# Patient Record
Sex: Male | Born: 2000 | Race: White | Hispanic: No | Marital: Single | Smoking: Never smoker
Health system: Southern US, Community
[De-identification: ages and names within clinical notes are randomized; demographics above are authoritative.]

## PROBLEM LIST (undated history)

## (undated) ENCOUNTER — Emergency Department: Admission: EM

## (undated) DIAGNOSIS — R404 Transient alteration of awareness: Secondary | ICD-10-CM

## (undated) DIAGNOSIS — R569 Unspecified convulsions: Secondary | ICD-10-CM

## (undated) DIAGNOSIS — Z8489 Family history of other specified conditions: Secondary | ICD-10-CM

## (undated) DIAGNOSIS — M419 Scoliosis, unspecified: Secondary | ICD-10-CM

## (undated) DIAGNOSIS — F840 Autistic disorder: Secondary | ICD-10-CM

## (undated) DIAGNOSIS — G7100 Muscular dystrophy, unspecified: Secondary | ICD-10-CM

## (undated) DIAGNOSIS — A084 Viral intestinal infection, unspecified: Secondary | ICD-10-CM

## (undated) HISTORY — DX: Viral intestinal infection, unspecified: A08.4

## (undated) HISTORY — DX: Unspecified convulsions: R56.9

## (undated) HISTORY — DX: Transient alteration of awareness: R40.4

## (undated) HISTORY — PX: SPINE SURGERY: SHX786

## (undated) HISTORY — PX: CIRCUMCISION: SUR203

---
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
## 2020-01-28 ENCOUNTER — Telehealth (INDEPENDENT_AMBULATORY_CARE_PROVIDER_SITE_OTHER): Payer: Self-pay | Admitting: Pediatrics

## 2020-01-28 NOTE — Telephone Encounter (Signed)
Who's calling (name and relationship to patient) : Edelmiro Innocent (mom)  Best contact number: [phone]  Provider they see: [name]  Reason for call:  Mom called in stating that a letter needed to be filled out and signed by [name] herself to excuse Madison from in person schooling due to him being unable to wear a mask. Per school has to come from [place]. Please fax when completed to [employer] at [phone]  Call ID:      [place]  Name of prescription:  Pharmacy:

## 2020-01-28 NOTE — Telephone Encounter (Signed)
There is no medical indication that prevents him from wearing a mask, but he may behaviorally not tolerate it.  Please call mother to clarify why he is unable to wear a mask. He is high risk for complications from COVID, I recommend he get the COVID vaccine.  If there are further questions, I can speak with mother directly.   Carylon Perches MD MPH

## 2020-01-29 ENCOUNTER — Encounter (INDEPENDENT_AMBULATORY_CARE_PROVIDER_SITE_OTHER): Payer: Self-pay

## 2020-01-29 NOTE — Telephone Encounter (Signed)
I returned call as Judson Roch is out today.  No answer at best contact number, mailbox is full and will not accept messages.  I called other number in demographic information, no answer.  I left a message to please call us back with more information on why he can not wear a mask.    Carylon Perches MD MPH

## 2020-01-31 ENCOUNTER — Encounter (INDEPENDENT_AMBULATORY_CARE_PROVIDER_SITE_OTHER): Payer: Self-pay

## 2020-02-03 NOTE — Telephone Encounter (Signed)
Message sent to mother via mychart regarding this issue.     Lucas Perches MD MPH

## 2020-02-06 ENCOUNTER — Encounter (INDEPENDENT_AMBULATORY_CARE_PROVIDER_SITE_OTHER): Payer: Self-pay

## 2020-02-07 ENCOUNTER — Telehealth (INDEPENDENT_AMBULATORY_CARE_PROVIDER_SITE_OTHER): Payer: Self-pay

## 2020-02-07 ENCOUNTER — Encounter (INDEPENDENT_AMBULATORY_CARE_PROVIDER_SITE_OTHER): Payer: Self-pay

## 2020-02-07 NOTE — Telephone Encounter (Signed)
Return call to [name] from [facility]. She reports she does not need letter for homebound just a letter stating that due to Tidus's developmental delays he cannot understand to leave his mask on while at school. She also reports to include he has an increased risk of complications if he gets the Covid 19 virus due to his muscular dystrophy. It needs to be faxed to Katie attention Pollyann Glen his teacher.  Mom reports she hopes to be able to get the vaccine herself this summer. RN advised due to the fact that she is his caregiver she should be able to get the vaccine now. [name] also mentioned he should be able to get it as well since he is now 30 and has a complex medical history. Mom will contact health dept to find out if they can. She denies receiving a call about getting xray taken. RN will re-enter order and call them to schedule. And notify mom. Mom agrees with plan.

## 2020-02-10 ENCOUNTER — Telehealth (INDEPENDENT_AMBULATORY_CARE_PROVIDER_SITE_OTHER): Payer: Self-pay | Admitting: Pediatrics

## 2020-02-10 NOTE — Telephone Encounter (Signed)
Yes that is fine

## 2020-02-10 NOTE — Telephone Encounter (Signed)
Called and left a message approving the authorization

## 2020-02-10 NOTE — Telephone Encounter (Signed)
  Who's calling (name and relationship to patient) : [facility]   Best contact number: [phone]  Provider they see: Dr. Gaynell Face  Reason for call: Pharmacy called to receive an authorization for a change in the Rx carbamazepine manufacturer. Fax authorization to [phone]    PRESCRIPTION REFILL ONLY  Name of prescription: Carbamazepine  Pharmacy: [facility] [employer].

## 2020-02-18 ENCOUNTER — Telehealth (INDEPENDENT_AMBULATORY_CARE_PROVIDER_SITE_OTHER): Payer: Self-pay

## 2020-02-18 ENCOUNTER — Encounter (INDEPENDENT_AMBULATORY_CARE_PROVIDER_SITE_OTHER): Payer: Self-pay

## 2020-02-18 DIAGNOSIS — M4145 Neuromuscular scoliosis, thoracolumbar region: Secondary | ICD-10-CM

## 2020-02-18 DIAGNOSIS — G7101 Duchenne or Becker muscular dystrophy: Secondary | ICD-10-CM

## 2020-02-18 DIAGNOSIS — M4120 Other idiopathic scoliosis, site unspecified: Secondary | ICD-10-CM

## 2020-02-18 DIAGNOSIS — F840 Autistic disorder: Secondary | ICD-10-CM

## 2020-02-18 NOTE — Telephone Encounter (Signed)
Left message for Lucas [name] to determine if she would rather his spinal Xrays be at [facility] on [address] or [facility] on [address] [place], [place], [place] [address]

## 2020-02-26 NOTE — Progress Notes (Shared)
Patient: Lucas [name] MRN: [id number] Sex: male DOB: 10-Mar-2001  Provider: [name], MD Location of Care: Pediatric Specialist- Pediatric Complex Care Note type: Routine return visit  History of Present Illness: Referral Source: *** History from: patient and prior records Chief Complaint: ***  Lucas [name] is a 19 y.o. 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 Patient was last seen on 10/14/2019 where his carbamazepine and also for Diastat were continued  .  Since that appointment, patient has seen Cardiology and Pulmonology.   Patient presents today with {CHL AMB PARENT/GUARDIAN:210130214} They report their largest concern is ***  Symptom management:     Care coordination (other providers):  Care management needs:   Equipment needs:   Decision making/Advanced care planning:  Diagnostics/Patient history:   Review of Systems: {cn system review:210120003}  Past Medical History Past Medical History:  Diagnosis Date  . Autism   . Autism   . Family history of adverse reaction to anesthesia    MGGM- N/V  . Muscular dystrophy (HCC)   . Scoliosis   . Seizure (HCC)   . Seizure (HCC)   . Seizures (HCC)    Last one 2013    Surgical History Past Surgical History:  Procedure Laterality Date  . CIRCUMCISION     at birth  . LAPAROSCOPIC GASTROSTOMY N/A 05/24/2017   Procedure: LAPAROSCOPIC GASTROSTOMY TUBE PLACEMENT;  Surgeon: Kandice Hams, MD;  Location: [name];  Service: General;  Laterality: N/A;    Family History family history includes Alcohol abuse in his father; Asthma in an other family member; Cancer in his paternal grandfather; Dementia in his paternal grandmother; Heart failure in his maternal  grandmother; Hyperlipidemia in his mother and another family member; Hypertension in his maternal grandmother and another family member; Learning disabilities in his mother; Other in his maternal aunt, maternal grandmother, maternal uncle, and mother; Vision loss in an other family member.   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 His private duty nurse attends school with him.   He attends [employer].    He lives with mother.    Allergies Allergies  Allergen Reactions  . Penicillins Hives and Rash    Has patient had a PCN reaction causing immediate rash, facial/tongue/throat swelling, SOB or lightheadedness with hypotension: Yes Has patient had a PCN reaction causing severe rash involving mucus membranes or skin necrosis: Yes Has patient had a PCN reaction that required hospitalization: No Has patient had a PCN reaction occurring within the last 10 years: No If all of the above answers are "NO", then may proceed with Cephalosporin use.   Marland Kitchen Nexium [Esomeprazole Magnesium] Nausea And Vomiting  . Calcitonin Rash  . Omeprazole Other (See Comments)    Constipation and possible rash after drug was stopped  . Vitamin D Analogs Other (See Comments)    Excessive urine output and thristy    Medications Current Outpatient Medications on File Prior to Visit  Medication Sig Dispense Refill  . acetaminophen (TYLENOL) 160 MG/5ML elixir Take 15 mg/kg by mouth every 4 (four) hours as needed for fever.    Marland Kitchen albuterol (PROVENTIL) (2.5 MG/3ML) 0.083% nebulizer solution Inhale into the lungs.    . calcium-vitamin D (OSCAL 500/200 D-3) 500-200 MG-UNIT tablet  Take by mouth.    . carbamazepine (TEGRETOL) 100 MG chewable tablet TAKE 1 & 1/2 TABLETS BY MOUTH TWICE DAILY 90 tablet 5  . diazepam (DIASTAT ACUDIAL) 10 MG GEL Give 10 mg rectally after 2 minutes of seizures.  May repeat in 10 minutes 1 Package 5  . diphenhydrAMINE (BENADRYL) 12.5  MG/5ML elixir 5 -10 ml po as needed for hives 3 times a day    . feeding supplement, PEDIASURE 1.0 CAL WITH FIBER, (PEDIASURE ENTERAL FORMULA 1.0 CAL WITH FIBER) LIQD Place 237 mLs into feeding tube 4 (four) times daily. If he has oral intake at school do not give a 4 pm feeding only give the water flushes if no oral intake give 4 feedings a day 155 mL 5  . hydrocortisone 2.5 % cream Apply topically.    Marland Kitchen loratadine (CHILDRENS LORATADINE) 5 MG/5ML syrup Take by mouth.    . Melatonin 5 MG TABS Take 7.5 mg by mouth at bedtime.    . Multiple Vitamin (MULTIVITAMIN) LIQD Take 30 mLs by mouth daily. 14ml Tropical oasis mega plus    . Polyethylene Glycol 3350 (PEG 3350) POWD TAKE 17 G BY MOUTH ONCE FOR 1 DOSE. MIX IN 4-8OUNCES OF FLUID PRIOR TO TAKING.    . prednisoLONE (PRELONE) 15 MG/5ML SOLN Take 9 mg by mouth daily before breakfast.    . senna (SENOKOT) 176 MG/5ML SYRP Give 5-10 ml by G tube every day to every other day to produce soft stool daily If no stool in 3 days give enema 300 mL 3   No current facility-administered medications on file prior to visit.   The medication list was reviewed and reconciled. All changes or newly prescribed medications were explained.  A complete medication list was provided to the patient/caregiver.  Physical Exam There were no vitals taken for this visit. Weight for age: No weight on file for this encounter.  Length for age: No height on file for this encounter. BMI: There is no height or weight on file to calculate BMI. No exam data present   Diagnosis: No diagnosis found.   Assessment and Plan Lucas [name] is a 19 y.o. male with history of ***who presents for follow-up in the pediatric complex care clinic.  Patient seen by case manager, dietician, integrated behavioral health today as well, please see accompanying notes.  I discussed case with all involved parties for coordination of care and recommend patient follow their instructions as below.    Symptom management:     Care coordination:  Care management needs:   Equipment needs:   Decision making/Advanced care planning:  The CARE PLAN for reviewed and revised to represent the changes above.  This is available in Epic under snapshot, and a physical binder provided to the patient, that can be used for anyone providing care for the patient.     No follow-ups on file.  Carylon Perches MD MPH Neurology,  Neurodevelopment and Neuropalliative care [facility] Child Neurology  [place], [place], [place] [address] Phone: [phone]  By signing below, I, Trina Ao attest that this documentation has been prepared under the direction of Carylon Perches, MD.   I, Carylon Perches, MD personally performed the services described in this documentation. All medical record entries made by the scribe were at my direction. I have reviewed the chart and agree that the record reflects my personal performance and is accurate and complete Electronically signed by Trina Ao and Carylon Perches, MD 3/18 /2021 ***

## 2020-02-26 NOTE — Progress Notes (Deleted)
   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 - [facility] DME: [facility] Pertinent medical hx: Duchenne musclar dystrophy, neuromuscular scoliosis, autism, seizures, FTT, +Gtube  Assessment: Food allergies: none Pertinent Medications: see medication list Vitamins/Supplements: Buiced liquid MVI Pertinent labs: no recent labs in Epic  (3/18) Anthropometrics: The child was weighed, measured, and plotted on the CDC growth chart. Ht: *** cm (*** %)  Z-score: *** Wt: *** kg (*** %)  Z-score: *** BMI: *** (*** %)  Z-score: ***  (7/3) Anthropometrics per Epic from outside source: The child was weighed, measured, and plotted on the CDC growth chart. Wt: 42.1 kg (0.01 %)  Z-score: -3.71  (3/20) Wt: 42.5 kg (2/17) Wt: 43.3 kg (11/21) Wt: 42.2 kg  Estimated minimum caloric needs: 25*** kcal/kg/day (based on current regimen) Estimated minimum protein needs: 0.85 g/kg/day (DRI) Estimated minimum fluid needs: 46*** mL/kg/day (Holliday Segar)  Primary concerns today: Follow-up for Gtube dependence. *** accompanied pt to appt today.  Dietary Intake Hx: Usual feeding regimen: Pediasure 1.0 + fiber - 4 cans daily with and additional 1000 mL free water  7-8 AM - 237 mL @ 237 mL/hr  10-11 AM - 237 mL @ 237 mL/hr  1 PM - 200 mL water  4-5 PM - 237 mL @ 237 mL/hr  7-8 PM - 237 mL @ 237 mL/hr  FWF: 100 mL before and after each feed (800 mL total) PO foods: none right now  GI: no vomiting, some spit up for attention (giggles), daily BM  Physical Activity: wheel-chair bound  Estimated caloric intake: *** kcal/kg/day - meets ***% of estimated needs Estimated protein intake: *** g/kg/day - meets ***% of estimated needs Estimated fluid intake: *** mL/kg/day - meets ***% of estimated needs Micronutrient intake: ***  Nutrition Diagnosis: (9/10) Inadequate oral intake related to medical status  and dysphagia as evidence by pt dependent on Gtube to meet nutritional needs.  Intervention: Discussed current regimen and weight. Mom reports pt is currently not consuming anything PO as he is not in school. She also reports thinking his weight looks stable and she has no concerns about weight loss. Discussed continuing current regimen for now and adjusting at next appt with updated anthros. Discussed MVI, PDN orders are for PRN so needs to be updated to daily. All questions answered, mom in agreement with plan. Recommendations: - Continue current regimen. - Monitor Toni's weight and let me know if it looks like he is losing weight. - We'll plan for an updated height and weight at next appointment. - [facility] has to send [place] a form to change his multivitamin to daily. Please reach out to them and let them know.  Teach back method used.  Monitoring/Evaluation: Goals to Monitor: - Wt trends - TF tolerance - Ability to increase protein or need to switch to lower calorie formula.  Follow-up ***.  Total time spent in counseling: *** minutes.

## 2020-02-27 ENCOUNTER — Encounter (INDEPENDENT_AMBULATORY_CARE_PROVIDER_SITE_OTHER): Payer: Self-pay

## 2020-02-27 ENCOUNTER — Ambulatory Visit (INDEPENDENT_AMBULATORY_CARE_PROVIDER_SITE_OTHER): Payer: Medicaid Other | Admitting: Pediatrics

## 2020-02-27 ENCOUNTER — Ambulatory Visit (INDEPENDENT_AMBULATORY_CARE_PROVIDER_SITE_OTHER): Payer: Medicaid Other | Admitting: Dietician

## 2020-02-28 ENCOUNTER — Telehealth (INDEPENDENT_AMBULATORY_CARE_PROVIDER_SITE_OTHER): Payer: Self-pay | Admitting: Dietician

## 2020-02-28 NOTE — Telephone Encounter (Signed)
  Who's calling (name and relationship to patient) : Olivia Mackie Ibbotson mom   Best contact number: [phone]  Provider they see: Jean Rosenthal  Reason for call: Mom called trying to move an appoint that is schedule for Monday as she has no transportation. Virtual visit was offered but she wasn't sure if Chavis needed to be seen in person or not. Please inform whether this is acceptable or not. Mom can only have transportation on Tuesday and Thursday mornings when [name] is in [facility] or [facility] appts.     PRESCRIPTION REFILL ONLY  Name of prescription:  Pharmacy:

## 2020-03-06 NOTE — Telephone Encounter (Signed)
Call to nursing agency to request they confirm that mom received the voice message and my chart messages RN has sent- My chart messages are returning unread. Advised about X-ray at the [facility] in [place], that she and Ryman should be able to receive the Covid vaccine now, and that he has an inpatient visit in our office on 4/8- she agrees will pass information to mom.

## 2020-03-16 NOTE — Progress Notes (Deleted)
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   Neck: Supple, no meningismus. No focal tenderness.  Resp: normal work of breathing.   CV: well perfused-05/11/18 - normal biventricular size and function  Abd: Abdomen non-distended. G-tube c/d/i. No hepatosplenomegaly or mass, history of reflux and constipation  Ext: No deformities, mod muscle wasting, ROM full.  MS: Awake, alert.  Nonverbal, but interactive with mother. Smiles responsively and appears to follow simple commands or to understand at times.    Cranial Nerves: No nystagmus; no ptsosis, face symmetric with full strength of facial muscles, hearing grossly intact, palate elevation is symmetric.  Motor- Able to move arms at least antigravity. No abnormal movements  Sensation: Responds to touch in all extremities.   Coordination: Does not reach for objects.   Gait: wheelchair dependent.    Neurologic - severe generalized weakness with wheelchair dependence, significant intellectual deficit, no language, dependent upon caregivers for all ADL's  Pulmonary - has sleep apnea, uses CPAP at night  GI - has dysphagia with gastrostomy tube dependence (14 fr 2.0 cm Mini-one) and purees or Gerber pouches by mouth for pleasure  Incontinent of stool and urine  Guardians/Caregivers: Lucas [name] (mother) ph [phone] [facility] nursing  Recent Events:  Upcoming Plans:   2 view Scoliosis X-rays for Dr. Joaquim [name] at [facility]- order faxed to [facility]  Needs follow up visit with Dr. [employer] [name] and follow up sleep study this summer  Cardiology follow-up 12-18 months, last 12/20 [facility] office [phone]; [place] office [phone])   Feeding: DME: [facility] - fax [phone] Formula: Pediasure 1.0 + Fiber - 4 cans daily Current regimen:  Day feeds: 237 mL @ 237 mL/hr x 4 feeds @ 7:30 AM, 10 AM, 4 PM, and 7 PM Overnight feeds: none  FWF: 100 mL before and after each feed (800 mL), 200 mL flush at 1 PM  Notes: no PO currently Supplements: Buiced liquid MVI  Past/failed meds:  Mom feels reflux medications caused vomiting .DOES NOT TOLERATE any reflux meds  Intolerant of Ca and Vitamin D supplements  Providers:  Lucas Late, MD,  ([facility]) ph [phone] fax [phone]  Lucas Jump, MD [facility] cardiology) ph [phone] fax [phone]-- upcoming  Lucas Crook, MD [facility] orthopedics) ph [phone] fax [phone]  Lucas Shell, MD [facility] pulmonology) ph [phone] fax [phone]  Lucas Gobble, MD (Neurology/Muscular dystrophy clinic at [facility]) ph [phone] fax [phone]  Lucas [name], [place] [facility] Dentistry) ph [phone] fax [phone]  Lucas [name], [place] ([facility] dietitian) ph [phone] fax [phone]  Lucas Patrick, MD (Pediatric Pulmonology at [facility]) [phone] Fax: [phone]  Community support/services:  Lucas [name] Physical Therapy ph [phone] Lucas [name], [place] Has PDN 84 hours per week Has respite hours - [place] [facility] Case Manager - [place]- ph [phone], fax [phone]  CAP/C  [place], RN -[phone]  CAP-C  ph [phone] fax [phone]  Lucas [name] and Lucas [name] (cousins)    Grandmother's old church members call sometimes.  Nurse interested in getting into rec center.    MDA society not helpful to mom  Equipment:  [place]- [phone] CPAP at 11,Nebulizer, [place] and Mobility-Lucas [name] [phone] [facility] lift, Sleep safe bed, Wheelchair new one ordered 05/2019, [place] chair may need knew one, car seat   [place]-Respiratory vest,Cough assist machine- BID- QID  [place] ([facility])-ph: [phone] fax: [phone] Incontinence supplies, Formula, Kangaroo, G-tube Mini-one Pediasure with Fiber 1.0, (contact - "Lucas [name]")  **Needs a van.  Currently lifting him into seat, then pushing wheelchair the van by a ramp.**  Goals of care:  do remote learning for safety  Advance care planning: Full Code    Psychosocial:  Father is not involved with the patient. Mom has cousin that helps with Lucas [name] care at times - [place], as well as their daughter Lucas [name].   Lives with mom and Attends [employer]  Diagnostics/Screenings:  EEG showed right central diphasic sharply contoured slow-wave activity.   MRI of the brain failed to show a structural abnormality.  Autism was diagnosed at age 45  Duchenne muscular dystrophy was diagnosed at age 44.  Polysomnogram October 2018 at [facility] - obstructive sleep apnea  EKG 05/11/18 - normal sinus rhythm with normal PR, QRS and corrected QT interval  Echocardiogram 05/11/18 - normal biventricular size and function  11/28/19 Echo- ECG: normal rate and rhythm, hear rate 95/minute is actually a bit lower than last 2 ECG's, favorable.Echocardiogram: normal chamber size, wall thickness, squeezing.  Rockwell Germany NP-C and Carylon Perches, MD Pediatric Complex Care Program Ph. [phone] Fax [phone]

## 2020-03-18 ENCOUNTER — Telehealth (INDEPENDENT_AMBULATORY_CARE_PROVIDER_SITE_OTHER): Payer: Self-pay | Admitting: Family

## 2020-03-18 NOTE — Telephone Encounter (Signed)
  Who's calling (name and relationship to patient) : Lucas [name] - mother   Best contact number: [phone]  Provider they see: Rockwell Germany / Dr Rogers Blocker   Reason for call: Mom called to speak with provider or nutrition about [name]'s pediasure. He is getting one can of Pediasure and stating he is still hungry after finishing one can. Please advise if the cans can be increased.     PRESCRIPTION REFILL ONLY  Name of prescription:  Pharmacy:

## 2020-03-19 ENCOUNTER — Encounter (INDEPENDENT_AMBULATORY_CARE_PROVIDER_SITE_OTHER): Payer: Self-pay | Admitting: Family

## 2020-03-19 ENCOUNTER — Ambulatory Visit (INDEPENDENT_AMBULATORY_CARE_PROVIDER_SITE_OTHER): Payer: Medicaid Other

## 2020-03-19 ENCOUNTER — Ambulatory Visit (INDEPENDENT_AMBULATORY_CARE_PROVIDER_SITE_OTHER): Payer: Medicaid Other | Admitting: Dietician

## 2020-03-19 ENCOUNTER — Ambulatory Visit (INDEPENDENT_AMBULATORY_CARE_PROVIDER_SITE_OTHER): Payer: Medicaid Other | Admitting: Family

## 2020-03-19 ENCOUNTER — Other Ambulatory Visit: Payer: Self-pay

## 2020-03-19 VITALS — BP 108/64 | HR 104 | Wt 86.8 lb

## 2020-03-19 VITALS — Ht 58.78 in

## 2020-03-19 DIAGNOSIS — G40209 Localization-related (focal) (partial) symptomatic epilepsy and epileptic syndromes with complex partial seizures, not intractable, without status epilepticus: Secondary | ICD-10-CM

## 2020-03-19 DIAGNOSIS — G7100 Muscular dystrophy, unspecified: Secondary | ICD-10-CM

## 2020-03-19 DIAGNOSIS — J984 Other disorders of lung: Secondary | ICD-10-CM

## 2020-03-19 DIAGNOSIS — Z7189 Other specified counseling: Secondary | ICD-10-CM

## 2020-03-19 DIAGNOSIS — H1013 Acute atopic conjunctivitis, bilateral: Secondary | ICD-10-CM

## 2020-03-19 DIAGNOSIS — Z931 Gastrostomy status: Secondary | ICD-10-CM | POA: Diagnosis not present

## 2020-03-19 DIAGNOSIS — G7101 Duchenne or Becker muscular dystrophy: Secondary | ICD-10-CM | POA: Diagnosis not present

## 2020-03-19 DIAGNOSIS — G4733 Obstructive sleep apnea (adult) (pediatric): Secondary | ICD-10-CM

## 2020-03-19 DIAGNOSIS — F840 Autistic disorder: Secondary | ICD-10-CM

## 2020-03-19 DIAGNOSIS — M4145 Neuromuscular scoliosis, thoracolumbar region: Secondary | ICD-10-CM

## 2020-03-19 DIAGNOSIS — R569 Unspecified convulsions: Secondary | ICD-10-CM

## 2020-03-19 DIAGNOSIS — R339 Retention of urine, unspecified: Secondary | ICD-10-CM

## 2020-03-19 MED ORDER — OLOPATADINE HCL 0.1 % OP SOLN: 1.0000 [drp] | Freq: Two times a day (BID) | OPHTHALMIC | 5 refills | Status: DC

## 2020-03-19 MED ORDER — CROMOLYN SODIUM 4 % OP SOLN: 1.0000 [drp] | Freq: Four times a day (QID) | OPHTHALMIC | 3 refills | Status: DC | PRN

## 2020-03-19 NOTE — Progress Notes (Signed)
Patient: Lucas [name] MRN: [id number] Sex: male DOB: 03/14/2001  Provider: [name], NP Location of Care: [facility]  Note type: Routine return visit  History of Present Illness: Referral Source: Myrtice Lauth, MD History from: [facility] chart and his mother and his private duty nurse Chief Complaint: Complex Care follow up  Lucas [name] is a 19 y.o. who is followed by the [facility] for evaluation and care management of multiple medical conditions. He is cared for at home by hismother and private duty nurses. 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  Mom and his nurse report today that Lucas [name] has been having problems with seasonal allergies and with reddened, irritated eyes in particular. They asked about eye drops to give him relief from symptoms.   Mom reports that Lucas [name] asks for food after he has received a feeding and wonders about giving him oral feedings in addition to tube feedings. Interestingly, he does not ask for food when he is fed by the nurse. The nurse also notes that he is easily distracted from his request when Mom feeds him and wonders if he is asking for food simply to agitate Mom.   They report that Lucas [name] is sleeping better now that he is on a more regular schedule. He has received a new wheelchair since his last visit, and they feel that he is more comfortable in the chair than in the past. They said that he has bilateral hand splints but that he doesn't like wearing them so they left them off for the visit today.   Lucas [name] has repaired neuromuscular scoliosis and his orthopedist at [facility] has requested spinal films to follow up on his  condition. Mom has refused to take him to the radiology facility because of fear of Covid infection. He is scheduled to receive the Covid 19 vaccine soon.   Lucas [name] has been otherwise generally healthy and Mom has no other health concerns for Lucas [name] today other than previously mentioned.  Review of Systems: Please see the HPI for neurologic and other pertinent review of systems. Otherwise all other systems were reviewed and are negative.    Past Medical History:  Diagnosis Date  . Autism   . Autism   . Family history of adverse reaction to anesthesia    MGGM- N/V  . Muscular dystrophy (HCC)   . Scoliosis   . Seizure (HCC)   . Seizure (HCC)   . Seizures (HCC)    Last one 2013   Immunizations up to date: Yes.    Past Medical History Comments: See HPI Copied from previous record: His last seizure occurred in late April or May 2013, and was an episode of status epilepticus. EEG showed right central diphasic sharply contoured slow-wave activity. MRI of the brain failed to show a structural abnormality. He has been seizure-free since that time. Autism was diagnosed at age 64, diagnosis of Duchenne muscular dystrophy was made at age 21. He is wheelchair bound. He is unable to communicate.  Hospitalized due to constipation 06/08/14 until 06/11/14.  He had a split night polysomnogram on September 17, 2017, which showed significant sleep apnea with 2 episodes of obstructive apnea, 1 central apnea, and 8 hypopneas. Most of his night was spent awake. He also had light natural sleep and  deep sleep but no rapid eye movement sleep. He showed significant improvement in his episodes of apnea with CPAP pressures of 4, 6, and 8. He did not sleep on the lower doses but did on the higher dose. He was tested with a large mask and a small one and tolerated the large mask better.  Birth History 6 bs. 13 oz. Infant born at full-term to a 52 year old primigravida.  Mother gained more than 25 pounds  and took medications other than vitamins and iron.  Labor lasted for 12 hours.  Normal spontaneous vaginal delivery.  The child may have had an infection in the nursery. Details are uncertain.  Growth and development was delayed for gross motor skills including pulling to stand and walking alone. He was also significantly delayed for his language.   Behavior History Autism spectrum disorder with intellectual disability and severe language disorder   Surgical History Past Surgical History:  Procedure Laterality Date  . CIRCUMCISION     at birth  . LAPAROSCOPIC GASTROSTOMY N/A 05/24/2017   Procedure: LAPAROSCOPIC GASTROSTOMY TUBE PLACEMENT;  Surgeon: Stanford Scotland, MD;  Location: [facility];  Service: General;  Laterality: N/A;    Family History family history includes Alcohol abuse in his father; Asthma in an other family member; Cancer in his paternal grandfather; Dementia in his paternal grandmother; Heart failure in his maternal grandmother; Hyperlipidemia in his mother and another family member; Hypertension in his maternal grandmother and another family member; Learning disabilities in his mother; Other in his maternal aunt, maternal grandmother, maternal uncle, and mother; Vision loss in an other family member. Family History is otherwise negative for migraines, seizures, cognitive impairment, blindness, deafness, birth defects, chromosomal disorder, autism.  Social History Social History   Socioeconomic History  . Marital status: Single    Spouse name: Not on file  . Number of children: Not on file  . Years of education: Not on file  . Highest education level: Not on file  Occupational History  . Not on file  Tobacco Use  . Smoking status: Passive Smoke Exposure - Never Smoker  . Smokeless tobacco: Never Used  Substance and Sexual Activity  . Alcohol use: No  . Drug use: No  . Sexual activity: Never  Other Topics Concern  . Not on file  Social History Narrative    Lucas [name] is a 11th grade student. His private duty nurse attends school with him.   He attends [employer].    He lives with mother.   Social Determinants of Health   Financial Resource Strain:   . Difficulty of Paying Living Expenses:   Food Insecurity:   . Worried About Charity fundraiser in the Last Year:   . Arboriculturist in the Last Year:   Transportation Needs:   . Film/video editor (Medical):   Marland Kitchen Lack of Transportation (Non-Medical):   Physical Activity:   . Days of Exercise per Week:   . Minutes of Exercise per Session:   Stress:   . Feeling of Stress :   Social Connections:   . Frequency of Communication with Friends and Family:   . Frequency of Social Gatherings with Friends and Family:   . Attends Religious Services:   . Active Member of Clubs or Organizations:   . Attends Archivist Meetings:   Marland Kitchen Marital Status:    Allergies Allergies  Allergen Reactions  . Penicillins Hives and Rash    Has patient had a PCN reaction  causing immediate rash, facial/tongue/throat swelling, SOB or lightheadedness with hypotension: Yes Has patient had a PCN reaction causing severe rash involving mucus membranes or skin necrosis: Yes Has patient had a PCN reaction that required hospitalization: No Has patient had a PCN reaction occurring within the last 10 years: No If all of the above answers are "NO", then may proceed with Cephalosporin use.   Marland Kitchen Nexium [Esomeprazole Magnesium] Nausea And Vomiting  . Calcitonin Rash  . Omeprazole Other (See Comments)    Constipation and possible rash after drug was stopped  . Vitamin D Analogs Other (See Comments)    Excessive urine output and thristy    Physical Exam BP 108/64   Pulse (!) 104   Wt 86 lb 12.8 oz (39.4 kg)  General: well developed, well nourished young man, seated in wheelchair, in no evident distress; brown hair, brown eyes, non handed Head: normocephalic and atraumatic. Oropharynx examination limited  due to his inability to cooperate with examination but appears benign. No dysmorphic features. Neck: supple Cardiovascular: regular rate and rhythm, no murmurs. Respiratory: Clear to auscultation bilaterally Abdomen: Bowel sounds present all four quadrants, abdomen soft, non-tender, non-distended. No hepatosplenomegaly or masses palpated. Low profile gastrostomy button in place size 66F 2.3cm. Musculoskeletal: No skeletal deformities. Has neuromuscular scoliosis. Has contractures in the ankles with equinus deformity, decreased range of motion at the hips, knees and elbows Skin: no rashes or neurocutaneous lesions  Neurologic Exam Mental Status: Awake and fully alert. Has no language.  Smiles responsively. Unable to follow commands but is tolerant to invasions into his space Cranial Nerves: Fundoscopic exam - red reflex present.  Unable to fully visualize fundus.  Pupils equal briskly reactive to light.  Turns to localize faces and objects in the periphery. Turns to localize sounds in the periphery. Facial movements are asymmetric, has lower facial weakness with drooling.  Neck flexion and extension abnormal with poor head control.  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 Does not reach for objects. Gait and Station: Unable to stand and bear weight.  Reflexes: Not examined due to his inability to cooperate with examination  Impression 1. Duchenne muscular dystrophy 2. Autism spectrum disorder requiring substantial support 3. Partial epilepsy with impairment of consciousness 4. Neuromuscular scoliosis, repaired 5. Expressive and receptive language delay 6. Significant intellectual delay 7. Urinary and bowel incontinence 8. Dysphagia requiring g-tube for nourishment and medications 9. Sleep disorder and obstructive sleep  apnea  Recommendations for plan of care The patient's previous [facility] records were reviewed. Lucas [name] is a 19 y.o. 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 He also has seasonal allergies and is having problems with red and irritated eyes. I recommended using an eye drop such as Pataday and explained to Mom how to administer the medication. We talked about his habit of asking for food from his mother and I recommended that she discuss that with the dietician today. I encouraged Mom to take Lucas [name] to get the spinal xrays requested by his orthopedist and she agreed to do so. We talked about this hand splints and I encouraged Mom to work with Lucas [name] to wear the hand splints every day. Finally, we talked about the Covid  19 vaccine and I encouraged Mom to allow Lucas [name] to have the vaccine when possible.  I will otherwise see Lucas [name] back in follow up in 3 months or sooner if needed. Mom agreed with the plans made today.    The medication list was reviewed and reconciled. I reviewed changes that were made in the prescribed medications today.  A complete medication list was provided to his mother.  Allergies as of 03/19/2020      Reactions   Penicillins Hives, Rash   Has patient had a PCN reaction causing immediate rash, facial/tongue/throat swelling, SOB or lightheadedness with hypotension: Yes Has patient had a PCN reaction causing severe rash involving mucus membranes or skin necrosis: Yes Has patient had a PCN reaction that required hospitalization: No Has patient had a PCN reaction occurring within the last 10 years: No If all of the above answers are "NO", then may proceed with Cephalosporin use.    Calcitonin Rash   Esomeprazole Magnesium Nausea And Vomiting   Omeprazole Other (See Comments)   Constipation and possible rash after drug was stopped   Vitamin D Analogs Other (See Comments)   Excessive urine output and thristy      Medication List       Accurate as of March 19, 2020 11:59 PM. If you have any questions, ask your nurse or doctor.        STOP taking these medications   Childrens Loratadine 5 MG/5ML syrup Generic drug: loratadine Stopped by: Lucas Germany, NP   multivitamin Liqd Stopped by: Lucas Germany, NP   Oscal 500/200 D-3 500-200 MG-UNIT tablet Generic drug: calcium-vitamin D Stopped by: Lucas Germany, NP     TAKE these medications   acetaminophen 160 MG/5ML elixir Commonly known as: TYLENOL Take 15 mg/kg by mouth every 4 (four) hours as needed for fever.   albuterol (2.5 MG/3ML) 0.083% nebulizer solution Commonly known as: PROVENTIL Inhale into the lungs.   carbamazepine 100 MG chewable tablet Commonly known as: TEGRETOL TAKE 1 & 1/2 TABLETS BY MOUTH TWICE DAILY   cromolyn 4 % ophthalmic solution Commonly known as: OPTICROM Place 1 drop into both eyes 4 (four) times daily as needed. Started by: Lucas Germany, NP   diazepam 10 MG Gel Commonly known as: DIASTAT ACUDIAL Give 10 mg rectally after 2 minutes of seizures.  May repeat in 10 minutes   diphenhydrAMINE 12.5 MG/5ML elixir Commonly known as: BENADRYL 5 -10 ml po as needed for hives 3 times a day   feeding supplement (PEDIASURE 1.0 CAL WITH FIBER) Liqd Place 237 mLs into feeding tube 4 (four) times daily. If he has oral intake at school do not give a 4 pm feeding only give the water flushes if no oral intake give 4 feedings a day What changed:   when to take this  additional instructions   melatonin 5 MG Tabs Take 7.5 mg by mouth at bedtime.   PEG 3350 17 GM/SCOOP Powd TAKE 17 G BY MOUTH ONCE FOR 1 DOSE. MIX IN 4-8OUNCES OF FLUID PRIOR TO TAKING.   prednisoLONE 15  MG/5ML Soln Commonly known as: PRELONE Take 9 mg by mouth daily before breakfast.   senna 176 MG/5ML Syrp Commonly known as: SENOKOT Give 5-10 ml by G tube every day to every other day to produce soft stool daily If no stool in 3 days give enema       [name] was consulted regarding this patient.   Total time spent with the patient was 30 minutes,  of which 50% or more was spent in counseling and coordination of care.   Lucas Germany NP-C

## 2020-03-19 NOTE — Patient Instructions (Signed)
Thank you for coming in today.   Instructions for you until your next appointment are as follows: 1. I sent a prescription to the pharmacy for eye drops for when Lucas [name] has eye redness from seasonal allergies. Place 1 drop into each eye twice per day 2. Be sure to get the spine x-rays done in the next couple of weeks as we discussed today.  3. Follow the dietician's instructions for his feedings.  4. Try to get Lucas [name] to wear the hand splints most of the day.  5.  Please plan to return for follow up in 3 months or sooner if needed.

## 2020-03-19 NOTE — Patient Instructions (Addendum)
-   Continue 5 feeds daily with 100 mL water flushes before and after each feed. - Offer Magic pouches after feeds if he says he is hungry. - Continue monitoring weights.

## 2020-03-19 NOTE — Progress Notes (Signed)
Scheduled follow up with Dr. [place] Cellar for 4/16 at 4 pm.  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   Neck: Supple, no meningismus. No focal tenderness.  Resp: normal work of breathing.   CV: well perfused-05/11/18 - normal biventricular size and function  Abd: Abdomen non-distended. G-tube c/d/i. No hepatosplenomegaly or mass, history of reflux and constipation  Ext: No deformities, mod muscle wasting, ROM full.  MS: Awake, alert.  Nonverbal, but interactive with mother. Smiles responsively and appears to follow simple commands or to understand at times.    Cranial Nerves: No nystagmus; no ptsosis, face symmetric with full strength of facial muscles, hearing grossly intact, palate elevation is symmetric.  Motor- Able to move arms at least antigravity. No abnormal movements  Sensation: Responds to touch in all extremities.   Coordination: Does not reach for objects.   Gait: wheelchair dependent.    Neurologic - severe generalized weakness with wheelchair dependence, significant intellectual deficit, no language, dependent upon caregivers for all ADL's  Pulmonary - has sleep apnea, uses CPAP at night  GI - has dysphagia with gastrostomy tube dependence (14 fr 2.0 cm Mini-one) and purees or Gerber pouches by mouth for  pleasure  Incontinent of stool and urine  Guardians/Caregivers: Daryon Remmert (mother) ph [phone] [facility]  Recent Events:  Upcoming Plans:   2 view Scoliosis X-rays for Dr. Joaquim Lai at [facility]- order faxed to [place]- mom plans to take him for x-rays when she sees her doctor for hand problem  Needs follow up visit with Dr. [place] Cellar and follow up sleep study this summer  Cardiology follow-up 12-18 months, last 12/20 [facility] office [phone]; [place] office [phone])   Feeding: DME: [facility] - fax [phone] Formula: Pediasure 1.0 + Fiber - 5 cans daily  Current regimen:  Day feeds: 237 mL @ 237 mL/hr x 4 feeds @ 7 AM, 10 AM, 1 PM 4 PM, and 7 PM Overnight feeds: none  FWF: 100 mL before and after each feed (800 mL), 200 ml total at each feeding  Notes: offer a pouch of baby food if he says he is hungry Supplements:   Past/failed meds:  Mom feels reflux medications caused vomiting .DOES NOT TOLERATE any reflux meds  Intolerant of Ca and Vitamin D supplements  Providers:  [name], MD,  ([place]) ph [phone] fax [phone]  [name], MD [facility] cardiology) ph [phone] fax [phone]-- upcoming  [name], MD [facility] orthopedics) ph [phone] fax [phone]  [name], MD [facility] pulmonology) ph [phone] fax [phone]  [name], MD (Neurology/Muscular dystrophy clinic at [facility]) ph [phone] fax [place], [address] [facility] Dentistry) ph [phone] fax [phone]  Lenise Arena, [place] ([facility] dietitian) ph [phone] fax [phone]  Pat Patrick, MD (Pediatric Pulmonology at [facility]) [phone] Fax: [phone]  Community support/services:  Sherri Rad  Physical Therapy ph [phone] Trixie Deis, PT- 1 x a month  [facility] Has PDN 84 hours per week Has respite hours - [place] Case Manager - Safford RN- ph [phone], fax [phone]  CAP/C  [facility] caseworker -Amy Baker with Daun Peacock, RN [phone]  CAP-C  ph [phone] fax [phone]  Dorian Pod and Earney Hamburg (cousins)    Grandmother's old church members call sometimes.  Nurse interested in getting into rec center.    MDA society not helpful to mom  Equipment:  [place]- [phone] CPAP at 11,Nebulizer, [place] and Mobility-Travis Minette Brine [phone] [facility] lift, Sleep safe bed, Wheelchair  05/2019, [place] chair may need knew one due to length  [place] Rom-Respiratory vest,Cough assist machine- BID- QID  [place] ([place])-ph: [phone] fax: [phone] Incontinence supplies, Formula, Kangaroo, G-tube Mini-one Pediasure with Fiber 1.0, (contact - "Jackie") 14 fr 2.3 cm Mini One button  **Needs a van.  Currently lifting him into seat, then pushing wheelchair the van by a ramp.**  Goals of care:  do remote learning for safety  Advance care planning: Full Code    Psychosocial:  Father is not involved with the patient. Mom has cousin that helps with [name] at times - [place], as well as their daughter [name].   Lives with mom and Attends [employer]  Diagnostics/Screenings:  EEG showed right central diphasic sharply contoured slow-wave activity.   MRI of the brain failed to show a structural abnormality.  Autism was diagnosed at age 17  Duchenne muscular dystrophy was diagnosed at age 21.  [facility]om[facility]ogram October 2018 at [facility] - obstructive sleep apnea  EKG 05/11/18 - normal sinus rhythm with normal PR, QRS and corrected QT interval  Echocardiogram 05/11/18 - normal biventricular size and function  11/28/19 Echo- ECG: normal rate and rhythm, hear rate 95/minute is actually a bit lower than last 2 ECG's, favorable.Echocardiogram: normal chamber size, wall thickness, squeezing.  Rockwell Germany  NP-C and Carylon Perches, MD [facility] Ph. [phone] Fax [phone]

## 2020-03-19 NOTE — Progress Notes (Signed)
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 - [facility] DME: [facility] Pertinent medical hx: Duchenne musclar dystrophy, neuromuscular scoliosis, autism, seizures, FTT, +Gtube  Assessment: Food allergies: none Pertinent Medications: see medication list Vitamins/Supplements: none Pertinent labs: no recent labs in Epic  (3/18) Anthropometrics: The child was weighed, measured, and plotted on the CDC growth chart. Ht: 149.3 cm (<0.01 %) Z-score: -3.73 Wt: 39.4 kg (<0.01 %)  Z-score: -4.70 BMI: 17.6 (1 %)  Z-score: -2.21  (7/3) Anthropometrics per Epic from outside source: The child was weighed, measured, and plotted on the CDC growth chart. Wt: 42.1 kg (0.01 %)  Z-score: -3.71  (3/20) Wt: 42.5 kg (2/17) Wt: 43.3 kg (11/21) Wt: 42.2 kg  Estimated minimum caloric needs: 30 kcal/kg/day (based on current regimen) Estimated minimum protein needs: 0.85 g/kg/day (DRI) Estimated minimum fluid needs: 47 mL/kg/day (Holliday Segar)  Primary concerns today: Follow-up for Gtube dependence. Mom and PDN nurse accompanied pt to appt today. Judson Roch, RN case manager present throughout appt.  Dietary Intake Hx: Usual feeding regimen: Pediasure 1.0 + fiber - 5 cans daily with and additional 1000 mL free water  7-8 AM - 237 mL @ 237 mL/hr  10-11 AM - 237 mL @ 237 mL/hr  1 PM - 237 mL @ 237 mL/hr  4-5 PM - 237 mL @ 237 mL/hr  7-8 PM - 237 mL @ 237 mL/hr  FWF: 100 mL before and after each feed (1000 mL total) PO foods: refuses  GI: spitting up sometimes but laughes, daily BM  Physical Activity: wheel-chair bound  Estimated caloric intake: 30 kcal/kg/day - meets 100% of estimated needs Estimated protein intake: 0.9 g/kg/day - meets 105% of estimated needs Estimated fluid intake: 50 mL/kg/day - meets 106% of estimated needs Micronutrient intake: Vitamin A 700 mcg  Vitamin C 115 mg   Vitamin D 30 mcg  Vitamin E 15 mg  Vitamin K 90 mcg  Vitamin B1 (thiamin) 1.5 mg  Vitamin B2 (riboflavin) 1.7 mg  Vitamin B3 (niacin) 16 mg  Vitamin B5 (pantothenic acid) 6.5 mg  Vitamin B6 1.7 mg  Vitamin B7 (biotin) 40 mcg  Vitamin B9 (folate) 300 mcg  Vitamin B12 2.4 mcg  Choline 400 mg  Calcium 1650 mg  Chromium 45 mcg  Copper 700 mcg  Fluoride 0 mg  Iodine 115 mcg  Iron 13.5 mg  Magnesium 200 mg  Manganese 2.3 mg  Molybdenum 45 mcg  Phosphorous 1250 mg  Selenium 40 mcg  Zinc 8.5 mg  Potassium 2350 mg  Sodium 450 mg  Chloride 1150 mg  Fiber 15 g   Nutrition Diagnosis: (9/10) Inadequate oral intake related to medical status and dysphagia as evidence by pt dependent on Gtube to meet nutritional needs.  Intervention: Discussed current regimen and weight. Mom reports pt is currently not consuming anything PO as he is not in school. She also reports thinking his weight looks stable and she has no concerns about weight loss. Discussed continuing current regimen for now and adjusting at next appt with updated anthros. Discussed MVI, PDN orders are for PRN so needs to be updated to daily. All questions answered, mom in agreement with plan. Recommendations: - Continue 5 feeds daily with 100 mL water flushes before and after each feed. - Offer Alfie pouches after feeds if he says he is hungry. - Continue monitoring weights.  Teach back method used.  Monitoring/Evaluation:  Goals to Monitor: - Wt trends - TF tolerance - Ability to increase protein or need to switch to lower calorie formula.  Follow-up in 5-6 months, joint with provider.  Total time spent in counseling: 20 minutes.

## 2020-03-20 NOTE — Telephone Encounter (Signed)
Addressed at visit on 4/8.

## 2020-03-23 ENCOUNTER — Encounter (INDEPENDENT_AMBULATORY_CARE_PROVIDER_SITE_OTHER): Payer: Self-pay | Admitting: Family

## 2020-03-23 ENCOUNTER — Ambulatory Visit (INDEPENDENT_AMBULATORY_CARE_PROVIDER_SITE_OTHER): Payer: Medicaid Other | Admitting: Dietician

## 2020-03-24 ENCOUNTER — Ambulatory Visit: Payer: Medicaid Other | Attending: Critical Care Medicine

## 2020-03-24 DIAGNOSIS — Z23 Encounter for immunization: Secondary | ICD-10-CM

## 2020-03-24 NOTE — Progress Notes (Signed)
   Covid-19 Vaccination Clinic  Name:  KARLOS SCADDEN    MRN: [id number] DOB: 2001/12/01  03/24/2020  Mr. Olexa was observed post Covid-19 immunization for 15 minutes without incident. He was provided with Vaccine Information Sheet and instruction to access the V-Safe system.   Mr. Carras was instructed to call 911 with any severe reactions post vaccine: Marland Kitchen Difficulty breathing  . Swelling of face and throat  . A fast heartbeat  . A bad rash all over body  . Dizziness and weakness   Immunizations Administered    Name Date Dose VIS Date Route   Moderna COVID-19 Vaccine 03/24/2020  9:18 AM 0.5 mL 11/12/2019 Intramuscular   Manufacturer: Levan Hurst   Lot: [id number]   NDC: 22575-051-83   Moderna COVID-19 Vaccine 03/24/2020  9:25 AM 0.5 mL 11/12/2019 Intramuscular   Manufacturer: Moderna   Lot: [id number]   [place]: 98421-031-28

## 2020-03-26 NOTE — Progress Notes (Signed)
Pediatric Pulmonology  Clinic Note  03/27/2020  Primary Care Physician: [name], MD   Reason For Visit: Pulmonary care for Duchenne Muscular Dystrophy  Assessment and Plan:  Lucas [name] is a 19 y.o. 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 Overall, he has done very well from a respiratory standpoint, and is doing well currently. He uses cough assist and vest daily due to impaired cough and mucus clearance, which we will continue. No changes to his regimen at this time.  Plan: - Continue cough assist and vest BID, increase to 3-4 x when sick - Check VBG / BMP if obtaining other labs  - Albuterol prn   Obstructive sleep apnea: Lucas [name] has obstructive sleep apnea related to his muscular dystrophy, and has been using CPAP for ~ 3 years now. His last sleep study showed that he needed increased pressure but that on +11 his obstructive sleep apnea was well controlled.  He is tolerating this fairly well now, so we will continue this at this time.  Would like to repeat a sleep study at some point in the next year for CPAP titration do not need to do this urgently. Plan: - Continue CPAP +11 at night - Repeat sleep study in 12 months  Healthcare Maintenance: Lucas [name] has received a flu vaccine this season.  Has received 1st of 2 covid vaccines   Followup: Return in about 6 months (around 09/26/2020).     Lucas Saxon "Will" [place] Cellar, MD [facility] Pediatric Pulmonology [place] Office: [place] [phone]   Subjective:  Lucas [name] is a 19 y.o. male who is seen for followup of pulmonary manifestations of duchenne's muscular dystrophy.  He is accompanied by his mother and home health nurse who provided the history for today's visit.     Lucas [name] was last seen by myself in clinic on 10/18/2019. At that time, he  was doing well. We continued him on his CPAP at night and airway clearance regimen. We planned to obtain a BMP/VBG when possible and repeat a sleep study in 6-12 months.   Today's mother reports that he has overall been doing very well.  Have not had any major changes, including no significant respiratory illnesses or change in his respiratory symptoms.  He has been continuing to use his CoughAssist and vest and doing well with this.  He is doing fairly well with sleep, he does use his CPAP at night.  He does not like his CPAP very much but he tolerates it okay.  He has not had any issues with breathing during sleep when he does use his CPAP.  His oxygen saturations typically are around 95% at night.  He did get his first dose of the Moderna Covid vaccine recently and will be getting his second soon.  He is still doing school at home and plans to do this for a while.  Past Medical History:   Patient Active Problem List   Diagnosis Date Noted  . Feeding by G-tube ([place]) 03/18/2019  . 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 Urinary retention 11/01/2018  . DNR (do not resuscitate) discussion 11/01/2018  . Obstructive sleep apnea syndrome 10/03/2017  . Failure to thrive (0-17) 05/24/2017  . 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 Constipation   . Post-ictal state ([place])   . Abdominal pain   . Tachycardia 06/09/2015  .  Seizure ([place]) 06/08/2015  . Autism spectrum disorder, requiring substantial support, associated with another neurodevelopmental, mental, or behavioral disorder 06/17/2014  . Vomiting 06/08/2014  . Complex care coordination 04/30/2014  . Partial epilepsy with impairment of consciousness 04/30/2014  . Duchenne muscular dystrophy 04/30/2014   Past Medical History:  Diagnosis Date  . Autism   . Autism   . Diarrhea 06/09/2015  . Family history of adverse reaction to anesthesia    MGGM- N/V  . Muscular dystrophy ([place])   .  Scoliosis   . Seizure ([place])   . Seizure ([place])   . Seizures ([place])    Last one 2013  . Transient alteration of awareness   . Viral gastroenteritis     Past Surgical History:  Procedure Laterality Date  . CIRCUMCISION     at birth  . LAPAROSCOPIC GASTROSTOMY N/A 05/24/2017   Procedure: LAPAROSCOPIC GASTROSTOMY TUBE PLACEMENT;  Surgeon: Stanford Scotland, MD;  Location: [name];  Service: General;  Laterality: N/A;   Medications:   Current Outpatient Medications:  .  carbamazepine (TEGRETOL) 100 MG chewable tablet, TAKE 1 & 1/2 TABLETS BY MOUTH TWICE DAILY, Disp: 90 tablet, Rfl: 5 .  cromolyn (OPTICROM) 4 % ophthalmic solution, Place 1 drop into both eyes 4 (four) times daily as needed., Disp: 10 mL, Rfl: 3 .  feeding supplement, PEDIASURE 1.0 CAL WITH FIBER, (PEDIASURE ENTERAL FORMULA 1.0 CAL WITH FIBER) LIQD, Place 237 mLs into feeding tube 4 (four) times daily. If he has oral intake at school do not give a 4 pm feeding only give the water flushes if no oral intake give 4 feedings a day (Patient taking differently: Place 237 mLs into feeding tube 5 (five) times daily. If he has oral intake at school do not give a 4 pm feeding only give the water flushes if no oral intake give  feedings a day), Disp: 155 mL, Rfl: 5 .  Melatonin 5 MG TABS, Take 7.5 mg by mouth at bedtime., Disp: , Rfl:  .  Polyethylene Glycol 3350 (PEG 3350) POWD, TAKE 17 G BY MOUTH ONCE FOR 1 DOSE. MIX IN 4-8OUNCES OF FLUID PRIOR TO TAKING., Disp: , Rfl:  .  senna (SENOKOT) 176 MG/5ML SYRP, Give 5-10 ml by G tube every day to every other day to produce soft stool daily If no stool in 3 days give enema, Disp: 300 mL, Rfl: 3 .  acetaminophen (TYLENOL) 160 MG/5ML elixir, Take 15 mg/kg by mouth every 4 (four) hours as needed for fever., Disp: , Rfl:  .  albuterol (PROVENTIL) (2.5 MG/3ML) 0.083% nebulizer solution, Inhale into the lungs., Disp: , Rfl:  .  diazepam (DIASTAT ACUDIAL) 10 MG GEL, Give 10 mg rectally after 2 minutes of  seizures.  May repeat in 10 minutes (Patient not taking: Reported on 03/27/2020), Disp: 1 Package, Rfl: 5 .  diphenhydrAMINE (BENADRYL) 12.5 MG/5ML elixir, 5 -10 ml po as needed for hives 3 times a day, Disp: , Rfl:  .  prednisoLONE (PRELONE) 15 MG/5ML SOLN, Take 9 mg by mouth daily before breakfast., Disp: , Rfl:   Allergies:   Allergies  Allergen Reactions  . Penicillins Hives and Rash    Has patient had a PCN reaction causing immediate rash, facial/tongue/throat swelling, SOB or lightheadedness with hypotension: Yes Has patient had a PCN reaction causing severe rash involving mucus membranes or skin necrosis: Yes Has patient had a PCN reaction that required hospitalization: No Has patient had a PCN reaction occurring within the last 10 years:  No If all of the above answers are "NO", then may proceed with Cephalosporin use.   . Calcitonin Rash  . Esomeprazole Magnesium Nausea And Vomiting  . Omeprazole Other (See Comments)    Constipation and possible rash after drug was stopped  . Vitamin D Analogs Other (See Comments)    Excessive urine output and thristy    Family History:   Family History  Problem Relation Age of Onset  . Cancer Paternal Grandfather        died at age 33  . Dementia Paternal Grandmother        died at age 1  . Other Mother        in special education, a carrier for Duchenne Muscular Dystrophy  . Hyperlipidemia Mother   . Learning disabilities Mother   . Other Maternal Uncle        Duchenne Muscular Dystrophy, was in special educations  . Other Maternal Aunt        was in special education  . Other Maternal Grandmother        a carrier for Duchenne Muscular Dystrophy/Died due to septic shock at 19 years old   . Heart failure Maternal Grandmother   . Hypertension Maternal Grandmother   . Alcohol abuse Father   . Asthma Other   . Hyperlipidemia Other   . Hypertension Other   . Vision loss Other   No asthma in the family. Otherwise, no family  history of respiratory problems, immunodeficiencies, genetic disorders, or childhood diseases.   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 His private duty nurse attends school with him.   He attends [employer].    He lives with mother.     Lives with mother and Nasier in [place] [address]. Mother smokes.  Objective:  Vitals Signs: Wt 86 lb 13.8 oz (39.4 kg)   BMI 17.68 kg/m  Wt Readings from Last 3 Encounters:  03/27/20 86 lb 13.8 oz (39.4 kg) (<1 %, Z= -4.70)*  03/19/20 86 lb 12.8 oz (39.4 kg) (<1 %, Z= -4.70)*  08/22/19 93 lb (42.2 kg) (<1 %, Z= -3.78)*   * Growth percentiles are based on CDC (Boys, 2-20 Years) data.   Ht Readings from Last 3 Encounters:  03/19/20 4' 10.78" (1.493 m) (<1 %, Z= -3.73)*  12/22/18 $RemoveB'4\' 8"'WlmPvGBM$  (1.422 m) (<1 %, Z= -4.40)*  12/19/18 $RemoveB'4\' 8"'BPZdyIPu$  (1.422 m) (<1 %, Z= -4.39)*   * Growth percentiles are based on CDC (Boys, 2-20 Years) data.   GGeneral: awake, no apparent distress by video observation Skin: what is visible appears to be clear and smooth without rash or excessive dryness HEENT: appears to be normocephalic. No deformity of ear or nose seen. Nares appear clear without rhinorrhea. Neck: symmetric without obvious masses Chest: symmetrical, no increased work of breathing or retractions Lungs: no audible stridor.  Abdomen: non distended Extremities: moving all extremities, no obvious clubbing or cyanosis  Medical Decision Making:   Recent Blood Gases/Bicarbonates:  pH, Ven  Date Value Ref Range Status  06/09/2015 7.397 (H) 7.250 - 7.300 Final   pCO2, Ven  Date Value Ref Range Status  06/09/2015 36.7 (L) 45.0 - 50.0 mmHg Final   Bicarb:  CO2  Date Value Ref Range Status  11/19/2018 28 20 - 32 mmol/L Final   Co2  Date Value Ref Range Status  03/12/2015 27 mmol/L Final    Comment:    22-32  NOTE: New Reference Range  02/17/15

## 2020-03-27 ENCOUNTER — Encounter (INDEPENDENT_AMBULATORY_CARE_PROVIDER_SITE_OTHER): Payer: Self-pay | Admitting: Pediatrics

## 2020-03-27 ENCOUNTER — Telehealth (INDEPENDENT_AMBULATORY_CARE_PROVIDER_SITE_OTHER): Payer: Medicaid Other | Admitting: Pediatrics

## 2020-03-27 VITALS — Wt 86.9 lb

## 2020-03-27 DIAGNOSIS — J984 Other disorders of lung: Secondary | ICD-10-CM | POA: Diagnosis not present

## 2020-03-27 DIAGNOSIS — G4733 Obstructive sleep apnea (adult) (pediatric): Secondary | ICD-10-CM | POA: Diagnosis not present

## 2020-03-27 DIAGNOSIS — G7101 Duchenne or Becker muscular dystrophy: Secondary | ICD-10-CM | POA: Diagnosis not present

## 2020-03-27 DIAGNOSIS — G7100 Muscular dystrophy, unspecified: Secondary | ICD-10-CM

## 2020-03-27 DIAGNOSIS — M4145 Neuromuscular scoliosis, thoracolumbar region: Secondary | ICD-10-CM | POA: Diagnosis not present

## 2020-03-27 NOTE — Patient Instructions (Addendum)
Pediatric Pulmonology  Clinic Discharge Instructions       03/27/20    It was great to see you and Lucas [name] today! We won't make any changes to his plan for now. I would like to check labs for him if he is getting labs for another reason.    Followup: Return in about 6 months (around 09/26/2020).  Please call [phone] with any further questions or concerns.

## 2020-03-31 ENCOUNTER — Telehealth (INDEPENDENT_AMBULATORY_CARE_PROVIDER_SITE_OTHER): Payer: Self-pay | Admitting: Family

## 2020-03-31 NOTE — Telephone Encounter (Signed)
[name] called to report that a hose on the back of Lucas [name]'s CPAP machine was leaking air and did not do that in the past. She feels that it is broken and is concerned about the CPAP working properly. I told Mom that we will contact [facility] about the machine. TG

## 2020-03-31 NOTE — Telephone Encounter (Signed)
email to D. Conservation officer, nature of [employer]. [employer] reports they do not have a phone number to reach him. RN advised [employer] contacted the mother of this patient and was told she had to bring the machine in and this is not possible due to patient being wheelchair dependent and family does not have transportation or family to assist.  Did not receive a reply. Call back to [employer]- spoke with Hassan Rowan who will relay information above and they will contact mom again to try and resolve the issue.

## 2020-03-31 NOTE — Telephone Encounter (Signed)
Call to [employer]- unable to reach anyone at first number [phone]- called 800 number and reached Resp dept. She reports she will contact the family about coming to check the machine.   Call to [facility]- advised if she has not heard from [employer] by 4 pm to call RN back. She agrees

## 2020-04-02 ENCOUNTER — Ambulatory Visit (INDEPENDENT_AMBULATORY_CARE_PROVIDER_SITE_OTHER): Payer: Medicaid Other | Admitting: Dietician

## 2020-04-06 ENCOUNTER — Encounter (INDEPENDENT_AMBULATORY_CARE_PROVIDER_SITE_OTHER): Payer: Self-pay

## 2020-04-20 ENCOUNTER — Ambulatory Visit: Payer: Medicaid Other | Attending: Critical Care Medicine

## 2020-04-20 DIAGNOSIS — Z23 Encounter for immunization: Secondary | ICD-10-CM

## 2020-04-20 NOTE — Progress Notes (Signed)
   Covid-19 Vaccination Clinic  Name:  CAMERON SCHWINN    MRN: [id number] DOB: 2000/12/17  04/20/2020  Mr. Rail was observed post Covid-19 immunization for 15 minutes without incident. He was provided with Vaccine Information Sheet and instruction to access the V-Safe system.   Mr. Bjelland was instructed to call 911 with any severe reactions post vaccine: Marland Kitchen Difficulty breathing  . Swelling of face and throat  . A fast heartbeat  . A bad rash all over body  . Dizziness and weakness   Immunizations Administered    Name Date Dose VIS Date Route   Moderna COVID-19 Vaccine 04/20/2020 12:18 PM 0.5 mL 11/2019 Intramuscular   Manufacturer: Moderna   Lot: [id number]   [place]: 67011-003-49

## 2020-05-06 ENCOUNTER — Telehealth (INDEPENDENT_AMBULATORY_CARE_PROVIDER_SITE_OTHER): Payer: Self-pay | Admitting: Nurse Practitioner

## 2020-05-06 NOTE — Telephone Encounter (Signed)
Routed to provider

## 2020-05-06 NOTE — Telephone Encounter (Signed)
  Who's calling (name and relationship to patient) : Olivia Mackie (mom)  Best contact number: [phone]  Provider they see: Landry Dyke  Reason for call: Mom states that there is a problem with patient's G-Tube and they need a call back to tell them what to day ASAP.     PRESCRIPTION REFILL ONLY  Name of prescription:  Pharmacy:

## 2020-05-06 NOTE — Telephone Encounter (Signed)
I spoke with Ms. Scavone. I reminded Ms. Kurtzman that Jabarie was referred to an adult surgery practice for g-tube care last year. She states Ryden has never been seen at [facility]. She states Kinser currently has a 14 [place] 2.3 cm g-tube. Per my last note on 08/16/19, Greycen had a 14 [place] 2.7 cm AMT MiniOne balloon button. She states it is bleeding around the site. She states the g-tube has npt been changed since that visit. I asked if she thought the g-tube looked tight. Ms. Ormiston states she is not currently home and cannot look at the button. A nurse is with Zayde at the moment. I informed Ms. Grau I would contact Tine Goodpasture regarding the situation. I informed Ms. Moeller I would send another referral to [facility]. Ms. Eckels was encouraged to establish care for continued g-tube management asap.

## 2020-05-12 ENCOUNTER — Encounter (INDEPENDENT_AMBULATORY_CARE_PROVIDER_SITE_OTHER): Payer: Self-pay

## 2020-05-14 ENCOUNTER — Encounter (INDEPENDENT_AMBULATORY_CARE_PROVIDER_SITE_OTHER): Payer: Self-pay

## 2020-05-14 ENCOUNTER — Telehealth (INDEPENDENT_AMBULATORY_CARE_PROVIDER_SITE_OTHER): Payer: Self-pay | Admitting: Pediatrics

## 2020-05-14 NOTE — Telephone Encounter (Signed)
  Who's calling (name and relationship to patient) : Olivia Mackie ( mom)  Best contact number: [phone]  Provider they see: [name] ,[facility]  Reason for call: mom called and stated she did not know where the  adult referral was for patient to have his  g-tube looked at. I looked at chart and informed hr it was at [place] [facility] and gave her that number. She then said she had no idea were to get patients xray done of his back. I saw in the notes that Judson Roch had instructed mom to take patient to get the x-rays and was hoping she could tell her were to go. Mom semed very confused and I wasn't sure what I needed to tell her to do    [place]  Name of prescription:  Pharmacy:

## 2020-05-14 NOTE — Telephone Encounter (Signed)
Lucas [name], I sent the orders for his x-rays to  [facility] at Address:  [address], [place], [place] [address] Hours:  Open 8:30-4:15 pm  Closed 12-1 for lunch Phone: [phone] You do no have to schedule an appointment it is by walk in. By doing them at this location Dr. Joaquim Lai will be able to view the films and report.   Last read by Mauri Pole at 9:14 AM on 02/27/2020.

## 2020-05-15 ENCOUNTER — Telehealth (INDEPENDENT_AMBULATORY_CARE_PROVIDER_SITE_OTHER): Payer: Self-pay

## 2020-05-15 NOTE — Telephone Encounter (Signed)
Called in regards to verifying if this patient can have his Mickey button changed at this location. The nurse told me that they do not carry his size that is in the chart, however, if they are given enough notice, they should be able to get what is needed.

## 2020-05-24 ENCOUNTER — Emergency Department
Admission: EM | Admit: 2020-05-24 | Discharge: 2020-05-24 | Disposition: A | Discharge status: Home / Self Care | Payer: Medicaid Other | Attending: Emergency Medicine | Admitting: Emergency Medicine

## 2020-05-24 ENCOUNTER — Emergency Department: Payer: Medicaid Other

## 2020-05-24 ENCOUNTER — Other Ambulatory Visit: Payer: Self-pay

## 2020-05-24 ENCOUNTER — Encounter: Payer: Self-pay | Admitting: Radiology

## 2020-05-24 DIAGNOSIS — R140 Abdominal distension (gaseous): Secondary | ICD-10-CM | POA: Diagnosis present

## 2020-05-24 DIAGNOSIS — F840 Autistic disorder: Secondary | ICD-10-CM | POA: Insufficient documentation

## 2020-05-24 DIAGNOSIS — Z7722 Contact with and (suspected) exposure to environmental tobacco smoke (acute) (chronic): Secondary | ICD-10-CM | POA: Diagnosis not present

## 2020-05-24 DIAGNOSIS — Z79899 Other long term (current) drug therapy: Secondary | ICD-10-CM | POA: Diagnosis not present

## 2020-05-24 DIAGNOSIS — K5641 Fecal impaction: Secondary | ICD-10-CM | POA: Diagnosis not present

## 2020-05-24 LAB — COMPREHENSIVE METABOLIC PANEL
ALT: 39 U/L (ref 0–44)
AST: 46 U/L — ABNORMAL HIGH (ref 15–41)
Albumin: 4.4 g/dL (ref 3.5–5.0)
Alkaline Phosphatase: 105 U/L (ref 38–126)
Anion gap: 9 (ref 5–15)
BUN: 8 mg/dL (ref 6–20)
CO2: 26 mmol/L (ref 22–32)
Calcium: 9.2 mg/dL (ref 8.9–10.3)
Chloride: 101 mmol/L (ref 98–111)
Creatinine, Ser: <0.3 mg/dL — ABNORMAL LOW (ref 0.61–1.24)
Glucose, Bld: 91 mg/dL (ref 70–99)
Potassium: 4.6 mmol/L (ref 3.5–5.1)
Sodium: 136 mmol/L (ref 135–145)
Total Bilirubin: 0.9 mg/dL (ref 0.3–1.2)
Total Protein: 7.6 g/dL (ref 6.5–8.1)

## 2020-05-24 LAB — CBC WITH DIFFERENTIAL/PLATELET
Abs Immature Granulocytes: 0.04 10*3/uL (ref 0.00–0.07)
Basophils Absolute: 0.1 10*3/uL (ref 0.0–0.1)
Basophils Relative: 1 %
Eosinophils Absolute: 0 10*3/uL (ref 0.0–0.5)
Eosinophils Relative: 0 %
HCT: 45 % (ref 39.0–52.0)
Hemoglobin: 15.1 g/dL (ref 13.0–17.0)
Immature Granulocytes: 0 %
Lymphocytes Relative: 20 %
Lymphs Abs: 1.9 10*3/uL (ref 0.7–4.0)
MCH: 30 pg (ref 26.0–34.0)
MCHC: 33.6 g/dL (ref 30.0–36.0)
MCV: 89.5 fL (ref 80.0–100.0)
Monocytes Absolute: 0.8 10*3/uL (ref 0.1–1.0)
Monocytes Relative: 9 %
Neutro Abs: 6.8 10*3/uL (ref 1.7–7.7)
Neutrophils Relative %: 70 %
Platelets: 194 10*3/uL (ref 150–400)
RBC: 5.03 MIL/uL (ref 4.22–5.81)
RDW: 13.6 % (ref 11.5–15.5)
WBC: 9.6 10*3/uL (ref 4.0–10.5)
nRBC: 0 % (ref 0.0–0.2)

## 2020-05-24 MED ORDER — SENNA 8.6 MG PO TABS
1.0000 | ORAL_TABLET | Freq: Every day | ORAL | 1 refills | Status: DC
Start: 2020-05-24 — End: 2021-03-18

## 2020-05-24 NOTE — ED Notes (Signed)
Pt's mother tracy at bedside.

## 2020-05-24 NOTE — ED Provider Notes (Signed)
[facility] Emergency Department Provider Note  ____________________________________________  Time seen: Approximately 2:28 AM  I have reviewed the triage vital signs and the nursing notes.   HISTORY  Chief Complaint Abdominal Pain  Level 5 caveat:  Portions of the history and physical were unable to be obtained due to patient is nonverbal and history is gathered from mother.   HPI Lucas [name] is a 19 y.o. male with a history of muscular dystrophy, scoliosis, autism, feeding tube dependent who presents for evaluation of abdominal distention.  Mother reports that she called the home health nurse and was instructed to bring patient to the hospital.  Patient is supposed to be on senna and MiraLAX daily to prevent constipation.  According to the mom she has not been able to give him the senna because she has not been able to afford it.  He has been receiving MiraLAX and had a bowel movement today.  This evening she started noticing abdominal distention and a small amount of bleeding around the G-tube.  The G-tube was last exchanged about 2 weeks ago by the home health nurse.  The G-tube is not dislodged.  No nausea or vomiting, no fever.   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   Urinary retention 11/01/2018   DNR (do not resuscitate) discussion 11/01/2018   Obstructive sleep apnea syndrome 10/03/2017   Failure to thrive (0-17) 05/24/2017   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     Constipation    Post-ictal state ([place])    Abdominal pain    Tachycardia 06/09/2015   Seizure ([place]) 06/08/2015   Autism spectrum disorder, requiring substantial support, associated with another neurodevelopmental, mental, or behavioral disorder 06/17/2014   Vomiting 06/08/2014   Complex care coordination 04/30/2014   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 Authorizing Provider  acetaminophen (TYLENOL) 160 MG/5ML elixir Take 15 mg/kg by mouth every 4 (four) hours as needed for fever.    [provider]  albuterol (PROVENTIL) (2.5 MG/3ML) 0.083% nebulizer solution Inhale into the lungs. 06/28/18 06/28/19  [provider]  carbamazepine (TEGRETOL) 100 MG chewable tablet TAKE 1 & 1/2 TABLETS BY MOUTH TWICE DAILY 10/14/19   Jodi Geralds, MD  cromolyn (OPTICROM) 4 % ophthalmic solution Place 1 drop into both eyes 4 (four) times daily as needed. 03/19/20   Rockwell Germany, NP  diazepam (DIASTAT ACUDIAL) 10 MG GEL Give 10 mg rectally after 2 minutes of seizures.  May repeat in 10 minutes Patient not taking: Reported on  03/27/2020 10/14/19   Jodi Geralds, MD  diphenhydrAMINE (BENADRYL) 12.5 MG/5ML elixir 5 -10 ml po as needed for hives 3 times a day 05/07/19   [provider]  feeding supplement, PEDIASURE 1.0 CAL WITH FIBER, (PEDIASURE ENTERAL FORMULA 1.0 CAL WITH FIBER) LIQD Place 237 mLs into feeding tube 4 (four) times daily. If he has oral intake at school do not give a 4 pm feeding only give the water flushes if no oral intake give 4 feedings a  day Patient taking differently: Place 237 mLs into feeding tube 5 (five) times daily. If he has oral intake at school do not give a 4 pm feeding only give the water flushes if no oral intake give  feedings a day 02/21/19   Mir, Gwendolyn Lima, MD  Melatonin 5 MG TABS Take 7.5 mg by mouth at bedtime.    [provider]  Polyethylene Glycol 3350 (PEG 3350) POWD TAKE 17 G BY MOUTH ONCE FOR 1 DOSE. MIX IN 4-8OUNCES OF FLUID PRIOR TO TAKING. 02/13/17   [provider]  prednisoLONE (PRELONE) 15 MG/5ML SOLN Take 9 mg by mouth daily before breakfast.    [provider]  senna (SENOKOT) 176 MG/5ML SYRP Give 5-10 ml by G tube every day to every other day to produce soft stool daily If no stool in 3 days give enema 08/22/19   Carylon Perches, MD  senna (SENOKOT) 8.6 MG TABS tablet 1 tablet (8.6 mg total) by Per NG tube route at bedtime. 05/24/20   Rudene Re, MD    Allergies Penicillins, Calcitonin, Esomeprazole magnesium, Omeprazole, and Vitamin d analogs  Family History  Problem Relation Age of Onset   Cancer Paternal Grandfather        died at age 43   Dementia Paternal Grandmother        died at age 54   Other Mother        in special education, a carrier for Duchenne Muscular Dystrophy   Hyperlipidemia Mother    Learning disabilities Mother    Other Maternal Uncle        Duchenne Muscular Dystrophy, was in special educations   Other Maternal Aunt        was in special education   Other Maternal Grandmother        a carrier for Duchenne Muscular Dystrophy/Died due to septic shock at 19 years old    Heart failure Maternal Grandmother    Hypertension Maternal Grandmother    Alcohol abuse Father    Asthma Other    Hyperlipidemia Other    Hypertension Other    Vision loss Other     Social History Social History   Tobacco Use   Smoking status: Passive Smoke Exposure - Never Smoker   Smokeless tobacco: Never Used  Vaping Use   Vaping Use:  Never used  Substance Use Topics   Alcohol use: No   Drug use: No    Review of Systems  Constitutional: Negative for fever. Eyes: Negative for visual changes. ENT: Negative for sore throat. Neck: No neck pain  Cardiovascular: Negative for chest pain. Respiratory: Negative for shortness of breath. Gastrointestinal: Negative for abdominal pain, vomiting or diarrhea. + abdominal distention Genitourinary: Negative for dysuria. Musculoskeletal: Negative for back pain. Skin: Negative for rash. Neurological: Negative for headaches, weakness or numbness. Psych: No SI or HI  ____________________________________________   PHYSICAL EXAM:  VITAL SIGNS: ED Triage Vitals  Enc Vitals Group     BP 05/24/20 0155 118/90  Pulse Rate 05/24/20 0158 (!) 122     Resp 05/24/20 0158 (!) 22     Temp 05/24/20 0158 98.9 F (37.2 C)     Temp Source 05/24/20 0158 Axillary     SpO2 05/24/20 0158 96 %     Weight 05/24/20 0155 85 lb (38.6 kg)     Height --      Head Circumference --      Peak Flow --      Pain Score --      Pain Loc --      Pain Edu? --      Excl. in [place]? --     Constitutional: Awake and in no obvious distress  HEENT:      Head: Normocephalic and atraumatic.         Eyes: Conjunctivae are normal. Sclera is non-icteric.       Mouth/Throat: Mucous membranes are moist.       Neck: Supple with no signs of meningismus. Cardiovascular: Regular rate and rhythm. No murmurs, gallops, or rubs.  Respiratory: Normal respiratory effort. Lungs are clear to auscultation bilaterally.  Gastrointestinal: Abdomen is distended but not nontender on palpation with normal bowel sounds Musculoskeletal:No edema, cyanosis, or erythema of extremities. Neurologic: Muscle contractions No gross focal neurologic deficits are appreciated. Skin: Skin is warm, dry and intact. No rash noted. Psychiatric: Mood and affect are normal. Speech and behavior are  normal.  ____________________________________________   LABS (all labs ordered are listed, but only abnormal results are displayed)  Labs Reviewed  COMPREHENSIVE METABOLIC PANEL - Abnormal; Notable for the following components:      Result Value   Creatinine, Ser <0.30 (*)    AST 46 (*)    All other components within normal limits  CBC WITH DIFFERENTIAL/PLATELET  URINALYSIS, COMPLETE (UACMP) WITH MICROSCOPIC   ____________________________________________  EKG  none  ____________________________________________  RADIOLOGY  I have personally reviewed the images performed during this visit and I agree with the Radiologist's read.   Interpretation by Radiologist:  DG Abdomen 1 View  Result Date: 05/24/2020 CLINICAL DATA:  Distension. EXAM: ABDOMEN - 1 VIEW COMPARISON:  Apr 23, 2017 FINDINGS: There is nonspecific gaseous distention of loops of small bowel and colon scattered throughout the abdomen. There is a large amount of stool at the level the rectum. Extensive posterior fusion hardware is noted throughout the visualized portions of the thoracolumbar spine. The patient's gastrostomy tube projects over the left mid abdomen. Exact positioning cannot be determined on this study. IMPRESSION: 1. Nonspecific gaseous distention of loops of small bowel and colon scattered throughout the abdomen. 2. Large amount of stool at the level of the rectum. Electronically Signed   By: Constance Holster M.D.   On: 05/24/2020 02:33     ____________________________________________   PROCEDURES  Procedure(s) performed: None Procedures Critical Care performed:  None ____________________________________________   INITIAL IMPRESSION / ASSESSMENT AND PLAN / ED COURSE  19 y.o. male with a history of muscular dystrophy, scoliosis, autism, feeding tube dependent who presents for evaluation of abdominal distention.  Patient is in no obvious distress with a distended abdomen but nontender to  palpation with positive bowel sounds.  KUB showing no significant constipation but a large amount of stool at the rectum visualized by me and confirmed by radiology.  Stool was soft on exam and patient was disimpacted.  Labs showing no acute abnormalities, no electrolyte derangements, no leukocytosis.  At this time will discharge home to the care of his mother  and recommended continuing MiraLAX and senna.  Will provide a prescription for both. UA has not been done however mother is requesting to be discharged at this time.  Old medical records reviewed.       _____________________________________________ Please note:  Patient was evaluated in Emergency Department today for the symptoms described in the history of present illness. Patient was evaluated in the context of the global COVID-19 pandemic, which necessitated consideration that the patient might be at risk for infection with the SARS-CoV-2 virus that causes COVID-19. Institutional protocols and algorithms that pertain to the evaluation of patients at risk for COVID-19 are in a state of rapid change based on information released by regulatory bodies including the CDC and federal and state organizations. These policies and algorithms were followed during the patient's care in the ED.  Some ED evaluations and interventions may be delayed as a result of limited staffing during the pandemic.   [place] Controlled Substance Database was reviewed by me. ____________________________________________   FINAL CLINICAL IMPRESSION(S) / ED DIAGNOSES   Final diagnoses:  Fecal impaction ([place])      NEW MEDICATIONS STARTED DURING THIS VISIT:  ED Discharge Orders         Ordered    senna (SENOKOT) 8.6 MG TABS tablet  Daily at bedtime     Discontinue  Reprint     05/24/20 0302           Note:  This document was prepared using Dragon voice recognition software and may include unintentional dictation errors.    Alfred Levins, [place], MD 05/24/20  [phone]

## 2020-05-24 NOTE — ED Notes (Signed)
Pt cleansed of large amount of soft brown stool.

## 2020-05-24 NOTE — ED Notes (Signed)
g tube stoma cleansed for scant serosanginous drainage, new split guaze applied.

## 2020-05-24 NOTE — ED Triage Notes (Signed)
Pt from home with mother with g tube stoma bleeding and abd bloating per mother. Per mother last bowel movement today but not "all the way".

## 2020-05-24 NOTE — ED Notes (Signed)
Pt disimpacted of approx one handful of stool. Pt has passed approx another handful of pasty brown stool himself.

## 2020-05-25 ENCOUNTER — Telehealth (INDEPENDENT_AMBULATORY_CARE_PROVIDER_SITE_OTHER): Payer: Self-pay | Admitting: Family

## 2020-05-25 DIAGNOSIS — G7101 Duchenne or Becker muscular dystrophy: Secondary | ICD-10-CM

## 2020-05-25 DIAGNOSIS — M4145 Neuromuscular scoliosis, thoracolumbar region: Secondary | ICD-10-CM

## 2020-05-25 DIAGNOSIS — F840 Autistic disorder: Secondary | ICD-10-CM

## 2020-05-25 NOTE — Telephone Encounter (Signed)
Mailed information as well

## 2020-05-25 NOTE — Telephone Encounter (Signed)
Sent x-ray information by Mychart and mailed information to the address in Epic

## 2020-05-25 NOTE — Telephone Encounter (Signed)
Who's calling (name and relationship to patient) : Lucas [name] mom   Best contact number: [phone]  Provider they see: Lucas [name].   Reason for call: Lucas [name] was in ER the other day and took an X-ray of his stomach area and you can see the back of the spine. Mom wanted to call and tell Lucas [name] that. Mom also wants find out if the Dr. Joaquim [name] at [facility] will accept him as a patient and if that X-ray is useful for Dr. Joaquim [name] to help.   Call ID:      PRESCRIPTION REFILL ONLY  Name of prescription:  Pharmacy:

## 2020-05-26 NOTE — Telephone Encounter (Signed)
Returned call to mom Precious Reel wanted to know if the x-ray of his stomach will cover what Dr. Joaquim Lai wanted to see. RN advised no. Scoliosis x-rays show the curvature of the spine and measure the degree of the curve to compare with previous films. Mom reports he has an appointment in [place] about his G tube and now wants the order sent back to [facility] and she will have it done on the same day. Mom keeps saying she will have it done at the same place but RN repeatedly advised it is in the same building but it will not be done at [place] will re-enter order for GI.

## 2020-06-05 ENCOUNTER — Other Ambulatory Visit: Payer: Self-pay

## 2020-06-05 ENCOUNTER — Encounter: Payer: Self-pay | Admitting: Emergency Medicine

## 2020-06-05 ENCOUNTER — Emergency Department
Admission: EM | Admit: 2020-06-05 | Discharge: 2020-06-05 | Disposition: A | Discharge status: Home / Self Care | Payer: Medicaid Other | Attending: Emergency Medicine | Admitting: Emergency Medicine

## 2020-06-05 ENCOUNTER — Emergency Department: Payer: Medicaid Other

## 2020-06-05 DIAGNOSIS — S4992XA Unspecified injury of left shoulder and upper arm, initial encounter: Secondary | ICD-10-CM | POA: Diagnosis present

## 2020-06-05 DIAGNOSIS — Y929 Unspecified place or not applicable: Secondary | ICD-10-CM | POA: Insufficient documentation

## 2020-06-05 DIAGNOSIS — Y939 Activity, unspecified: Secondary | ICD-10-CM | POA: Insufficient documentation

## 2020-06-05 DIAGNOSIS — X58XXXA Exposure to other specified factors, initial encounter: Secondary | ICD-10-CM | POA: Diagnosis not present

## 2020-06-05 DIAGNOSIS — S42302A Unspecified fracture of shaft of humerus, left arm, initial encounter for closed fracture: Secondary | ICD-10-CM | POA: Insufficient documentation

## 2020-06-05 DIAGNOSIS — Y999 Unspecified external cause status: Secondary | ICD-10-CM | POA: Diagnosis not present

## 2020-06-05 DIAGNOSIS — Z7722 Contact with and (suspected) exposure to environmental tobacco smoke (acute) (chronic): Secondary | ICD-10-CM | POA: Diagnosis not present

## 2020-06-05 DIAGNOSIS — S42202A Unspecified fracture of upper end of left humerus, initial encounter for closed fracture: Secondary | ICD-10-CM

## 2020-06-05 DIAGNOSIS — F840 Autistic disorder: Secondary | ICD-10-CM | POA: Diagnosis not present

## 2020-06-05 LAB — CBC WITH DIFFERENTIAL/PLATELET
Abs Immature Granulocytes: 0.04 10*3/uL (ref 0.00–0.07)
Basophils Absolute: 0 10*3/uL (ref 0.0–0.1)
Basophils Relative: 1 %
Eosinophils Absolute: 0 10*3/uL (ref 0.0–0.5)
Eosinophils Relative: 0 %
HCT: 39.6 % (ref 39.0–52.0)
Hemoglobin: 13.4 g/dL (ref 13.0–17.0)
Immature Granulocytes: 1 %
Lymphocytes Relative: 10 %
Lymphs Abs: 0.8 10*3/uL (ref 0.7–4.0)
MCH: 30 pg (ref 26.0–34.0)
MCHC: 33.8 g/dL (ref 30.0–36.0)
MCV: 88.8 fL (ref 80.0–100.0)
Monocytes Absolute: 1.1 10*3/uL — ABNORMAL HIGH (ref 0.1–1.0)
Monocytes Relative: 13 %
Neutro Abs: 6.5 10*3/uL (ref 1.7–7.7)
Neutrophils Relative %: 75 %
Platelets: 187 10*3/uL (ref 150–400)
RBC: 4.46 MIL/uL (ref 4.22–5.81)
RDW: 13.2 % (ref 11.5–15.5)
WBC: 8.5 10*3/uL (ref 4.0–10.5)
nRBC: 0 % (ref 0.0–0.2)

## 2020-06-05 LAB — BASIC METABOLIC PANEL
Anion gap: 9 (ref 5–15)
BUN: 10 mg/dL (ref 6–20)
CO2: 27 mmol/L (ref 22–32)
Calcium: 8.8 mg/dL — ABNORMAL LOW (ref 8.9–10.3)
Chloride: 102 mmol/L (ref 98–111)
Creatinine, Ser: <0.3 mg/dL — ABNORMAL LOW (ref 0.61–1.24)
Glucose, Bld: 162 mg/dL — ABNORMAL HIGH (ref 70–99)
Potassium: 4.2 mmol/L (ref 3.5–5.1)
Sodium: 138 mmol/L (ref 135–145)

## 2020-06-05 LAB — URINALYSIS, COMPLETE (UACMP) WITH MICROSCOPIC
Bacteria, UA: NONE SEEN
Bilirubin Urine: NEGATIVE
Glucose, UA: 50 mg/dL — AB
Hgb urine dipstick: NEGATIVE
Ketones, ur: NEGATIVE mg/dL
Leukocytes,Ua: NEGATIVE
Nitrite: NEGATIVE
Protein, ur: NEGATIVE mg/dL
Specific Gravity, Urine: 1.009 (ref 1.005–1.030)
pH: 7 (ref 5.0–8.0)

## 2020-06-05 MED ORDER — LACTATED RINGERS IV BOLUS
1000.0000 mL | Freq: Once | INTRAVENOUS | Status: AC
  Administered 2020-06-05: 1000 mL via INTRAVENOUS

## 2020-06-05 MED ORDER — HYDROCODONE-ACETAMINOPHEN 7.5-325 MG/15ML PO SOLN: 15.0000 mL | Freq: Four times a day (QID) | ORAL | 0 refills | Status: DC | PRN

## 2020-06-05 MED ORDER — MORPHINE SULFATE (PF) 4 MG/ML IV SOLN
4.0000 mg | Freq: Once | INTRAVENOUS | Status: AC
  Administered 2020-06-05: 4 mg via INTRAVENOUS
  Filled 2020-06-05: qty 1

## 2020-06-05 NOTE — ED Triage Notes (Signed)
Pt presents from home via [facility] with c/o left arm injury. Unknown mechanism of injury. Deformity noted to left humerus by family, bruising noted to affected extremity. Pt tachycardic and febrile for ems. Pt has hx of muscular dystrophy. 200 cc fluid given by ems. 51mcg fentanyl given by ems. pt non-verbal at baseline.

## 2020-06-05 NOTE — ED Notes (Signed)
EDP present at time of splint application. Skin intact with minimal bruising noted, cap refill, temperature of skin and pulses WNL

## 2020-06-05 NOTE — ED Notes (Signed)
Called EMS for ride to home. RN Eyvonne Mechanic said pt will be ready for discharge in 15 min.

## 2020-06-05 NOTE — ED Notes (Signed)
Lucas [name] mother reviewed discharge instructions

## 2020-06-05 NOTE — ED Provider Notes (Addendum)
[facility] Emergency Department Provider Note   ____________________________________________   First MD Initiated Contact with Patient 06/05/20 1650     (approximate)  I have reviewed the triage vital signs and the nursing notes.   HISTORY  Chief Complaint Arm Injury and Fever    HPI Lucas [name] is a 19 y.o. 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  Mom states that she noticed earlier this afternoon that patient's left upper arm seem to be deformed and that he was in pain whenever she went to move it.  She is concerned that he may have broken her arm as he has severe osteoporosis and has had fractures with minimal movement in the past.  He spends much of his time in bed but will occasionally sit in a chair, she denies any recent falls and cannot think of any other potential traumatic injuries.  Mother states he has otherwise been doing well, G-tube has been functioning normally and he has not had any cough, difficulty breathing, vomiting, or diarrhea.  Patient noted to be tachycardic during transport and EMS was concerned that patient could have a fever, was found to have a temperature of 99.6.  Mother states that patient has not had any elevated temperatures at home and has not noticed any infectious symptoms.        Past Medical History:  Diagnosis Date  . Autism   . Autism   . Diarrhea 06/09/2015  . Family history of adverse reaction to anesthesia    MGGM- N/V  . Muscular dystrophy ([place])   . Scoliosis   . Seizure ([place])   . Seizure ([place])   . Seizures ([place])    Last one 2013  . Transient alteration of awareness   . Viral gastroenteritis     Patient Active Problem List   Diagnosis Date Noted  . Feeding by G-tube ([place]) 03/18/2019  . 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 Urinary retention 11/01/2018  . DNR (do not  resuscitate) discussion 11/01/2018  . Obstructive sleep apnea syndrome 10/03/2017  . Failure to thrive (0-17) 05/24/2017  . 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 Constipation   . Post-ictal state ([place])   . Abdominal pain   . Tachycardia 06/09/2015  . Seizure ([place]) 06/08/2015  . Autism spectrum disorder, requiring substantial support, associated with another neurodevelopmental, mental, or behavioral disorder 06/17/2014  . Vomiting 06/08/2014  . Complex care coordination 04/30/2014  . Partial epilepsy with impairment of consciousness 04/30/2014  . 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 CIRCUMCISION     at birth  . LAPAROSCOPIC GASTROSTOMY N/A 05/24/2017   Procedure: LAPAROSCOPIC GASTROSTOMY TUBE PLACEMENT;  Surgeon: Stanford Scotland, MD;  Location: [facility];  Service: General;  Laterality: N/A;    Prior to Admission medications   Medication Sig Start Date End Date Taking? Authorizing Provider  acetaminophen (TYLENOL) 160 MG/5ML elixir Take 15 mg/kg by mouth every 4 (four) hours as needed for fever.    [provider]  albuterol (PROVENTIL) (2.5 MG/3ML) 0.083% nebulizer solution Inhale into the lungs. 06/28/18 06/28/19  [provider]  carbamazepine (TEGRETOL) 100 MG chewable tablet TAKE 1 & 1/2 TABLETS BY MOUTH TWICE DAILY Patient taking differently: Chew 150 mg by mouth 2 (two) times daily.  10/14/19   Jodi Geralds, MD  cromolyn (OPTICROM) 4 %  ophthalmic solution Place 1 drop into both eyes 4 (four) times daily as needed. 03/19/20   Rockwell Germany, NP  diazepam (DIASTAT ACUDIAL) 10 MG GEL Give 10 mg rectally after 2 minutes of seizures.  May repeat in 10 minutes Patient not taking: Reported on 03/27/2020 10/14/19   Jodi Geralds, MD  diphenhydrAMINE (BENADRYL) 12.5 MG/5ML elixir 5 -10 ml po as needed for hives 3 times a day 05/07/19   [provider]  feeding supplement,  PEDIASURE 1.0 CAL WITH FIBER, (PEDIASURE ENTERAL FORMULA 1.0 CAL WITH FIBER) LIQD Place 237 mLs into feeding tube 4 (four) times daily. If he has oral intake at school do not give a 4 pm feeding only give the water flushes if no oral intake give 4 feedings a day Patient taking differently: Place 237 mLs into feeding tube 5 (five) times daily. If he has oral intake at school do not give a 4 pm feeding only give the water flushes if no oral intake give  feedings a day 02/21/19   Mir, Gwendolyn Lima, MD  HYDROcodone-acetaminophen (HYCET) 7.5-325 mg/15 ml solution Take 15 mLs by mouth 4 (four) times daily as needed for moderate pain. 06/05/20 06/05/21  Blake Divine, MD  Melatonin 5 MG TABS Take 7.5 mg by mouth at bedtime.    [provider]  Polyethylene Glycol 3350 (PEG 3350) POWD TAKE 17 G BY MOUTH ONCE FOR 1 DOSE. MIX IN 4-8OUNCES OF FLUID PRIOR TO TAKING. 02/13/17   [provider]  prednisoLONE (PRELONE) 15 MG/5ML SOLN Take 9 mg by mouth daily before breakfast.    [provider]  senna (SENOKOT) 176 MG/5ML SYRP Give 5-10 ml by G tube every day to every other day to produce soft stool daily If no stool in 3 days give enema 08/22/19   Carylon Perches, MD  senna (SENOKOT) 8.6 MG TABS tablet 1 tablet (8.6 mg total) by Per NG tube route at bedtime. 05/24/20   Rudene Re, MD    Allergies Penicillins, Calcitonin, Esomeprazole magnesium, Omeprazole, and Vitamin d analogs  Family History  Problem Relation Age of Onset  . Cancer Paternal Grandfather        died at age 61  . Dementia Paternal Grandmother        died at age 51  . Other Mother        in special education, a carrier for Duchenne Muscular Dystrophy  . Hyperlipidemia Mother   . Learning disabilities Mother   . Other Maternal Uncle        Duchenne Muscular Dystrophy, was in special educations  . Other Maternal Aunt        was in special education  . Other Maternal Grandmother        a carrier for Duchenne  Muscular Dystrophy/Died due to septic shock at 19 years old   . Heart failure Maternal Grandmother   . Hypertension Maternal Grandmother   . Alcohol abuse Father   . Asthma Other   . Hyperlipidemia Other   . Hypertension Other   . Vision loss Other     Social History Social History   Tobacco Use  . Smoking status: Passive Smoke Exposure - Never Smoker  . Smokeless tobacco: Never Used  Vaping Use  . Vaping Use: Never used  Substance Use Topics  . Alcohol use: No  . Drug use: No    Review of Systems Unable to obtain secondary to patient nonverbal.  ____________________________________________   PHYSICAL EXAM:  VITAL SIGNS: ED  Triage Vitals  Enc Vitals Group     BP 06/05/20 1652 122/84     Pulse Rate 06/05/20 1652 (!) 124     Resp 06/05/20 1652 20     Temp 06/05/20 1652 98.3 F (36.8 C)     Temp Source 06/05/20 1652 Axillary     SpO2 06/05/20 1652 96 %     Weight 06/05/20 1653 85 lb (38.6 kg)     Height --      Head Circumference --      Peak Flow --      Pain Score --      Pain Loc --      Pain Edu? --      Excl. in [place]? --     Constitutional: Awake and alert. Eyes: Conjunctivae are normal. Head: Atraumatic. Nose: No congestion/rhinnorhea. Mouth/Throat: Mucous membranes are moist. Neck: Normal ROM Cardiovascular: Tachycardic, regular rhythm. Grossly normal heart sounds. Respiratory: Normal respiratory effort.  No retractions. Lungs CTAB. Gastrointestinal: Soft and nontender. No distention.  G-tube in place. Genitourinary: deferred Musculoskeletal: No lower extremity tenderness nor edema.  Deformity to left mid humerus, otherwise no obvious deformities or tenderness to other extremities. Neurologic: No gross focal neurologic deficits are appreciated. Skin:  Skin is warm, dry and intact. No rash noted. Psychiatric: Unable to assess.  ____________________________________________   LABS (all labs ordered are listed, but only abnormal results are  displayed)  Labs Reviewed  BASIC METABOLIC PANEL - Abnormal; Notable for the following components:      Result Value   Glucose, Bld 162 (*)    Creatinine, Ser <0.30 (*)    Calcium 8.8 (*)    All other components within normal limits  CBC WITH DIFFERENTIAL/PLATELET - Abnormal; Notable for the following components:   Monocytes Absolute 1.1 (*)    All other components within normal limits  URINALYSIS, COMPLETE (UACMP) WITH MICROSCOPIC - Abnormal; Notable for the following components:   Color, Urine YELLOW (*)    APPearance CLOUDY (*)    Glucose, UA 50 (*)    All other components within normal limits    PROCEDURES  Procedure(s) performed (including Critical Care):  .Ortho Injury Treatment  Date/Time: 06/05/2020 8:12 PM Performed by: Blake Divine, MD Authorized by: Blake Divine, MD   Consent:    Consent obtained:  Verbal   Consent given by:  Eber Hong location: upper arm Location details: left upper arm Injury type: fracture Fracture type: humeral shaft Pre-procedure neurovascular assessment: neurovascularly intact Pre-procedure distal perfusion: normal Pre-procedure neurological function: normal Pre-procedure range of motion: reduced  Anesthesia: Local anesthesia used: no  Patient sedated: NoManipulation performed: no Immobilization: splint Splint type: Coaptation. Supplies used: Ortho-Glass,  cotton padding and elastic bandage Post-procedure neurovascular assessment: post-procedure neurovascularly intact Post-procedure distal perfusion: normal Post-procedure neurological function: normal Post-procedure range of motion: unchanged Patient tolerance: patient tolerated the procedure well with no immediate complications      ____________________________________________   INITIAL IMPRESSION / ASSESSMENT AND PLAN / ED COURSE       19 year old male with history of Duchenne's muscular dystrophy, autistic spectrum disorder, and seizures presents to the ED  after his mother noticed a deformity to his left upper arm earlier today.  He does appear to have a deformity but mother denies any recent traumatic injuries, states he has had fractures with minimal movement in the past due to severe osteoporosis.  He is neurovascularly intact to his left upper extremity and no other apparent injuries to his extremities.  We will assess  with x-ray.  EMS raise concern for infection given tachycardia and mildly elevated temperature, however patient is afebrile here and I suspect his tachycardia is due to pain.  He was previously treated with fentanyl during transport, we will screen chest x-ray, UA, and basic labs.  Infectious work-up is unremarkable and patient remains afebrile here in the ED.  Basic labs are also reassuring.  X-rays do reveal fracture of patient's proximal humerus on the left.  Case discussed with Dr. Posey Pronto of orthopedics, who recommends placement of a coaptation splint, after which patient would be appropriate for discharge and outpatient follow-up.  He was placed in a coaptation splint as well as shoulder immobilizer, will send prescription for Hycet for use as needed.  Mother counseled to have patient follow-up with orthopedics and otherwise return to the ED for new worsening symptoms, mother agrees with plan.      ____________________________________________   FINAL CLINICAL IMPRESSION(S) / ED DIAGNOSES  Final diagnoses:  Closed fracture of proximal end of left humerus, unspecified fracture morphology, initial encounter     ED Discharge Orders         Ordered    HYDROcodone-acetaminophen (HYCET) 7.5-325 mg/15 ml solution  4 times daily PRN     Discontinue  Reprint     06/05/20 2002           Note:  This document was prepared using Dragon voice recognition software and may include unintentional dictation errors.   Blake Divine, MD 06/05/20 [place]    Blake Divine, MD 06/05/20 2013

## 2020-06-05 NOTE — ED Notes (Signed)
Pt left exam room via [employer]

## 2020-06-11 ENCOUNTER — Telehealth: Payer: Self-pay | Admitting: Family Medicine

## 2020-06-11 NOTE — Telephone Encounter (Signed)
   Lucas [name] DOB: 11-Nov-2001 MRN: [id number]   RIDER WAIVER AND RELEASE OF LIABILITY  For purposes of improving physical access to our facilities, [facility] is pleased to partner with third parties to provide [place] patients or other authorized individuals the option of convenient, on-demand ground transportation services (the [place]") through use of the technology service that enables users to request on-demand ground transportation from independent third-party providers.  By opting to use and accept these [employer], I, the undersigned, hereby agree on behalf of myself, and on behalf of any minor child using the [employer] for whom I am the parent or legal guardian, as follows:  1. Government social research officer provided to me are provided by independent third-party transportation providers who are not [employer] or employees and who are unaffiliated with [employer]. 2. [place] is neither a transportation carrier nor a common or public carrier. 3. [facility] has no control over the quality or safety of the transportation that occurs as a result of the [employer]. 4. [facility] cannot guarantee that any third-party transportation provider will complete any arranged transportation service. 5. [facility] makes no representation, warranty, or guarantee regarding the reliability, timeliness, quality, safety, suitability, or availability of any of the Transport Services or that they will be error free. 6. I fully understand that traveling by vehicle involves risks and dangers of serious bodily injury, including permanent disability, paralysis, and death. I agree, on behalf of myself and on behalf of any minor child using the Transport Services for whom I am the parent or legal guardian, that the entire risk arising out of my use of the [employer] remains solely with me, to the maximum extent permitted under applicable law. 7. The [employer] are provided "as is" and "as available." [place] disclaims all representations and warranties, express, implied or statutory, not expressly set out in these terms, including the implied warranties of merchantability and fitness for a particular purpose. 8. I hereby waive and release [facility], its agents, employees, officers, directors, representatives, insurers, attorneys, assigns, successors, subsidiaries, and affiliates from any and all past, present, or future claims, demands, liabilities, actions, causes of action, or suits of any kind directly or indirectly arising from acceptance and use of the [employer]. 9. I further waive and release [place] [facility] and its affiliates from all present and future liability and responsibility for any injury or death to persons or damages to property caused by or related to the use of the [employer]. 10. I have read this Waiver and Release of Liability, and I understand the terms used in it and their legal significance. This Waiver is freely and voluntarily given with the understanding that my right (as well as the right of any minor child for whom I am the parent or legal guardian using the [employer]) to legal recourse against  in connection with the [employer] is knowingly surrendered in return for use of these services.   I attest that I read the consent document to Bevely Palmer, gave Mr. Knutzen the opportunity to ask questions and answered the questions asked (if any). I affirm that [place] then provided consent for he's participation in this program.     Legrand Pitts

## 2020-06-12 ENCOUNTER — Other Ambulatory Visit (INDEPENDENT_AMBULATORY_CARE_PROVIDER_SITE_OTHER): Payer: Self-pay | Admitting: Pediatrics

## 2020-06-12 DIAGNOSIS — G40209 Localization-related (focal) (partial) symptomatic epilepsy and epileptic syndromes with complex partial seizures, not intractable, without status epilepticus: Secondary | ICD-10-CM

## 2020-06-15 ENCOUNTER — Encounter (INDEPENDENT_AMBULATORY_CARE_PROVIDER_SITE_OTHER): Payer: Self-pay

## 2020-06-15 ENCOUNTER — Telehealth (INDEPENDENT_AMBULATORY_CARE_PROVIDER_SITE_OTHER): Payer: Self-pay

## 2020-06-15 NOTE — Telephone Encounter (Signed)
Call to [facility] after noting patient had 2 recent ER visits in June  1 for fecal impaction and the other for fractured left arm. RN called to follow up on information noted in at the visits related to cause and about what happened. Secure Email to CAP manager Jeani Hawking A. And call to PT Rickard Rhymes- Caryl Pina reports due to his contractures he does break when moving him and last time he cried when it happened so mom was aware. She does think it was accidental. She denies any need for new equipment. Reports mom does not like Harrel Lemon but it works fine, wheelchair is fine. RN asked about Spinal x-rays she reports usually when she does not take him for appts it is related to transportation needs.

## 2020-06-15 NOTE — Telephone Encounter (Signed)
Call back from [place]'s case manager Jeani Hawking- she reports it should only take her 3 days notice to [place] out of the [place] not 5 as noted in ortho note. RN advised can re-enter spinal xray orders to their clinic if mom will have them done while she is there for follow up on his arm. Or they should be able to see them in Epic at [facility]. She is going to contact [facility] and see if they can do the x-rays and then call mom. She reports mom called her when his arm broke asking what to do and she advised to take to ER. She said she rolled him over to change him and he started crying and she could tell it was his arm.

## 2020-06-15 NOTE — Progress Notes (Deleted)
Per PT Haitham does not need any equipment at this time.    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   Neck: Supple, no meningismus. No focal tenderness.  Resp: normal work of breathing.   CV: well perfused-05/11/18 - normal biventricular size and function  Abd: Abdomen non-distended. G-tube c/d/i. No hepatosplenomegaly or mass, history of reflux and constipation  Ext: No deformities, mod muscle wasting, ROM full.  MS: Awake, alert.  Nonverbal, but interactive with mother. Smiles responsively and appears to follow simple commands or to understand at times.    Cranial Nerves: No nystagmus; no ptsosis, face symmetric with full strength of facial muscles, hearing grossly intact, palate elevation is symmetric.  Motor- Able to move arms at least antigravity. No abnormal movements  Sensation: Responds to touch in all extremities.   Coordination: Does not reach for objects.   Gait: wheelchair dependent.    Neurologic - severe generalized weakness with wheelchair dependence, significant intellectual deficit, no language, dependent upon caregivers for all ADL's  Pulmonary - has sleep apnea, uses CPAP at night  GI - has dysphagia with gastrostomy tube dependence (14 fr 2.0 cm Mini-one) and purees or Gerber pouches by mouth for  pleasure  Incontinent of stool and urine  Guardians/Caregivers: [name] (mother) ph [phone] [facility]  Recent Events:  Closed fracture left humerus 05/29/20  ER 6/13- fecal impaction   Upcoming Plans:   2 view Scoliosis X-rays for [facility] Joaquim Lai at [facility]- order faxed to [place]- mom plans to take him for x-rays when she sees her doctor for hand problem- now order is at [place] at [facility] request  Needs follow up visit with Dr. [place] Cellar in Oct. And follow up sleep study with CPAP and transcutaneous CO2 monitoring  Cardiology follow-up 12-18 months, last 12/20 [facility] office [phone]; [place] office [phone])    Feeding: Last updated: 03/20/2020 DME: [facility] - fax [phone] Formula: Pediasure 1.0 + Fiber - 5 cans daily  Current regimen:  Day feeds: 237 mL @ 237 mL/hr x 5 feeds @ 7 AM, 10 AM, 1 PM, 4 PM, and 7 PM Overnight feeds: none  FWF: 100 mL before and after each feed (1000 mL total)  PO: limited - mom to offer a baby food pouch after feeds if pt reports being hungry Supplements: none  Past/failed meds:  Mom feels reflux medications caused vomiting .DOES NOT TOLERATE any reflux meds  Intolerant of Ca and Vitamin D supplements- May need to re-try due to Osteo- bone  Providers:  [name], MD,  ([place]) ph [phone] fax [phone]  [name], MD [facility] cardiology) ph [phone] fax [phone]-- upcoming  [name], MD [facility] orthopedics) ph [phone] fax [phone]  [name], MD [facility] pulmonology) ph [phone] fax [phone]  [name], MD (Neurology/Muscular dystrophy clinic at [facility]) ph [phone] fax [phone]  [facility]  [place], [facility] Dent[facility]) ph [phone] fax [phone]  Estanislado Spire, [place] ([place] [facility] dietitian) ph [phone] fax [phone]  Pat Patrick, MD (Pediatric Pulmonology at [facility])  [phone] Fax: [phone]  [name], MD ([place] in [place]) ph. [phone] Fax [phone] (following for fx arm)  Community support/services:  [name] ph [phone] Trixie Deis, PT- 1 x a month  [place] Has PDN 84 hours per week Has respite hours - [place]- ph [phone], fax [phone]  CAP/C  [facility] caseworker -Amy Baker with Daun Peacock, RN -[phone]  CAP-C  ph [phone] fax [phone]  Dorian Pod and Earney Hamburg (cousins)    Grandmother's old church members call sometimes.  Nurse interested in getting into rec center.    MDA society not helpful to mom  Equipment:  [place]- [phone] CPAP at 11,Nebulizer, [place] and Mobility-Travis Minette Brine [phone] [facility] lift, Sleep safe bed, Wheelchair  05/2019, [place] chair may need knew one due to length  [place] Rom-Respiratory vest,Cough assist machine- BID- QID  [place] ([facility])-ph: [phone] fax: [phone] Incontinence supplies, Formula, [place], G-tube Mini-one Pediasure with Fiber 1.0, (contact - "Jackie") 14 fr 2.3 cm Mini One button  Hand splints bilaterally- Left is worse than right   **Needs a van.  Currently lifting him into seat, then pushing wheelchair the van by a ramp.**  Goals of care:  do remote learning for safety  Advance care planning: Full Code    Psychosocial:  Father is not involved with the patient. Mom has cousin that helps with [name] at times - [place], as well as their daughter [name].   Lives with mom and Attends [employer]  Diagnostics/Screenings:  EEG showed right central diphasic sharply contoured slow-wave activity.   MRI of the brain failed to show a structural abnormality.  Autism was diagnosed at age 19  Duchenne muscular dystrophy was  diagnosed at age 33.  [facility]ysomnogram October 2018 at [facility] - obstructive sleep apnea  EKG 05/11/18 - normal sinus rhythm with normal PR, QRS and corrected QT interval  Echocardiogram 05/11/18 - normal biventricular size and function  11/28/19 Echo- ECG: normal rate and rhythm, hear rate 95/minute is actually a bit lower than last 2 ECG's, favorable.Echocardiogram: normal chamber size, wall thickness, squeezing.  05/29/2020: proximal third left humeral shaft fracture with varus angulation. Osteoporotic bone noted   05/24/2020 abdominal x-ray Large amount of stool at the level of the rectum    Rockwell Germany NP-C and Carylon Perches, MD Pediatric Complex Care Program Ph. [phone] Fax [phone]

## 2020-06-16 ENCOUNTER — Other Ambulatory Visit (INDEPENDENT_AMBULATORY_CARE_PROVIDER_SITE_OTHER): Payer: Self-pay | Admitting: Family

## 2020-06-16 ENCOUNTER — Other Ambulatory Visit: Payer: Self-pay

## 2020-06-16 ENCOUNTER — Emergency Department: Payer: Medicaid Other

## 2020-06-16 ENCOUNTER — Emergency Department
Admission: EM | Admit: 2020-06-16 | Discharge: 2020-06-17 | Disposition: A | Discharge status: Home / Self Care | Payer: Medicaid Other | Attending: Emergency Medicine | Admitting: Emergency Medicine

## 2020-06-16 DIAGNOSIS — F840 Autistic disorder: Secondary | ICD-10-CM

## 2020-06-16 DIAGNOSIS — M4145 Neuromuscular scoliosis, thoracolumbar region: Secondary | ICD-10-CM

## 2020-06-16 DIAGNOSIS — K5901 Slow transit constipation: Secondary | ICD-10-CM | POA: Diagnosis not present

## 2020-06-16 DIAGNOSIS — G7101 Duchenne or Becker muscular dystrophy: Secondary | ICD-10-CM

## 2020-06-16 DIAGNOSIS — Z7722 Contact with and (suspected) exposure to environmental tobacco smoke (acute) (chronic): Secondary | ICD-10-CM | POA: Diagnosis not present

## 2020-06-16 DIAGNOSIS — R140 Abdominal distension (gaseous): Secondary | ICD-10-CM | POA: Diagnosis present

## 2020-06-16 DIAGNOSIS — G40209 Localization-related (focal) (partial) symptomatic epilepsy and epileptic syndromes with complex partial seizures, not intractable, without status epilepticus: Secondary | ICD-10-CM

## 2020-06-16 LAB — CBC WITH DIFFERENTIAL/PLATELET
Abs Immature Granulocytes: 0.05 10*3/uL (ref 0.00–0.07)
Basophils Absolute: 0 10*3/uL (ref 0.0–0.1)
Basophils Relative: 0 %
Eosinophils Absolute: 0 10*3/uL (ref 0.0–0.5)
Eosinophils Relative: 0 %
HCT: 43.6 % (ref 39.0–52.0)
Hemoglobin: 14.6 g/dL (ref 13.0–17.0)
Immature Granulocytes: 1 %
Lymphocytes Relative: 23 %
Lymphs Abs: 1.6 10*3/uL (ref 0.7–4.0)
MCH: 29.7 pg (ref 26.0–34.0)
MCHC: 33.5 g/dL (ref 30.0–36.0)
MCV: 88.8 fL (ref 80.0–100.0)
Monocytes Absolute: 1 10*3/uL (ref 0.1–1.0)
Monocytes Relative: 14 %
Neutro Abs: 4.3 10*3/uL (ref 1.7–7.7)
Neutrophils Relative %: 62 %
Platelets: 252 10*3/uL (ref 150–400)
RBC: 4.91 MIL/uL (ref 4.22–5.81)
RDW: 13.5 % (ref 11.5–15.5)
WBC: 7.1 10*3/uL (ref 4.0–10.5)
nRBC: 0 % (ref 0.0–0.2)

## 2020-06-16 LAB — COMPREHENSIVE METABOLIC PANEL
ALT: 58 U/L — ABNORMAL HIGH (ref 0–44)
AST: 110 U/L — ABNORMAL HIGH (ref 15–41)
Albumin: 4.2 g/dL (ref 3.5–5.0)
Alkaline Phosphatase: 110 U/L (ref 38–126)
Anion gap: 11 (ref 5–15)
BUN: 11 mg/dL (ref 6–20)
CO2: 27 mmol/L (ref 22–32)
Calcium: 9.4 mg/dL (ref 8.9–10.3)
Chloride: 101 mmol/L (ref 98–111)
Creatinine, Ser: <0.3 mg/dL — ABNORMAL LOW (ref 0.61–1.24)
Glucose, Bld: 106 mg/dL — ABNORMAL HIGH (ref 70–99)
Potassium: 4.3 mmol/L (ref 3.5–5.1)
Sodium: 139 mmol/L (ref 135–145)
Total Bilirubin: 0.6 mg/dL (ref 0.3–1.2)
Total Protein: 7.5 g/dL (ref 6.5–8.1)

## 2020-06-16 LAB — LIPASE, BLOOD: Lipase: 27 U/L (ref 11–51)

## 2020-06-16 MED ORDER — LACTULOSE 10 GM/15ML PO SOLN: 20.0000 g | Freq: Two times a day (BID) | ORAL | 0 refills | Status: DC | PRN

## 2020-06-16 MED ORDER — SODIUM CHLORIDE 0.9 % IV BOLUS
1000.0000 mL | Freq: Once | INTRAVENOUS | Status: AC
  Administered 2020-06-16: 1000 mL via INTRAVENOUS

## 2020-06-16 MED ORDER — DOCUSATE SODIUM 150 MG/15ML PO LIQD: 100.0000 mg | Freq: Every day | ORAL | 0 refills | Status: DC

## 2020-06-16 NOTE — ED Notes (Signed)
Pt cleaned of incontinence by this nurse and Melody, Tech. Pt had Large BM at this time

## 2020-06-16 NOTE — ED Triage Notes (Signed)
Pt comes from home where mother states that pt has blood coming from around his G tube. Mother states that this occurs when pt is constipated. States she is unsure when his last BM was due to pt having a home nurse. Pt arrives and states that he had 2 BM in route to ED.

## 2020-06-16 NOTE — ED Provider Notes (Addendum)
[facility] Emergency Department Provider Note  ____________________________________________   First MD Initiated Contact with Patient 06/16/20 2118     (approximate)  I have reviewed the triage vital signs and the nursing notes.   HISTORY  Chief Complaint Blood around J-tube    HPI Lucas [name] is a 19 y.o. male with past medical history as below including severe scoliosis, seizure disorder, chronic constipation, here with reported bleeding from around his G-tube.  The patient reportedly has a history of recent episodes of increasingly severe constipation.  The patient begins to bloat, then has some bleeding around his G-tube.  He then had to be disimpacted 2-1/2 weeks ago for this, and mother believes that he did not completely get cleared out.  He reportedly has had increasing abdominal distention and constipation since then.  He began bleeding from around his G-tube today.  While on the way, he has had 3 large bowel movements and resolution of the bleeding.  Mother denies any known fevers.  He is unable to provide additional history himself.  History limited secondary to his autism and essentially nonverbal status.        Past Medical History:  Diagnosis Date  . Autism   . Autism   . Diarrhea 06/09/2015  . Family history of adverse reaction to anesthesia    MGGM- N/V  . Muscular dystrophy (HCC)   . Scoliosis   . Seizure (HCC)   . Seizure (HCC)   . Seizures (HCC)    Last one 2013  . Transient alteration of awareness   . Viral gastroenteritis     Patient Active Problem List   Diagnosis Date Noted  . Feeding by G-tube (HCC) 03/18/2019  . 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 Urinary retention 11/01/2018  . DNR (do not resuscitate) discussion 11/01/2018  . Obstructive sleep apnea syndrome 10/03/2017  . Failure to thrive (0-17) 05/24/2017  . 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  Constipation   . Post-ictal state (HCC)   . Abdominal pain   . Tachycardia 06/09/2015  . Seizure (HCC) 06/08/2015  . Autism spectrum disorder, requiring substantial support, associated with another neurodevelopmental, mental, or behavioral disorder 06/17/2014  . Vomiting 06/08/2014  . Complex care coordination 04/30/2014  . Partial epilepsy with impairment of consciousness 04/30/2014  . 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 CIRCUMCISION     at birth  . LAPAROSCOPIC GASTROSTOMY N/A 05/24/2017   Procedure: LAPAROSCOPIC GASTROSTOMY TUBE PLACEMENT;  Surgeon: Kandice Hams, MD;  Location: [name];  Service: General;  Laterality: N/A;    Prior to Admission medications   Medication Sig Start Date End Date Taking? Authorizing Provider  acetaminophen (TYLENOL) 160 MG/5ML elixir Take 15 mg/kg by mouth every 4 (four) hours as needed for fever.    [provider]  albuterol (PROVENTIL) (2.5 MG/3ML) 0.083% nebulizer solution Inhale into the lungs. 06/28/18 06/28/19  [provider]  carbamazepine (TEGRETOL) 100 MG chewable tablet TAKE ONE AND ONE-HALF TABLETS BY MOUTH TWICE A DAY 06/15/20   Deetta Perla, MD  cromolyn (OPTICROM) 4 % ophthalmic solution Place 1 drop into both eyes 4 (four) times daily as needed. 03/19/20   [name], NP  diazepam (DIASTAT ACUDIAL) 10 MG GEL Give 10 mg rectally after 2 minutes of seizures.  May repeat in 10 minutes Patient not taking: Reported on 03/27/2020 10/14/19   Deetta Perla, MD  diphenhydrAMINE (BENADRYL) 12.5 MG/5ML elixir 5 -10 ml po as needed for hives 3 times a day 05/07/19   [provider]  Docusate Sodium 150 MG/15ML syrup Take 10 mLs (100 mg total) by mouth daily. 06/16/20 07/16/20  Duffy Bruce, MD  feeding supplement, PEDIASURE 1.0 CAL WITH FIBER, (PEDIASURE ENTERAL FORMULA 1.0 CAL WITH FIBER) LIQD Place 237 mLs into feeding tube 4 (four) times daily. If he  has oral intake at school do not give a 4 pm feeding only give the water flushes if no oral intake give 4 feedings a day Patient taking differently: Place 237 mLs into feeding tube 5 (five) times daily. If he has oral intake at school do not give a 4 pm feeding only give the water flushes if no oral intake give  feedings a day 02/21/19   Mir, Gwendolyn Lima, MD  HYDROcodone-acetaminophen (HYCET) 7.5-325 mg/15 ml solution Take 15 mLs by mouth 4 (four) times daily as needed for moderate pain. 06/05/20 06/05/21  Blake Divine, MD  lactulose (CHRONULAC) 10 GM/15ML solution Place 30 mLs (20 g total) into feeding tube 2 (two) times daily as needed for severe constipation. 06/16/20   Duffy Bruce, MD  Melatonin 5 MG TABS Take 7.5 mg by mouth at bedtime.    [provider]  Polyethylene Glycol 3350 (PEG 3350) POWD TAKE 17 G BY MOUTH ONCE FOR 1 DOSE. MIX IN 4-8OUNCES OF FLUID PRIOR TO TAKING. 02/13/17   [provider]  prednisoLONE (PRELONE) 15 MG/5ML SOLN Take 9 mg by mouth daily before breakfast.    [provider]  senna (SENOKOT) 176 MG/5ML SYRP Give 5-10 ml by G tube every day to every other day to produce soft stool daily If no stool in 3 days give enema 08/22/19   Carylon Perches, MD  senna (SENOKOT) 8.6 MG TABS tablet 1 tablet (8.6 mg total) by Per NG tube route at bedtime. 05/24/20   Rudene Re, MD    Allergies Penicillins, Calcitonin, Esomeprazole magnesium, Omeprazole, and Vitamin d analogs  Family History  Problem Relation Age of Onset  . Cancer Paternal Grandfather        died at age 29  . Dementia Paternal Grandmother        died at age 74  . Other Mother        in special education, a carrier for Duchenne Muscular Dystrophy  . Hyperlipidemia Mother   . Learning disabilities Mother   . Other Maternal Uncle        Duchenne Muscular Dystrophy, was in special educations  . Other Maternal Aunt        was in special education  . Other Maternal Grandmother         a carrier for Duchenne Muscular Dystrophy/Died due to septic shock at 19 years old   . Heart failure Maternal Grandmother   . Hypertension Maternal Grandmother   . Alcohol abuse Father   . Asthma Other   . Hyperlipidemia Other   . Hypertension Other   . Vision loss Other     Social History Social History   Tobacco Use  . Smoking status: Passive Smoke Exposure - Never Smoker  . Smokeless tobacco: Never Used  Vaping Use  . Vaping Use: Never used  Substance Use Topics  . Alcohol use: No  . Drug use: No    Review of Systems  Review of Systems  Unable to perform ROS: Patient nonverbal     ____________________________________________  PHYSICAL EXAM:  VITAL SIGNS: ED Triage Vitals  Enc Vitals Group     BP 06/16/20 2102 115/75     Pulse Rate 06/16/20 2102 (!) 120     Resp 06/16/20 2102 20     Temp 06/16/20 2102 97.7 F (36.5 C)     Temp Source 06/16/20 2102 Oral     SpO2 06/16/20 2102 98 %     Weight 06/16/20 2103 85 lb 1.6 oz (38.6 kg)     Height --      Head Circumference --      Peak Flow --      Pain Score --      Pain Loc --      Pain Edu? --      Excl. in [place]? --      Physical Exam Vitals and nursing note reviewed.  Constitutional:      General: He is not in acute distress.    Appearance: He is well-developed.  HENT:     Head: Normocephalic and atraumatic.  Eyes:     Conjunctiva/sclera: Conjunctivae normal.  Cardiovascular:     Rate and Rhythm: Normal rate and regular rhythm.     Heart sounds: Normal heart sounds.  Pulmonary:     Effort: Pulmonary effort is normal. No respiratory distress.     Breath sounds: No wheezing.  Abdominal:     General: There is no distension.     Comments: G tube with small amount of nearly dried blood around it. No surrounding abdominal distension or apparent tenderness. No redness or drainage.  Musculoskeletal:     Cervical back: Neck supple.  Skin:    General: Skin is warm.     Capillary Refill: Capillary  refill takes less than 2 seconds.     Findings: No rash.  Neurological:     Mental Status: He is alert and oriented to person, place, and time.     Motor: No abnormal muscle tone.       ____________________________________________   LABS (all labs ordered are listed, but only abnormal results are displayed)  Labs Reviewed  COMPREHENSIVE METABOLIC PANEL - Abnormal; Notable for the following components:      Result Value   Glucose, Bld 106 (*)    Creatinine, Ser <0.30 (*)    AST 110 (*)    ALT 58 (*)    All other components within normal limits  CBC WITH DIFFERENTIAL/PLATELET  LIPASE, BLOOD    ____________________________________________  EKG: None ________________________________________  RADIOLOGY All imaging, including plain films, CT scans, and ultrasounds, independently reviewed by me, and interpretations confirmed via formal radiology reads.  ED MD interpretation:   KUB: Non obstructive, no signs of significant stool burden  Official radiology report(s): DG Abd Portable 2 Views  Result Date: 06/16/2020 CLINICAL DATA:  Bleeding around gastrostomy tube EXAM: PORTABLE ABDOMEN - 2 VIEW COMPARISON:  05/24/2020 FINDINGS: Fixation rods are noted in the thoracolumbar spine stable in appearance from the prior exam. Scattered large and small bowel gas is noted. No obstructive changes are seen. Button type gastrostomy tube is noted and stable. No free air is seen. The balloon tip is noted within the stomach tented anteriorly. IMPRESSION: Gastrostomy catheter in place.  No acute abnormality is noted. Electronically Signed   By: Inez Catalina M.D.   On: 06/16/2020 22:09    ____________________________________________  PROCEDURES   Procedure(s) performed (including Critical Care):  Procedures  ____________________________________________  INITIAL IMPRESSION / MDM / [place] / ED COURSE  As part  of my medical decision making, I reviewed the following data within  the [place] notes reviewed and incorporated, Old chart reviewed, Notes from prior ED visits, and [place] Controlled Substance Database       *DARION MILEWSKI was evaluated in Emergency Department on 06/17/2020 for the symptoms described in the history of present illness. He was evaluated in the context of the global COVID-19 pandemic, which necessitated consideration that the patient might be at risk for infection with the SARS-CoV-2 virus that causes COVID-19. Institutional protocols and algorithms that pertain to the evaluation of patients at risk for COVID-19 are in a state of rapid change based on information released by regulatory bodies including the CDC and federal and state organizations. These policies and algorithms were followed during the patient's care in the ED.  Some ED evaluations and interventions may be delayed as a result of limited staffing during the pandemic.*     Medical Decision Making:  19 yo M here with mild drainage/bleeding around G tube site with reported constipation and abd distension. Pt has had multiple large BMs here in ED with resolution of his bleeding/oozing as well as abd distension. He is at his baseline now. VS at baseline. No leukocytosis, fever, or signs to suggest infection. CMP shows mild transaminitis which appears chronic, possibly mildly worsened in setting of his constipation and possible dehydration.   I had a long discussion with pt's mother/caregiver re: his G tube and recurrent constipation. This seems to be an ongoing issue and she feels very comfortable managing as an outpt. I offered CT to eval for G tube location, strictures, etc. But she would like to do this as an outpatient. Given his o/w well appearance, resolution of his distension and bleeding, and reassuring labs, feel this is reasonable. Return precautions given.  ____________________________________________  FINAL CLINICAL IMPRESSION(S) / ED DIAGNOSES  Final  diagnoses:  Slow transit constipation     MEDICATIONS GIVEN DURING THIS VISIT:  Medications  sodium chloride 0.9 % bolus 1,000 mL (0 mLs Intravenous Stopped 06/17/20 0003)     ED Discharge Orders         Ordered    Docusate Sodium 150 MG/15ML syrup  Daily     Discontinue  Reprint     06/16/20 2250    lactulose (CHRONULAC) 10 GM/15ML solution  2 times daily PRN     Discontinue  Reprint     06/16/20 2250           Note:  This document was prepared using Dragon voice recognition software and may include unintentional dictation errors.   Duffy Bruce, MD 06/17/20 Mary Sella    Duffy Bruce, MD 06/17/20 [id number]

## 2020-06-16 NOTE — Telephone Encounter (Signed)
Return call to Jeani Hawking- She contacted [facility] to see if they would do the scoliosis x-rays while he was there. They would not because they are affiliated with [place] [place]. She questioned if [facility] could do them. RN adv yes they were ordered there originally and does not need an appointment. She questioned if [facility] could take them- RN advised yes they will take to any [facility] facility. She will contact them for transport on 7/9 - she has a ride to the clinic but may need it to return home. RN will confirm on Thur.

## 2020-06-16 NOTE — Progress Notes (Signed)
Needed location changed to [facility]. TG

## 2020-06-16 NOTE — Telephone Encounter (Signed)
Lucas [name] has called to follow up on Lucas [name]'s situation call back [phone], Lucas [name] will be by her phone to answer when given a call back.

## 2020-06-16 NOTE — Discharge Instructions (Addendum)
For constipation:  Continue your usual regimen  In addition, start the COLACE stool softener daily  Take the LACTULOSE up to twice a day as needed  INCREASE THE AMOUNT OF WATER Lucas [name] IS TAKING IN FOR THE NEXT FEW DAYS by at least 2-3 doses of 6 oz

## 2020-06-18 ENCOUNTER — Telehealth (INDEPENDENT_AMBULATORY_CARE_PROVIDER_SITE_OTHER): Payer: Self-pay | Admitting: Family

## 2020-06-18 ENCOUNTER — Ambulatory Visit (INDEPENDENT_AMBULATORY_CARE_PROVIDER_SITE_OTHER): Payer: Medicaid Other

## 2020-06-18 ENCOUNTER — Telehealth (INDEPENDENT_AMBULATORY_CARE_PROVIDER_SITE_OTHER): Payer: Medicaid Other | Admitting: Family

## 2020-06-18 NOTE — Telephone Encounter (Signed)
Antwine's Mom did not sign on for the video visit. I called and left a message to check on Lucas [name] and invited Mom to call back. TG

## 2020-06-19 ENCOUNTER — Other Ambulatory Visit: Payer: Self-pay

## 2020-06-19 ENCOUNTER — Ambulatory Visit
Admission: RE | Admit: 2020-06-19 | Discharge: 2020-06-19 | Disposition: A | Discharge status: Home / Self Care | Payer: Medicaid Other | Attending: Family | Admitting: Family

## 2020-06-19 ENCOUNTER — Ambulatory Visit
Admission: RE | Admit: 2020-06-19 | Discharge: 2020-06-19 | Disposition: A | Discharge status: Home / Self Care | Payer: Medicaid Other | Source: Ambulatory Visit | Attending: Family | Admitting: Family

## 2020-06-19 DIAGNOSIS — G40209 Localization-related (focal) (partial) symptomatic epilepsy and epileptic syndromes with complex partial seizures, not intractable, without status epilepticus: Secondary | ICD-10-CM | POA: Diagnosis present

## 2020-06-19 DIAGNOSIS — M4145 Neuromuscular scoliosis, thoracolumbar region: Secondary | ICD-10-CM | POA: Diagnosis present

## 2020-06-19 DIAGNOSIS — F840 Autistic disorder: Secondary | ICD-10-CM | POA: Insufficient documentation

## 2020-06-19 DIAGNOSIS — G7101 Duchenne or Becker muscular dystrophy: Secondary | ICD-10-CM | POA: Diagnosis present

## 2020-06-22 ENCOUNTER — Telehealth (INDEPENDENT_AMBULATORY_CARE_PROVIDER_SITE_OTHER): Payer: Self-pay

## 2020-06-22 NOTE — Telephone Encounter (Signed)
Call to [facility]- requested they release Spinal/scoliosis x-rays to [facility] orthopedics. She reports she can release them through [employer] to [facility] and then ortho can see them through them. Call to [facility] Dr. Joaquim Lai- [phone]- left a message for his nurse Ailene Ravel related to the above.

## 2020-06-23 NOTE — Telephone Encounter (Signed)
[facility] got the message From Judson Roch T. and x-rays. [facility] wanted to convey that everything would be taken care of on their end.

## 2020-06-24 ENCOUNTER — Telehealth (INDEPENDENT_AMBULATORY_CARE_PROVIDER_SITE_OTHER): Payer: Medicaid Other | Admitting: Pediatrics

## 2020-06-24 ENCOUNTER — Encounter (INDEPENDENT_AMBULATORY_CARE_PROVIDER_SITE_OTHER): Payer: Self-pay | Admitting: Pediatrics

## 2020-06-24 VITALS — Wt 85.0 lb

## 2020-06-24 DIAGNOSIS — F840 Autistic disorder: Secondary | ICD-10-CM

## 2020-06-24 DIAGNOSIS — G7101 Duchenne or Becker muscular dystrophy: Secondary | ICD-10-CM

## 2020-06-24 DIAGNOSIS — M4145 Neuromuscular scoliosis, thoracolumbar region: Secondary | ICD-10-CM | POA: Diagnosis not present

## 2020-06-24 DIAGNOSIS — G40209 Localization-related (focal) (partial) symptomatic epilepsy and epileptic syndromes with complex partial seizures, not intractable, without status epilepticus: Secondary | ICD-10-CM | POA: Diagnosis not present

## 2020-06-24 MED ORDER — DIAZEPAM 10 MG RE GEL: RECTAL | 5 refills | Status: DC

## 2020-06-24 MED ORDER — CARBAMAZEPINE 100 MG PO CHEW: CHEWABLE_TABLET | ORAL | 5 refills | Status: DC

## 2020-06-24 NOTE — Patient Instructions (Signed)
It was a pleasure to have a virtual visit today with you.  I am glad that Lucas [name] is stable, and there have been no new problems.  I would like to see him in 6 months.  I will be happy to see him sooner if needed.  A virtual visit is okay.  I sent prescriptions at your request for diazepam gel and also carbamazepine.  I am glad that he has been immunized for Covid.  I think it is a good decision for him to stay out of school and have virtual instruction this fall.

## 2020-06-24 NOTE — Progress Notes (Signed)
This is a Pediatric Specialist E-Visit follow up consult provided via [place] and their parent/guardian Liam Cammarata consented to an E-Visit consult today.  Location of patient: [place] is at home Location of provider: Sherron Flemings is in office Patient was referred by [name], MD   The following participants were involved in this E-Visit: patient, CMA, mother, provider  Chief Complaint/ Reason for E-Visit today: Duchenne Muscular dystrophy/Autism Total time on call: 25 Follow up: 6 months    Patient: MAXAMUS COLAO MRN: [id number] Sex: male DOB: [birth year]  Provider: Wyline Copas, MD Location of Care: [facility]  Note type: Routine return visit  History of Present Illness: Referral Source: [place] Born, MD History from: mother, patient and [facility] chart Chief Complaint: Duchenne Muscular Dystrophy/autism  TERION HEDMAN is a 19 y.o. male who was evaluated June 24, 2020 for the first time since October 14, 2019.  He has Duchenne Muscular Dystrophy, autism spectrum disorder, level 3, restrictive lung disease, compensated congestive heart failure, severe dysphagia requiring tube feeding, and a recent pathologic fracture of the proximal one third left humerus suffered June 05, 2020.  He has well-controlled seizures.  The last one being in 2013.  He is followed by a number of specialists at [facility] as well as our complex care clinic at [facility].  He is medically fragile.  He has had no infections that I know of since his last visit.  He was vaccinated with the Moderna Covid vaccine.  Everyone in his home has been vaccinated.  He has a new wheelchair.  He spends most of his day in it.  His sleeping is fragmented.  He goes to bed at 10 PM has some difficulty falling asleep but maintaining sleep once he falls asleep.  He is awakened by the nurses who come in to care for him somewhere around 8 or [place] receives 10 hours of  nursing service per day 5 days a week with a mixture of RNs and LPNs that come from [place] in [place].    His mother is not going to send him back to school at this time because of the coronavirus despite the fact that he has been vaccinated.  I do not have a problem with this because I believe that his capacity to learn is quite limited.  Review of Systems: A complete review of systems was remarkable for patient is here to be seen for Duchenne Muscular Dystrophy and autism. Mom reports that the patient has been doing well. She states that he has not had any seizures since his last visit. She reports that he has been to the hospital for a broken arm. She states that it comes from his diagnosis. She states that she has no other concerns for today's visit., all other systems reviewed and negative.  Past Medical History Diagnosis Date  . Autism   . Diarrhea 06/09/2015  . Family history of adverse reaction to anesthesia    MGGM- N/V  . Muscular dystrophy ([place])   . Scoliosis   . Seizures ([place])    Last one 2013  . Transient alteration of awareness   . Viral gastroenteritis    Hospitalizations: No., Head Injury: No., Nervous System Infections: No., Immunizations up to date: Yes.    Copied from prior chart note His last seizure occurred in late April or May 2013, and was an episode of status epilepticus. EEG showed right central diphasic sharply contoured slow-wave  activity. MRI of the brain failed to show a structural abnormality. He has been seizure-free since that time. Autism was diagnosed at age 65, diagnosis of Duchenne muscular dystrophy was made at age 71. He is wheelchair bound. He is unable to communicate.  Hospitalized due to constipation 06/08/14 until 06/11/14.  He had a split night polysomnogram on September 17, 2017, which showed significant sleep apnea with 2 episodes of obstructive apnea, 1 central apnea, and 8 hypopneas. Most of his night was spent awake. He also had light  natural sleep and deep sleep but no rapid eye movement sleep. He showed significant improvement in his episodes of apnea with CPAP pressures of 4, 6, and 8. He did not sleep on the lower doses but did on the higher dose. He was tested with a large mask and a small one and tolerated the large mask better.  Birth History 6 bs. 13 oz. Infant born at full-term to a 77 year old primigravida.  Mother gained more than 25 pounds and took medications other than vitamins and iron.  Labor lasted for 12 hours.  Normal spontaneous vaginal delivery.  The child may have had an infection in the nursery. Details are uncertain.  Growth and development was delayed for gross motor skills including pulling to stand and walking alone. He was also significantly delayed for his language.   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 CIRCUMCISION     at birth  . LAPAROSCOPIC GASTROSTOMY N/A 05/24/2017   Procedure: LAPAROSCOPIC GASTROSTOMY TUBE PLACEMENT;  Surgeon: Stanford Scotland, MD;  Location: [place];  Service: General;  Laterality: N/A;   Family History family history includes Alcohol abuse in his father; Asthma in an other family member; Cancer in his paternal grandfather; Dementia in his paternal grandmother; Heart failure in his maternal grandmother; Hyperlipidemia in his mother and another family member; Hypertension in his maternal grandmother and another family member; Learning disabilities in his mother; Other in his maternal aunt, maternal grandmother, maternal uncle, and mother; Vision loss in an other family member. Family history is negative for migraines, seizures, intellectual disabilities, blindness, deafness, birth defects, chromosomal disorder, or autism.  Social History Socioeconomic History  . Marital status: Single  . Years of education:  66  . Highest education level:  Extended  senior year  Occupational History  . Not employed  Tobacco Use  . Smoking status: Passive Smoke Exposure - Never Smoker  . Smokeless tobacco: Never Used  Vaping Use  . Vaping Use: Never used  Substance and Sexual Activity  . Alcohol use: No  . Drug use: No  . Sexual activity: Never  Social History Narrative    Antonius is a 11th grade student. His private duty nurse attends school with him.    He attends [employer].     He lives with mother.   Allergies Allergen Reactions  . Penicillins Hives and Rash    Has patient had a PCN reaction causing immediate rash, facial/tongue/throat swelling, SOB or lightheadedness with hypotension: Yes Has patient had a PCN reaction causing severe rash involving mucus membranes or skin necrosis: Yes Has patient had a PCN reaction that required hospitalization: No Has patient had a PCN reaction occurring within the last 10 years: No If all of the above answers are "NO", then may proceed with Cephalosporin use.   . Calcitonin Rash  . Esomeprazole Magnesium Nausea And Vomiting  . Omeprazole Other (See  Comments)    Constipation and possible rash after drug was stopped  . Vitamin D Analogs Other (See Comments)    Excessive urine output and thristy   Physical Exam There were no vitals taken for this visit.  General: alert, well developed, well nourished, in no acute distress, brown hair, brown eyes, right handed Head: normocephalic, no dysmorphic features Musculoskeletal: Severe muscle wasting with contractures, thoracolumbar scoliosis, treated Skin: no rashes or neurocutaneous lesions  Neurologic Exam  Mental Status: awake, unable to follow commands or speak, given the virtual nature of the visit I am not sure he was aware of my presence Cranial Nerves: visual fields are full to double simultaneous stimuli; extraocular movements are full and conjugate; pupils are round reactive to light; funduscopic examination shows bilateral  positive red reflex; symmetric, impassive facial strength; midline tongue Motor: quadriparesis with wasting, able to grip with his hands and wiggle his fingers on the right no other discernible movement  Assessment 1.  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 2.  Focal epilepsy with impairment of consciousness, G40.209. 3.  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 4.  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  Discussion Despite his fragile state, he is medically stable.  I am pleased that he has been immunized for Covid.  I believe that he would be highly susceptible to adverse outcome if he got infected.  There is no reason to change his antiepileptic medication.  I am not willing to chance breakthrough seizures.  Plan I refilled his prescription for carbamazepine and at his mother's request diazepam gel.  He will return to see me in 6 months.  Greater than 50% of a 25-minute visit was spent in counseling and coordination of care concerning his multiple neurologic conditions.   Medication List   Accurate as of June 24, 2020  9:50 AM. If you have any questions, ask your nurse or doctor.    acetaminophen 160 MG/5ML elixir Commonly known as: TYLENOL Take 15 mg/kg by mouth every 4 (four) hours as needed for fever.   albuterol (2.5 MG/3ML) 0.083% nebulizer solution Commonly known as: PROVENTIL Inhale into the lungs.   carbamazepine 100 MG chewable tablet Commonly known as: TEGRETOL TAKE ONE AND ONE-HALF TABLETS BY MOUTH TWICE A DAY   cromolyn 4 % ophthalmic solution Commonly known as: OPTICROM Place 1 drop into both eyes 4 (four) times daily as needed.   diazepam 10 MG Gel Commonly known as: DIASTAT ACUDIAL Give 10 mg rectally after 2 minutes of seizures.  May repeat in 10 minutes   diphenhydrAMINE 12.5 MG/5ML elixir Commonly known as: BENADRYL 5 -10 ml po as needed for  hives 3 times a day   Docusate Sodium 150 MG/15ML syrup Take 10 mLs (100 mg total) by mouth daily.   feeding supplement (PEDIASURE 1.0 CAL WITH FIBER) Liqd Place 237 mLs into feeding tube 4 (four) times daily. If he has oral intake at school do not give a 4 pm feeding only give the water flushes if no oral intake give 4 feedings a day What changed:   when to take this  additional instructions   HYDROcodone-acetaminophen 7.5-325 mg/15 ml solution Commonly known as: HYCET Take 15 mLs by mouth 4 (four) times daily as needed for moderate pain.   lactulose 10 GM/15ML solution Commonly known as: CHRONULAC Place 30 mLs (20 g total) into feeding tube 2 (two) times daily as needed for severe constipation.   melatonin  5 MG Tabs Take 7.5 mg by mouth at bedtime.   PEG 3350 17 GM/SCOOP Powd TAKE 17 G BY MOUTH ONCE FOR 1 DOSE. MIX IN 4-8OUNCES OF FLUID PRIOR TO TAKING.   prednisoLONE 15 MG/5ML Soln Commonly known as: PRELONE Take 9 mg by mouth daily before breakfast.   senna 176 MG/5ML Syrp Commonly known as: SENOKOT Give 5-10 ml by G tube every day to every other day to produce soft stool daily If no stool in 3 days give enema   senna 8.6 MG Tabs tablet Commonly known as: SENOKOT 1 tablet (8.6 mg total) by Per NG tube route at bedtime.    The medication list was reviewed and reconciled. All changes or newly prescribed medications were explained.  A complete medication list was provided to the patient/caregiver.  Jodi Geralds MD

## 2020-07-01 ENCOUNTER — Encounter (INDEPENDENT_AMBULATORY_CARE_PROVIDER_SITE_OTHER): Payer: Self-pay | Admitting: Family

## 2020-07-01 ENCOUNTER — Telehealth (INDEPENDENT_AMBULATORY_CARE_PROVIDER_SITE_OTHER): Payer: Medicaid Other | Admitting: Family

## 2020-07-01 DIAGNOSIS — G4733 Obstructive sleep apnea (adult) (pediatric): Secondary | ICD-10-CM | POA: Diagnosis not present

## 2020-07-01 DIAGNOSIS — G40209 Localization-related (focal) (partial) symptomatic epilepsy and epileptic syndromes with complex partial seizures, not intractable, without status epilepticus: Secondary | ICD-10-CM

## 2020-07-01 DIAGNOSIS — J984 Other disorders of lung: Secondary | ICD-10-CM | POA: Diagnosis not present

## 2020-07-01 DIAGNOSIS — G7100 Muscular dystrophy, unspecified: Secondary | ICD-10-CM

## 2020-07-01 DIAGNOSIS — G7101 Duchenne or Becker muscular dystrophy: Secondary | ICD-10-CM | POA: Diagnosis not present

## 2020-07-01 DIAGNOSIS — M4145 Neuromuscular scoliosis, thoracolumbar region: Secondary | ICD-10-CM

## 2020-07-01 DIAGNOSIS — F840 Autistic disorder: Secondary | ICD-10-CM

## 2020-07-05 ENCOUNTER — Encounter (INDEPENDENT_AMBULATORY_CARE_PROVIDER_SITE_OTHER): Payer: Self-pay | Admitting: Family

## 2020-07-05 NOTE — Progress Notes (Signed)
This is a Pediatric Specialist E-Visit follow up consult provided via MyChart Lucas [name] and his mother Luisdaniel Kenton consented to an E-Visit consult today.  Location of patient: Lucas [name] is at home Location of provider: Damita Dunnings is at office Patient was referred by Kandyce Rud, MD   The following participants were involved in this E-Visit: CMA, NP, patient and his mother  Chief Complain/ Reason for E-Visit today: Complex care follow up Total time on call: 20 min Follow up: 3 months  GREIG ALTERGOTT   MRN:  [id number]  [birth year]   Provider: [name] NP-C Location of Care: [facility]  Visit type: Routine return visit  Last visit: 03/19/2020  Referral source: Myrtice Lauth, MD History from: Epic chart and patient's mother  Brief history:  Copied from previous record: History of Duchenne Muscular Dystrophy, autism spectrum disorder, level 3, restrictive lung disease, compensated congestive heart failure, severe dysphagia requiring tube feeding, obstructive sleep apnea and a recent pathologic fracture of the proximal one third left humerus suffered June 05, 2020.  He has well-controlled seizures with the last one being in 2013.  Today's concerns: Mom reports today that Lucas [name] has been doing well except for problems with the CPAP masking giving him a rash on his face. She has called the DME agency and they are supposed to bring another mask for him to try.   Mom says that Lucas [name] is wearing a brace for his fractured humerus but it is difficult for me to see it in the video. He is being followed by [facility] orthopedics for that and his scoliosis.   Mom reports that Lucas [name] received a Covid 19 vaccine and did well with that. She is not going to send him to school this fall despite being vaccinated.   [name] was last seen by Dr Sharene Skeans on June 24, 2020 for his seizure disorder.   Mom reports that Lucas [name] is tolerating his feedings  well, and that he has not had recent problems with diarrhea or constipation.  He has been otherwise generally healthy since he was last seen. Mom has no other health concerns for Lucas [name] today other than previously mentioned.  Review of systems: Please see HPI for neurologic and other pertinent review of systems. Otherwise all other systems were reviewed and were negative.  Problem List: Patient Active Problem List   Diagnosis Date Noted  . Feeding by G-tube (HCC) 03/18/2019  . 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 Urinary retention 11/01/2018  . DNR (do not resuscitate) discussion 11/01/2018  . Obstructive sleep apnea syndrome 10/03/2017  . Failure to thrive (0-17) 05/24/2017  . 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 Constipation   . Post-ictal state (HCC)   . Abdominal pain   . Tachycardia 06/09/2015  . Seizure (HCC) 06/08/2015  . Autism spectrum disorder with accompanying language impairment and intellectual disability, requiring very substantial support 06/17/2014  . Vomiting 06/08/2014  . Complex care coordination 04/30/2014  . Partial epilepsy with impairment of consciousness 04/30/2014  . Duchenne muscular dystrophy 04/30/2014     Past Medical History:  Diagnosis Date  . Autism   . Autism   . Diarrhea 06/09/2015  . Family history of adverse reaction to anesthesia    MGGM- N/V  . Muscular dystrophy (HCC)   . Scoliosis   . Seizure (HCC)   . Seizure (HCC)   . Seizures (HCC)    Last one 2013  . Transient alteration of  awareness   . Viral gastroenteritis     Past medical history comments: See HPI Copied from previous record: His last seizure occurred in late April or May 2013, and was an episode of status epilepticus. EEG showed right central diphasic sharply contoured slow-wave activity. MRI of the brain failed to show a structural abnormality. He has been seizure-free since that time. Autism was diagnosed at  age 82, diagnosis of Duchenne muscular dystrophy was made at age 56. He is wheelchair bound. He is unable to communicate.  Hospitalized due to constipation 06/08/14 until 06/11/14.  He had a split night polysomnogram on September 17, 2017, which showed significant sleep apnea with 2 episodes of obstructive apnea, 1 central apnea, and 8 hypopneas. Most of his night was spent awake. He also had light natural sleep and deep sleep but no rapid eye movement sleep. He showed significant improvement in his episodes of apnea with CPAP pressures of 4, 6, and 8. He did not sleep on the lower doses but did on the higher dose. He was tested with a large mask and a small one and tolerated the large mask better.  Birth History 6 bs. 13 oz. Infant born at full-term to a 70 year old primigravida.  Mother gained more than 25 pounds and took medications other than vitamins and iron.  Labor lasted for 12 hours.  Normal spontaneous vaginal delivery.  The child may have had an infection in the nursery. Details are uncertain.  Growth and development was delayed for gross motor skills including pulling to stand and walking alone. He was also significantly delayed for his language.   Behavior History Autism spectrum disorder with intellectual disability and severe language disorder   Surgical history: Past Surgical History:  Procedure Laterality Date  . CIRCUMCISION     at birth  . LAPAROSCOPIC GASTROSTOMY N/A 05/24/2017   Procedure: LAPAROSCOPIC GASTROSTOMY TUBE PLACEMENT;  Surgeon: Stanford Scotland, MD;  Location: [facility];  Service: General;  Laterality: N/A;  . SPINE SURGERY N/A    Phreesia 06/29/2020     Family history: family history includes Alcohol abuse in his father; Asthma in an other family member; Cancer in his paternal grandfather; Dementia in his paternal grandmother; Heart failure in his maternal grandmother; Hyperlipidemia in his mother and another family member; Hypertension in his  maternal grandmother and another family member; Learning disabilities in his mother; Other in his maternal aunt, maternal grandmother, maternal uncle, and mother; Vision loss in an other family member.   Social history: Social History   Socioeconomic History  . Marital status: Single    Spouse name: Not on file  . Number of children: Not on file  . Years of education: Not on file  . Highest education level: Not on file  Occupational History  . Not on file  Tobacco Use  . Smoking status: Passive Smoke Exposure - Never Smoker  . Smokeless tobacco: Never Used  Vaping Use  . Vaping Use: Never used  Substance and Sexual Activity  . Alcohol use: No  . Drug use: No  . Sexual activity: Never  Other Topics Concern  . Not on file  Social History Narrative   Jasin is a 12th grade student. His private duty nurse attends school with him.   He attends [employer].    He lives with mother.   Social Determinants of Health   Financial Resource Strain:   . Difficulty of Paying Living Expenses:   Food Insecurity:   . Worried About  Running Out of Food in the Last Year:   . [place] in the Last Year:   Transportation Needs:   . Lack of Transportation (Medical):   Marland Kitchen Lack of Transportation (Non-Medical):   Physical Activity:   . Days of Exercise per Week:   . Minutes of Exercise per Session:   Stress:   . Feeling of Stress :   Social Connections:   . Frequency of Communication with Friends and Family:   . Frequency of Social Gatherings with Friends and Family:   . Attends Religious Services:   . Active Member of Clubs or Organizations:   . Attends Archivist Meetings:   Marland Kitchen Marital Status:   Intimate Partner Violence:   . Fear of Current or Ex-Partner:   . Emotionally Abused:   Marland Kitchen Physically Abused:   . Sexually Abused:       Past/failed meds:   Allergies: Allergies  Allergen Reactions  . Penicillins Hives and Rash    Has patient had a PCN  reaction causing immediate rash, facial/tongue/throat swelling, SOB or lightheadedness with hypotension: Yes Has patient had a PCN reaction causing severe rash involving mucus membranes or skin necrosis: Yes Has patient had a PCN reaction that required hospitalization: No Has patient had a PCN reaction occurring within the last 10 years: No If all of the above answers are "NO", then may proceed with Cephalosporin use.   . Calcitonin Rash  . Esomeprazole Magnesium Nausea And Vomiting  . Omeprazole Other (See Comments)    Constipation and possible rash after drug was stopped  . Vitamin D Analogs Other (See Comments)    Excessive urine output and thristy      Immunizations: Immunization History  Administered Date(s) Administered  . Moderna SARS-COVID-2 Vaccination 03/24/2020, 03/24/2020, 04/20/2020      Diagnostics/Screenings: 06/20/2020 - x-ray - scoliosis evaluation - 30 degrees generalized convex rightward scoliosis in the thoracolumbar spine. Postoperative changes from scoliosis repair.  Physical Exam: There were no vitals taken for this visit.  General: well developed, well nourished young man, seated at home with his mother, in no evident distress; brown hair, brown eyes, right hand Head: normocephalic and atraumatic. No dysmorphic features. Musculoskeletal: Severe muscle wasting with contractures, neuromuscular scoliosis Skin: no rashes or neurocutaneous lesions  Neurologic Exam Mental Status: Awake and fully alert. Has no language.  Smiles at times. Cranial Nerves: Turns to localize faces and objects in the periphery. Turns to localize sounds in the periphery. Facial movements are symmetric. 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  Impression: 1. Duchenne muscular dystrophy 2. 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 Autism 4. Obstructive sleep apnea 5. Neuromuscular  scoliosis 6. Recent humerus fracture  Recommendations for plan of care: The patient's previous [facility] records were reviewed. Gurinder has neither had nor required imaging or lab studies since the last visit other than x-rays for his arm fracture and his scoliosis. He is a 19 year old young man with history of Duchenne muscular dystrophy, autism, focal epilepsy with impairment of consciousness, obstructive sleep apnea, neuromuscular scoliosis and recent humerus fracture. I talked with Mom about the recent scoliosis x-ray and instructed her to call [facility] orthopedics to follow up about that. I instructed her to call the CPAP DME agency again about the CPAP mask that is causing skin irritation to Enzo's face. We will see him in follow up in October 2021 or sooner if  needed. Mom agreed with the plans made today.  The medication list was reviewed and reconciled. No changes were made in the prescribed medications today. A complete medication list was provided to the patient.  Allergies as of 07/01/2020      Reactions   Penicillins Hives, Rash   Has patient had a PCN reaction causing immediate rash, facial/tongue/throat swelling, SOB or lightheadedness with hypotension: Yes Has patient had a PCN reaction causing severe rash involving mucus membranes or skin necrosis: Yes Has patient had a PCN reaction that required hospitalization: No Has patient had a PCN reaction occurring within the last 10 years: No If all of the above answers are "NO", then may proceed with Cephalosporin use.   Calcitonin Rash   Esomeprazole Magnesium Nausea And Vomiting   Omeprazole Other (See Comments)   Constipation and possible rash after drug was stopped   Vitamin D Analogs Other (See Comments)   Excessive urine output and thristy      Medication List       Accurate as of July 01, 2020 11:59 PM. If you have any questions, ask your nurse or doctor.        acetaminophen 160 MG/5ML elixir Commonly known as: TYLENOL Take  15 mg/kg by mouth every 4 (four) hours as needed for fever.   albuterol (2.5 MG/3ML) 0.083% nebulizer solution Commonly known as: PROVENTIL Inhale into the lungs.   carbamazepine 100 MG chewable tablet Commonly known as: TEGRETOL TAKE ONE AND ONE-HALF TABLETS BY MOUTH TWICE A DAY   cromolyn 4 % ophthalmic solution Commonly known as: OPTICROM Place 1 drop into both eyes 4 (four) times daily as needed.   diazepam 10 MG Gel Commonly known as: DIASTAT ACUDIAL Give 10 mg rectally after 2 minutes of seizures.  May repeat in 10 minutes   diphenhydrAMINE 12.5 MG/5ML elixir Commonly known as: BENADRYL 5 -10 ml po as needed for hives 3 times a day   Docusate Sodium 150 MG/15ML syrup Take 10 mLs (100 mg total) by mouth daily.   feeding supplement (PEDIASURE 1.0 CAL WITH FIBER) Liqd Place 237 mLs into feeding tube 4 (four) times daily. If he has oral intake at school do not give a 4 pm feeding only give the water flushes if no oral intake give 4 feedings a day What changed:   when to take this  additional instructions   HYDROcodone-acetaminophen 7.5-325 mg/15 ml solution Commonly known as: HYCET Take 15 mLs by mouth 4 (four) times daily as needed for moderate pain.   lactulose 10 GM/15ML solution Commonly known as: CHRONULAC Place 30 mLs (20 g total) into feeding tube 2 (two) times daily as needed for severe constipation.   melatonin 5 MG Tabs Take 7.5 mg by mouth at bedtime.   PEG 3350 17 GM/SCOOP Powd TAKE 17 G BY MOUTH ONCE FOR 1 DOSE. MIX IN 4-8OUNCES OF FLUID PRIOR TO TAKING.   prednisoLONE 15 MG/5ML Soln Commonly known as: PRELONE Take 9 mg by mouth daily before breakfast.   prednisoLONE 15 MG/5ML solution Commonly known as: ORAPRED SMARTSIG:3 Milliliter(s) Gastro Tube Daily   senna 176 MG/5ML Syrp Commonly known as: SENOKOT Give 5-10 ml by G tube every day to every other day to produce soft stool daily If no stool in 3 days give enema   senna 8.6 MG Tabs  tablet Commonly known as: SENOKOT 1 tablet (8.6 mg total) by Per NG tube route at bedtime.       Total time spent with the  patient was 20 minutes, of which 50% or more was spent in counseling and coordination of care.  Rockwell Germany NP-C [facility] Ph. [phone] Fax [phone]

## 2020-07-05 NOTE — Patient Instructions (Signed)
Thank you for meeting with me by video  today.   Instructions for you until your next appointment are as follows: 1. Be sure to call [facility] orthopedics to follow up on the scoliosis x-ray 2. Call the DME company back about [facility] CPAP mask. 3. Please sign up for MyChart if you have not done so 4. Please plan to return for follow up in October or sooner if needed.

## 2020-07-06 ENCOUNTER — Telehealth (INDEPENDENT_AMBULATORY_CARE_PROVIDER_SITE_OTHER): Payer: Self-pay | Admitting: Family

## 2020-07-06 NOTE — Telephone Encounter (Signed)
I called and spoke to Mom. She wants to stop Melatonin for the summer and restart it in the winter. I explained to Mom that the variation in heart rate was normal and that it was likely not due to Melatonin. Mom insisted on stopping the Melatonin so I will the order in to [facility]. TG

## 2020-07-06 NOTE — Addendum Note (Signed)
Addended by: Joelyn Oms on: 07/06/2020 05:04 PM   Modules accepted: Orders

## 2020-07-06 NOTE — Telephone Encounter (Signed)
Who's calling (name and relationship to patient) : Lucas [name] (mom)  Best contact number: [phone]  Provider they see: Rockwell Germany   Reason for call:  Mom called in stating that Lucas [name] has been taking melatonin for 2+ years but this past weekend had a reaction which cause his heart rate to fluctuate from 77-92bpm. Mom states that after researching she would under the impression that melatonin needs to be taken for 2 months at a time and then off for 2 months. Mom states that the order needs to discontinue the melatonin and this be sent to his home health agency as well so they no longer give this. She would like to resume melatonin "in the winter around January". Please advise.  Call ID:      PRESCRIPTION REFILL ONLY  Name of prescription:  Pharmacy:

## 2020-07-13 ENCOUNTER — Telehealth (INDEPENDENT_AMBULATORY_CARE_PROVIDER_SITE_OTHER): Payer: Self-pay | Admitting: Family

## 2020-07-13 DIAGNOSIS — K5900 Constipation, unspecified: Secondary | ICD-10-CM

## 2020-07-13 MED ORDER — DOCUSATE SODIUM 150 MG/15ML PO LIQD: 100.0000 mg | Freq: Every day | ORAL | 5 refills | Status: AC

## 2020-07-13 NOTE — Telephone Encounter (Signed)
°  Who's calling (name and relationship to patient) :Mom / Holley Bouche   Best contact [phone]  Provider they ZRV:UFC[facility]   Reason for call:Medication Refill      PRESCRIPTION REFILL ONLY  Name of prescription: Docusate Sodium $RemoveBeforeD'50MG'[id number]$    Pharmacy:[facility], [place]

## 2020-07-20 ENCOUNTER — Telehealth (INDEPENDENT_AMBULATORY_CARE_PROVIDER_SITE_OTHER): Payer: Self-pay | Admitting: Pediatrics

## 2020-07-20 NOTE — Telephone Encounter (Signed)
Who's calling (name and relationship to patient) : [facility] spoke with Christell Faith contact number: [phone]  Provider they see: [name]  Reason for call: [facility] faxed over [place] form and entro form that needed to be signed and dated by [name]. They were calling to confirm this paperwork had been received. Please reach out if the paperwork has not been received.   Call ID:      PRESCRIPTION REFILL ONLY  Name of prescription:  Pharmacy:

## 2020-07-21 NOTE — Telephone Encounter (Signed)
I signed a certification of disability yesterday. I have not seen a CMN for Lucas [name]. TG

## 2020-07-24 NOTE — Telephone Encounter (Signed)
Forms have been faxed 

## 2020-08-27 ENCOUNTER — Telehealth (INDEPENDENT_AMBULATORY_CARE_PROVIDER_SITE_OTHER): Payer: Self-pay | Admitting: Dietician

## 2020-08-27 NOTE — Telephone Encounter (Signed)
RD returned call to mom, okay with starting feeds later.  New schedule:  8:30-9:30 AM - 237 mL @ 237 mL/hr  11:30-12:30 PM - 237 mL @ 237 mL/hr  2:30-3:30 PM - 237 mL @ 237 mL/hr  5:50-6:30 PM - 237 mL @ 237 mL/hr  8:30-9:30 PM - 237 mL @ 237 mL/hr FWF: continue 100 mL before and after each feed

## 2020-08-27 NOTE — Telephone Encounter (Signed)
  Who's calling (name and relationship to patient) : Olivia Mackie ( mom)  Best contact number: [phone]  Provider they see: Estanislado Spire  Reason for call: Mom called she is having trouble getting up and doesn't have a nurse in the morning to do the first feeding at 7:30 and wants to change the time to 8:30 and wanted to make sure that was ok with you     PRESCRIPTION REFILL ONLY  Name of prescription:  Pharmacy:

## 2020-08-28 NOTE — Telephone Encounter (Signed)
Order faxed to [facility]

## 2020-09-09 NOTE — Progress Notes (Signed)
Patient: Lucas [name] MRN: [id number] Sex: male DOB: 11-Aug-2001  Provider: Carylon Perches, MD Location of Care: Pediatric Specialist- Pediatric Complex Care Note type: Routine return visit  History of Present Illness: Referral Source: Lucas Late, MD History from: patient and prior records Chief Complaint: Routine return visit   Lucas [name] is a 19 y.o. male with history of Duchenne Muscular Dystrophy, autism spectrum disorder, level 3, restrictive lung disease, compensated congestive heart failure, severe dysphagia requiring tube feeding, obstructive sleep apnea and a recent pathologic fracture of the proximal one third left humerus  who I am seeing in follow-up for complex care management. Patient was last seen 07/01/20 by Lucas [name]. At the time he had problems with his CPAP masking which gave him a rash on his face. Mother called DME agency to obtain another mask. Pt has a cast for a recent humerus fx and is followed by [name] orthopedics. Since that appointment, patient has had a follow up appointment with [facility] orthopedics.   Patient presents today with mother They report their largest concern is   Symptom management:   Seizures: No seizure activity.   Respiratory: Had problems with CPAP machine moving constantly and skin irritation. Mother has seen no issues with his breathing.   Duchenne Muscular Dystrophy: was on 5 ml of Oraped. Now he is getting 3 ml.  School: Attends [employer] virtually. He is doing well with his teacher.   Feeding: Not eating by mouth, takes tube feeds. Will eat baby food occasionally.   Sleep:Takes a few minutes for him to fall asleep and will sleep throughout the night. Mother would like to discontinue Melatonin.   Care coordination (other providers): Followed by orthopedics for humerus fx. Will be seeing [name] at [facility] for scoliosis.   Stool: Has a BM daily. When he is constipated mother gives him Lactulose or Miralax  mixed with prune juice.  Care management needs: Frances [name] PT once a month. Mother would like to have more sessions.   Past Medical History Past Medical History:  Diagnosis Date  . Autism   . Autism   . Diarrhea 06/09/2015  . Family history of adverse reaction to anesthesia    MGGM- N/V  . Muscular dystrophy ([place])   . Scoliosis   . Seizure ([place])   . Seizure ([place])   . Seizures ([place])    Last one 2013  . Transient alteration of awareness   . Viral gastroenteritis     Surgical History Past Surgical History:  Procedure Laterality Date  . CIRCUMCISION     at birth  . LAPAROSCOPIC GASTROSTOMY N/A 05/24/2017   Procedure: LAPAROSCOPIC GASTROSTOMY TUBE PLACEMENT;  Surgeon: Stanford Scotland, MD;  Location: [place];  Service: General;  Laterality: N/A;  . SPINE SURGERY N/A    Phreesia 06/29/2020    Family History family history includes Alcohol abuse in his father; Asthma in an other family member; Cancer in his paternal grandfather; Dementia in his paternal grandmother; Heart failure in his maternal grandmother; Hyperlipidemia in his mother and another family member; Hypertension in his maternal grandmother and another family member; Learning disabilities in his mother; Other in his maternal aunt, maternal grandmother, maternal uncle, and mother; Vision loss in an other family member.   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   He attends [employer].    He lives with mother.  Allergies Allergies  Allergen Reactions  . Penicillins Hives and Rash    Has patient had a PCN reaction causing immediate rash, facial/tongue/throat swelling, SOB or lightheadedness with hypotension: Yes Has patient had a PCN reaction causing severe rash involving mucus membranes or skin necrosis: Yes Has patient had a PCN reaction that required hospitalization: No Has patient had a PCN  reaction occurring within the last 10 years: No If all of the above answers are "NO", then may proceed with Cephalosporin use.   . Calcitonin Rash  . Esomeprazole Magnesium Nausea And Vomiting  . Omeprazole Other (See Comments)    Constipation and possible rash after drug was stopped  . Vitamin D Analogs Other (See Comments)    Excessive urine output and thristy    Medications Current Outpatient Medications on File Prior to Visit  Medication Sig Dispense Refill  . acetaminophen (TYLENOL) 160 MG/5ML elixir Take 15 mg/kg by mouth every 4 (four) hours as needed for fever.    Marland Kitchen albuterol (PROVENTIL) (2.5 MG/3ML) 0.083% nebulizer solution Inhale into the lungs.    . carbamazepine (TEGRETOL) 100 MG chewable tablet TAKE ONE AND ONE-HALF TABLETS BY MOUTH TWICE A DAY 93 tablet 5  . diazepam (DIASTAT ACUDIAL) 10 MG GEL Give 10 mg rectally after 2 minutes of seizures.  May repeat in 10 minutes 2 each 5  . diphenhydrAMINE (BENADRYL) 12.5 MG/5ML elixir 5 -10 ml po as needed for hives 3 times a day    . feeding supplement, PEDIASURE 1.0 CAL WITH FIBER, (PEDIASURE ENTERAL FORMULA 1.0 CAL WITH FIBER) LIQD Place 237 mLs into feeding tube 4 (four) times daily. If he has oral intake at school do not give a 4 pm feeding only give the water flushes if no oral intake give 4 feedings a day (Patient taking differently: Place 237 mLs into feeding tube 5 (five) times daily. If he has oral intake at school do not give a 4 pm feeding only give the water flushes if no oral intake give  feedings a day) 155 mL 5  . senna (SENOKOT) 8.6 MG TABS tablet 1 tablet (8.6 mg total) by Per NG tube route at bedtime. 120 tablet 1  . cromolyn (OPTICROM) 4 % ophthalmic solution Place 1 drop into both eyes 4 (four) times daily as needed. (Patient not taking: Reported on 09/10/2020) 10 mL 3  . HYDROcodone-acetaminophen (HYCET) 7.5-325 mg/15 ml solution Take 15 mLs by mouth 4 (four) times daily as needed for moderate pain. (Patient not  taking: Reported on 09/10/2020) 120 mL 0   No current facility-administered medications on file prior to visit.   The medication list was reviewed and reconciled. All changes or newly prescribed medications were explained.  A complete medication list was provided to the patient/caregiver.  Physical Exam BP 102/64   Pulse 100   Temp (!) 96.9 F (36.1 C) (Temporal)   Resp (!) 32   Wt 90 lb 9.6 oz (41.1 kg)   SpO2 98%   BMI 18.44 kg/m  Weight for age: <1 %ile (Z= -4.32) based on CDC (Boys, 2-20 Years) weight-for-age data using vitals from 09/10/2020.  Length for age: No height on file for this encounter. BMI: Body mass index is 18.44 kg/m. No exam data present Gen: well appearing child Skin: No rash, No neurocutaneous stigmata. HEENT: Normocephalic, no dysmorphic features, no conjunctival injection, nares patent, mucous membranes moist, oropharynx clear. Neck: Supple, no meningismus. No focal tenderness. Resp: Clear to auscultation bilaterally CV: Regular rate, normal S1/S2, no murmurs,  no rubs Abd: BS present, abdomen soft, non-tender, non-distended. No hepatosplenomegaly or mass Ext: Warm and well-perfused. Contractures in multiple joints, especially in feet.   Neurological Examination: MS: Awake, alert, interactive. Poor eye contact, limited reaction to examiner.  Cranial Nerves: Pupils were equal and reactive to light;  EOM normal, no nystagmus; no ptsosis, no double vision, intact facial sensation, face symmetric with full strength of facial muscles, hearing intact grossly.  Motor-Low tone throughout, 3/5 strength in upper and lower extremities, unable to lift against gravity.   Reflexes- Reflexes minimal Sensation: Intact to light touch throughout.   Coordination: Does not reach for objects  Diagnosis:  1. Partial epilepsy with impairment of consciousness   2. Duchenne muscular dystrophy   3. Neuromuscular scoliosis of thoracolumbar region   4. Complex care coordination    5. Obstructive sleep apnea syndrome   6. 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 Gastrostomy tube dependent ([place])   8. Constipation, unspecified constipation type   9. Contracture of multiple joints   10. Medication monitoring encounter      Assessment and Plan Lucas [name] is a 19 y.o. male with history of Duchenne Muscular Dystrophy, autism spectrum disorder, level 3, restrictive lung disease, compensated congestive heart failure, severe dysphagia requiring tube feeding, obstructive sleep apnea and a recent pathologic fracture of the proximal one third left humerus  who presents for follow-up in the pediatric complex care clinic. I am impressed with Roczen's status today, as he is fairly stable sespite being home the last 1.5 years. During today's visit, I discussed with mother to keep all upcoming appointments with other specialists to discuss any concerns further. In particular, recommend follow-up with MDA clinic to discuss possible new gene therapy treatments for Duchennes/  I am uncertain of his specific genetic testing to know whether he may qualify.  In the meantime I am worried that he is underdosed on $RemoveBefor'9mg'IUmIiPVgJEfX$  Orapred, which is underdosed.  Mother reports concern for poor sleep at $Remove'15mg'cxarsob$  daily, so settled on $RemoveBe'12mg'iMqCoUxFu$  daily.  Ispent a fair amount of time today creating a regime for mother and nursing to follow for bowel regimen.  He has aged out from pediatric GI but discussed withmother that as long as he doesn't have GI complications, I am comfortable managing his current medications. Patient seen by case manager, dietician,today as well, please see accompanying notes.  I discussed case with all involved parties for coordination of care and recommend patient follow their instructions as below.   Symptom management:  Stop melatonin.  Mother to call if he has trouble falling asleep or staying asleep.  Increase Orapred to 83ml daily Bowel regimen defined as this:  Give Miralax  17g daily in a feeding bag Give 84ml of prune juice- with no water.  Give 183ml water flush Give feeding Give 147ml water flush Give ducosate $RemoveBefor'100mg'bakzMglBZBqx$  daily If he hasn't pooped in 2 days, give Lactulose  Care coordination: Follow-up with Dr [place] Cellar virtually. Case manager to discuss with him regarding CPAP mask.  Follow-up with Dr Joaquim Lai virtually regarding scoliosis.  Appears stable.  Follow-up with Dr Edison Simon virtually to talk about new treatment for Duchenne  Care management needs:  Increase PT to once weekly and start daily exercise regimen. Labwork done today given limited labwork over last 1.5 years.  All new orders provided for nursing.    I spend 54 minutes on day of service on this patient including discussion with patient and family, coordination with other providers,  and review of chart  The CARE PLAN for reviewed and revised to represent the changes above.  This is available in Epic under snapshot, and a physical binder provided to the patient, that can be used for anyone providing care for the patient.   Return in about 6 months (around 03/10/2021).  Carylon Perches MD MPH Neurology,  Neurodevelopment and Neuropalliative care [facility] Child Neurology  [place], [place],  [address] Phone: [phone]  By signing below, I, Trina Ao attest that this documentation has been prepared under the direction of Carylon Perches, MD.   I, Carylon Perches, MD personally performed the services described in this documentation. All medical record entries made by the scribe were at my direction. I have reviewed the chart and agree that the record reflects my personal performance and is accurate and complete Electronically signed by Trina Ao and Carylon Perches, MD 09/11/20 11:28 PM

## 2020-09-10 ENCOUNTER — Other Ambulatory Visit: Payer: Self-pay

## 2020-09-10 ENCOUNTER — Ambulatory Visit (INDEPENDENT_AMBULATORY_CARE_PROVIDER_SITE_OTHER): Payer: Medicaid Other

## 2020-09-10 ENCOUNTER — Ambulatory Visit (INDEPENDENT_AMBULATORY_CARE_PROVIDER_SITE_OTHER): Payer: Medicaid Other | Admitting: Pediatrics

## 2020-09-10 ENCOUNTER — Encounter (INDEPENDENT_AMBULATORY_CARE_PROVIDER_SITE_OTHER): Payer: Self-pay | Admitting: Pediatrics

## 2020-09-10 ENCOUNTER — Ambulatory Visit (INDEPENDENT_AMBULATORY_CARE_PROVIDER_SITE_OTHER): Payer: Medicaid Other | Admitting: Dietician

## 2020-09-10 VITALS — Ht 58.78 in

## 2020-09-10 VITALS — BP 102/64 | HR 100 | Temp 96.9°F | Resp 32 | Wt 90.6 lb

## 2020-09-10 DIAGNOSIS — Z23 Encounter for immunization: Secondary | ICD-10-CM

## 2020-09-10 DIAGNOSIS — G7100 Muscular dystrophy, unspecified: Secondary | ICD-10-CM

## 2020-09-10 DIAGNOSIS — J984 Other disorders of lung: Secondary | ICD-10-CM

## 2020-09-10 DIAGNOSIS — Z7189 Other specified counseling: Secondary | ICD-10-CM | POA: Diagnosis not present

## 2020-09-10 DIAGNOSIS — Z931 Gastrostomy status: Secondary | ICD-10-CM

## 2020-09-10 DIAGNOSIS — G40209 Localization-related (focal) (partial) symptomatic epilepsy and epileptic syndromes with complex partial seizures, not intractable, without status epilepticus: Secondary | ICD-10-CM

## 2020-09-10 DIAGNOSIS — M2450 Contracture, unspecified joint: Secondary | ICD-10-CM

## 2020-09-10 DIAGNOSIS — G7101 Duchenne or Becker muscular dystrophy: Secondary | ICD-10-CM | POA: Diagnosis not present

## 2020-09-10 DIAGNOSIS — K5900 Constipation, unspecified: Secondary | ICD-10-CM

## 2020-09-10 DIAGNOSIS — G4733 Obstructive sleep apnea (adult) (pediatric): Secondary | ICD-10-CM

## 2020-09-10 DIAGNOSIS — Z5181 Encounter for therapeutic drug level monitoring: Secondary | ICD-10-CM

## 2020-09-10 DIAGNOSIS — M4145 Neuromuscular scoliosis, thoracolumbar region: Secondary | ICD-10-CM | POA: Diagnosis not present

## 2020-09-10 MED ORDER — PEG 3350 17 GM/SCOOP PO POWD: 17.0000 g | Freq: Every day | ORAL | 5 refills | Status: DC

## 2020-09-10 MED ORDER — PREDNISOLONE SODIUM PHOSPHATE 15 MG/5ML PO SOLN: 12.0000 mg | Freq: Every day | ORAL | 5 refills | Status: DC

## 2020-09-10 MED ORDER — DOCU 50 MG/5ML PO LIQD: 100.0000 mg | Freq: Every day | ORAL | 5 refills | Status: DC

## 2020-09-10 MED ORDER — LACTULOSE 10 GM/15ML PO SOLN: 20.0000 g | Freq: Every day | ORAL | 3 refills | Status: DC | PRN

## 2020-09-10 NOTE — Progress Notes (Signed)
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   Neck: Supple, no meningismus. No focal tenderness.  Resp: normal work of breathing.   CV: well perfused-05/11/18 - normal biventricular size and function  Abd: Abdomen non-distended. G-tube c/d/i. No hepatosplenomegaly or mass, history of reflux and constipation  Ext: No deformities, mod muscle wasting, ROM full.  MS: Awake, alert.  Nonverbal, but interactive with mother. Smiles responsively and appears to follow simple commands or to understand at times.    Cranial Nerves: No nystagmus; no ptsosis, face symmetric with full strength of facial muscles, hearing grossly intact, palate elevation is symmetric.  Motor- Able to move arms at least antigravity. No abnormal movements  Sensation: Responds to touch in all extremities.   Coordination: Does not reach for objects.   Gait: wheelchair dependent.    Neurologic - severe generalized weakness with wheelchair dependence, significant intellectual deficit, no language, dependent upon caregivers for all ADL's  Pulmonary - has sleep apnea, uses CPAP at night  GI - has dysphagia with gastrostomy tube dependence (14 fr 2.0 cm Mini-one)  and purees or Gerber pouches by mouth for pleasure  Incontinent of stool and urine  Guardians/Caregivers: [name] (mother) ph [phone] [facility]  Recent Events:  Closed fracture left humerus 05/29/20  ER 6/13- fecal impaction  Upcoming Plans:  09/17/2020 1:00 PM Dr. Marry Guan  11/19:2021  10:00 AM Dr. Koloa Cellar Pulmonology  12/01/2020 at 9:30 AM Cardiology  Follow up with Dr. Edison Simon- about new medication trial for Duchenne's  Needs follow up visit with Dr. [place] Cellar in Oct. And follow up sleep study with CPAP and transcutaneous CO2 monitoring  Cardiology 12/01/20 at 9:30 AM [facility] office [phone]; [place] office [phone])   Feeding: Last updated: 09/10/2020 DME: [facility] - fax [phone] Formula: Pediasure 1.0 + Fiber - 5 cans daily  Current regimen:  Day feeds: 237 mL @ 237 mL/hr x 5 feeds @ 6 AM, 9 AM, 12 PM, 3 PM, and 6 PM Overnight feeds: none  FWF: 100 mL before and after each feed (1000 mL total)  PO: limited - mom to offer a baby food pouch after feeds if pt reports being hungry Supplements: none  Past/failed meds:  Mom feels reflux medications caused vomiting .DOES NOT TOLERATE any reflux meds  Intolerant of Ca and Vitamin D supplements- May need to re-try due to Osteo- bone  Melatonin- possible rash and bradycardia  Providers:  [name], MD,  ([place]) ph [phone] fax [phone]  [name], MD [facility] cardiology) ph [phone] fax [phone]-- u  [name], MD [facility] orthopedics) ph  fax [phone]  [name], MD [facility] pulmonology) ph [phone] fax [phone]  [name], MD (  Neurology/Muscular dystrophy clinic at [facility]) ph [phone] fax [phone], [facility]) ph [phone] fax [phone]  Estanislado Spire, [place] ([facility] dietitian) ph [phone] fax [phone]  Pat Patrick, MD (Pediatric Pulmonology at [facility]) [phone] Fax: [phone]  [name], MD ([place] in [place]) ph. [phone] Fax [phone] (following for fx arm)  Community support/services:  [facility] ph [phone] Trixie Deis, PT- 1 x a month  [place] Has PDN 84 hours per week Has respite hours - [place]- ph [phone], fax [phone]  CAP/C  [facility] caseworker -Amy Baker with Daun Peacock, RN -[phone]  CAP-C  ph [phone] fax [phone]  Dorian Pod and Earney Hamburg (cousins)    Grandmother's old church members call sometimes.  Nurse interested in getting into rec center.    MDA society not helpful to mom  Equipment:  [place]- [phone] CPAP at 11,Nebulizer, [place] and Mobility-Travis Minette Brine [phone] [facility] lift, Sleep safe bed, Wheelchair  05/2019, [place] chair may need knew one due to length  [place]-Respiratory vest,Cough assist machine- BID- QID  [place] ([place])-ph: [phone] fax: [phone] Incontinence supplies, Formula, Kangaroo, G-tube Mini-one Pediasure with Fiber 1.0, (contact - "Jackie") 14 fr 2.3 cm Mini One button  Hand splints bilaterally- Left is worse than right   **Needs a van.  Currently lifting him into seat, then pushing wheelchair the van by a ramp.**  Goals of care:  do remote learning for safety   Advance care planning: Full Code    Psychosocial:  Father is not involved with the patient. Mom has cousin that helps with [name] at times - [place], as well as their daughter [name].   Lives with mom and Attends [employer]  Diagnostics/Screenings:  EEG showed right central diphasic sharply contoured slow-wave activity.   MRI of the brain failed to show a structural abnormality.  Autism was diagnosed at age 22  Duchenne muscular  dystrophy was diagnosed at age 67.  Polysomnogram October 2018 at [facility] - obstructive sleep apnea  EKG 05/11/18 - normal sinus rhythm with normal PR, QRS and corrected QT interval  Echocardiogram 05/11/18 - normal biventricular size and function  11/28/19 Echo- ECG: normal rate and rhythm, hear rate 95/minute is actually a bit lower than last 2 ECG's, favorable.Echocardiogram: normal chamber size, wall thickness, squeezing.  05/29/2020: proximal third left humeral shaft fracture with varus angulation. Osteoporotic bone noted   05/24/2020 abdominal x-ray Large amount of stool at the level of the rectum   Rockwell Germany NP-C and Carylon Perches, MD Pediatric Complex Care Program Ph. [phone] Fax [phone]

## 2020-09-10 NOTE — Progress Notes (Addendum)
° °  Medical Nutrition Therapy - Progress Note Appt start time: 11:15 AM Appt end time: 11:30 AM Reason for referral: Gtube dependence  Referring provider: [name] - [facility] DME: [facility] Pertinent medical hx: Duchenne musclar dystrophy, neuromuscular scoliosis, autism, seizures, FTT, +Gtube  Assessment: Food allergies: none Pertinent Medications: see medication list Vitamins/Supplements: 2 oz prune juice 2x/day, Miralax Pertinent labs: no recent labs in Epic  (9/30) Anthropometrics: The child was weighed, measured, and plotted on the CDC growth chart. Ht: 149.3 cm (<1 %)  Z-score: -3.77 Wt: 41.1 kg (<1 %)  Z-score: -4.32 BMI: 18.4 (3 %)  Z-score: -1.85  (3/18) Anthropometrics: The child was weighed, measured, and plotted on the CDC growth chart. Ht: 149.3 cm (<0.01 %) Z-score: -3.73 Wt: 39.4 kg (<0.01 %)  Z-score: -4.70 BMI: 17.6 (1 %)  Z-score: -2.21  (7/3) Wt: 42.1 kg (3/20) Wt: 42.5 kg (2/17) Wt: 43.3 kg (11/21) Wt: 42.2 kg  Estimated minimum caloric needs: 28 kcal/kg/day (based on current regimen) Estimated minimum protein needs: 0.85 g/kg/day (DRI) Estimated minimum fluid needs: 46 mL/kg/day ([place] Segar)  Primary concerns today: Follow-up for Gtube dependence. Mom accompanied pt to appt today.  Dietary Intake Hx: Usual feeding regimen: Pediasure 1.0 + fiber - 5 cans daily with and additional 1000 mL free water  6-7 AM - 237 mL @ 237 mL/hr  9-11 AM - 237 mL @ 237 mL/hr  12-1 PM - 237 mL @ 237 mL/hr  3-4 PM - 237 mL @ 237 mL/hr  6-7 PM - 237 mL @ 237 mL/hr  FWF: 100 mL before and after each feed (1000 mL total) PO foods: refuses   GI: some constipation, addressed with [name] GU: no issues  Physical Activity: wheel-chair bound  Estimated caloric intake: 28 kcal/kg/day - meets 100% of estimated needs Estimated protein intake: 0.9 g/kg/day - meets 105% of estimated needs Estimated fluid intake: 48 mL/kg/day - meets 104% of estimated needs Micronutrient  intake: Vitamin A 700 mcg  Vitamin C 115 mg  Vitamin D 30 mcg  Vitamin E 15 mg  Vitamin K 90 mcg  Vitamin B1 (thiamin) 1.5 mg  Vitamin B2 (riboflavin) 1.7 mg  Vitamin B3 (niacin) 16 mg  Vitamin B5 (pantothenic acid) 6.5 mg  Vitamin B6 1.7 mg  Vitamin B7 (biotin) 40 mcg  Vitamin B9 (folate) 300 mcg  Vitamin B12 2.4 mcg  Choline 400 mg  Calcium 1650 mg  Chromium 45 mcg  Copper 700 mcg  Fluoride 0 mg  Iodine 115 mcg  Iron 13.5 mg  Magnesium 200 mg  Manganese 2.3 mg  Molybdenum 45 mcg  Phosphorous 1250 mg  Selenium 40 mcg  Zinc 8.5 mg  Potassium 2350 mg  Sodium 450 mg  Chloride 1150 mg  Fiber 15 g   Nutrition Diagnosis: (9/10) Inadequate oral intake related to medical status and dysphagia as evidence by pt dependent on Gtube to meet nutritional needs.  Intervention: Discussed current regimen and weight. Discussed future plan if pt continues gaining weight. All questions answered, mom in agreement with plan. Recommendations: - Continue current regimen. - We'll change his formula if his weight hits 95 lbs. - Call me if you have any questions or issues with feeds or formula.  Teach back method used.  Monitoring/Evaluation: Goals to Monitor: - Wt trends - TF tolerance - Need to switch to Pediasure Sidekicks  Follow-up in 6 months, joint with [name]  Total time spent in counseling: 15 minutes.

## 2020-09-10 NOTE — Patient Instructions (Addendum)
New orders:  Stop melatonin.  Call if he has trouble falling asleep or staying asleep.  Increase Orapred to 16ml daily  Appointments:  Follow-up with Dr [place] Cellar virtually.  Judson Roch will call you about CPAP Follow-up with Dr Joaquim Lai virtually Follow-up with Dr Edison Simon virtually to talk about new treatment for Duchenne Increase PT to once weekly and start daily exercise regimen  GI regimen:  Give Miralax 17g daily in a feeding bag Give 72ml of prune juice- with no water.  Give 192ml water flush Give feeding Give 174ml water flush Give ducosate $RemoveBefor'100mg'JxUaLhaEyXXw$  daily If he hasn't pooped in 2 days, give Lactulose

## 2020-09-10 NOTE — Patient Instructions (Addendum)
-   Continue current regimen. - We'll change his formula if his weight hits 95 lbs. - Call me if you have any questions or issues with feeds or formula.

## 2020-09-11 ENCOUNTER — Encounter (INDEPENDENT_AMBULATORY_CARE_PROVIDER_SITE_OTHER): Payer: Self-pay

## 2020-09-11 ENCOUNTER — Telehealth (INDEPENDENT_AMBULATORY_CARE_PROVIDER_SITE_OTHER): Payer: Self-pay

## 2020-09-11 DIAGNOSIS — H04123 Dry eye syndrome of bilateral lacrimal glands: Secondary | ICD-10-CM

## 2020-09-11 LAB — CBC WITH DIFFERENTIAL/PLATELET
Absolute Monocytes: 200 cells/uL (ref 200–950)
Basophils Absolute: 21 cells/uL (ref 0–200)
Basophils Relative: 0.3 %
Eosinophils Absolute: 0 cells/uL — ABNORMAL LOW (ref 15–500)
Eosinophils Relative: 0 %
HCT: 45.6 % (ref 38.5–50.0)
Hemoglobin: 15.7 g/dL (ref 13.2–17.1)
Lymphs Abs: 683 cells/uL — ABNORMAL LOW (ref 850–3900)
MCH: 30.4 pg (ref 27.0–33.0)
MCHC: 34.4 g/dL (ref 32.0–36.0)
MCV: 88.4 fL (ref 80.0–100.0)
MPV: 10.5 fL (ref 7.5–12.5)
Monocytes Relative: 2.9 %
Neutro Abs: 5996 cells/uL (ref 1500–7800)
Neutrophils Relative %: 86.9 %
Platelets: 187 10*3/uL (ref 140–400)
RBC: 5.16 10*6/uL (ref 4.20–5.80)
RDW: 13.3 % (ref 11.0–15.0)
Total Lymphocyte: 9.9 %
WBC: 6.9 10*3/uL (ref 3.8–10.8)

## 2020-09-11 LAB — IRON,TIBC AND FERRITIN PANEL
%SAT: 31 % (calc) (ref 16–48)
Ferritin: 16 ng/mL — ABNORMAL LOW (ref 38–380)
Iron: 129 ug/dL (ref 27–164)
TIBC: 420 mcg/dL (calc) (ref 271–448)

## 2020-09-11 LAB — VITAMIN D 25 HYDROXY (VIT D DEFICIENCY, FRACTURES): Vit D, 25-Hydroxy: 35 ng/mL (ref 30–100)

## 2020-09-11 MED ORDER — ARTIFICIAL TEARS OPHTHALMIC OINT: TOPICAL_OINTMENT | Freq: Every evening | OPHTHALMIC | 6 refills | Status: DC

## 2020-09-14 MED ORDER — ARTIFICIAL TEARS OPHTHALMIC OINT: TOPICAL_OINTMENT | Freq: Every evening | OPHTHALMIC | 6 refills | Status: DC

## 2020-09-14 NOTE — Telephone Encounter (Signed)
Mom called back and states that [facility] says they do not have the prescription for eye drops. Call back number is [phone].

## 2020-09-14 NOTE — Telephone Encounter (Signed)
Call to mom- left message with all the below information and advised will also send message through his my chart for her to be able to review.

## 2020-09-14 NOTE — Addendum Note (Signed)
Addended by: Blair Heys B on: 09/14/2020 04:09 PM   Modules accepted: Orders

## 2020-09-14 NOTE — Telephone Encounter (Addendum)
Call to [facility] Dr. Jena Gauss office- Spoke with Jamal Maes RN can see that Ailene Ravel RN tried to contact mom about x-ray report but phone number listed is incorrect- new number given. RN scheduled virtual visit for patient with Dr. Joaquim Lai on Wed Oct 6 at 3:30 pm watch for text with a link and click on the link to enter into the visit. RN advised will give information to mom.  Call to Dr. Brent Bulla office to schedule appt. Scheduled Jan 10 at 3 pm click on link which will be sent about 30 min before the appt.   RX will not escribe because it is OTC- requested pharmacy find appropriate eye lubrication that mom can use at night

## 2020-09-21 ENCOUNTER — Telehealth (INDEPENDENT_AMBULATORY_CARE_PROVIDER_SITE_OTHER): Payer: Self-pay | Admitting: Family

## 2020-09-21 DIAGNOSIS — G7101 Duchenne or Becker muscular dystrophy: Secondary | ICD-10-CM

## 2020-09-21 DIAGNOSIS — Z931 Gastrostomy status: Secondary | ICD-10-CM

## 2020-09-21 NOTE — Telephone Encounter (Signed)
Who's calling (name and relationship to patient) : Avanell Shackleton  Best contact number: [phone]  Provider they see: Rockwell Germany  Reason for call: Mom and Kennyth Lose spoke this morning. Mom informed Kennyth Lose that the formula feeding had been updated to pediasure side kicks. Kennyth Lose was requesting information that confirmed this change  Call ID:      PRESCRIPTION REFILL ONLY  Name of prescription:  Pharmacy:

## 2020-09-22 ENCOUNTER — Other Ambulatory Visit (INDEPENDENT_AMBULATORY_CARE_PROVIDER_SITE_OTHER): Payer: Self-pay

## 2020-09-22 MED ORDER — PEDIASURE SIDEKICKS PO LIQD: 1422.0000 mL | Freq: Every day | ORAL | 12 refills | Status: DC

## 2020-09-22 NOTE — Telephone Encounter (Signed)
New regimen messaged to mom (see MyChart message). Orders faxed via Epic by Judson Roch, RN.

## 2020-09-22 NOTE — Addendum Note (Signed)
Addended by: Jean Rosenthal on: 09/22/2020 01:15 PM   Modules accepted: Orders

## 2020-09-22 NOTE — Telephone Encounter (Signed)
Return call to Kennyth Lose- left message to adv mom just answered dietician yesterday that she is willing to try switching the formula. RN will fax the new order to her at [phone]. Also advised will send order to DME to dispense it.

## 2020-09-22 NOTE — Addendum Note (Signed)
Addended by: Blair Heys B on: 09/22/2020 09:18 AM   Modules accepted: Orders

## 2020-09-23 ENCOUNTER — Telehealth (INDEPENDENT_AMBULATORY_CARE_PROVIDER_SITE_OTHER): Payer: Self-pay | Admitting: Pediatrics

## 2020-09-23 ENCOUNTER — Encounter (INDEPENDENT_AMBULATORY_CARE_PROVIDER_SITE_OTHER): Payer: Self-pay | Admitting: Pediatrics

## 2020-09-23 MED ORDER — SIMETHICONE 40 MG/0.6ML PO SUSP (UNIT DOSE): 80.0000 mg | Freq: Four times a day (QID) | ORAL | 5 refills | Status: DC | PRN

## 2020-09-23 NOTE — Telephone Encounter (Signed)
I recommend starting with simethicone Elberta Leatherwood), this is an over the counter medication, I can't order it she will have to buy it at the store.  However, I have put in an order for the nurses through [facility] for mom.    Carylon Perches MD MPH

## 2020-09-23 NOTE — Telephone Encounter (Signed)
Lucas [name] called to f/u.  He is leaving [facility] for the day.  Please call.

## 2020-09-23 NOTE — Telephone Encounter (Signed)
°  Who's calling (name and relationship to patient) : Jake with [facility] contact number: On Wednesday, Jake's cell [phone], other days of the week, call the main office at [phone] and ask for Glenna Fellows  Provider they see: [name]  Reason for call: Patient is having trouble with a lot of gas and mom and home health are asking for something to be sent in to help relieve the gas.    PRESCRIPTION REFILL ONLY  Name of prescription:  Pharmacy:

## 2020-09-24 ENCOUNTER — Telehealth (INDEPENDENT_AMBULATORY_CARE_PROVIDER_SITE_OTHER): Payer: Self-pay | Admitting: Pediatrics

## 2020-09-24 ENCOUNTER — Other Ambulatory Visit (INDEPENDENT_AMBULATORY_CARE_PROVIDER_SITE_OTHER): Payer: Self-pay | Admitting: Family

## 2020-09-24 DIAGNOSIS — G7100 Muscular dystrophy, unspecified: Secondary | ICD-10-CM

## 2020-09-24 DIAGNOSIS — R143 Flatulence: Secondary | ICD-10-CM

## 2020-09-24 MED ORDER — ALBUTEROL SULFATE (2.5 MG/3ML) 0.083% IN NEBU: INHALATION_SOLUTION | RESPIRATORY_TRACT | 0 refills | Status: DC

## 2020-09-24 MED ORDER — SIMETHICONE 125 MG PO CAPS: ORAL_CAPSULE | ORAL | 0 refills | Status: DC

## 2020-09-24 NOTE — Telephone Encounter (Signed)
Who's calling (name and relationship to patient) : Athanasius Kesling mom   Best contact number: [phone]  Provider they see: [name]  Reason for call: Mom has gas relief and it is $Rem'125mg'mdZl$  but mom says Nichalas is only supposed to have 13. She isn't sure how to give the right dose because it is a gel.   Call ID:      PRESCRIPTION REFILL ONLY  Name of prescription:  Pharmacy:

## 2020-09-24 NOTE — Telephone Encounter (Signed)
Answered via telephone call from same date.

## 2020-09-24 NOTE — Telephone Encounter (Signed)
Someone purchased the gas-x for her and it is 125 mg per gel capsule- RN asked [name] and she advised to make whole in the capsule and squeeze the contents into his tube. 125 3x a day is fine. Mom also reports that the ortho doctor said his arms was not completely healed and wants him to take 500 mg of Ca but she is concerned because the side kicks has 330 mg of Ca in it. RN adv to give 1/2 the Ca tab until RN can verify with Dietician the amount he is to have. Mom reports it should be 600 mg but RN is not sure it that is in addition to the Ca in the formula.  Will need to send new order to [facility] when it is determined.

## 2020-09-25 ENCOUNTER — Encounter (INDEPENDENT_AMBULATORY_CARE_PROVIDER_SITE_OTHER): Payer: Self-pay

## 2020-09-25 NOTE — Telephone Encounter (Signed)
Advised mom Olivia Mackie per Dietician he is  Getting 6 bottles of Pediasure Sidekicks contains 1500 mg of calcium. Mom is welcome to call her ortho provider to see if this is enough or if he needs more calcium.  Mom reports he is only getting 5 cans he cannot do 6 it is too much. RN advised she contact orthopedic physician and adv of his formula because we can not dc an order we did not write. She states understanding. Mom also reports she needs an order to [facility] to give the gas x and to decrease his prednisone from 4 ml to 3 ml because he did not sleep last night. RN advised will ask MD and send. She said he was up laughing and wide awake. RN spoke with [name] she would like her to keep giving the 4 ml especially if it was just one night. When prednisone is given in the morning it should not affect his sleep that night.  Per [employer] "The elimination half life of prednisone is around 3 to 4 hours. This is the time it takes for your body to reduce the plasma levels by half. " His dose is low and should not cause side effects after 12 hrs of receiving it.

## 2020-09-29 MED ORDER — SIMETHICONE 125 MG PO CHEW: CHEWABLE_TABLET | ORAL | 0 refills | Status: DC

## 2020-09-29 NOTE — Telephone Encounter (Signed)
Call to [name]- she reports she gave him the 125 mg of the gas-x last night and he had a splotchy rash and loose stool. RN advised it is highly unlikely that it was related to the Gas-X but she is insistent and wants a new order sent to [facility] to give 1/4 of the 125 tab up to 1/2 tab as needed for gas. RN tried to explain the extra strength med still is given based by mg/kg dosing but mom was concerned it was too much. RN advised that an order for Gas-x and formula would be sent to [facility].  Mom agrees with plan and denies that he has any other signs of illness at this time acts fine.

## 2020-09-29 NOTE — Telephone Encounter (Signed)
Mom called stating that the needs a childrens dose of Gas x because when she gave him the dose she has Diondre has gotten very sick.   Please call mom back to discuss.

## 2020-09-29 NOTE — Addendum Note (Signed)
Addended by: Blair Heys B on: 09/29/2020 04:18 PM   Modules accepted: Orders

## 2020-10-09 ENCOUNTER — Telehealth (INDEPENDENT_AMBULATORY_CARE_PROVIDER_SITE_OTHER): Payer: Medicaid Other | Admitting: Family

## 2020-10-09 ENCOUNTER — Telehealth (INDEPENDENT_AMBULATORY_CARE_PROVIDER_SITE_OTHER): Payer: Self-pay | Admitting: Family

## 2020-10-09 DIAGNOSIS — G4733 Obstructive sleep apnea (adult) (pediatric): Secondary | ICD-10-CM

## 2020-10-09 DIAGNOSIS — F840 Autistic disorder: Secondary | ICD-10-CM

## 2020-10-09 DIAGNOSIS — M4145 Neuromuscular scoliosis, thoracolumbar region: Secondary | ICD-10-CM

## 2020-10-09 DIAGNOSIS — Z931 Gastrostomy status: Secondary | ICD-10-CM

## 2020-10-09 DIAGNOSIS — R21 Rash and other nonspecific skin eruption: Secondary | ICD-10-CM

## 2020-10-09 DIAGNOSIS — G40209 Localization-related (focal) (partial) symptomatic epilepsy and epileptic syndromes with complex partial seizures, not intractable, without status epilepticus: Secondary | ICD-10-CM | POA: Diagnosis not present

## 2020-10-09 DIAGNOSIS — J984 Other disorders of lung: Secondary | ICD-10-CM

## 2020-10-09 DIAGNOSIS — G7101 Duchenne or Becker muscular dystrophy: Secondary | ICD-10-CM

## 2020-10-09 DIAGNOSIS — G7100 Muscular dystrophy, unspecified: Secondary | ICD-10-CM

## 2020-10-09 MED ORDER — TRIAMCINOLONE ACETONIDE 0.1 % EX CREA: TOPICAL_CREAM | CUTANEOUS | 1 refills | Status: DC

## 2020-10-09 NOTE — Telephone Encounter (Signed)
°  Who's calling (name and relationship to patient) : [name] contact number: [phone]  Provider they see: Rockwell Germany  Reason for call: Olivia Mackie called patient has a large rash all over his back side and it has opened up she said. I asked what the nurse suggested and she aid she doesn't come in to later. Looking for advice and a possible prescription that could help Day. I also asked about the PCP and of course she was not hearing that she wanted me to ask Otila Kluver.     PRESCRIPTION REFILL ONLY  Name of prescription:  Pharmacy:

## 2020-10-09 NOTE — Progress Notes (Signed)
This is a Pediatric Specialist E-Visit follow up consult provided via Lucas [name] and his mother Lucas [name] consented to an E-Visit consult today.  Location of patient: Lucas [name] is at home Location of provider: Normand Sloop is at office Patient was referred by Lucas Late, MD   The following participants were involved in this E-Visit: NP, patient and his mother  Chief Complain/ Reason for E-Visit today: rash Total time on call: 10 min Follow up: as previously scheduled with Dr Terrilyn Saver   MRN:  [id number]  06/07/2001   Provider: Rockwell Germany NP-C Location of Care: [facility]  Visit type: telehealth  Last visit: 09/10/2020 with Dr Rogers Blocker  Referral source: Lucas Late, MD History from: Epic chart and patient's mother  Brief history:  Copied from previous record: history ofDuchenne Muscular Dystrophy, autism spectrum disorder, level 3, restrictive lung disease, compensated congestive heart failure, severe dysphagia requiring tube feeding,obstructive sleep apneaand a recent pathologic fracture of the proximal one third left humerus    Today's concerns: [name] is see today on urgent basis because his mother called to report a rash on his right posterior leg. The phone call was turned to telehealth so that I could view the rash. She estimates that the rash has been present for about a month. She has tried Desitin which she said helped initially but then the rash returned and now has open areas. Mom is very concerned about the rash and wants a treatment option.  Aneesh has been otherwise generally healthy since he was last seen. His mother has no other health concerns for him today other than previously mentioned.   Review of systems: Please see HPI for neurologic and other pertinent review of systems. Otherwise all other systems were reviewed and were negative.  Problem List: Patient Active Problem List    Diagnosis Date Noted  . Feeding by G-tube ([place]) 03/18/2019  . 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 Urinary retention 11/01/2018  . DNR (do not resuscitate) discussion 11/01/2018  . Obstructive sleep apnea syndrome 10/03/2017  . Failure to thrive (0-17) 05/24/2017  . 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 Constipation   . Post-ictal state ([place])   . Abdominal pain   . Tachycardia 06/09/2015  . Seizure ([place]) 06/08/2015  . Autism spectrum disorder with accompanying language impairment and intellectual disability, requiring very substantial support 06/17/2014  . Vomiting 06/08/2014  . Complex care coordination 04/30/2014  . Partial epilepsy with impairment of consciousness 04/30/2014  . Duchenne muscular dystrophy 04/30/2014     Past Medical History:  Diagnosis Date  . Autism   . Autism   . Diarrhea 06/09/2015  . Family history of adverse reaction to anesthesia    MGGM- N/V  . Muscular dystrophy ([place])   . Scoliosis   . Seizure ([place])   . Seizure ([place])   . Seizures ([place])    Last one 2013  . Transient alteration of awareness   . Viral gastroenteritis     Past medical history comments: See HPI  Surgical history: Past Surgical History:  Procedure Laterality Date  . CIRCUMCISION     at birth  . LAPAROSCOPIC GASTROSTOMY N/A 05/24/2017   Procedure: LAPAROSCOPIC GASTROSTOMY TUBE PLACEMENT;  Surgeon: Stanford Scotland, MD;  Location: [facility];  Service: General;  Laterality: N/A;  . SPINE SURGERY N/A    Phreesia 06/29/2020     Family history: family history includes Alcohol abuse in  his father; Asthma in an other family member; Cancer in his paternal grandfather; Dementia in his paternal grandmother; Heart failure in his maternal grandmother; Hyperlipidemia in his mother and another family member; Hypertension in his maternal grandmother and another family member; Learning disabilities in his mother; Other in his maternal aunt,  maternal grandmother, maternal uncle, and mother; Vision loss in an other family member.   Social history: Social History   Socioeconomic History  . Marital status: Single    Spouse name: Not on file  . Number of children: Not on file  . Years of education: Not on file  . Highest education level: Not on file  Occupational History  . Not on file  Tobacco Use  . Smoking status: Passive Smoke Exposure - Never Smoker  . Smokeless tobacco: Never Used  Vaping Use  . Vaping Use: Never used  Substance and Sexual Activity  . Alcohol use: No  . Drug use: No  . Sexual activity: Never  Other Topics Concern  . Not on file  Social History Narrative   Azell is a 12th grade student. His private duty nurse attends school with him.   He attends [employer].    He lives with mother.   Social Determinants of Health   Financial Resource Strain:   . Difficulty of Paying Living Expenses: Not on file  Food Insecurity:   . Worried About Charity fundraiser in the Last Year: Not on file  . Ran Out of Food in the Last Year: Not on file  Transportation Needs:   . Lack of Transportation (Medical): Not on file  . Lack of Transportation (Non-Medical): Not on file  Physical Activity:   . Days of Exercise per Week: Not on file  . Minutes of Exercise per Session: Not on file  Stress:   . Feeling of Stress : Not on file  Social Connections:   . Frequency of Communication with Friends and Family: Not on file  . Frequency of Social Gatherings with Friends and Family: Not on file  . Attends Religious Services: Not on file  . Active Member of Clubs or Organizations: Not on file  . Attends Archivist Meetings: Not on file  . Marital Status: Not on file  Intimate Partner Violence:   . Fear of Current or Ex-Partner: Not on file  . Emotionally Abused: Not on file  . Physically Abused: Not on file  . Sexually Abused: Not on file    Past/failed meds:  Allergies: Allergies   Allergen Reactions  . Penicillins Hives and Rash    Has patient had a PCN reaction causing immediate rash, facial/tongue/throat swelling, SOB or lightheadedness with hypotension: Yes Has patient had a PCN reaction causing severe rash involving mucus membranes or skin necrosis: Yes Has patient had a PCN reaction that required hospitalization: No Has patient had a PCN reaction occurring within the last 10 years: No If all of the above answers are "NO", then may proceed with Cephalosporin use.   . Calcitonin Rash  . Esomeprazole Magnesium Nausea And Vomiting  . Omeprazole Other (See Comments)    Constipation and possible rash after drug was stopped  . Vitamin D Analogs Other (See Comments)    Excessive urine output and thristy    Immunizations: Immunization History  Administered Date(s) Administered  . Influenza,inj,Quad PF,6+ Mos 09/10/2020  . Moderna SARS-COVID-2 Vaccination 03/24/2020, 03/24/2020, 04/20/2020    Diagnostics/Screenings:  Physical Exam: There were no vitals taken for this visit.  General: well developed, well nourished boy, lying in bed at home, in no evident distress Head: normocephalic and atraumatic.No dysmorphic features. Neck: supple Skin: no neurocutaneous lesions. He has a scattered red rash on the upper aspect of his right posterior leg. Some lesions have broken skin but there is no obvious drainage.  Neurologic Exam Mental Status: awake and fully alert. Has no language.  Smiles responsively.  Cranial Nerves: turns to localize faces and objects in the periphery. Turns to localize sounds in the periphery. Facial movements are asymmetric, has lower facial weakness with drooling. Motor: increased tone in the extremities with multiple contractures Sensory: withdrawal x 4 Coordination: unable to adequately assess due to patient's inability to participate in examination.  Gait and Station: unable to stand and bear weight.  Impression: 1. Rash on posterior  leg 2. Duchenne muscular dystrophy 3. Partial epilepsy with impairment of consciousness 4. Neuromuscular scoliosis 5. Obstructive sleep apnea 6. G-tube dependence  Recommendations for plan of care: The patient's previous [facility] records were reviewed. Kem has neither had nor required imaging or lab studies since the last visit. He is a 19 year old boy with history of Duchenne muscular dystrophy, epilepsy, neuromuscular scoliosis, obstructive sleep apnea and g-tube dependence. He is seen today on urgent basis because of rash on his posterior leg. I viewed the rash on the video and recommended treatment with Kenalog cream. I instructed Mom to expose the rash to air when possible and to position [name] off the rash when possible.   The medication list was reviewed and reconciled. I reviewed changes that were made in the prescribed medications today. A complete medication list was provided to the patient.  Allergies as of 10/09/2020      Reactions   Penicillins Hives, Rash   Has patient had a PCN reaction causing immediate rash, facial/tongue/throat swelling, SOB or lightheadedness with hypotension: Yes Has patient had a PCN reaction causing severe rash involving mucus membranes or skin necrosis: Yes Has patient had a PCN reaction that required hospitalization: No Has patient had a PCN reaction occurring within the last 10 years: No If all of the above answers are "NO", then may proceed with Cephalosporin use.   Calcitonin Rash   Esomeprazole Magnesium Nausea And Vomiting   Omeprazole Other (See Comments)   Constipation and possible rash after drug was stopped   Vitamin D Analogs Other (See Comments)   Excessive urine output and thristy      Medication List       Accurate as of October 09, 2020 11:59 PM. If you have any questions, ask your nurse or doctor.        acetaminophen 160 MG/5ML elixir Commonly known as: TYLENOL Take 15 mg/kg by mouth every 4 (four) hours as needed for  fever.   albuterol (2.5 MG/3ML) 0.083% nebulizer solution Commonly known as: PROVENTIL Give by nebulizer up to 3 times per day as needed for wheezing   artificial tears Oint ophthalmic ointment Commonly known as: LACRILUBE Place into both eyes at bedtime.   carbamazepine 100 MG chewable tablet Commonly known as: TEGRETOL TAKE ONE AND ONE-HALF TABLETS BY MOUTH TWICE A DAY   cromolyn 4 % ophthalmic solution Commonly known as: OPTICROM Place 1 drop into both eyes 4 (four) times daily as needed.   diazepam 10 MG Gel Commonly known as: DIASTAT ACUDIAL Give 10 mg rectally after 2 minutes of seizures.  May repeat in 10 minutes   diphenhydrAMINE 12.5 MG/5ML elixir Commonly known as: BENADRYL 5 -  10 ml po as needed for hives 3 times a day   Docu 150 MG/15ML syrup Generic drug: Docusate Sodium Take 10 mLs (100 mg total) by mouth daily.   HYDROcodone-acetaminophen 7.5-325 mg/15 ml solution Commonly known as: HYCET Take 15 mLs by mouth 4 (four) times daily as needed for moderate pain.   lactulose 10 GM/15ML solution Commonly known as: CHRONULAC Place 30 mLs (20 g total) into feeding tube daily as needed (no stool for 2 days).   PediaSure SideKicks Liqd Place 1,422 mLs into feeding tube daily. Provide 1 bottle (237 mL) of Pediasure Sidekicks @ 237 mL/hr x 6 feeds @ 6 AM, 9 AM, 12 PM, 3 PM, 6 PM, and 9 PM. Flush with 100 mL water before and after each feed.   PEG 3350 17 GM/SCOOP Powd Give 17 g by tube daily.   prednisoLONE 15 MG/5ML solution Commonly known as: ORAPRED Place 4 mLs (12 mg total) into feeding tube daily before breakfast.   senna 8.6 MG Tabs tablet Commonly known as: SENOKOT 1 tablet (8.6 mg total) by Per NG tube route at bedtime.   simethicone 125 MG chewable tablet Commonly known as: Gas-X Extra Strength Give 1/4 to 1/2 tab dissolved in water through G tube every 6 hrs as needed for increased gas   triamcinolone cream 0.1 % Commonly known as: KENALOG Apply  thin layer to rash on leg twice per day Started by: Rockwell Germany, NP      Total time spent with the patient was 10 minutes, of which 50% or more was spent in counseling and coordination of care.  Rockwell Germany NP-C [facility] Ph. [phone] Fax [phone]

## 2020-10-09 NOTE — Telephone Encounter (Signed)
Mom called back. The visit was switched to video visit so that I could view the rash. TG

## 2020-10-09 NOTE — Telephone Encounter (Signed)
I left a message for Mom and let her know that I will call back. TG

## 2020-10-10 ENCOUNTER — Encounter (INDEPENDENT_AMBULATORY_CARE_PROVIDER_SITE_OTHER): Payer: Self-pay | Admitting: Family

## 2020-10-10 NOTE — Patient Instructions (Signed)
Thank you for meeting with me by video today.   I sent in a prescription for Kenalog cream for Lucas [name]'s rash. Apply a thin layer twice per day Try to position Lucas [name] off the rash when possible and try to expose the rash to air when you can Let me know in a week how Lucas [name] is doing

## 2020-10-14 ENCOUNTER — Telehealth (INDEPENDENT_AMBULATORY_CARE_PROVIDER_SITE_OTHER): Payer: Self-pay | Admitting: Family

## 2020-10-14 DIAGNOSIS — G7100 Muscular dystrophy, unspecified: Secondary | ICD-10-CM

## 2020-10-14 DIAGNOSIS — G4733 Obstructive sleep apnea (adult) (pediatric): Secondary | ICD-10-CM

## 2020-10-14 DIAGNOSIS — G7101 Duchenne or Becker muscular dystrophy: Secondary | ICD-10-CM

## 2020-10-14 DIAGNOSIS — F840 Autistic disorder: Secondary | ICD-10-CM

## 2020-10-14 DIAGNOSIS — G40209 Localization-related (focal) (partial) symptomatic epilepsy and epileptic syndromes with complex partial seizures, not intractable, without status epilepticus: Secondary | ICD-10-CM

## 2020-10-14 DIAGNOSIS — M4145 Neuromuscular scoliosis, thoracolumbar region: Secondary | ICD-10-CM

## 2020-10-14 NOTE — Telephone Encounter (Signed)
I was contacted by Lucas [name]'s care manager who requested a new pulse oximeter machine for him as his current machine is old and not functioning properly. I ordered a new machine online with [facility] and received a request today for an updated order to be faxed to them at [phone]. I faxed the order as requested. TG

## 2020-10-16 ENCOUNTER — Telehealth (INDEPENDENT_AMBULATORY_CARE_PROVIDER_SITE_OTHER): Payer: Self-pay | Admitting: Family

## 2020-10-16 ENCOUNTER — Encounter (INDEPENDENT_AMBULATORY_CARE_PROVIDER_SITE_OTHER): Payer: Self-pay

## 2020-10-16 NOTE — Telephone Encounter (Signed)
I called Mom. She said that the rash was clearing well, but then Desitin was put on the rash and it got red and irritated again. In talking with Mom, rash area is frequently wet with urine or stool and needs frequent cleaning. I explained to Mom that the Kenalog cream should only be used twice per day. If Desitin is used, it should be applied and not washed off but gently cleaned and patted dry with diaper changes. Mom is adamant that Desitin worsened the rash and that the skin must be thoroughly cleaned with diaper changes. I recommended trying a medicated zinc powder such as Caldescene powder for diaper changes and again stressed that the rash area should be cleaned very gently with water and patted dry. I sent her a MyChart message with the name of the powder and recommended instructions. TG

## 2020-10-16 NOTE — Telephone Encounter (Signed)
Who's calling (name and relationship to patient) : Lucas [name] mom   Best contact number: [phone]  Provider they see: Rockwell Germany  Reason for call: Mom had been using the cream to help with the rash [name] had. Mom states that she needed to apply the cream much more frequently than instructed to get the rash to go away. It has come back. Mom would like a medication that can be used jointly with cream to help.   Please call back to discuss.   Call ID:      PRESCRIPTION REFILL ONLY  Name of prescription:  Pharmacy:

## 2020-10-20 ENCOUNTER — Telehealth (INDEPENDENT_AMBULATORY_CARE_PROVIDER_SITE_OTHER): Payer: Self-pay | Admitting: Family

## 2020-10-20 NOTE — Telephone Encounter (Signed)
Mom called back. She said that she thinks the rash on Lucas [name]'s leg is yeast from diaper moisture. I explained that I need to see the rash since it has lasted so long. Mom cannot get transportation untll Wednesday November 17th. I scheduled an appointment for him that day and instructed Mom to use the powder discussed in MyChart messages to keep the skin clean and dry until then. Mom agreed with this plan. TG

## 2020-10-20 NOTE — Telephone Encounter (Signed)
Mom called back stating that she had a missed call from office

## 2020-10-20 NOTE — Telephone Encounter (Signed)
  Who's calling (name and relationship to patient) :mom / Holley Bouche  Best contact number:[phone]  Provider they PFX:TKWI [facility]   Reason for call:Mom called stating that she has concerns about a rash that Lucas [name] has and would like to speak with someone. Please advise      PRESCRIPTION REFILL ONLY  Name of prescription:  Pharmacy:

## 2020-10-20 NOTE — Telephone Encounter (Signed)
I spoke with Mom and arranged a home visit for tomorrow morning at 8:15 to view the rash. TG

## 2020-10-20 NOTE — Telephone Encounter (Signed)
I called and left a message for Mom. I will call her back later today. TG

## 2020-10-21 ENCOUNTER — Other Ambulatory Visit: Payer: Medicaid Other | Admitting: Family

## 2020-10-21 DIAGNOSIS — B372 Candidiasis of skin and nail: Secondary | ICD-10-CM

## 2020-10-21 DIAGNOSIS — G4733 Obstructive sleep apnea (adult) (pediatric): Secondary | ICD-10-CM

## 2020-10-21 DIAGNOSIS — G40209 Localization-related (focal) (partial) symptomatic epilepsy and epileptic syndromes with complex partial seizures, not intractable, without status epilepticus: Secondary | ICD-10-CM | POA: Diagnosis not present

## 2020-10-21 DIAGNOSIS — G7100 Muscular dystrophy, unspecified: Secondary | ICD-10-CM

## 2020-10-21 DIAGNOSIS — J984 Other disorders of lung: Secondary | ICD-10-CM | POA: Diagnosis not present

## 2020-10-21 DIAGNOSIS — G7101 Duchenne or Becker muscular dystrophy: Secondary | ICD-10-CM

## 2020-10-21 DIAGNOSIS — M4145 Neuromuscular scoliosis, thoracolumbar region: Secondary | ICD-10-CM

## 2020-10-21 DIAGNOSIS — R569 Unspecified convulsions: Secondary | ICD-10-CM

## 2020-10-21 DIAGNOSIS — Z931 Gastrostomy status: Secondary | ICD-10-CM

## 2020-10-21 DIAGNOSIS — F840 Autistic disorder: Secondary | ICD-10-CM

## 2020-10-21 MED ORDER — NYSTATIN 100000 UNIT/GM EX POWD: CUTANEOUS | 1 refills | Status: DC

## 2020-10-21 MED ORDER — NYSTATIN 100000 UNIT/GM EX CREA: TOPICAL_CREAM | CUTANEOUS | 1 refills | Status: DC

## 2020-10-24 ENCOUNTER — Encounter (INDEPENDENT_AMBULATORY_CARE_PROVIDER_SITE_OTHER): Payer: Self-pay | Admitting: Family

## 2020-10-24 DIAGNOSIS — B372 Candidiasis of skin and nail: Secondary | ICD-10-CM

## 2020-10-24 HISTORY — DX: Candidiasis of skin and nail: B37.2

## 2020-10-24 NOTE — Patient Instructions (Signed)
Thank you for allowing me to see Lucas [name] in your home today.   I sent in a prescription for Nystatin cream. Apply a thin layer to the rash twice per day I also sent in a prescription for Nystatin powder. Use this after diaper changes  Try to keep [name] turned and off the rash so that it can heal  Let me know in a week if the rash has not improved

## 2020-10-24 NOTE — Progress Notes (Signed)
Lucas [name]   MRN:  [id number]  May 01, 2001   Provider: Rockwell Germany NP-C Location of Care:  [facility]  Visit type: Home visit  Last visit: 10/09/2020  Referral source: [name], MD History from: patient's mother and Epic chart  Brief history:  Copied from previous record: History ofDuchenne Muscular Dystrophy, autism spectrum disorder, level 3, restrictive lung disease, compensated congestive heart failure, severe dysphagia requiring tube feeding,obstructive sleep apneaand a recent pathologic fracture of the proximal one third left humerus  Today's concerns: Lucas [name] is seen today on urgent basis because his mother called me with concerns about worsening rash on his posterior legs and buttocks. She said that the Kenalog cream gave improvement for few days but that then the rash returned and spread. She has been working to keep the area dry but it is difficult with diaper changes.   [name] has been otherwise generally healthy since he was last seen. Mom has no other health concerns for him today other than previously mentioned.  Review of systems: Please see HPI for neurologic and other pertinent review of systems. Otherwise all other systems were reviewed and were negative.  Problem List: Patient Active Problem List   Diagnosis Date Noted  . Feeding by G-tube ([place]) 03/18/2019  . 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 Urinary retention 11/01/2018  . DNR (do not resuscitate) discussion 11/01/2018  . Obstructive sleep apnea syndrome 10/03/2017  . Failure to thrive (0-17) 05/24/2017  . 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 Constipation   . Post-ictal state ([place])   . Abdominal pain   . Tachycardia 06/09/2015  . Seizure ([place]) 06/08/2015  . Autism spectrum disorder with accompanying language impairment and intellectual disability, requiring very substantial support 06/17/2014  .  Vomiting 06/08/2014  . Complex care coordination 04/30/2014  . Partial epilepsy with impairment of consciousness 04/30/2014  . Duchenne muscular dystrophy 04/30/2014     Past Medical History:  Diagnosis Date  . Autism   . Autism   . Diarrhea 06/09/2015  . Family history of adverse reaction to anesthesia    MGGM- N/V  . Muscular dystrophy ([place])   . Scoliosis   . Seizure ([place])   . Seizure ([place])   . Seizures ([place])    Last one 2013  . Transient alteration of awareness   . Viral gastroenteritis     Past medical history comments: See HPI His last seizure occurred in late April or May 2013, and was an episode of status epilepticus. EEG showed right central diphasic sharply contoured slow-wave activity. MRI of the brain failed to show a structural abnormality. He has been seizure-free since that time. Autism was diagnosed at age 58, diagnosis of Duchenne muscular dystrophy was made at age 68. He is wheelchair bound. He is unable to communicate.  Hospitalized due to constipation 06/08/14 until 06/11/14.  He had a split night polysomnogram on September 17, 2017, which showed significant sleep apnea with 2 episodes of obstructive apnea, 1 central apnea, and 8 hypopneas. Most of his night was spent awake. He also had light natural sleep and deep sleep but no rapid eye movement sleep. He showed significant improvement in his episodes of apnea with CPAP pressures of 4, 6, and 8. He did not sleep on the lower doses but did on the higher dose. He was tested with a large mask and a small one and tolerated the large mask better.  Birth History 6 bs. 13 oz.  Infant born at full-term to a 15 year old primigravida.  Mother gained more than 25 pounds and took medications other than vitamins and iron.  Labor lasted for 12 hours.  Normal spontaneous vaginal delivery.  The child may have had an infection in the nursery. Details are uncertain.  Growth and development was delayed for gross motor  skills including pulling to stand and walking alone. He was also significantly delayed for his language.  Surgical history: Past Surgical History:  Procedure Laterality Date  . CIRCUMCISION     at birth  . LAPAROSCOPIC GASTROSTOMY N/A 05/24/2017   Procedure: LAPAROSCOPIC GASTROSTOMY TUBE PLACEMENT;  Surgeon: Stanford Scotland, MD;  Location: [facility];  Service: General;  Laterality: N/A;  . SPINE SURGERY N/A    Phreesia 06/29/2020     Family history: family history includes Alcohol abuse in his father; Asthma in an other family member; Cancer in his paternal grandfather; Dementia in his paternal grandmother; Heart failure in his maternal grandmother; Hyperlipidemia in his mother and another family member; Hypertension in his maternal grandmother and another family member; Learning disabilities in his mother; Other in his maternal aunt, maternal grandmother, maternal uncle, and mother; Vision loss in an other family member.   Social history: Social History   Socioeconomic History  . Marital status: Single    Spouse name: Not on file  . Number of children: Not on file  . Years of education: Not on file  . Highest education level: Not on file  Occupational History  . Not on file  Tobacco Use  . Smoking status: Passive Smoke Exposure - Never Smoker  . Smokeless tobacco: Never Used  Vaping Use  . Vaping Use: Never used  Substance and Sexual Activity  . Alcohol use: No  . Drug use: No  . Sexual activity: Never  Other Topics Concern  . Not on file  Social History Narrative   Lucas [name] is a 12th grade student. His private duty nurse attends school with him.   He attends [employer].    He lives with mother.   Social Determinants of Health   Financial Resource Strain:   . Difficulty of Paying Living Expenses: Not on file  Food Insecurity:   . Worried About Charity fundraiser in the Last Year: Not on file  . Ran Out of Food in the Last Year: Not on file  Transportation  Needs:   . Lack of Transportation (Medical): Not on file  . Lack of Transportation (Non-Medical): Not on file  Physical Activity:   . Days of Exercise per Week: Not on file  . Minutes of Exercise per Session: Not on file  Stress:   . Feeling of Stress : Not on file  Social Connections:   . Frequency of Communication with Friends and Family: Not on file  . Frequency of Social Gatherings with Friends and Family: Not on file  . Attends Religious Services: Not on file  . Active Member of Clubs or Organizations: Not on file  . Attends Archivist Meetings: Not on file  . Marital Status: Not on file  Intimate Partner Violence:   . Fear of Current or Ex-Partner: Not on file  . Emotionally Abused: Not on file  . Physically Abused: Not on file  . Sexually Abused: Not on file    Past/failed meds:  Allergies: Allergies  Allergen Reactions  . Penicillins Hives and Rash    Has patient had a PCN reaction causing immediate rash, facial/tongue/throat swelling,  SOB or lightheadedness with hypotension: Yes Has patient had a PCN reaction causing severe rash involving mucus membranes or skin necrosis: Yes Has patient had a PCN reaction that required hospitalization: No Has patient had a PCN reaction occurring within the last 10 years: No If all of the above answers are "NO", then may proceed with Cephalosporin use.   . Calcitonin Rash  . Esomeprazole Magnesium Nausea And Vomiting  . Omeprazole Other (See Comments)    Constipation and possible rash after drug was stopped  . Vitamin D Analogs Other (See Comments)    Excessive urine output and thristy    Immunizations: Immunization History  Administered Date(s) Administered  . Influenza,inj,Quad PF,6+ Mos 09/10/2020  . Moderna SARS-COVID-2 Vaccination 03/24/2020, 03/24/2020, 04/20/2020    Diagnostics/Screenings:  Physical Exam: There were no vitals taken for this visit.  General: well developed, well nourished boy, lying in bed in  no evident distress Head: normocephalic and atraumatic.No dysmorphic features. Neck: supple Cardiovascular: regular rate and rhythm, no murmurs. Respiratory: clear to auscultation bilaterally Abdomen: bowel sounds present all four quadrants, abdomen soft, non-tender, non-distended. No hepatosplenomegaly or masses palpated.Gastrostomy tube in place size 11F 2.3cm Musculoskeletal: no skeletal deformities. Has neuromuscular scoliosis. Has contractures in the ankles with equinus deformity, decreased range of motion at the hips, knees and elbows.  Skin: has red raised rash on posterior upper legs and buttocks  Neurologic Exam Mental Status: awake and fully alert. Has no language.  Smiles responsively. Unable to follow commands. Tolerant of invasions into his space. Cranial Nerves: turns to localize faces and objects in the periphery. Turns to localize sounds in the periphery. Facial movements are asymmetric, has lower facial weakness with drooling.  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 Does not reach for objects. Gait and Station: unable to stand and bear weight.  Impression: 1. Fungal rash on poster upper legs and buttocks 2. Duchenne muscular dystrophy 3. Partial epilepsy with impairment of consciousness 4. Neuromuscular scolosis 5. Obstructive sleep apnea 6. G-tube dependence  Recommendations for plan of care: The patient's previous [facility] records were reviewed. Lucas [name] has neither had nor required imaging or lab studies since the last visit. He is a 20 year old boy with history of Duchenne muscular dystrophy, epilepsy, neuromuscular scoliosis, obstructive sleep apnea and g-tube dependence. He is seen today at home because of his fragile medical condition and lack of transportation to appointments. He has a fungal rash on his posterior  upper legs and buttocks. This was evaluated last week by video visit but the video was blurry and it was not clear that the rash was a fungal rash. I instructed Mom to stop the Kenalog cream and to start Nystatin cream and powder. I talked with Mom and Ji's nurse today about trying to keep him clean and dry, and off the rash so that it can heal. I asked Mom to let me know next week if the rash has not improved. Lucas [name] will otherwise return for follow up in April 2022 as planned or sooner if needed. Mom agreed with the plans made today.   The medication list was reviewed and reconciled. I reviewed changes that were made in the prescribed medications today. A complete medication list was provided to the patient.  Allergies as of 10/21/2020      Reactions   Penicillins Hives, Rash   Has patient had a PCN reaction causing  immediate rash, facial/tongue/throat swelling, SOB or lightheadedness with hypotension: Yes Has patient had a PCN reaction causing severe rash involving mucus membranes or skin necrosis: Yes Has patient had a PCN reaction that required hospitalization: No Has patient had a PCN reaction occurring within the last 10 years: No If all of the above answers are "NO", then may proceed with Cephalosporin use.   Calcitonin Rash   Esomeprazole Magnesium Nausea And Vomiting   Omeprazole Other (See Comments)   Constipation and possible rash after drug was stopped   Vitamin D Analogs Other (See Comments)   Excessive urine output and thristy      Medication List       Accurate as of October 21, 2020 11:59 PM. If you have any questions, ask your nurse or doctor.        acetaminophen 160 MG/5ML elixir Commonly known as: TYLENOL Take 15 mg/kg by mouth every 4 (four) hours as needed for fever.   albuterol (2.5 MG/3ML) 0.083% nebulizer solution Commonly known as: PROVENTIL Give by nebulizer up to 3 times per day as needed for wheezing   artificial tears Oint ophthalmic  ointment Commonly known as: LACRILUBE Place into both eyes at bedtime.   carbamazepine 100 MG chewable tablet Commonly known as: TEGRETOL TAKE ONE AND ONE-HALF TABLETS BY MOUTH TWICE A DAY   cromolyn 4 % ophthalmic solution Commonly known as: OPTICROM Place 1 drop into both eyes 4 (four) times daily as needed.   diazepam 10 MG Gel Commonly known as: DIASTAT ACUDIAL Give 10 mg rectally after 2 minutes of seizures.  May repeat in 10 minutes   diphenhydrAMINE 12.5 MG/5ML elixir Commonly known as: BENADRYL 5 -10 ml po as needed for hives 3 times a day   Docu 150 MG/15ML syrup Generic drug: Docusate Sodium Take 10 mLs (100 mg total) by mouth daily.   HYDROcodone-acetaminophen 7.5-325 mg/15 ml solution Commonly known as: HYCET Take 15 mLs by mouth 4 (four) times daily as needed for moderate pain.   lactulose 10 GM/15ML solution Commonly known as: CHRONULAC Place 30 mLs (20 g total) into feeding tube daily as needed (no stool for 2 days).   nystatin cream Commonly known as: MYCOSTATIN Apply thin layer to rash 3 times per day for 10 days   nystatin powder Commonly known as: MYCOSTATIN/NYSTOP Apply lightly to rash 3 times per day for 10 days   PediaSure SideKicks Liqd Place 1,422 mLs into feeding tube daily. Provide 1 bottle (237 mL) of Pediasure Sidekicks @ 237 mL/hr x 6 feeds @ 6 AM, 9 AM, 12 PM, 3 PM, 6 PM, and 9 PM. Flush with 100 mL water before and after each feed.   PEG 3350 17 GM/SCOOP Powd Give 17 g by tube daily.   prednisoLONE 15 MG/5ML solution Commonly known as: ORAPRED Place 4 mLs (12 mg total) into feeding tube daily before breakfast.   senna 8.6 MG Tabs tablet Commonly known as: SENOKOT 1 tablet (8.6 mg total) by Per NG tube route at bedtime.   simethicone 125 MG chewable tablet Commonly known as: Gas-X Extra Strength Give 1/4 to 1/2 tab dissolved in water through G tube every 6 hrs as needed for increased gas   triamcinolone cream 0.1 % Commonly  known as: KENALOG Apply thin layer to rash on leg twice per day       Total time spent with the patient was 30 minutes, of which 50% or more was spent in counseling and coordination of care.  Otila Kluver  Akaila Rambo NP-C [facility] Ph. [phone] Fax [phone]

## 2020-10-28 ENCOUNTER — Ambulatory Visit (INDEPENDENT_AMBULATORY_CARE_PROVIDER_SITE_OTHER): Payer: Self-pay | Admitting: Family

## 2020-10-30 ENCOUNTER — Other Ambulatory Visit: Payer: Self-pay

## 2020-10-30 ENCOUNTER — Telehealth (INDEPENDENT_AMBULATORY_CARE_PROVIDER_SITE_OTHER): Payer: Medicaid Other | Admitting: Pediatrics

## 2020-10-30 ENCOUNTER — Encounter (INDEPENDENT_AMBULATORY_CARE_PROVIDER_SITE_OTHER): Payer: Self-pay | Admitting: Pediatrics

## 2020-10-30 VITALS — Wt 95.0 lb

## 2020-10-30 DIAGNOSIS — G4733 Obstructive sleep apnea (adult) (pediatric): Secondary | ICD-10-CM | POA: Diagnosis not present

## 2020-10-30 DIAGNOSIS — M4145 Neuromuscular scoliosis, thoracolumbar region: Secondary | ICD-10-CM

## 2020-10-30 DIAGNOSIS — G7100 Muscular dystrophy, unspecified: Secondary | ICD-10-CM

## 2020-10-30 DIAGNOSIS — G7101 Duchenne or Becker muscular dystrophy: Secondary | ICD-10-CM

## 2020-10-30 DIAGNOSIS — J984 Other disorders of lung: Secondary | ICD-10-CM | POA: Diagnosis not present

## 2020-10-30 NOTE — Progress Notes (Signed)
  This is a Pediatric Specialist E-Visit follow up consult provided via  [place], Wagner and their parent/guardian Elba Dendinger mother consented to an E-Visit consult today.  Location of patient: [place] is at their home in [place], [place] Location of provider: Pat Patrick, MD in [place] office Patient was referred by [name], MD   The following participants were involved in this E-Visit: Mother Marilynn Rail, MD, Blair Heys RN and patient Chief Complain/ Reason for E-Visit today: Review CPAP Total time on call: 15 Follow up: 15

## 2020-10-30 NOTE — Progress Notes (Signed)
Pediatric Pulmonology  Clinic Note  10/30/2020  Primary Care Physician: Lucas Late, MD  Reason For Visit: Pulmonary care for Duchenne Muscular Dystrophy  Assessment and Plan:  Lucas [name] is a 19 y.o. 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 Overall, he has done very well from a respiratory standpoint, and has done well recently. He uses cough assist and vest daily due to impaired cough and mucus clearance, which I recommend continuing. He had a bicarb last July of 27 that is reassuring for adequate ventilation.   Plan: - Continue cough assist and vest BID, increase to 3-4 x when sick - Albuterol prn   Obstructive sleep apnea: Lucas [name] has obstructive sleep apnea related to his muscular dystrophy, and has been using CPAP for ~ 3 years now. Seems to be fairly well controlled, but he is due for another study for titration - so will plan on doing that in ~ 6 months.  Plan: - Continue CPAP +11 at night  - Repeat sleep study in 6 months - order placed today  Healthcare Maintenance: Lucas [name] has received a flu vaccine this season.  Has received 2 COVID vaccines. Recommended that he receive a booster in the near future since he is past 6 months from his prior vaccine.   Followup: Return in about 7 months (around 05/30/2021).     Lucas Saxon "Will" [place]lands Cellar, MD [facility] Pediatric Pulmonology [place] Office: [place] [phone]   Subjective:  Lucas [name] is a 19 y.o. male who is seen for followup of pulmonary manifestations of duchenne's muscular dystrophy.     Lucas [name] was last seen by myself in clinic via telehealth in 10/2019. At that time, he was doing well, and was continued on vest, cough assist, and CPAP at night. We planned on repeating a sleep study in ~12 months.   Lucas [name] was seen by [name] in  September - with no significant issues raised at that time. He has also been seen in the ED recently for non-respiratory issues.   Today, Lucas [name] reports that Lucas [name] has been doing well.  No big changes recently. He has been doing home school, which is going well.  He is continuing to use his CPAP at night, which seems to be working well. He wears it at night - and though doesn't like it, he does use it.  He did have a cough about 2 weeks ago - which resolved pretty quickly. He is using cough assist and vest twice a day - which seems to work well. They have not used any albuterol recently.   He did get his covid vaccines - has not gotten a booster. He has gotten a flu vaccine this year.   They would be ok to repeat a sleep study in ~6 months. They would prefer to do the sleep study in [place].     Past Medical History:   Patient Active Problem List   Diagnosis Date Noted  . Feeding by G-tube ([place]) 03/18/2019  . 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 Urinary retention 11/01/2018  . Obstructive sleep apnea syndrome 10/03/2017  . Failure to thrive (0-17) 05/24/2017  . 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 Constipation   . Seizure ([place]) 06/08/2015  . Autism spectrum disorder with accompanying language impairment and intellectual disability, requiring very substantial support 06/17/2014  . Complex care coordination  04/30/2014  . Partial epilepsy with impairment of consciousness 04/30/2014  . Duchenne muscular dystrophy 04/30/2014    Medications:   Current Outpatient Medications:  .  carbamazepine (TEGRETOL) 100 MG chewable tablet, TAKE ONE AND ONE-HALF TABLETS BY MOUTH TWICE A DAY, Disp: 93 tablet, Rfl: 5 .  DOCU 50 MG/5ML syrup, Take 10 mLs (100 mg total) by mouth daily., Disp: 473 mL, Rfl: 5 .  Nutritional Supplements (PEDIASURE SIDEKICKS) LIQD, Place 1,422 mLs into feeding tube daily. Provide 1 bottle (237 mL) of  Pediasure Sidekicks @ 237 mL/hr x 6 feeds @ 6 AM, 9 AM, 12 PM, 3 PM, 6 PM, and 9 PM. Flush with 100 mL water before and after each feed., Disp: 44082 mL, Rfl: 12 .  Polyethylene Glycol 3350 (PEG 3350) 17 GM/SCOOP POWD, Give 17 g by tube daily., Disp: 578 g, Rfl: 5 .  senna (SENOKOT) 8.6 MG TABS tablet, 1 tablet (8.6 mg total) by Per NG tube route at bedtime., Disp: 120 tablet, Rfl: 1 .  acetaminophen (TYLENOL) 160 MG/5ML elixir, Take 15 mg/kg by mouth every 4 (four) hours as needed for fever. (Patient not taking: Reported on 10/30/2020), Disp: , Rfl:  .  albuterol (PROVENTIL) (2.5 MG/3ML) 0.083% nebulizer solution, Give by nebulizer up to 3 times per day as needed for wheezing (Patient not taking: Reported on 10/30/2020), Disp: 90 mL, Rfl: 0 .  artificial tears (LACRILUBE) OINT ophthalmic ointment, Place into both eyes at bedtime. (Patient not taking: Reported on 10/30/2020), Disp: 1 g, Rfl: 6 .  cromolyn (OPTICROM) 4 % ophthalmic solution, Place 1 drop into both eyes 4 (four) times daily as needed. (Patient not taking: Reported on 09/10/2020), Disp: 10 mL, Rfl: 3 .  diazepam (DIASTAT ACUDIAL) 10 MG GEL, Give 10 mg rectally after 2 minutes of seizures.  May repeat in 10 minutes (Patient not taking: Reported on 10/30/2020), Disp: 2 each, Rfl: 5 .  diphenhydrAMINE (BENADRYL) 12.5 MG/5ML elixir, 5 -10 ml po as needed for hives 3 times a day (Patient not taking: Reported on 10/30/2020), Disp: , Rfl:  .  lactulose (CHRONULAC) 10 GM/15ML solution, Place 30 mLs (20 g total) into feeding tube daily as needed (no stool for 2 days). (Patient not taking: Reported on 10/30/2020), Disp: 236 mL, Rfl: 3 .  nystatin (MYCOSTATIN/NYSTOP) powder, Apply lightly to rash 3 times per day for 10 days (Patient not taking: Reported on 10/30/2020), Disp: 30 g, Rfl: 1 .  nystatin cream (MYCOSTATIN), Apply thin layer to rash 3 times per day for 10 days (Patient not taking: Reported on 10/30/2020), Disp: 30 g, Rfl: 1 .  prednisoLONE  (ORAPRED) 15 MG/5ML solution, Place 4 mLs (12 mg total) into feeding tube daily before breakfast. (Patient not taking: Reported on 10/30/2020), Disp: 120 mL, Rfl: 5 .  simethicone (GAS-X EXTRA STRENGTH) 125 MG chewable tablet, Give 1/4 to 1/2 tab dissolved in water through G tube every 6 hrs as needed for increased gas (Patient not taking: Reported on 10/30/2020), Disp: 30 tablet, Rfl: 0 .  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 His private duty nurse attends school with him.   He attends [employer].    He lives with [name].     Lives with [name] and Tad in [place] [phone].  [name] smokes.   Objective:  Vitals Signs: Wt 95 lb (43.1 kg)   BMI 19.33 kg/m  Wt Readings from Last 3 Encounters:  10/30/20 95 lb (43.1 kg) (<1 %, Z= -3.85)*  09/10/20 90 lb 9.6 oz (41.1 kg) (<1 %, Z= -4.32)*  06/24/20 85 lb (38.6 kg) (<1 %, Z= -4.99)*   * Growth percentiles are based on CDC (Boys, 2-20 Years) data.   General: awake, no apparent distress by video observation Skin: what is visible appears to be clear and smooth without rash or excessive dryness HEENT: appears to be normocephalic. No deformity of ear or nose seen. Nares appear clear without rhinorrhea. Neck: symmetric without obvious masses Chest: symmetrical, no increased work of breathing or retractions Lungs: no audible stridor.   Medical Decision Making:   Recent Blood Gases/Bicarbonates:  pH, Ven  Date Value Ref Range Status  06/09/2015 7.397 (H) 7.25 - 7.30 Final   pCO2, Ven  Date Value Ref Range Status  06/09/2015 36.7 (L) 45 - 50 mmHg Final   Bicarb:  CO2  Date Value Ref Range Status  06/16/2020 27 22 - 32 mmol/L Final   Co2  Date Value Ref Range Status  03/12/2015 27 mmol/L Final     Comment:    22-32 NOTE: New Reference Range  02/17/15    Per Larrie Kass note:  EEG showed right central diphasic sharply contoured slow-wave activity.  MRI of the brain failed to show a structural abnormality.  Autism was diagnosed at age 1 Duchenne muscular dystrophy was diagnosed at age 18. [facility]omnogram October 2018 at [facility] - obstructive sleep apnea EKG 05/11/18 - normal sinus rhythm with normal PR, QRS and corrected QT interval Echocardiogram 05/11/18 - normal biventricular size and function  Wheelchair CPAP Nebulizer [facility] bed Respiratory vest Cough assist Kangaroo pump for feedings Suction

## 2020-10-30 NOTE — Patient Instructions (Signed)
Pediatric Pulmonology  Clinic Discharge Instructions       10/30/20    It was great to see you and Lucas [name] today! We won't make any changes to his plan for now. We will plan to obtain a sleep study in ~6 months - I have placed the order for this.    Followup: Return in about 7 months (around 05/30/2021).  Please call [phone] with any further questions or concerns.

## 2020-11-02 ENCOUNTER — Other Ambulatory Visit (INDEPENDENT_AMBULATORY_CARE_PROVIDER_SITE_OTHER): Payer: Self-pay | Admitting: Family

## 2020-11-02 ENCOUNTER — Telehealth (INDEPENDENT_AMBULATORY_CARE_PROVIDER_SITE_OTHER): Payer: Self-pay | Admitting: Family

## 2020-11-02 DIAGNOSIS — B372 Candidiasis of skin and nail: Secondary | ICD-10-CM

## 2020-11-02 NOTE — Telephone Encounter (Signed)
Who's calling (name and relationship to patient) : Olivia Mackie Potenza mom   Best contact number: [phone]  Provider they see: Rockwell Germany  Reason for call: Mom states that she needs cream and powder for Almon's leg as it hasn't cleared  Up all the way  Call ID:      [place]  Name of prescription:  Pharmacy:

## 2020-11-02 NOTE — Telephone Encounter (Signed)
The refills were sent in today. TG

## 2020-11-17 ENCOUNTER — Encounter (INDEPENDENT_AMBULATORY_CARE_PROVIDER_SITE_OTHER): Payer: Self-pay | Admitting: Student in an Organized Health Care Education/Training Program

## 2020-11-17 ENCOUNTER — Encounter: Payer: Self-pay | Admitting: Physician Assistant

## 2020-11-18 ENCOUNTER — Ambulatory Visit: Payer: Medicaid Other | Attending: Internal Medicine

## 2020-11-18 DIAGNOSIS — Z23 Encounter for immunization: Secondary | ICD-10-CM

## 2020-11-18 NOTE — Progress Notes (Signed)
   Covid-19 Vaccination Clinic  Name:  DELSIN COPEN    MRN: [id number] DOB: 2001/10/28  11/18/2020  Mr. Penniman was observed post Covid-19 immunization for 15 minutes without incident. He was provided with Vaccine Information Sheet and instruction to access the V-Safe system.   Mr. Stamos was instructed to call 911 with any severe reactions post vaccine: Marland Kitchen Difficulty breathing  . Swelling of face and throat  . A fast heartbeat  . A bad rash all over body  . Dizziness and weakness

## 2020-11-19 ENCOUNTER — Other Ambulatory Visit: Payer: Self-pay

## 2020-11-20 ENCOUNTER — Other Ambulatory Visit (INDEPENDENT_AMBULATORY_CARE_PROVIDER_SITE_OTHER): Payer: Self-pay

## 2020-11-20 ENCOUNTER — Telehealth (INDEPENDENT_AMBULATORY_CARE_PROVIDER_SITE_OTHER): Payer: Self-pay | Admitting: Dietician

## 2020-11-20 DIAGNOSIS — Z931 Gastrostomy status: Secondary | ICD-10-CM

## 2020-11-20 MED ORDER — PEDIASURE SIDEKICKS PO LIQD: 1422.0000 mL | Freq: Every day | ORAL | 12 refills | Status: DC

## 2020-11-20 NOTE — Telephone Encounter (Signed)
Mom called back in requesting to speak with [facility]. Is very frustrated and upset. Does not know whether to give [place] his Pedisure with the vitamin or just regular Pedisure. States his has lost weight. Please advise

## 2020-11-20 NOTE — Telephone Encounter (Signed)
Addressed in phone note today.

## 2020-11-20 NOTE — Telephone Encounter (Signed)
Mom reports pt weighed 87 lb at PCP today (not in Epic yet) and she wants to change pt's formula back. Mom reports pt only receiving 5 feeds daily instead of 6 because "no one told me he needed 6 feeds!" RD referenced MyChart message from 09/22/2020, mom verbalized reading the message.   New plan: 6-7AM - 1 bottle + 50 mL (287 mL) @ 285 mL/hr 9-10 AM - 1 bottle + 50 mL (287 mL) @ 285 mL/hr 12-1PM - 1 bottle + 50 mL (287 mL) @ 285 mL/hr 3-4PM - 1 bottle + 50 mL (287 mL) @ 285 mL/hr 6-7PM - 1 bottle + 50 mL (287 mL) @ 285 mL/hr  Mom to start next feed with 287 mL and increase pump to 250 mL/hr today. Tomorrow, mom to increase pump to 285 mL/hr.  If pt continues losing weight, mom should schedule in person appt for weight check with RD.  RD to share with Blair Heys, RN to send orders to home nursing - [facility].

## 2020-11-20 NOTE — Telephone Encounter (Signed)
Who's calling (name and relationship to patient) : Velton Roselle mom   Best contact number: [phone]  Provider they see: Shela Nevin  Reason for call: Patient during last visit was 95 pounds. Patient went to PCP and weighed 87 pounds. Mom would like to switch back to old formula.   Call ID:      PRESCRIPTION REFILL ONLY  Name of prescription:  Pharmacy:

## 2020-11-20 NOTE — Progress Notes (Signed)
New feeding order faxed to [facility]

## 2020-11-20 NOTE — Telephone Encounter (Signed)
  Who's calling (name and relationship to patient) : Olivia Mackie (mom)  Best contact number: [phone]  Provider they see: [name]  Reason for call: Mom states that patient has lost 8 pounds and she is not sure if formula needs to be changed. Requests call back.    PRESCRIPTION REFILL ONLY  Name of prescription:  Pharmacy:

## 2020-11-24 ENCOUNTER — Telehealth (INDEPENDENT_AMBULATORY_CARE_PROVIDER_SITE_OTHER): Payer: Self-pay | Admitting: Dietician

## 2020-11-24 NOTE — Telephone Encounter (Signed)
Who's calling (name and relationship to patient) : Lucas [name]   Best contact number: [phone]  Provider they see: Jean Rosenthal  Reason for call: Patient's feeding number went to 285 as instructed but patient then had very bad gas. Mom went back to 250 please call back to discuss.   Call ID:      PRESCRIPTION REFILL ONLY  Name of prescription:  Pharmacy:

## 2020-11-25 NOTE — Telephone Encounter (Signed)
Spoke with mom and let her know [name] is out of the office today, and at another location. This message would be routed to [name] and [name] so a response can be given in an appropriate time. Mom states understanding and ended the call.

## 2020-11-25 NOTE — Telephone Encounter (Signed)
  Who's calling (name and relationship to patient) :mom/ Lucas [name]   Best contact [phone]  Provider they see:Kat Mikelaites  Reason for call:Following up on the call from yesterday about having the feeding number changed back again because [name] is having stomach pains. Mom would like a call back      [place]  Name of prescription:  Pharmacy:

## 2020-11-26 NOTE — Telephone Encounter (Signed)
RD returned moms call. Mom reports pt with gas when she increased feeds from 237 mL to 285 mL. Mom reports dropping back to 250 mL and pt has tolerated that. Discussed options of slowly increasing to 285 mL per feed or adding 6th feed in. Mom requests 6th feed.  New regimen: 6-7AM - 1 bottle (237 mL) @ 237 mL/hr 9-10 AM - 1 bottle (237 mL) @ 237 mL/hr 12-1PM - 1 bottle (237 mL) @ 237 mL/hr 3-4PM - 1 bottle (237 mL) @ 237 mL/hr 6-7PM - 1 bottle (237 mL) @ 237 mL/hr 9-10 PM - 1 bottle (237 mL) @ 237 mL/hr FWF: 100 mL before and after each feed   - Mom request orders be sent to [facility] asap. All questions answered, mom in agreement with plan.

## 2020-11-26 NOTE — Telephone Encounter (Signed)
O-rder faxed to [facility] at [phone]-

## 2020-11-27 ENCOUNTER — Telehealth (INDEPENDENT_AMBULATORY_CARE_PROVIDER_SITE_OTHER): Payer: Self-pay | Admitting: Dietician

## 2020-11-27 NOTE — Telephone Encounter (Signed)
I faxed the order to [facility] as requested. TG

## 2020-11-27 NOTE — Telephone Encounter (Signed)
RD returned mom's call. Per mom, pt's residuals were checked at 12 PM and they "did not empty at all." Mom reports not being sure what his normal residuals are and is not sure if this is abnormal for him. Mom requests changing formulas back to regular Pediasure with fiber because she does not think pt is tolerating the Pediasure Sidekicks. Mom reports pt is constipated so she is giving him weekly enemas resulting in liquid "do dos." Mom reports not being sure the last time pt saw his GI doctor. RD discussed it was fine to switch back to old Pediasure + Fiber, but that pt needed a multivitamin as 5 cans of regular Pediasure did not have enough of pt's vitamins and minerals. Mom states she will reach out to pt's "[facility]" about getting a liquid MVI. Mom wondering if pt needs adult or children's MVI, given pt's historical issues tolerating MVIs, RD recommended just finding a MVI pt could tolerate and RD will adjusted dose to meet pt's needs. RD explained new order for formula will be sent on Monday when RD is back in the office. In the meantime, mom can continue current formula x5 feeds or switch back to Pediasure + fiber x 5 feeds if mom still has some. Mom reports having Pediasure + fiber and she will start that with his next feed. All questions answered, mom in agreement with plan.  New feeding regimen:  Pediasure 1.0 + fiber             6-7 AM - 237 mL @ 237 mL/hr             9-10 AM - 237 mL @ 237 mL/hr             12-1 PM - 237 mL @ 237 mL/hr             3-4 PM - 237 mL @ 237 mL/hr             6-7 PM - 237 mL @ 237 mL/hr             FWF: 100 mL before and after each feed (1000 mL total)

## 2020-11-27 NOTE — Telephone Encounter (Signed)
Who's calling (name and relationship to patient) : Olivia Mackie (mom)  Best contact number: [phone]  Provider they see: Estanislado Spire  Reason for call:  Olivia Mackie called in requesting to speak to [facility] regarding Omarius's feeds. States he is having a lot of residual liquids(?). Stating she does not think he is going to be able to take 6 feeds. Requesting call back ASAP   Call ID:      [place]  Name of prescription:  Pharmacy:

## 2020-11-30 ENCOUNTER — Telehealth (INDEPENDENT_AMBULATORY_CARE_PROVIDER_SITE_OTHER): Payer: Self-pay | Admitting: Pediatrics

## 2020-11-30 NOTE — Telephone Encounter (Addendum)
Call to mom Olivia Mackie,  Mom wants an order sent to [facility] stating the Miralax has to be mixed in prune juice but cannot advise RN of the volume or time. She reports we have sent an order in the past.   Order for 1 cap of Miralax in 1 cup of prune juice daily increase to BID if no stool in 3 days found on Dr. Shelby Mattocks March 14, 2019 note. Re-written and faxed to [facility]   She also wants the dietician to advise her how much of Enf. Infant/toddler liquid vitamins she is to give him? Her family purchased the wrong vitamin she usually gives Flintstone's vitamins.

## 2020-11-30 NOTE — Telephone Encounter (Signed)
  Who's calling (name and relationship to patient) : Olivia Mackie (mom)  Best contact number: [phone]  Provider they see: [name]  Reason for call: Mom is requesting that orders be sent in for prune juice.    PRESCRIPTION REFILL ONLY  Name of prescription:  Pharmacy:

## 2020-12-01 ENCOUNTER — Encounter (INDEPENDENT_AMBULATORY_CARE_PROVIDER_SITE_OTHER): Payer: Self-pay

## 2020-12-01 NOTE — Telephone Encounter (Signed)
Enfamil infant vitamin, he needs 2 mL daily. Once that is used up, mom needs to make sure he gets the Flintstone's Complete - red box chewable - 1 daily.  Left above information on VM and sent it in a my chart as well. Advised would rec contacting pharm to determine if she can exchange it for the correct one.

## 2020-12-11 ENCOUNTER — Other Ambulatory Visit (INDEPENDENT_AMBULATORY_CARE_PROVIDER_SITE_OTHER): Payer: Self-pay | Admitting: Pediatrics

## 2020-12-11 DIAGNOSIS — G40209 Localization-related (focal) (partial) symptomatic epilepsy and epileptic syndromes with complex partial seizures, not intractable, without status epilepticus: Secondary | ICD-10-CM

## 2020-12-22 ENCOUNTER — Telehealth (INDEPENDENT_AMBULATORY_CARE_PROVIDER_SITE_OTHER): Payer: Self-pay | Admitting: Pediatrics

## 2020-12-22 NOTE — Telephone Encounter (Signed)
  Who's calling (name and relationship to patient) : Anderson Malta with [facility]  Best contact number: [phone]  Provider they see: [name]  Reason for call: Anderson Malta states that they received a referral for this patient but they have been treating him for a long time. She is not sure if this is a new referral for a new problem or if we were just sending an updated referral. She requests call back for clarification.    PRESCRIPTION REFILL ONLY  Name of prescription:  Pharmacy:

## 2020-12-22 NOTE — Telephone Encounter (Signed)
Left message appears MD wanted them to create a stretching plan for nursing staff to follow but will confirm

## 2020-12-24 NOTE — Telephone Encounter (Signed)
Yes that is what the referral states from what I am able to see.

## 2021-01-01 ENCOUNTER — Telehealth (INDEPENDENT_AMBULATORY_CARE_PROVIDER_SITE_OTHER): Payer: Self-pay | Admitting: Pediatrics

## 2021-01-01 NOTE — Telephone Encounter (Signed)
  Who's calling (name and relationship to patient) :mom / Olivia Mackie   Best contact number:[phone]  Provider they see:[name]   Reason for call:Needs prednisolone changed from 89mls to 19mls because he isnt walking anymore PER Dr. Edison Simon at [facility]. Please fax to [facility] in [place]. [phone] attn Ailene Ards the case manager      PRESCRIPTION REFILL ONLY  Name of prescription:  Pharmacy:

## 2021-01-01 NOTE — Telephone Encounter (Signed)
Please inform mother she needs to contact Dr Brent Bulla office for this change, as we did not write for this prescription. Also clarify to mom that the prednisone was decreased because of his poor sleep, it is actually preventing his motor decline.    Carylon Perches MD MPH

## 2021-01-04 NOTE — Telephone Encounter (Signed)
I returned mother's call and informed her we can not send these orders since we didn't recommend them, but I can reach out to Dr Edison Simon ([facility] neurologist),directly to do so. This morning, got [facility] orders form from Judson Roch and emailed it to Dr Edison Simon requesting she please fax them directly to the nursing agency.   Carylon Perches MD MPH

## 2021-01-06 ENCOUNTER — Telehealth (INDEPENDENT_AMBULATORY_CARE_PROVIDER_SITE_OTHER): Payer: Self-pay | Admitting: Family

## 2021-01-06 NOTE — Telephone Encounter (Signed)
  Who's calling (name and relationship to patient) :mom / Olivia Mackie   Best contact [phone]  Provider they BPJ:PETK Goodpasture   Reason for call:Needs a call back about a machine that checks [name] oxygen that she has not received.      PRESCRIPTION REFILL ONLY  Name of prescription:  Pharmacy:

## 2021-01-06 NOTE — Telephone Encounter (Signed)
I called and spoke to Mom. She said that she received pulse oximeter probes in November and was told that the machine would arrive separately but has not received it. I told Mom that I will investigate and get back in touch with her. I contacted [place] and left a message to inquire about this issue. TG

## 2021-01-07 NOTE — Telephone Encounter (Signed)
I called Mom and told her that I had talked with [employer] and that they will be sending the pulse oximeter machine to her. TG

## 2021-01-11 ENCOUNTER — Telehealth (INDEPENDENT_AMBULATORY_CARE_PROVIDER_SITE_OTHER): Payer: Self-pay | Admitting: Pediatrics

## 2021-01-11 DIAGNOSIS — K5900 Constipation, unspecified: Secondary | ICD-10-CM

## 2021-01-11 NOTE — Telephone Encounter (Signed)
Who's calling (name and relationship to patient) : Olivia Mackie (mom)  Best contact number: [phone]  Provider they see: Cherlynn Kaiser  Reason for call:  Mom called in stating that Lucas [name] had an allergic reaction to Lactulose this weekend, he had a rash all over his face per mom. Also states that his laxative is not working anymore, did have to administer an enema this weekend as well. Would like to speak with someone ASAP regarding this.   Call ID:      PRESCRIPTION REFILL ONLY  Name of prescription:  Pharmacy:

## 2021-01-12 ENCOUNTER — Other Ambulatory Visit (INDEPENDENT_AMBULATORY_CARE_PROVIDER_SITE_OTHER): Payer: Self-pay

## 2021-01-12 MED ORDER — PEG 3350 17 GM/SCOOP PO POWD: ORAL | 5 refills | Status: DC

## 2021-01-12 NOTE — Telephone Encounter (Signed)
Face red and not raised, 2 hrs after giving lactulose rash appeared went away and came back lasted all day.  No other symptoms,   Miralax-stopped working- was giving 1 cap a day mixed in Prune juice Prune juice amount varies Docusate Sodium 10 ml a day  Senna tab crushed 1 tab- 8.6 mg a day Reports 1000 ml of water a day  Has not tried Milk of Mag in the past, Cannot swallow pills,

## 2021-01-12 NOTE — Telephone Encounter (Signed)
Call to mom [name]- advised to increase the Miralax as per  [name] mom agrees and will fax order to [facility]

## 2021-01-12 NOTE — Telephone Encounter (Signed)
I recommend restarting Miralax to 2 caps daily.  However, please advise this is not our specialty and if she is concerned about this plan she should discuss it with his primary care doctor, who would be responsible for his care outside of his specialists.   Carylon Perches MD MPH

## 2021-01-14 ENCOUNTER — Other Ambulatory Visit (INDEPENDENT_AMBULATORY_CARE_PROVIDER_SITE_OTHER): Payer: Self-pay | Admitting: Family

## 2021-01-14 DIAGNOSIS — G7101 Duchenne or Becker muscular dystrophy: Secondary | ICD-10-CM

## 2021-01-14 DIAGNOSIS — F840 Autistic disorder: Secondary | ICD-10-CM

## 2021-01-14 DIAGNOSIS — G7100 Muscular dystrophy, unspecified: Secondary | ICD-10-CM

## 2021-01-14 DIAGNOSIS — M4145 Neuromuscular scoliosis, thoracolumbar region: Secondary | ICD-10-CM

## 2021-01-14 DIAGNOSIS — G4733 Obstructive sleep apnea (adult) (pediatric): Secondary | ICD-10-CM

## 2021-01-14 DIAGNOSIS — G40209 Localization-related (focal) (partial) symptomatic epilepsy and epileptic syndromes with complex partial seizures, not intractable, without status epilepticus: Secondary | ICD-10-CM

## 2021-01-15 ENCOUNTER — Telehealth (INDEPENDENT_AMBULATORY_CARE_PROVIDER_SITE_OTHER): Payer: Self-pay | Admitting: Family

## 2021-01-15 NOTE — Telephone Encounter (Signed)
  Who's calling (name and relationship to patient) : Olivia Mackie ( mom)  Best contact number:[phone]  Provider they see: Rockwell [place] / [name]  Reason for call: Miralax is not working and that his stomach is really distended and she is going to have to give him a enema. She asking for another kind of medication to help alleviate this discomfort      PRESCRIPTION REFILL ONLY  Name of prescription:  Pharmacy:

## 2021-01-15 NOTE — Telephone Encounter (Signed)
Cara from [facility] LVM asking for to return her call in regards to the order for a pulse ox.  She needs more information.  [phone]

## 2021-01-18 NOTE — Telephone Encounter (Signed)
I left a message with Moshe Salisbury and asked her to return my call. TG

## 2021-01-18 NOTE — Telephone Encounter (Signed)
This was addressed with Mom last week. She was instructed to contact his PCP. TG

## 2021-01-18 NOTE — Telephone Encounter (Signed)
Moshe Salisbury called me back. Medicaid will not pay for a continuous pulse ox machine because Lucas [name] is not on oxygen. I will contact Mom to let her know. TG

## 2021-01-21 NOTE — Telephone Encounter (Signed)
Mom called back in requesting order to be changed for Lucas [name]'s Miralax. States she would like apple juice and apple sauce to be added to the order along with his prune juice, 1 cap full in AM, 1 cap full in PM. Please advise

## 2021-01-22 ENCOUNTER — Encounter (INDEPENDENT_AMBULATORY_CARE_PROVIDER_SITE_OTHER): Payer: Self-pay

## 2021-01-22 NOTE — Telephone Encounter (Signed)
RN called [facility] left a message and Mickel Baas returned her call- She reports per nurses notes [place] stooled on 2/8 and it was soft, abd soft. RN advised about mom's request for new orders for Miralax and wanted to determine the best way to write the order to prevent weekly orders being sent. She reports to send the order as mix it in juice of mother's choice or applesauce as needed.  RN asked about when he turns 21 will he transfer to CAP ID or straight PDN. She reports that [facility] does not have nurses if he needs nursing it will need to be PDN and it transfers over.  Order completed signed and faxed to [facility]. Copy sent to mom by my chart.

## 2021-01-25 ENCOUNTER — Telehealth (INDEPENDENT_AMBULATORY_CARE_PROVIDER_SITE_OTHER): Payer: Self-pay | Admitting: Family

## 2021-01-25 ENCOUNTER — Telehealth (INDEPENDENT_AMBULATORY_CARE_PROVIDER_SITE_OTHER): Payer: Self-pay | Admitting: Pediatrics

## 2021-01-25 DIAGNOSIS — R143 Flatulence: Secondary | ICD-10-CM

## 2021-01-25 MED ORDER — SIMETHICONE 125 MG PO CHEW: CHEWABLE_TABLET | ORAL | 0 refills | Status: DC

## 2021-01-25 NOTE — Telephone Encounter (Signed)
Call back to mom - she reports the applesauce and miralax is causing him to have increased gas that lasts about 15 min after the dose. She wants an order to give him gas-x each night . RN will send order for qhs and q 6 hrs prn in case needed during the day. She wants and order for the staff to apply foot powder due to foot odor. RN advised that needs to be sent from his primary care doctor because it is not included in our specialty.

## 2021-01-25 NOTE — Telephone Encounter (Signed)
Who's calling (name and relationship to patient) : Lucas [name] mom   Best contact number: [phone]  Provider they see: Rockwell [place]  Reason for call: Mom wants to know if there is anything that can be prescribed to help [name]'s feet that smell really back. Mom would like to talk with someone so she can explain further.   Call ID:      PRESCRIPTION REFILL ONLY  Name of prescription:  Pharmacy:

## 2021-01-25 NOTE — Addendum Note (Signed)
Addended by: Blair Heys B on: 01/25/2021 05:00 PM   Modules accepted: Orders

## 2021-01-25 NOTE — Telephone Encounter (Signed)
Lucas [name] called back to let us know that Lucas [name] is eating applesauce in his G-tube and it is causing a build up which leads to gas. She is needing some gas x called in for her she would also like to speak to Blair Heys. I told her Judson Roch was in clinic and would try to call when she could

## 2021-01-27 ENCOUNTER — Telehealth (INDEPENDENT_AMBULATORY_CARE_PROVIDER_SITE_OTHER): Payer: Self-pay | Admitting: Pediatrics

## 2021-01-27 NOTE — Telephone Encounter (Signed)
ERROR

## 2021-01-27 NOTE — Telephone Encounter (Signed)
Who's calling (name and relationship to patient) : Olivia Mackie Sagan  Best contact number: [phone]  Provider they see: [name]  Reason for call: Mallory Shirk has gone up in price. The liquid is too expensive. The gel isn't going to work. She needs something she can put in a g-tube at a lower price  Call ID:      [place]  Name of prescription:  Pharmacy:

## 2021-01-27 NOTE — Telephone Encounter (Signed)
I called and spoke with Mom. She said that his current stool softener was too expensive at $17/pint per month and asked for cheaper alternative. I told her that I would research this question and call her tomorrow. She agreed with this plan. TG

## 2021-02-04 ENCOUNTER — Telehealth (INDEPENDENT_AMBULATORY_CARE_PROVIDER_SITE_OTHER): Payer: Self-pay | Admitting: Pediatrics

## 2021-02-04 NOTE — Telephone Encounter (Signed)
Who's calling (name and relationship to patient) : Olivia Mackie Grounds mom   Best contact number: [phone]  Provider they see: [name]  Reason for call: Mom picked up gas x tablets but noticed they're a little different from what she normally gets. Mom would like to discuss if its okay to give to patient please call back to discuss  Call ID:      [place]  Name of prescription:  Pharmacy:

## 2021-02-05 NOTE — Telephone Encounter (Signed)
Left message that mom wants to stop the vitamin D and an order needs to be faxed to [facility] fax number given. Advised since we did not start the medication our office did not want to stop it.

## 2021-02-05 NOTE — Telephone Encounter (Signed)
Mom returned call and requests call back at [phone].

## 2021-02-05 NOTE — Telephone Encounter (Signed)
Information attached to mychart message mom sent this morning: The Gas x you asked about [place] can have any form of medication at a dose as low as 40 mg and as high as 125 mg as many as 5 times a day if needed.  Thanks, Judson Roch

## 2021-02-05 NOTE — Telephone Encounter (Signed)
Call transferred to nurse- discussion related to Rolaids- mom reports family member bought Rolaids instead of gas-x and it contains 200 mg of magnesium and is difficult to crush- RN advised if it is hard to crush the concern would be related to it clogging the G tube and would prefer her not use it. The magnesium would not be an issue itself. Mom reports his pulse has gone to 135 to 120 and then normal (with pulse ox) and thinks it was the magnesium. RN advised that is not likely because she also reported a rash from vitamin D - if the rash was itching that would cause an increase in pulse. She reports when they gave him vitamin D before it caused a red rash on his face and increase in thirst which is what RN has documented. She reports Dr. Edison Simon wanted him to take it but she is not going to give it to him. RN advised if she ordered then she has to discontinue it. Mom reports [facility] said they will not send them orders that our office would have to contact them for the order or send the order.  RN ask mom if the vitamin D is a liquid- she reports yes- Asked if it has additives in it and she reads that it contains Sunflower oil. RN advised he could possibly be allergic to the additives in liquid medicines vs the actual medication. He has never had allergy testing but mom would like for that to be done. RN explained if he is intolerant to an additive that could cause a rash and could upset his stomach with the bloating/gas. RN will discuss with [name] and Otila Kluver an determine if we do the referral or ask his PCP to do the referral once it is done will contact mom. Mom agrees with plans.

## 2021-02-09 NOTE — Telephone Encounter (Signed)
Late entry. I talked with Mom the day after her message and explained that there is not a cheaper option that is a liquid. She planned to talk with her care manager about this problem. TG

## 2021-02-10 ENCOUNTER — Ambulatory Visit (INDEPENDENT_AMBULATORY_CARE_PROVIDER_SITE_OTHER): Payer: Medicaid Other | Admitting: Pediatric Gastroenterology

## 2021-02-11 ENCOUNTER — Encounter (INDEPENDENT_AMBULATORY_CARE_PROVIDER_SITE_OTHER): Payer: Self-pay

## 2021-02-18 ENCOUNTER — Telehealth (INDEPENDENT_AMBULATORY_CARE_PROVIDER_SITE_OTHER): Payer: Self-pay | Admitting: Pediatrics

## 2021-02-18 NOTE — Telephone Encounter (Signed)
  Who's calling (name and relationship to patient) : Olivia Mackie (mom)  Best contact number: [phone]  Provider they see: [name]  Reason for call: Mom is requesting call back from Blair Heys, RN.    PRESCRIPTION REFILL ONLY  Name of prescription:  Pharmacy:

## 2021-02-18 NOTE — Telephone Encounter (Signed)
Call to Dr. Elzie Rings office left message with receptionist to request he make a referral to allergist to rule out allergy to sunflower oil or other additive as the cause for his recurrent rash and increased gas.

## 2021-02-22 ENCOUNTER — Encounter (INDEPENDENT_AMBULATORY_CARE_PROVIDER_SITE_OTHER): Payer: Self-pay

## 2021-02-22 NOTE — Telephone Encounter (Signed)
Noted in [place] PCP office completed a televisit with patient and mother. 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 I will write an order for them to stop vitamin d supplementation or any medicine/supplements that contain sunflower seed oil."

## 2021-03-01 ENCOUNTER — Telehealth (INDEPENDENT_AMBULATORY_CARE_PROVIDER_SITE_OTHER): Payer: Self-pay | Admitting: Pediatrics

## 2021-03-01 NOTE — Telephone Encounter (Signed)
  Who's calling (name and relationship to patient) : Olivia Mackie (mom)  Best contact number: [phone]  Provider they see: [name]  Reason for call: Mom states that patient had an allergic reaction to Gas-X over the weekend. She is requesting a call back from Blair Heys.    PRESCRIPTION REFILL ONLY  Name of prescription:  Pharmacy:

## 2021-03-02 ENCOUNTER — Telehealth (INDEPENDENT_AMBULATORY_CARE_PROVIDER_SITE_OTHER): Payer: Self-pay

## 2021-03-02 NOTE — Telephone Encounter (Signed)
Call to mom Olivia Mackie, She reports it seems to be Calcium that also causes rash if he takes too much. He can take the multivitamin with Ca and the Ca in the formula. He is tolerating the vitamin d that she bought that does not have sunflower oil in it. RN will update chart.

## 2021-03-17 NOTE — Progress Notes (Signed)
I emailed him to see if he wants to go ahead and do the sleep study. Follow up on PT was to increase to weekly with daily exercise plan.            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   Neck: Supple, no meningismus. No focal tenderness.  Resp: normal work of breathing. CPAP at night with a full face mask  CV: well perfused-05/11/18 - normal biventricular size and function  Abd: Abdomen non-distended. G-tube c/d/i. No hepatosplenomegaly or mass, history of reflux and constipation  Ext: No deformities, mod muscle wasting, ROM full.  MS: Awake, alert.  Nonverbal, but interactive with mother. Smiles responsively and appears to follow simple commands or to understand at times.    Cranial Nerves: No nystagmus; no ptsosis, face symmetric with full strength of facial muscles, hearing grossly intact, palate elevation is symmetric.  Motor- Able to move arms at least antigravity. No abnormal movements  Sensation: Responds to touch in all extremities.   Coordination: Does not reach for objects.   Gait: wheelchair dependent.    Neurologic - severe generalized weakness with wheelchair  dependence, significant intellectual deficit, no language, dependent upon caregivers for all ADL's  Pulmonary - has sleep apnea, uses CPAP at night  GI - has dysphagia with gastrostomy tube dependence (14 fr 2.0 cm Mini-one) and purees or Gerber pouches by mouth for pleasure  Incontinent of stool and urine  Guardians/Caregivers: Mohd Clemons (mother) ph [phone]  [email or web address] .Marta Lamas nursing  Recent Events:  ER 6/13- fecal impaction  [facility]-12[facility]21[facility]2020: Adding Vitamin D3 1000-2000 units   Continue on CPAP at 11 cm H2O - good compliance  Reduce prednisone from 5 ml to 3 ml (9 mg) - due to side effect of disrupting sleep  Discussed recent gene therapy trials. Discussed sign up for MDA news letter.   Discussed transitioning to adult side, they would like to wait a bit. Will transition after his next birthday    Upcoming Plans:  04/07/2021 at 3:15 PM Dr. Lorelle Gibbs- Stomach took Dr. Aurea Graff place  04/23/2021 at 1:00 PM Dr. Christiana Cellar- Lung- [address]  10/21/2021 at 3:00 PM- Dr. Edison Simon- about new medication trial for Duchenne's  follow up sleep study with CPAP and transcutaneous CO2 monitoring 09/15/2020 Dr. [place] Cellar reports can wait until 2022 after Covid has decreased  Feeding: Last updated: 11/20/2020 DME: [facility] - fax [phone] Formula: Pediasure with fiber Current regimen:  Day feeds: 237 (1 bottle) mL @ 235 mL/hr x 5 feeds @ 6 AM, 12 PM, 3 PM, 6 PM & 9 pm Overnight feeds: none  FWF: 100 mL before and after each feed (1000 mL  total)  PO: limited -  Supplements: nanovitamin  Past/failed meds:  Appears to be allergic to an additive in many liquid medicines- Sunflower oil- causes red, itchy, raised rash on his face  Mom feels reflux medications caused vomiting .DOES NOT TOLERATE any reflux meds  Intolerant of Ca and Vitamin D supplements- May need to re-try due to Osteo- bone but without sunflower oil as an added ingredient  Melatonin- possible  rash and bradycardia  Providers:  [name], MD,  ([place]) ph [phone] fax [phone]  [name], MD [facility] cardiology) ph [phone] fax [phone]  [name], MD [facility] orthopedics) ph  fax [phone]  [name], MD (Neurology/Muscular dystrophy clinic at [facility]) ph [phone] fax [place], [place] [facility] Dentistry) ph [phone] fax [phone]  Estanislado Spire, [place] ([place] [facility] dietitian) ph [phone] fax [phone]  Pat Patrick, MD (Pediatric Pulmonology at [facility]) [phone] Fax: [phone]  [name], MD ([place] in [place]) ph. [phone] Fax [phone] (following for fx arm)  Community support/services:  [facility] ph [phone] Trixie Deis, PT- 1 x a month  [place] [facility] 84 hours per week has respite hours - [place] [phone], fax [phone]   CAP/C  [facility] caseworker -Amy Child psychotherapist with Daun Peacock, RN -[phone]  CAP-C  ph [phone] fax [phone]  Dorian Pod and Earney Hamburg (cousins)    MDA society not helpful to mom  Per [facility] supervisor he will not lose his nursing care when he turns 21 but may have to reapply for Adult Medicaid and they would bill through the Adult Medicaid. He cannot chose [facility] because they do not provide nursing only [facility] level of care.   Equipment:  [facility]- [phone] CPAP at 11,Nebulizer, Suction- pulse ox  [address] and [facility] [phone] Harrel Lemon lift, Sleep safe bed, Wheelchair  05/2019, Bath chair   [place] Rom-Respiratory vest,Cough assist machine- BID- QID  [place] ([facility])-ph: [phone] fax: [phone] Incontinence supplies, Kangaroo, G-tube Mini-one Pediasure with Fiber 1.0, (contact - "Jackie") 14 fr 2.3  cm Mini One button  Hand splints bilaterally- Left is worse than right   **Needs a van. Currently lifting him into seat, then pushing wheelchair the van by a ramp.**   Goals of care:  do remote learning for safety  Advance care planning: Full Code    Psychosocial:  Father is not involved with the patient. Mom has cousin that helps with [name] at times - [place], as well as their daughter [name].   Lives with mom and Attends [employer]- graduating this year  Diagnostics/Screenings:  EEG showed right central diphasic sharply contoured slow-wave activity.    MRI of the brain failed to show a structural abnormality.   Autism was diagnosed at age 16  Duchenne muscular dystrophy was diagnosed at age 67.  Polysomnogram October 2018 at [facility] - obstructive sleep apnea  EKG 05/11/18 - normal sinus rhythm with normal PR, QRS and corrected QT interval  Echocardiogram 05/11/18 - normal biventricular size and function  11/28/19 Echo- ECG: normal rate and rhythm, hear rate 95/minute is actually a bit lower than last 2 ECG's, favorable.Echocardiogram: normal chamber size, wall thickness, squeezing.  05/29/2020: proximal third left humeral shaft fracture with varus angulation.  Osteoporotic bone noted   05/24/2020 abdominal x-ray Large amount of stool at the level of the rectum  06/19/2020 Spinal X-rays: Lungs are clear. Heart is normal size. Normal bowel gas pattern. 30 degrees generalized convex rightward scoliosis in    the thoracolumbar spine.  12/01/2020 Echo:See ov note basically unchanged follow up in 12-18 months

## 2021-03-18 ENCOUNTER — Other Ambulatory Visit: Payer: Self-pay

## 2021-03-18 ENCOUNTER — Encounter (INDEPENDENT_AMBULATORY_CARE_PROVIDER_SITE_OTHER): Payer: Self-pay | Admitting: Pediatrics

## 2021-03-18 ENCOUNTER — Ambulatory Visit (INDEPENDENT_AMBULATORY_CARE_PROVIDER_SITE_OTHER): Payer: Medicaid Other | Admitting: Dietician

## 2021-03-18 ENCOUNTER — Ambulatory Visit (INDEPENDENT_AMBULATORY_CARE_PROVIDER_SITE_OTHER): Payer: Medicaid Other | Admitting: Pediatrics

## 2021-03-18 ENCOUNTER — Other Ambulatory Visit (INDEPENDENT_AMBULATORY_CARE_PROVIDER_SITE_OTHER): Payer: Self-pay | Admitting: Dietician

## 2021-03-18 ENCOUNTER — Ambulatory Visit (INDEPENDENT_AMBULATORY_CARE_PROVIDER_SITE_OTHER): Payer: Medicaid Other

## 2021-03-18 VITALS — HR 90 | Temp 98.5°F | Wt 95.6 lb

## 2021-03-18 DIAGNOSIS — Z931 Gastrostomy status: Secondary | ICD-10-CM

## 2021-03-18 DIAGNOSIS — G7101 Duchenne or Becker muscular dystrophy: Secondary | ICD-10-CM

## 2021-03-18 DIAGNOSIS — M4145 Neuromuscular scoliosis, thoracolumbar region: Secondary | ICD-10-CM

## 2021-03-18 DIAGNOSIS — G7100 Muscular dystrophy, unspecified: Secondary | ICD-10-CM

## 2021-03-18 DIAGNOSIS — R569 Unspecified convulsions: Secondary | ICD-10-CM

## 2021-03-18 DIAGNOSIS — Z7189 Other specified counseling: Secondary | ICD-10-CM

## 2021-03-18 DIAGNOSIS — G40209 Localization-related (focal) (partial) symptomatic epilepsy and epileptic syndromes with complex partial seizures, not intractable, without status epilepticus: Secondary | ICD-10-CM | POA: Diagnosis not present

## 2021-03-18 DIAGNOSIS — J984 Other disorders of lung: Secondary | ICD-10-CM

## 2021-03-18 DIAGNOSIS — G4733 Obstructive sleep apnea (adult) (pediatric): Secondary | ICD-10-CM

## 2021-03-18 MED ORDER — SENNA 8.6 MG PO TABS: 1.0000 | ORAL_TABLET | Freq: Every day | ORAL | 0 refills | Status: DC

## 2021-03-18 MED ORDER — NANOVM T/F PO POWD: 2.0000 | Freq: Every day | ORAL | 12 refills | Status: DC

## 2021-03-18 MED ORDER — PREDNISOLONE 15 MG/5ML PO SYRP: 4.0000 mg | ORAL_SOLUTION | Freq: Every day | ORAL | 3 refills | Status: DC

## 2021-03-18 MED ORDER — CARBAMAZEPINE 100 MG PO CHEW: CHEWABLE_TABLET | ORAL | 5 refills | Status: DC

## 2021-03-18 NOTE — Patient Instructions (Addendum)
-   Continue current feeding regimen. - Start providing pureed foods after feeds up to 3x/day. - Start NanoVM t/f - 2 scoops daily added to feeds.

## 2021-03-18 NOTE — Patient Instructions (Addendum)
Start Nanovitamin, 2 scoops daily.  There is enough calcium and vitamin D in this, he doesn't need another one.   Senna refilled Talk to Dr Lorelle Gibbs about his bowel regimen Keep the same feeding regimen  Ok to do tastes for pleasure Increase Prednisone to 72ml daily Continue Carbamezapine, I will take over the prescription.

## 2021-03-18 NOTE — Progress Notes (Signed)
Patient: Lucas [name] MRN: [id number] Sex: male DOB: 2001/05/08  Provider: Carylon Perches, MD Location of Care: Pediatric Specialist- Pediatric Complex Care Note type: Routine return visit  History of Present Illness: Referral Source: Turner Daniels, MD  History from: patient and prior records Chief Complaint: Pediatric Complex Care   Lucas [name] is a 20 y.o. male with history of  who I am seeing by the request of  for consultation on complex care management. Records were extensively reviewed prior to this appointment and documented as below where appropriate.  Patient was seen prior to this appointment by Lucas [name] for initial intake, and care plan was created (see snapshot).    Patient presents today with . They report their largest concern is    Symptom management:  Broken arm has now   GI- Now giving 3-4 tablespoons apple sauce with apple juice and prune juice.  Giving every day with stool every day (sometimes multiple times daily). Tolerating formula well but now needs vitamins (too expensive, has sunflower seed oil in it).  Wants something covered under insurance.    Not sleeping better, despite decrease in medication.    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  Scheduled with Dr Lorelle Gibbs  Care management needs: Waiting on genetic testing through MDA clinic.     Equipment needs:   Decision making/Advanced care planning:  Diagnostics:   Review of Systems:   Past Medical History Past Medical History:  Diagnosis Date  . Autism   . Autism   . Diarrhea 06/09/2015  . Family history of adverse reaction to anesthesia    MGGM- N/V  . Muscular dystrophy ([place])   . Scoliosis   . Seizures ([place])    Last one 2013  . Transient alteration of awareness   . Viral gastroenteritis   . Yeast infection of the skin 10/24/2020    Surgical History Past Surgical History:  Procedure Laterality Date  .  CIRCUMCISION     at birth  . LAPAROSCOPIC GASTROSTOMY N/A 05/24/2017   Procedure: LAPAROSCOPIC GASTROSTOMY TUBE PLACEMENT;  Surgeon: Stanford Scotland, MD;  Location: [place];  Service: General;  Laterality: N/A;  . SPINE SURGERY N/A    Phreesia 06/29/2020    Family History family history includes Alcohol abuse in his father; Asthma in an other family member; Cancer in his paternal grandfather; Dementia in his paternal grandmother; Heart failure in his maternal grandmother; Hyperlipidemia in his mother and another family member; Hypertension in his maternal grandmother and another family member; Learning disabilities in his mother; Other in his maternal aunt, maternal grandmother, maternal uncle, and mother; Vision loss in an other family member.   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 [employer] 21-22 school year.   He lives with mother.    Allergies Allergies  Allergen Reactions  . Penicillins Hives and Rash    Has patient had a PCN reaction causing immediate rash, facial/tongue/throat swelling, SOB or lightheadedness with hypotension: Yes Has patient had a PCN reaction causing severe rash involving mucus membranes or skin necrosis: Yes Has patient had a PCN reaction that required hospitalization: No Has patient had a PCN reaction occurring within the last 10 years: No If all of the above answers are "NO", then may proceed with Cephalosporin use.   . Multivitamins     Red in face, itchy, bumps  .  Calcitonin Rash  . Esomeprazole Magnesium Nausea And Vomiting  . Omeprazole Other (See Comments)    Constipation and possible rash after drug was stopped  . Simethicone Palpitations and Rash  . Sunflower Oil Itching, Rash and Other (See Comments)    GI upset - Avoid products containing this additive  . Vitamin D Analogs Other (See Comments)    Rash on face if it contains sunflower seed  oil Excessive urine output and thirsty, increase in gas    Medications Current Outpatient Medications on File Prior to Visit  Medication Sig Dispense Refill  . albuterol (PROVENTIL) (2.5 MG/3ML) 0.083% nebulizer solution Give by nebulizer up to 3 times per day as needed for wheezing (Patient not taking: Reported on 04/07/2021) 90 mL 0  . diazepam (DIASTAT ACUDIAL) 10 MG GEL Give 10 mg rectally after 2 minutes of seizures.  May repeat in 10 minutes (Patient not taking: Reported on 04/07/2021) 2 each 5  . Polyethylene Glycol 3350 (PEG 3350) 17 GM/SCOOP POWD Give 1-2 capfuls of Miralax mixed in Prune juice by G tube daily 578 g 5  . diphenhydrAMINE (BENADRYL) 12.5 MG/5ML elixir 5 -10 ml po as needed for hives 3 times a day (Patient not taking: No sig reported)     No current facility-administered medications on file prior to visit.   The medication list was reviewed and reconciled. All changes or newly prescribed medications were explained.  A complete medication list was provided to the patient/caregiver.  Physical Exam Pulse 90   Temp 98.5 F (36.9 C) (Temporal)   Wt 95 lb 9.6 oz (43.4 kg)   SpO2 98%   BMI 19.45 kg/m  Weight for age: <1 %ile (Z= -3.81) based on CDC (Boys, 2-20 Years) weight-for-age data using vitals from 03/18/2021.  Length for age: No height on file for this encounter. BMI: Body mass index is 19.45 kg/m. No exam data present Gen: well appearing neuroaffected  Skin: No rash, No neurocutaneous stigmata. HEENT: Microcephalic, no dysmorphic features, no conjunctival injection, nares patent, mucous membranes moist, oropharynx clear.  Neck: Supple, no meningismus. No focal tenderness. Resp: Clear to auscultation bilaterally CV: Regular rate, normal S1/S2, no murmurs, no rubs Abd: BS present, abdomen soft, non-tender, non-distended. No hepatosplenomegaly or mass Ext: Warm and well-perfused. No deformities, no muscle wasting, ROM full.  Neurological Examination: MS: Awake,  alert.  Nonverbal, but interactive, reacts appropriately to conversation.   Cranial Nerves: Pupils were equal and reactive to light;  No clear visual field defect, no nystagmus; no ptsosis, face symmetric with full strength of facial muscles, hearing grossly intact, palate elevation is symmetric. Motor-Fairly normal tone throughout, moves extremities at least antigravity. No abnormal movements Reflexes- Reflexes 2+ and symmetric in the biceps, triceps, patellar and achilles tendon. Plantar responses flexor bilaterally, no clonus noted Sensation: Responds to touch in all extremities.  Coordination: Does not reach for objects.  Gait: wheelchair dependent, poor head control.     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 male with history of who presents to establish care in the pediatric complex care clinic.  I discussed with family regarding the role of complex care clinic which includes managing complex symptoms, help to coordinate care and provide local resources when possible, and clarifying  goals of care and decision making needs.  Patient will continue to go to subspecialists and PCP for relevant services. A care plan is created for each patient which is in Epic under snapshot, and a physical binder provided to the patient, that can be used for anyone providing care for the patient. Patient seen by case manager, dietician, and integrated behavioral health today. Please see accompanying notes. I discussed case with all involved parties for coordination of care and recommend patient follow their instructions as below.     Symptom management:     Care coordination (other providers)  Care management needs:   Equipment needs:   Decision making/Advanced care  planning:  The CARE PLAN for reviewed and revised to represent the changes above.  This is available in Epic under snapshot, and a physical binder provided to the patient, that can be used for anyone providing care for the patient.   No follow-ups on file.  Carylon Perches MD MPH Neurology,  Neurodevelopment and Neuropalliative care [facility] Child Neurology  [address] [place], [place], [place] [address] Phone: [phone]

## 2021-03-18 NOTE — Progress Notes (Signed)
   Medical Nutrition Therapy - Progress Note Appt start time: 12:00 PM Appt end time: 12:20 PM Reason for referral: Gtube dependence  Referring provider: [name] - [facility] DME: [facility] Pertinent medical hx: Duchenne musclar dystrophy, neuromuscular scoliosis, autism, seizures, FTT, +Gtube  Assessment: Food allergies: none Pertinent Medications: see medication list Vitamins/Supplements: none Pertinent labs: no recent labs in Epic  (4/7) Anthropometrics: The child was weighed, measured, and plotted on the CDC growth chart. Wt: 43.4 kg (<0.01 %)  Z-score: -3.81  (9/30) Anthropometrics: The child was weighed, measured, and plotted on the CDC growth chart. Ht: 149.3 cm (<1 %)  Z-score: -3.77 Wt: 41.1 kg (<1 %)  Z-score: -4.32 BMI: 18.4 (3 %)  Z-score: -1.85  (3/18) Wt: 39.4 kg  (7/3) Wt: 42.1 kg (3/20) Wt: 42.5 kg (2/17) Wt: 43.3 kg (11/21) Wt: 42.2 kg  Estimated minimum caloric needs: 28 kcal/kg/day (based on current regimen) Estimated minimum protein needs: 0.85 g/kg/day (DRI) Estimated minimum fluid needs: 45 mL/kg/day ([place]oll[place] Segar)  Primary concerns today: Follow-up for Gtube dependence. Mom and home health nurse accompanied pt to appt today.  Dietary Intake Hx: Usual feeding regimen: Pediasure 1.0 + fiber - 5 cans daily with and additional 1000 mL free water  6-7 AM - 237 mL @ 237 mL/hr  9-11 AM - 237 mL @ 237 mL/hr  12-1 PM - 237 mL @ 237 mL/hr  3-4 PM - 237 mL @ 237 mL/hr  6-7 PM - 237 mL @ 237 mL/hr  FWF: 100 mL before and after each feed (1000 mL total) PO foods: none currently, but has showed interest   GI: daily - mom started applesauce mixed in apple juice GU: no issues  Physical Activity: wheel-chair bound  Estimated caloric intake: 27 kcal/kg/day - meets 96% of estimated needs Estimated protein intake: 0.8 g/kg/day - meets 94% of estimated needs Estimated fluid intake: 46 mL/kg/day - meets 102% of estimated needs Micronutrient intake: Vitamin A 700  mcg  Vitamin C 115 mg  Vitamin D 30 mcg  Vitamin E 15 mg  Vitamin K 90 mcg  Vitamin B1 (thiamin) 1.5 mg  Vitamin B2 (riboflavin) 1.7 mg  Vitamin B3 (niacin) 16 mg  Vitamin B5 (pantothenic acid) 6.5 mg  Vitamin B6 1.7 mg  Vitamin B7 (biotin) 40 mcg  Vitamin B9 (folate) 300 mcg  Vitamin B12 2.4 mcg  Choline 400 mg  Calcium 1650 mg  Chromium 45 mcg  Copper 700 mcg  Fluoride 0 mg  Iodine 115 mcg  Iron 13.5 mg  Magnesium 200 mg  Manganese 2.3 mg  Molybdenum 45 mcg  Phosphorous 1250 mg  Selenium 40 mcg  Zinc 8.5 mg  Potassium 2350 mg  Sodium 450 mg  Chloride 1150 mg  Fiber 15 g   Nutrition Diagnosis: (9/10) Inadequate oral intake related to medical status and dysphagia as evidence by pt dependent on Gtube to meet nutritional needs.  Intervention: Discussed current regimen and need for MVI. Mom reports pt reports being hungry frequently so caregivers interested in feeding purees. Discussed recommendations below. All questions answered, family in agreement with plan. Recommendations: - Continue current feeding regimen. - Start providing pureed foods after feeds up to 3x/day. - Start NanoVM t/f - 2 scoops daily added to feeds.  Teach back method used.  Monitoring/Evaluation: Goals to Monitor: - Wt trends - TF tolerance  Follow-up in 6 months with nutrition.  Total time spent in counseling: 20 minutes.

## 2021-03-18 NOTE — Progress Notes (Signed)
RD faxed prescription for NanoVM t/f to [facility] @ [phone]. Successful result received.

## 2021-03-22 ENCOUNTER — Telehealth (INDEPENDENT_AMBULATORY_CARE_PROVIDER_SITE_OTHER): Payer: Self-pay | Admitting: Pediatrics

## 2021-03-22 DIAGNOSIS — K5900 Constipation, unspecified: Secondary | ICD-10-CM

## 2021-03-22 DIAGNOSIS — R143 Flatulence: Secondary | ICD-10-CM

## 2021-03-22 NOTE — Telephone Encounter (Signed)
  Who's calling (name and relationship to patient) :Andree Coss ( mom)  Best contact number: [phone]  Provider they see: [facility]   Reason for call: Mom called and the 10 ml of stool softener with the mix of other things he is taking is causing him to have explosive liquid runny stool . Mom would like a call about decreasing the stool softener please      PRESCRIPTION REFILL ONLY  Name of prescription:  Pharmacy:

## 2021-03-22 NOTE — Telephone Encounter (Signed)
Please advise 

## 2021-03-23 ENCOUNTER — Encounter (INDEPENDENT_AMBULATORY_CARE_PROVIDER_SITE_OTHER): Payer: Self-pay | Admitting: Dietician

## 2021-03-23 NOTE — Addendum Note (Signed)
Addended by: Blair Heys B on: 03/23/2021 04:42 PM   Modules accepted: Orders

## 2021-03-23 NOTE — Telephone Encounter (Signed)
This is not a new med, he had this same dose even when he was constipated. I recommend decreasing the juice, if that is what has made the difference.  Dr Lorelle Gibbs will review all his GI medications at her upcoming appointment.    Carylon Perches MD MPH

## 2021-03-23 NOTE — Telephone Encounter (Signed)
Call to [name], she said pharmacy does not have the vitamin that was ordered at his last visit. RN explained that has to be sent from a DME company. RN will determine if [name] or Dietician sent it in Mom is also requesting order for nursing agency that the Senna and Docusate are prn mom's discretion. RN advised will send in to cover until he sees GI but in the future she needs to call [facility] or have the nurse there call the supervisor to send an order so it is written the way they need it to be written and on file.   Order written to obtain mom's permission prior to giving any prn med and before docusate and senna  Order for NanoVitamin sent to [facility] per [place]

## 2021-03-23 NOTE — Telephone Encounter (Signed)
Mom has called back regarding medication. Please return call to [phone].

## 2021-04-05 ENCOUNTER — Telehealth (INDEPENDENT_AMBULATORY_CARE_PROVIDER_SITE_OTHER): Payer: Self-pay | Admitting: Emergency Medicine

## 2021-04-05 NOTE — Telephone Encounter (Signed)
Per the label on Nano Vitamin for tube feedings "? Allergen Tested at No Detectable Limit (NDL) for egg, dairy, fish, crustaceans, soy, gluten, corn, tree nuts, and peanut." Confirmed with mom color of label on Nano vitamin. She misunderstood the above information on the label. She reports she gave it to him and on Saturday he broke out in a raised mosquito bite like rash-gets deep red- did not give it yesterday and it was improving today without it is gone. RN advised will send note to [name] for further instructions- hold vitamin for now.

## 2021-04-05 NOTE — Telephone Encounter (Signed)
  Who's calling (name and relationship to patient) :Tracee ( mom)  Best contact number: [phone]  Provider they see: [name]  Reason for call: Mom calling patient was taking a new vitamin supplement prescribed for him. The patient had a severe allergic reaction and broke out all over his face in red bumps.Mom said the supplements have tree nuts in them and  Although he has never had any nuts before she believes this is what has caused the reaction. Mom would like a call back to discuss the options for a new supplement      PRESCRIPTION REFILL ONLY  Name of prescription:  Pharmacy:

## 2021-04-06 ENCOUNTER — Encounter (INDEPENDENT_AMBULATORY_CARE_PROVIDER_SITE_OTHER): Payer: Self-pay

## 2021-04-06 ENCOUNTER — Telehealth (INDEPENDENT_AMBULATORY_CARE_PROVIDER_SITE_OTHER): Payer: Self-pay | Admitting: Family

## 2021-04-06 NOTE — Telephone Encounter (Signed)
My chart message sent to mom with Dr. Shelby Mattocks response so mom can share with in-home nursing staff. Also reminded her about her appointment with Dr. Lorelle Gibbs on 4/27 at 3:15 PM

## 2021-04-06 NOTE — Telephone Encounter (Signed)
I agree with continuing to hold the vitamin, but I recommend he see his PCP to again discuss referral to an allergist or ask that he be tested.  I don't know what I can give him if even this causes allergy.    Carylon Perches MD MPH

## 2021-04-06 NOTE — Telephone Encounter (Signed)
  Who's calling (name and relationship to patient) :Mom / Holley Bouche   Best contact [phone]  Provider they see:[name]   Reason for call:mom called to follow up on what she should do about [place] not reacting well to the Vitamins that were prescribed to him, she has requested a call back as soon as possible. Please advise      PRESCRIPTION REFILL ONLY  Name of prescription:  Pharmacy:

## 2021-04-07 ENCOUNTER — Telehealth (INDEPENDENT_AMBULATORY_CARE_PROVIDER_SITE_OTHER): Payer: Medicaid Other | Admitting: Pediatric Gastroenterology

## 2021-04-07 ENCOUNTER — Other Ambulatory Visit (INDEPENDENT_AMBULATORY_CARE_PROVIDER_SITE_OTHER): Payer: Self-pay | Admitting: Pediatrics

## 2021-04-07 ENCOUNTER — Other Ambulatory Visit: Payer: Self-pay

## 2021-04-07 ENCOUNTER — Encounter (INDEPENDENT_AMBULATORY_CARE_PROVIDER_SITE_OTHER): Payer: Self-pay | Admitting: Pediatric Gastroenterology

## 2021-04-07 DIAGNOSIS — K5900 Constipation, unspecified: Secondary | ICD-10-CM

## 2021-04-07 DIAGNOSIS — Z931 Gastrostomy status: Secondary | ICD-10-CM

## 2021-04-07 NOTE — Patient Instructions (Addendum)
1)Stop docusate. Continue Miralax daily and senna PRN if no bowel movement by late afternoon. 2)Recommend transition to adult GI as he is turning 20 this summer but we are available to answer any questions until he is established with adult care. One practice is [facility]. More information can be found at [email or web address] and can be contacted at [phone]      At [facility], we are committed to providing exceptional care. You will receive a patient satisfaction survey through text or email regarding your visit today. Your opinion is important to me. Comments are appreciated.

## 2021-04-07 NOTE — Progress Notes (Signed)
This is a [facility] E-Visit follow up consult provided via [place] and their parent, Lucas [name], consented to an E-Visit consult today.  Location of patient: Lucas [name] is at home Location of provider: Nena Alexander, MD is at [facility] remotely Patient was referred by Lucas Late, MD   The following participants were involved in this E-Visit: Nena Alexander, MD, Kathrynn Humble, LPN, [name], patient, Lucas [name], mom  This visit was done via [place]   Chief Complain/ Reason for E-Visit today: diarrhea Total time on call: 15 minutes Follow up: transition to adult GI   I spent 40 minutes dedicated to the care of this patient on the date of this encounter to include pre-visit review of prior GI notes, Neurology note, RD note, and face-to-face time with the patient.      Pediatric G[facility]ology New Consultation Visit   REFERRING PROVIDER:  [name], MD [address] [place] [facility] [place],  [name] [id number]   ASSESSMENT:     I had the pleasure of seeing Lucas [name], 20 y.o. male (DOB: [birth year]) with history of muscular dystrophy, G-tube dependence, constipation, and seizures who I saw in consultation today for evaluation of diarrhea. My impression is that the diarrhea is secondary to excessive laxative use. He used to have issues with constipation and on docusate, Miralax, and senna. Recommend stopping docusate and reducing Miralax to daily and senna as needed. He is stable on his G-tube feeds so will not make any changes and will continue every 3 month tube changes with home nursing. Given his age, recommend that he transition to adult care. PLAN:       1)Stop docusate. Continue Miralax daily and senna PRN if no bowel movement by [name] afternoon. 2)Recommend transition to adult GI as he is turning 20 this summer but we are available to answer any questions until he is established with adult care. Thank you for  allowing [place] to participate in the care of your patient      HISTORY OF PRESENT ILLNESS: Lucas [name] is a 20 y.o. male (DOB: 2001-03-18) with history of autism, muscular dystrophy, and G-tube dependence who is seen in consultation for evaluation of diarrhea. History was obtained from mother and home nurse.   He has been doing well from a GI standpoint but mother notes that he has watery stools, most likely due to his medications. He is on docusate, Miralax 1 cap two times a day, and senna. In addition they are giving him apple, pear, and prune juice. On the above regimen, he has very watery stools that occur 2-3 times per day. He is on Pediasure with Fiber 5 times per day (274ml), which is a stable regimen for him. He does not eat anything by mouth but mother trying to add applesauce to his diet.His G-tube fits well, according to mother, and home nursing will change his tube every 3 months.  He has stopped the Nexium for some time because he had an allergic reaction to it. He does not have any issues with vomiting at this time. PAST MEDICAL HISTORY: Past Medical History:  Diagnosis Date  . Autism   . Autism   . Diarrhea 06/09/2015  . Family history of adverse reaction to anesthesia    MGGM- N/V  . Muscular dystrophy ([place])   . Scoliosis   . Seizures ([place])    Last one 2013  . Transient alteration of awareness   . Viral gastroenteritis   .  Yeast infection of the skin 10/24/2020   Immunization History  Administered Date(s) Administered  . Influenza,inj,Quad PF,6+ Mos 09/10/2020  . Moderna SARS-COV2 Booster Vaccination 11/18/2020  . Moderna Sars-Covid-2 Vaccination 03/24/2020, 03/24/2020, 04/20/2020   PAST SURGICAL HISTORY: Past Surgical History:  Procedure Laterality Date  . CIRCUMCISION     at birth  . LAPAROSCOPIC GASTROSTOMY N/A 05/24/2017   Procedure: LAPAROSCOPIC GASTROSTOMY TUBE PLACEMENT;  Surgeon: Stanford Scotland, MD;  Location: [facility];  Service: General;  Laterality: N/A;  .  SPINE SURGERY N/A    [employer]reesia 06/29/2020   SOCIAL HISTORY: Social History   Socioeconomic History  . Marital status: Single    Spouse name: Not on file  . Number of children: Not on file  . Years of education: Not on file  . Highest education level: Not on file  Occupational History  . Not on file  Tobacco Use  . Smoking status: Passive Smoke Exposure - Never Smoker  . Smokeless tobacco: Never Used  Vaping Use  . Vaping Use: Never used  Substance and Sexual Activity  . Alcohol use: No  . Drug use: No  . Sexual activity: Never  Other Topics Concern  . Not on file  Social History Narrative   Geza is a 12th grade student. His private duty nurse attends school with him. [employer] 21-22 school year.   He lives with mother.   Social Determinants of Health   Financial Resource Strain: Not on file  Food Insecurity: Not on file  Transportation Needs: Not on file  Physical Activity: Not on file  Stress: Not on file  Social Connections: Not on file   FAMILY HISTORY: family history includes Alcohol abuse in his father; Asthma in an other family member; Cancer in his paternal grandfather; Dementia in his paternal grandmother; Heart failure in his maternal grandmother; Hyperlipidemia in his mother and another family member; Hypertension in his maternal grandmother and another family member; Learning disabilities in his mother; Other in his maternal aunt, maternal grandmother, maternal uncle, and mother; Vision loss in an other family member.   REVIEW OF SYSTEMS:  The balance of 12 systems reviewed is negative except as noted in the HPI.  MEDICATIONS: Current Outpatient Medications  Medication Sig Dispense Refill  . carbamazepine (TEGRETOL) 100 MG chewable tablet TAKE ONE AND ONE-HALF TABLETS BY MOUTH TWICE DAILY 93 tablet 5  . Polyethylene Glycol 3350 (PEG 3350) 17 GM/SCOOP POWD Give 1-2 capfuls of Miralax mixed in Prune juice by G tube daily 578 g 5  . prednisoLONE (PRELONE)  15 MG/5ML syrup Place 1.3 mLs (3.9 mg total) into feeding tube daily. 40 mL 3  . albuterol (PROVENTIL) (2.5 MG/3ML) 0.083% nebulizer solution Give by nebulizer up to 3 times per day as needed for wheezing (Patient not taking: Reported on 04/07/2021) 90 mL 0  . diazepam (DIASTAT ACUDIAL) 10 MG GEL Give 10 mg rectally after 2 minutes of seizures.  May repeat in 10 minutes (Patient not taking: Reported on 04/07/2021) 2 each 5  . diphenhydrAMINE (BENADRYL) 12.5 MG/5ML elixir 5 -10 ml po as needed for hives 3 times a day (Patient not taking: No sig reported)    . senna (SENOKOT) 8.6 MG TABS tablet 1 tablet (8.6 mg total) by Per NG tube route at bedtime. (Patient not taking: Reported on 04/07/2021) 30 tablet 0   No current facility-administered medications for this visit.   ALLERGIES: Penicillins, Multivitamins, Calcitonin, Esomeprazole magnesium, Omeprazole, Simethicone, Sunflower oil, and Vitamin d analogs  VITAL SIGNS: VITALS  Not obtained due to the nature of the visit PHYSICAL EXAM: General: non-verbal, not in acute distress, atrophic extremities  Nena Alexander, MD Clinical Assistant Professor of Pediatric Gastroenterology

## 2021-04-09 ENCOUNTER — Other Ambulatory Visit (INDEPENDENT_AMBULATORY_CARE_PROVIDER_SITE_OTHER): Payer: Self-pay | Admitting: Family

## 2021-04-09 DIAGNOSIS — G40209 Localization-related (focal) (partial) symptomatic epilepsy and epileptic syndromes with complex partial seizures, not intractable, without status epilepticus: Secondary | ICD-10-CM

## 2021-04-09 DIAGNOSIS — G7101 Duchenne or Becker muscular dystrophy: Secondary | ICD-10-CM

## 2021-04-09 DIAGNOSIS — R339 Retention of urine, unspecified: Secondary | ICD-10-CM

## 2021-04-09 DIAGNOSIS — R159 Full incontinence of feces: Secondary | ICD-10-CM

## 2021-04-09 DIAGNOSIS — M4145 Neuromuscular scoliosis, thoracolumbar region: Secondary | ICD-10-CM

## 2021-04-09 DIAGNOSIS — R32 Unspecified urinary incontinence: Secondary | ICD-10-CM

## 2021-04-09 DIAGNOSIS — F840 Autistic disorder: Secondary | ICD-10-CM

## 2021-04-09 NOTE — Progress Notes (Signed)
Orders and LMN needed for overage amount of adult wipes. TG

## 2021-04-14 ENCOUNTER — Telehealth (INDEPENDENT_AMBULATORY_CARE_PROVIDER_SITE_OTHER): Payer: Self-pay | Admitting: Pediatrics

## 2021-04-14 NOTE — Telephone Encounter (Signed)
Forwarded to Neuro pool.

## 2021-04-14 NOTE — Telephone Encounter (Signed)
Who's calling (name and relationship to patient) : Tracty (mom)  Best contact number: [phone]  Provider they see:  Reason for call:  Mom called in requesting to speak with either Sarah, [name], or Otila Kluver regarding gas issues with Jayce. States that is happening at least 1 time a week, she is giving him enemas for relief. Would like to know if there is something he can take that will aid with this. States he is unable to take "adult gas relief". Wants to make sure there is nothing that will make his have an allergic reaction.   Call ID:      PRESCRIPTION REFILL ONLY  Name of prescription:  Pharmacy:

## 2021-04-15 ENCOUNTER — Encounter (INDEPENDENT_AMBULATORY_CARE_PROVIDER_SITE_OTHER): Payer: Self-pay

## 2021-04-15 NOTE — Telephone Encounter (Signed)
Mom calling back today wanting to get someone to call her about the phone note from yesterday

## 2021-04-15 NOTE — Telephone Encounter (Signed)
Sarah, please share the options Dr Lorelle Gibbs provided with mother.  His symptoms have gotten out of my area of expertise, hence the GI referral. If mother wants to speak further or if Dr Danielle Dess recommendations aren't effective, I recommend she schedule a return visit with Dr Lorelle Gibbs to discuss these further symptoms.    Carylon Perches MD MPH

## 2021-04-15 NOTE — Telephone Encounter (Signed)
Called mom and relayed the advice per Dr. Lorelle Gibbs - Some options to help with gas include enemas to help dispel gas (as they are doing), SIBO therapy with metronidazole for 2 weeks, or vent through the G-tube. Mom stated that the gas build up is only once a week, and they have tried to vent through the g-tube, but that does not work. Mom stated that they have tried the enemas, and this does work. Mom stated that she will continue to use the enemas to expel the gas, and will call back if this stops working. Mom also is concerned about getting Pediasure and would like to speak to Blair Heys, RN about this. Mom had no additional questions.

## 2021-04-16 NOTE — Telephone Encounter (Signed)
Call to mom Olivia Mackie- she reports she has 3 cases of Pediasure with fiber Vanilla flavor. She was told by [facility] that the company that makes the formula closed and they cannot get it any longer. They sent a case of [employer] after st After the nurse started his feeding he started breaking out in raised bumps like he does with sunflower oil products. She reports he has tried about 4 different formulas that he did not tolerate. She does not think he has tried [employer]. RN advised she will contact [facility] and see what formulas they can get and compare ingredients but will be next week before it can be done. She agrees Per a comparison list online the following are substitutes for [employer] Pediatric, Boost Kid  Essentials, Horticulturist, commercial,

## 2021-04-19 ENCOUNTER — Encounter (INDEPENDENT_AMBULATORY_CARE_PROVIDER_SITE_OTHER): Payer: Self-pay | Admitting: Pediatrics

## 2021-04-19 ENCOUNTER — Telehealth (INDEPENDENT_AMBULATORY_CARE_PROVIDER_SITE_OTHER): Payer: Self-pay | Admitting: Pediatrics

## 2021-04-19 NOTE — Telephone Encounter (Signed)
Who's calling (name and relationship to patient) : Olivia Mackie (mom)  Best contact number: [phone]  Provider they see: [name]  Reason for call:  Mom called in stating that Malden had an allergic reaction to his Senokot over the weekend, turned red, increased heart rate, and "was acting weird".  Mom is also asking if Ashad becomes allergic to his seizure medication, what would the next steps be and how would they treat him. Please advise   Call ID:      PRESCRIPTION REFILL ONLY  Name of prescription:  Pharmacy:

## 2021-04-20 ENCOUNTER — Encounter (INDEPENDENT_AMBULATORY_CARE_PROVIDER_SITE_OTHER): Payer: Self-pay

## 2021-04-20 NOTE — Telephone Encounter (Signed)
Please advise mother that the patient should not "develop" an allergy to something he has already been taking.  It is very possible the symptoms she saw were due to something else, but it is hard for [place] to say. Unfortunately, allergy and immunology is outside the scope of what our practice manages.  I would be happy to refer her to an allergist, but given the PCP said he did not need an allergist, I recommend mother discuss this with the PCP for him to evaluate first and then put in a referral if he can not find the cause.    Carylon Perches MD MPH

## 2021-04-22 ENCOUNTER — Telehealth: Payer: Self-pay

## 2021-04-22 ENCOUNTER — Encounter (INDEPENDENT_AMBULATORY_CARE_PROVIDER_SITE_OTHER): Payer: Self-pay

## 2021-04-22 NOTE — Telephone Encounter (Signed)
I connected by phone with Lucas [name] and/or patient's caregiver on 04/22/2021 at 2:20 PM to discuss the potential vaccination through our Homebound vaccination initiative.   Prevaccination Checklist for COVID-19 Vaccines  1.  Are you feeling sick today? no  2.  Have you ever received a dose of a COVID-19 vaccine?  yes      If yes, which one? Moderna   How many dose of Covid-19 vaccine have your received and dates ? 3, 03/24/2020, 04/20/2020, 11/18/2020   Check all that apply: I live in a long-term care setting. no  I have been diagnosed with a medical condition(s). 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 no  I work in a long-term care facility, correctional facility, hospital, restaurant, retail setting, school, or other setting with high exposure to the public. no  4. Do you have a health condition or are you undergoing treatment that makes you moderately or severely immunocompromised? (This would include treatment for cancer or HIV, receipt of organ transplant, immunosuppressive therapy or high-dose corticosteroids, CAR-T-cell therapy, hematopoietic cell transplant [HCT], DiGeorge syndrome or Wiskott-Aldrich syndrome)  no  5. Have you received hematopoietic cell transplant (HCT) or CAR-T-cell therapies since receiving COVID-19 vaccine? no  6.  Have you ever had an allergic reaction: (This would include a severe reaction [ e.g., anaphylaxis] that required treatment with epinephrine or EpiPen or that caused you to go to the hospital.  It would also include an allergic reaction that occurred within 4 hours that caused hives, swelling, or respiratory distress, including wheezing.) A.  A previous dose of COVID-19 vaccine. no  B.  A vaccine or injectable therapy that contains multiple components, one of which is a COVID-19 vaccine component, but it is not known which component elicited the immediate reaction. no  C.  Are you  allergic to polyethylene glycol? no  D. Are you allergic to Polysorbate, which is found in some vaccines, film coated tablets and intravenous steroids?  no   7.  Have you ever had an allergic reaction to another vaccine (other than COVID-19 vaccine) or an injectable medication? (This would include a severe reaction [ e.g., anaphylaxis] that required treatment with epinephrine or EpiPen or that caused you to go to the hospital.  It would also include an allergic reaction that occurred within 4 hours that caused hives, swelling, or respiratory distress, including wheezing.)  no   8.  Have you ever had a severe allergic reaction (e.g., anaphylaxis) to something other than a component of the COVID-19 vaccine, or any vaccine or injectable medication?  This would include food, pet, venom, environmental, or oral medication allergies.  yes, patient has multiple allergies but most severe is to PCN.  Check all that apply to you:  Am a male between ages 6 and 61 years old  no  Women 49 through 20 years of age can receive any FDA-authorized or -approved COVID-19 vaccine. However, they should be informed of the rare but increased risk of thrombosis with thrombocytopenia syndrome (TTS) after receipt of the [employer] Vaccine and the availability of other FDA-authorized and -approved COVID-19 vaccines. People who had TTS after a first dose of Janssen vaccine should not receive a subsequent dose of Janssen product    Am a male between ages 9 and 63 years old  no Males 5 through 20 years of age may receive the correct formulation of Pfizer-BioNTech COVID-19 vaccine. Males 18 and older can receive  any FDA-authorized or -approved vaccine. However, people receiving an mRNA COVID-19 vaccine, especially males 76 through 20 years of age and their parents/legal representative (when relevant), should be informed of the risk of developing myocarditis (an inflammation of the heart muscle) or pericarditis (inflammation of  the lining around the heart) after receipt of an mRNA vaccine. The risk of developing either myocarditis or pericarditis after vaccination is low, and lower than the risk of myocarditis associated with SARS-CoV-2 infection in adolescents and adults. Vaccine recipients should be counseled about the need to seek care if symptoms of myocarditis or pericarditis develop after vaccination     Have a history of myocarditis or pericarditis  no Myocarditis or pericarditis after receipt of the first dose of an mRNA COVID-19 vaccine series but before administration of the second dose  Experts advise that people who develop myocarditis or pericarditis after a dose of an mRNA COVID-19 vaccine not receive a subsequent dose of any COVID-19 vaccine, until additional safety data are available.  Administration of a subsequent dose of COVID-19 vaccine before safety data are available can be considered in certain circumstances after the episode of myocarditis or pericarditis has completely resolved. Until additional data are available, some experts recommend a [name] COVID-19 vaccine be considered instead of an mRNA COVID-19 vaccine. Decisions about proceeding with a subsequent dose should include a conversation between the patient, their parent/legal representative (when relevant), and their clinical team, which may include a cardiologist.    Have been treated with monoclonal antibodies or convalescent serum to prevent or treat COVID-19  no Vaccination should be offered to people regardless of history of prior symptomatic or asymptomatic SARS-CoV-2 infection. There is no recommended minimal interval between infection and vaccination.  However, vaccination should be deferred if a patient received monoclonal antibodies or convalescent serum as treatment for COVID-19 or for post-exposure prophylaxis. This is a precautionary measure until additional information becomes available, to avoid interference of the antibody treatment  with vaccine-induced immune responses.  Defer COVID-19 vaccination for 30 days when a passive antibody product was used for post-exposure prophylaxis.  Defer COVID-19 vaccination for 90 days when a passive antibody product was used to treat COVID-19.     Diagnosed with Multisystem Inflammatory Syndrome (MIS-C or MIS-A) after a COVID-19 infection  no It is unknown if people with a history of MIS-C or MIS-A are at risk for a dysregulated immune response to COVID-19 vaccination.  People with a history of MIS-C or MIS-A may choose to be vaccinated. Considerations for vaccination may include:   Clinical recovery from MIS-C or MIS-A, including return to normal cardiac function   Personal risk of severe acute COVID-19 (e.g., age, underlying conditions)   High or substantial community transmission of SARS-CoV-2 and personal increased risk of reinfection.   Timing of any immunomodulatory therapies (general best practice guidelines for immunization can be consulted for more information [email or web address])   It has been 90 days or more since their diagnosis of MIS-C   Onset of MIS-C occurred before any COVID-19 vaccination   A conversation between the patient, their guardian(s), and their clinical team or a specialist may assist with COVID-19 vaccination decisions. Healthcare providers and health departments may also request a consultation from the [employer] at [email or web address].     Have a bleeding disorder  no Take a blood thinner  no As with all vaccines, any COVID-19 vaccine product may be given to these patients, if a physician familiar with the patient's bleeding risk  determines that the vaccine can be administered intramuscularly with reasonable safety.  ACIP recommends the following technique for intramuscular vaccination in patients with bleeding disorders or  taking blood thinners: a fine-gauge needle (23-gauge or smaller caliber) should be used for the vaccination, followed by firm pressure on the site, without rubbing, for at least 2 minutes.  People who regularly take aspirin or anticoagulants as part of their routine medications do not need to stop these medications prior to receipt of any COVID-19 vaccine.    Have a history of heparin-induced thrombocytopenia (HIT)  no Although the etiology of TTS associated with the [name] COVID-19 vaccine is unclear, it appears to be similar to another rare immune-mediated syndrome, heparin-induced thrombocytopenia (HIT). People with a history of an episode of an immune-mediated syndrome characterized by thrombosis and thrombocytopenia, such as HIT, should be offered a currently FDA-approved or FDA-authorized mRNA COVID-19 vaccine if it has been ?90 days since their TTS resolved. After 90 days, patients may be vaccinated with any currently FDA-approved or FDA-authorized COVID-19 vaccine, including Janssen COVID-19 Vaccine. However, people who developed TTS after their initial [name] vaccine should not receive a Janssen booster dose.  Experts believe the following factors do not make people more susceptible to TTS after receipt of the [employer]. People with these conditions can be vaccinated with any FDA-authorized or - approved COVID-19 vaccine, including the [employer] COVID-19 Vaccine:   A prior history of venous thromboembolism   Risk factors for venous thromboembolism (e.g., inherited or acquired thrombophilia including Factor V Leiden; prothrombin gene 20210A mutation; antiphospholipid syndrome; protein C, protein S or antithrombin deficiency   A prior history of other types of thromboses not associated with thrombocytopenia   Pregnancy, post-partum status, or receipt of hormonal contraceptives (e.g., combined oral contraceptives, patch, ring)   Additional recipient education materials can be found  at [email or web address]-[email or web address]/ vaccines/safety/JJUpdate.html.    Am currently pregnant or breastfeeding  no Vaccination is recommended for all people aged 40 years and older, including people that are:   Pregnant   Breastfeeding   Trying to get pregnant now or who might become pregnant in the future   Pregnant, breastfeeding, and post-partum people 39 through 20 years of age should be aware of the rare risk of TTS after receipt of the [name] COVID-19 Vaccine and the availability of other FDA-authorized or -approved COVID-19 vaccines (i.e., mRNA vaccines).    Have received dermal fillers  no FDA-authorized or -approved COVID-19 vaccines can be administered to people who have received injectable dermal fillers who have no contraindications for vaccination.  Infrequently, these people might experience temporary swelling at or near the site of filler injection (usually the face or lips) following administration of a dose of an mRNA COVID-19 vaccine. These people should be advised to contact their healthcare provider if swelling develops at or near the site of dermal filler following vaccination.     Have a history of Guillain-Barr Syndrome (GBS)  no People with a history of GBS can receive any FDA-authorized or -approved COVID-19 vaccine. However, given the possible association between the [employer] and an increased risk of GBS, a patient with a history of GBS and their clinical team should discuss the availability of mRNA vaccines to offer protection against COVID-19. The highest risk has been observed in men aged 27-64 years with symptoms of GBS beginning within 42 days after [name] COVID-19 vaccination.  People who had GBS after receiving Janssen vaccine should be made aware of the option to receive  an mRNA COVID-19 vaccine booster at least 2 months (8 weeks) after the Janssen dose. However, [name] vaccine may be used as a booster, particularly if GBS occurred more than 42  days after vaccination or was related to a non-vaccine factor. Prior to booster vaccination, a conversation between the patient and their clinical team may assist with decisions about use of a COVID-19 booster dose, including the timing of administration     Postvaccination Observation Times for People without Contraindications to Covid 19 Vaccination.  30 minutes:  People with a history of: A contraindication to another type of COVID-19 vaccine product (i.e., mRNA or viral vector COVID-19 vaccines)   Immediate (within 4 hours of exposure) non-severe allergic reaction to a COVID-19 vaccine or injectable therapies   Anaphylaxis due to any cause   Immediate allergic reaction of any severity to a non-COVID-19 vaccine   15 minutes: All other people  This patient is a 20 y.o. male that meets the FDA criteria to receive homebound vaccination. Patient or parent/caregiver understands they have the option to accept or refuse homebound vaccination.  Patient passed the pre-screening checklist and would like to proceed with homebound vaccination.  Based on questionnaire above, I recommend the patient be observed for 15 minutes.  There are an estimated #1 other household members/caregivers who are also interested in receiving the vaccine.    The patient has been confirmed homebound and eligible for homebound vaccination with the considerations outlined above. I will send the patient's information to our scheduling team who will reach out to schedule the patient and potential caregiver/family members for homebound vaccination.    Lucas [name] 04/22/2021 2:20 PM

## 2021-04-23 ENCOUNTER — Other Ambulatory Visit (INDEPENDENT_AMBULATORY_CARE_PROVIDER_SITE_OTHER): Payer: Self-pay | Admitting: Family

## 2021-04-23 ENCOUNTER — Ambulatory Visit (INDEPENDENT_AMBULATORY_CARE_PROVIDER_SITE_OTHER): Payer: Medicaid Other | Admitting: Pediatrics

## 2021-04-23 MED ORDER — PREDNISOLONE 15 MG/5ML PO SYRP: 9.0000 mg | ORAL_SOLUTION | Freq: Every day | ORAL | 3 refills | Status: DC

## 2021-04-23 NOTE — Telephone Encounter (Signed)
My chart permission received from mom to give [facility] rep her address to send formula sample. Information sent to Will at [facility].

## 2021-04-23 NOTE — Telephone Encounter (Signed)
I received a call from the pharmacy stating that Bob's recent Rx for Prednisolone had changed from the 83ml he was getting to 1.39ml. I could not find any indication in the chart for that to occur and sent in a prescription for the 4ml dose. TG

## 2021-04-26 ENCOUNTER — Encounter (INDEPENDENT_AMBULATORY_CARE_PROVIDER_SITE_OTHER): Payer: Self-pay

## 2021-04-26 ENCOUNTER — Telehealth (INDEPENDENT_AMBULATORY_CARE_PROVIDER_SITE_OTHER): Payer: Self-pay | Admitting: Pediatrics

## 2021-04-26 DIAGNOSIS — G7101 Duchenne or Becker muscular dystrophy: Secondary | ICD-10-CM

## 2021-04-26 DIAGNOSIS — Z931 Gastrostomy status: Secondary | ICD-10-CM

## 2021-04-26 NOTE — Telephone Encounter (Signed)
  Who's calling (name and relationship to patient) :mom / Traci Hoselton   Best contact [phone]  Provider they see:[name]   Reason for call:mom called requesting a call back because the patient will be out of formula soon and needed to speak with someone about possibly getting some samples. Please advise      PRESCRIPTION REFILL ONLY  Name of prescription:  Pharmacy:

## 2021-04-27 NOTE — Telephone Encounter (Signed)
Sent my chart message to mom per formula rep she will have the [facility] 1.0 peptide by 04/28/21

## 2021-04-27 NOTE — Telephone Encounter (Signed)
The patient's mother was contacted by MyChart. She will receive a shipment of [employer] formula tomorrow. TG

## 2021-04-29 ENCOUNTER — Ambulatory Visit: Payer: Medicaid Other | Attending: Critical Care Medicine

## 2021-04-29 DIAGNOSIS — Z23 Encounter for immunization: Secondary | ICD-10-CM

## 2021-04-29 NOTE — Progress Notes (Signed)
   Covid-19 Vaccination Clinic  Name:  Lucas [name]    MRN: [id number] DOB: 2001-08-01  04/29/2021  Mr. Lucas [name] was observed post Covid-19 immunization for 15 minutes without incident. He was provided with Vaccine Information Sheet and instruction to access the V-Safe system.   Mr. Lucas [name] was instructed to call 911 with any severe reactions post vaccine: Marland Kitchen Difficulty breathing  . Swelling of face and throat  . A fast heartbeat  . A bad rash all over body  . Dizziness and weakness   Immunizations Administered    Name Date Dose VIS Date Route   Moderna Covid-19 Booster Vaccine 04/29/2021 11:48 AM 0.25 mL 09/30/2020 Intramuscular   Manufacturer: Moderna   Lot: [id number]   [place]: [id number]

## 2021-05-03 NOTE — Telephone Encounter (Signed)
New order for [employer] 1.0 Peptide sent to [facility] to dispense. Patient has tolerated samples x 5 days. [name] agrees with switching formula. This formula contains 325 ml per bottle will decrease number for 24 hours from 5 to 4.

## 2021-05-13 ENCOUNTER — Telehealth (INDEPENDENT_AMBULATORY_CARE_PROVIDER_SITE_OTHER): Payer: Self-pay | Admitting: Pediatrics

## 2021-05-13 NOTE — Telephone Encounter (Signed)
  Who's calling (name and relationship to patient) :Narda Amber    Best contact [phone]   Provider they see:Rocky Link, MD   Reason for call: Please have sarah give Kennyth Lose a call to discuss mutual patient at earliest [place]  Name of prescription:  Pharmacy:

## 2021-05-14 ENCOUNTER — Telehealth (INDEPENDENT_AMBULATORY_CARE_PROVIDER_SITE_OTHER): Payer: Self-pay | Admitting: Family

## 2021-05-14 DIAGNOSIS — R143 Flatulence: Secondary | ICD-10-CM

## 2021-05-14 DIAGNOSIS — Z931 Gastrostomy status: Secondary | ICD-10-CM

## 2021-05-14 NOTE — Telephone Encounter (Signed)
  Who's calling (name and relationship to patient) : Christianne Borrow ( mom)  Best contact number: [phone]  Provider they see: Rockwell Germany  Reason for call: Olivia Mackie called today asking for a prescription for a bag for [place] to reduce his gas. The lady who drops off his food told Olivia Mackie that there is a bag that attaches to his G-Tube and it will help reduce his gas issues. Mom calling asking if that can be sent in for him      PRESCRIPTION REFILL ONLY  Name of prescription:  Pharmacy:

## 2021-05-14 NOTE — Telephone Encounter (Signed)
I sent mom a My Chart message stating that I will speak to Dr. Yehuda Savannah on Monday. I will also ask him about this bag.

## 2021-05-14 NOTE — Telephone Encounter (Signed)
RN called yesterday and discussed lack of Pediasure and fact that gas is probably related to oral intake and constipation

## 2021-05-17 ENCOUNTER — Telehealth (INDEPENDENT_AMBULATORY_CARE_PROVIDER_SITE_OTHER): Payer: Self-pay

## 2021-05-17 ENCOUNTER — Encounter (INDEPENDENT_AMBULATORY_CARE_PROVIDER_SITE_OTHER): Payer: Self-pay

## 2021-05-17 NOTE — Telephone Encounter (Signed)
Butch Penny, [facility] RN, returned call and let me know that the order for the [facility] bags need more detail, such as when to vent, how often to vent, and if the venting needs to be done before or after feeding. I relayed that I will re-fax the order in with this information. Butch Penny verbalized understanding and had no additional questions for me.

## 2021-05-17 NOTE — Telephone Encounter (Signed)
Returned call to [facility] RN. No answer, left message to return call.

## 2021-05-17 NOTE — Telephone Encounter (Signed)
Who's calling (name and relationship to patient) : Butch Penny [facility] RN)  Best contact number: [phone]  Provider they see: Dr. Yehuda Savannah  Reason for call:  Home health RN called in requesting to speak with clinic staff regarding [employer] bags. Please advise  Call ID:      PRESCRIPTION REFILL ONLY  Name of prescription:  Pharmacy:

## 2021-05-17 NOTE — Addendum Note (Signed)
Addended by: Gunnar Bulla on: 05/17/2021 11:00 AM   Modules accepted: Orders

## 2021-05-17 NOTE — Telephone Encounter (Signed)
Called to speak to mom to get more information to relay to Dr. Yehuda Savannah. Phone number is not working. Received message that the call cannot be completed. Sent My Chart message since I could not get through on the phone.

## 2021-05-18 NOTE — Telephone Encounter (Signed)
Faxed new order, that has more detail, to [place] for [employer]. Received success sheet.

## 2021-05-20 NOTE — Telephone Encounter (Signed)
Received fax from [facility] to be signed by Dr. Yehuda Savannah

## 2021-05-20 NOTE — Telephone Encounter (Signed)
Received email from Philis N[place], [place], that bags were shipped and delivered to patient.

## 2021-05-21 NOTE — Telephone Encounter (Signed)
Received signed forms from Dr. Yehuda Savannah this morning and faxed to [facility]. Received success sheet.

## 2021-05-26 ENCOUNTER — Telehealth (INDEPENDENT_AMBULATORY_CARE_PROVIDER_SITE_OTHER): Payer: Self-pay | Admitting: Family

## 2021-05-26 NOTE — Telephone Encounter (Signed)
Who's calling (name and relationship to patient) : Olivia Mackie Berrie mom   Best contact number: [phone]  Provider they see: Rockwell Germany  Reason for call: Patient is 96.6 pounds. Mom needs to know how to continue feeding.  Mom would like to hear back today so she knows what to do Call ID:      [place]  Name of prescription:  Pharmacy:

## 2021-05-26 NOTE — Telephone Encounter (Signed)
Please advise 

## 2021-05-26 NOTE — Telephone Encounter (Signed)
Lucas [name] has called back very anxious that she talk to a provider ASAP before she feeds the patient today and is able to adjust his fod

## 2021-05-27 ENCOUNTER — Encounter (INDEPENDENT_AMBULATORY_CARE_PROVIDER_SITE_OTHER): Payer: Self-pay

## 2021-05-27 NOTE — Telephone Encounter (Addendum)
Returned mom's call. Mom stated that she was told by the dietician that Lucas [name] should not weigh over 95 lbs, and he is now 96.6 lbs. Mom stated he is only on [employer], 4 cans/day. Mom stated she stopped giving Lucas [name] any food by mouth be cause of the weight gain. Mom would like to know what she should do about his feeding so he does not gain any more weight.

## 2021-05-28 NOTE — Telephone Encounter (Signed)
I have spoken to mom about the message Dr Rogers Blocker sent me, she stated she was waiting on more clarification from Dr Yehuda Savannah. I let her know that I would send this message back to [facility] so that she can give her a call

## 2021-05-28 NOTE — Telephone Encounter (Signed)
Sent mom a My Chart message with advice.

## 2021-06-14 ENCOUNTER — Encounter (INDEPENDENT_AMBULATORY_CARE_PROVIDER_SITE_OTHER): Payer: Self-pay | Admitting: Pediatric Gastroenterology

## 2021-06-18 ENCOUNTER — Ambulatory Visit (INDEPENDENT_AMBULATORY_CARE_PROVIDER_SITE_OTHER): Payer: Medicaid Other | Admitting: Pediatrics

## 2021-06-18 ENCOUNTER — Other Ambulatory Visit: Payer: Self-pay

## 2021-06-18 ENCOUNTER — Telehealth (INDEPENDENT_AMBULATORY_CARE_PROVIDER_SITE_OTHER): Payer: Self-pay

## 2021-06-18 ENCOUNTER — Encounter (INDEPENDENT_AMBULATORY_CARE_PROVIDER_SITE_OTHER): Payer: Self-pay | Admitting: Pediatrics

## 2021-06-18 VITALS — BP 94/58 | HR 100 | Resp 16 | Wt 95.4 lb

## 2021-06-18 DIAGNOSIS — G4733 Obstructive sleep apnea (adult) (pediatric): Secondary | ICD-10-CM | POA: Diagnosis not present

## 2021-06-18 DIAGNOSIS — J984 Other disorders of lung: Secondary | ICD-10-CM | POA: Diagnosis not present

## 2021-06-18 DIAGNOSIS — G7100 Muscular dystrophy, unspecified: Secondary | ICD-10-CM

## 2021-06-18 DIAGNOSIS — M4145 Neuromuscular scoliosis, thoracolumbar region: Secondary | ICD-10-CM | POA: Diagnosis not present

## 2021-06-18 DIAGNOSIS — G7101 Duchenne or Becker muscular dystrophy: Secondary | ICD-10-CM

## 2021-06-18 MED ORDER — LEVALBUTEROL HCL 1.25 MG/3ML IN NEBU: 1.2500 mg | INHALATION_SOLUTION | RESPIRATORY_TRACT | 12 refills | Status: DC | PRN

## 2021-06-18 NOTE — Progress Notes (Addendum)
Pediatric Pulmonology  Clinic Note  06/18/2021  Primary Care Physician: Kandyce Rud, MD  Reason For Visit: Pulmonary care for Duchenne Muscular Dystrophy  Assessment and Plan:  Lucas [name] is a 20 y.o. 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 Overall, he has done very well from a respiratory standpoint, and has done well recently. He uses cough assist and vest daily due to impaired cough and mucus clearance, which I recommend continuing.  I would like for him to get a sleep study, as below, to assess for nocturnal hypoventilation. Plan: - Continue cough assist and vest BID, increase to 3-4 x when sick -Change from albuterol as needed to use Xopenex as needed given significant tachycardia with albuterol.  Obstructive sleep apnea with nocturnal hypoxemia: Lucas [name] has obstructive sleep apnea related to his muscular dystrophy, and has been using CPAP for ~ 3 years now. Also with nocturnal hypoxemia requiring supplemental oxygen and pulse oximeter monitoring. Seems to be fairly well controlled, but he is due for another study for titration, as well as to assess for nocturnal hypoventilation. Plan: - Continue CPAP +11 at night  - supplemental oxygen at night as needed 1-3 L to maintain saturations >92% - Repeat sleep study - another order placed today  Healthcare Maintenance: Lucas [name] has received a flu vaccine this season.  Has received 3 COVID vaccines.   Followup: Return in about 6 months (around 12/19/2021).     Chrissie Noa "Will" Damita Lack, MD [facility] Pediatric Pulmonology [place] Office: [phone] [facility] Office [phone]   Subjective:  Lucas [name] is a 20 y.o. male who is seen for followup of pulmonary manifestations of duchenne's muscular dystrophy.     Lucas [name] was last seen by myself in clinic in November 2021. At  that time, he was doing well. We planned on repeat a sleep study for CPAP titration.   Lucas [name] mother today reports that overall he has been doing well this summer.  He has not had any significant respiratory illnesses or significant symptoms.  He does occasionally have some cough, for which she uses albuterol, but this is fairly rare.  She does say that when he uses albuterol he is very tachycardic and it takes almost a day for his heart rate to calm down.  His cough does seem to respond to that.  Otherwise they are using the cough assist and vest twice a day on a regular basis.  He has been doing well with sleep overall, he has continued to use his CPAP at night which he tolerates fairly well.  They have not scheduled a sleep study yet since it is hard for them to schedule these with transportation.  He has not been having any problems with skin breakdown around the CPAP mask or other issues with the CPAP machine.   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   Constipation    Seizure (HCC) 06/08/2015   Autism spectrum disorder with accompanying language impairment and intellectual disability, requiring very substantial support 06/17/2014   Complex care coordination 04/30/2014   Partial epilepsy with impairment of consciousness 04/30/2014  Duchenne muscular dystrophy 04/30/2014    Medications:   Current Outpatient Medications:    levalbuterol (XOPENEX) 1.25 MG/3ML nebulizer solution, Take 1.25 mg by nebulization every 4 (four) hours as needed for wheezing., Disp: 72 mL, Rfl: 12   carbamazepine (TEGRETOL) 100 MG chewable tablet, TAKE ONE AND ONE-HALF TABLETS BY MOUTH TWICE  DAILY, Disp: 93 tablet, Rfl: 5   diazepam (DIASTAT ACUDIAL) 10 MG GEL, Give 10 mg rectally after 2 minutes of seizures.  May repeat in 10 minutes (Patient not taking: Reported on 04/07/2021), Disp: 2 each, Rfl: 5   diphenhydrAMINE (BENADRYL) 12.5 MG/5ML elixir, 5 -10 ml po as needed for hives 3 times a day (Patient not taking: No sig reported), Disp: , Rfl:    Polyethylene Glycol 3350 (PEG 3350) 17 GM/SCOOP POWD, Give 1-2 capfuls of Miralax mixed in Prune juice by G tube daily, Disp: 578 g, Rfl: 5   prednisoLONE (PRELONE) 15 MG/5ML syrup, Place 3 mLs (9 mg total) into feeding tube daily., Disp: 90 mL, Rfl: 3   senna (SENOKOT) 8.6 MG TABS tablet, 1 tablet (8.6 mg total) by Per NG tube route at bedtime. 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 [employer] 21-22 school year.   He lives with mother.     Lives with mother and Yovanny in [place] [address]. Mother smokes.   Objective:  Vitals Signs: BP (!) 94/58   Pulse 100   Resp 16   Wt 95 lb 6.4 oz (43.3 kg)   SpO2 97%   BMI 19.41 kg/m  Wt Readings from Last 3 Encounters:  06/18/21 95 lb 6.4 oz (43.3 kg) (<1 %, Z= -3.83)*  03/18/21 95 lb 9.6 oz (43.4 kg) (<1 %, Z= -3.81)*  10/30/20 95 lb (43.1 kg) (<1 %, Z= -3.85)*   * Growth percentiles are based on CDC (Boys, 2-20 Years) data.   GENERAL: Appears comfortable and in no respiratory distress. Sitting in wheelchair - alert and responsive ENT:  ENT exam reveals no visible nasal polyps. No skin breakdown RESPIRATORY:  No stridor or stertor. Clear to auscultation bilaterally, normal work and rate of breathing with no retractions, no crackles or wheezes, with symmetric breath sounds throughout.  No clubbing.  CARDIOVASCULAR:  Regular rate and rhythm without murmur.    Medical Decision Making:   Recent Blood  Gases/Bicarbonates:  pH, Ven  Date Value Ref Range Status  06/09/2015 7.397 (H) 7.250 - 7.300 Final   pCO2, Ven  Date Value Ref Range Status  06/09/2015 36.7 (L) 45.0 - 50.0 mmHg Final   Bicarb:  CO2  Date Value Ref Range Status  06/16/2020 27 22 - 32 mmol/L Final   Co2  Date Value Ref Range Status  03/12/2015 27 mmol/L Final    Comment:    22-32 NOTE: New Reference Range  02/17/15    Per Larrie Kass note:  EEG showed right central diphasic sharply contoured slow-wave activity.   MRI of the brain failed to show a structural abnormality.   Autism was diagnosed at age 65 Duchenne muscular dystrophy was diagnosed at age 43. Polysomnogram October 2018 at [facility] - obstructive sleep apnea EKG 05/11/18 - normal sinus rhythm with normal PR, QRS and corrected QT interval Echocardiogram 05/11/18 - normal biventricular size and function  Wheelchair CPAP Nebulizer [place] bed Respiratory vest Cough  assist Kangaroo pump for feedings Suction

## 2021-06-18 NOTE — Telephone Encounter (Signed)
Call to [facility] spoke with Leonides Sake- ref # [id number] Obtained PA [phone] approved until 06/18/2022 Call to [place] ran through and was approved

## 2021-06-18 NOTE — Progress Notes (Signed)
Had 3 total Covid Vaccines

## 2021-06-18 NOTE — Patient Instructions (Signed)
Pediatric Pulmonology  Clinic Discharge Instructions       06/18/21    It was great to see you and Lucas [name] today! We will try using xopenex instead of albuterol if he does have cough/ asthma symptoms. We will try to get another sleep study as well - I will ask them to reach out to you to coordinate timing of this and see if it can be done during the day.   Followup: Return in about 6 months (around 12/19/2021).  Please call [phone] with any further questions or concerns.

## 2021-06-23 ENCOUNTER — Telehealth (INDEPENDENT_AMBULATORY_CARE_PROVIDER_SITE_OTHER): Payer: Self-pay | Admitting: Family

## 2021-06-23 NOTE — Telephone Encounter (Signed)
I called and talked with Mom. I told her that the weight listed today is more than when he was weighed at this office and that was ok. She asked if he should continue on his current feeding plan and I told her to do so. TG

## 2021-06-23 NOTE — Telephone Encounter (Signed)
ERROR

## 2021-06-23 NOTE — Telephone Encounter (Signed)
Who's calling (name and relationship to patient) : Pratik Dalziel mom   Best contact number: [phone]  Provider they see: Rockwell Germany  Reason for call: patient weighs 96.8 and mom wants to know if this is okay.   Call ID:      PRESCRIPTION REFILL ONLY  Name of prescription:  Pharmacy:

## 2021-06-25 ENCOUNTER — Encounter (INDEPENDENT_AMBULATORY_CARE_PROVIDER_SITE_OTHER): Payer: Self-pay

## 2021-06-28 ENCOUNTER — Ambulatory Visit (INDEPENDENT_AMBULATORY_CARE_PROVIDER_SITE_OTHER): Payer: Medicaid Other | Admitting: Pediatrics

## 2021-06-28 NOTE — Telephone Encounter (Signed)
Call to [facility] as requested by MyChart note. She reports last week Nole was very lethargic one day and she thinks it was related to the timing of his Tegratol. She would like an order to be sent to [facility] to give it between 7:30 & 8:00 AM and same at night. She reports the following day he was fine after he received the medication. RN advised will send order to [facility]. Order faxed

## 2021-07-02 ENCOUNTER — Telehealth (INDEPENDENT_AMBULATORY_CARE_PROVIDER_SITE_OTHER): Payer: Self-pay

## 2021-07-02 NOTE — Telephone Encounter (Signed)
Mom Olivia Mackie reports [facility] said the form they received about Lucas [name] said it was XR. RN advised she will pull the form but is not aware he has ever been on XR. Mom reports if there is one it might work better for him. RN will ask MD.   RN attached copy of the order and sent by his mychart to mom. Dr.Wolfe reported he cannot do the XR because it comes in a tablet and cannot be crushed.

## 2021-08-04 ENCOUNTER — Other Ambulatory Visit (INDEPENDENT_AMBULATORY_CARE_PROVIDER_SITE_OTHER): Payer: Self-pay | Admitting: Pediatrics

## 2021-08-04 DIAGNOSIS — K5900 Constipation, unspecified: Secondary | ICD-10-CM

## 2021-08-11 ENCOUNTER — Telehealth (INDEPENDENT_AMBULATORY_CARE_PROVIDER_SITE_OTHER): Payer: Self-pay | Admitting: Pediatrics

## 2021-08-11 NOTE — Telephone Encounter (Signed)
  Who's calling (name and relationship to patient) : Harold,Tracy (Mother) Best contact number: [phone] Provider they see:Dr Rogers Blocker  Reason for call: need  douc stool softener laxative  prescribed as needed  Patient is having a bit of problem moving bowels per mom.  Mom stated it was prescribed once before     [place]  Name of prescription:Douc stool softner   Pharmacy: [facility]

## 2021-08-18 MED ORDER — DOCUSATE SODIUM 150 MG/15ML PO LIQD: 100.0000 mg | Freq: Every day | ORAL | 5 refills | Status: DC

## 2021-08-18 NOTE — Telephone Encounter (Signed)
Docusate liquid was previously prescribed by Otila Kluver.  I have sent a new prescription for the same, please confirm this is what mother wants.  This is an over the counter medication, so will not be paid for by medicaid, but with a prescription they may be able to ship it to her or have it ready behind the counter.   Carylon Perches MD MPH

## 2021-08-18 NOTE — Telephone Encounter (Signed)
Spoke to mom, to confirm that the mediation is what she wanted. Mom confirms understanding of insurance not paying for medication per Dr Shelby Mattocks Note.

## 2021-08-20 ENCOUNTER — Other Ambulatory Visit (INDEPENDENT_AMBULATORY_CARE_PROVIDER_SITE_OTHER): Payer: Self-pay | Admitting: Pediatrics

## 2021-08-20 ENCOUNTER — Telehealth (INDEPENDENT_AMBULATORY_CARE_PROVIDER_SITE_OTHER): Payer: Self-pay | Admitting: Pediatrics

## 2021-08-20 DIAGNOSIS — K5900 Constipation, unspecified: Secondary | ICD-10-CM

## 2021-08-20 DIAGNOSIS — Z931 Gastrostomy status: Secondary | ICD-10-CM

## 2021-08-20 NOTE — Telephone Encounter (Signed)
Spoke with mom and let her know per [name] " I recommend continuing his medications as scheduled. Any side effects will likely go away over the next few days."  Mom states understanding and will contact our office if side effects persist.

## 2021-08-20 NOTE — Telephone Encounter (Signed)
On Wednesday an extra dose of the Tegratol was given. Patient has taken 900 mg since Wednesday, not that he is supposed to take 900 in total.  Mom informs that his heart rate was elevated today (121-131).  He has a rash across his face that popped up Wednesday after the extra dose was given. Hersel stooled with the assistance of an enema today. His heart rated dropped to 105 after stooling. The rash is starting to dissipate.

## 2021-08-20 NOTE — Telephone Encounter (Signed)
  Who's calling (name and relationship to patient) : Keifer Habib   Best contact number:  [phone] (Mobile)    Provider they see: [name]   Reason for call: Mom is calling about the Seizure meds dosage. Mom said the 3x a day 900 mg has a made him extremely constipated and he has developed a rash on his face.      PRESCRIPTION REFILL ONLY  Name of prescription:  Pharmacy:

## 2021-08-20 NOTE — Telephone Encounter (Signed)
Patient is not on a 3 times daily $RemoveBef'900mg'[id number]$  medication.  His seizure medication is $RemoveBefor'100mg'FilNdJjZRbhY$  1.5 tablets twice daily.  Please call to clarify what medication she is concerned for.  I can not find that any medications have been recently changed.    If there has not been a change in medications, I recommend seeing Dr Baldemar Lenis about constipation while we are awaiting adult GI. We had given Dr Danielle Dess CMA the information to refer to adult GI, but I see the referral has gone in.  I have put in the referral and Judson Roch is calling the GI clinic to rush the referral.     Carylon Perches MD MPH

## 2021-08-20 NOTE — Telephone Encounter (Signed)
Reviewed with York Cerise, patient had 1 extra dose on Wednesday.  Otherwise has been as scheduled.  I recommend continuing his medications as scheduled. Any side effects will likely go away over the next few days.    Carylon Perches MD MPH

## 2021-08-23 ENCOUNTER — Encounter (INDEPENDENT_AMBULATORY_CARE_PROVIDER_SITE_OTHER): Payer: Self-pay

## 2021-08-24 ENCOUNTER — Telehealth (INDEPENDENT_AMBULATORY_CARE_PROVIDER_SITE_OTHER): Payer: Self-pay

## 2021-08-24 NOTE — Telephone Encounter (Signed)
Web-ex meeting with current CAP- C case manager Lurline Idol C-FNP, Amy Luana Shu and Marlowe Kays  Patient is aging out of CAP-C next July and unsure what the best option is for him. Currently he receives PDN and with it he is not eligible for CAP-DA or Innovations Waiver that would allow for ongoing case management with financial resources etc. CAP-DA would change from nursing level of care to CNA level of care. Innovations Waiver would be [employer] care but they can perform tasks that a CNA cannot perform.  RN found [facility] online and emailed Dalene Carrow to determine what case management services they offer. You may contact any ACCSA staff through the following: [phone] Executive Director, Babs Sciara    [email or web address] Finance Officer, Cruz Condon [email or web address] CSBG Director, Cindee Lame   [email or web address]  Nutrition Director, Durenda Hurt   [email or web address]  Case Manager, Dalene Carrow [email or web address]  Case Manager, Sapna Narasimhamurthy [email or web address]  RN also emailed an Innovations Waiver Case Manager that works with other PC-3 patients to ask how to determine if he is on the [employer].  Annitta Needs)

## 2021-09-02 ENCOUNTER — Other Ambulatory Visit (INDEPENDENT_AMBULATORY_CARE_PROVIDER_SITE_OTHER): Payer: Self-pay | Admitting: Family

## 2021-09-08 IMAGING — DX DG CHEST 1V PORT
1 series · 1 of 1 positions shown · non-contrast
Comparison: April 23, 2017

CLINICAL DATA: Status post trauma.

EXAM:
PORTABLE CHEST 1 VIEW

[chest ap]
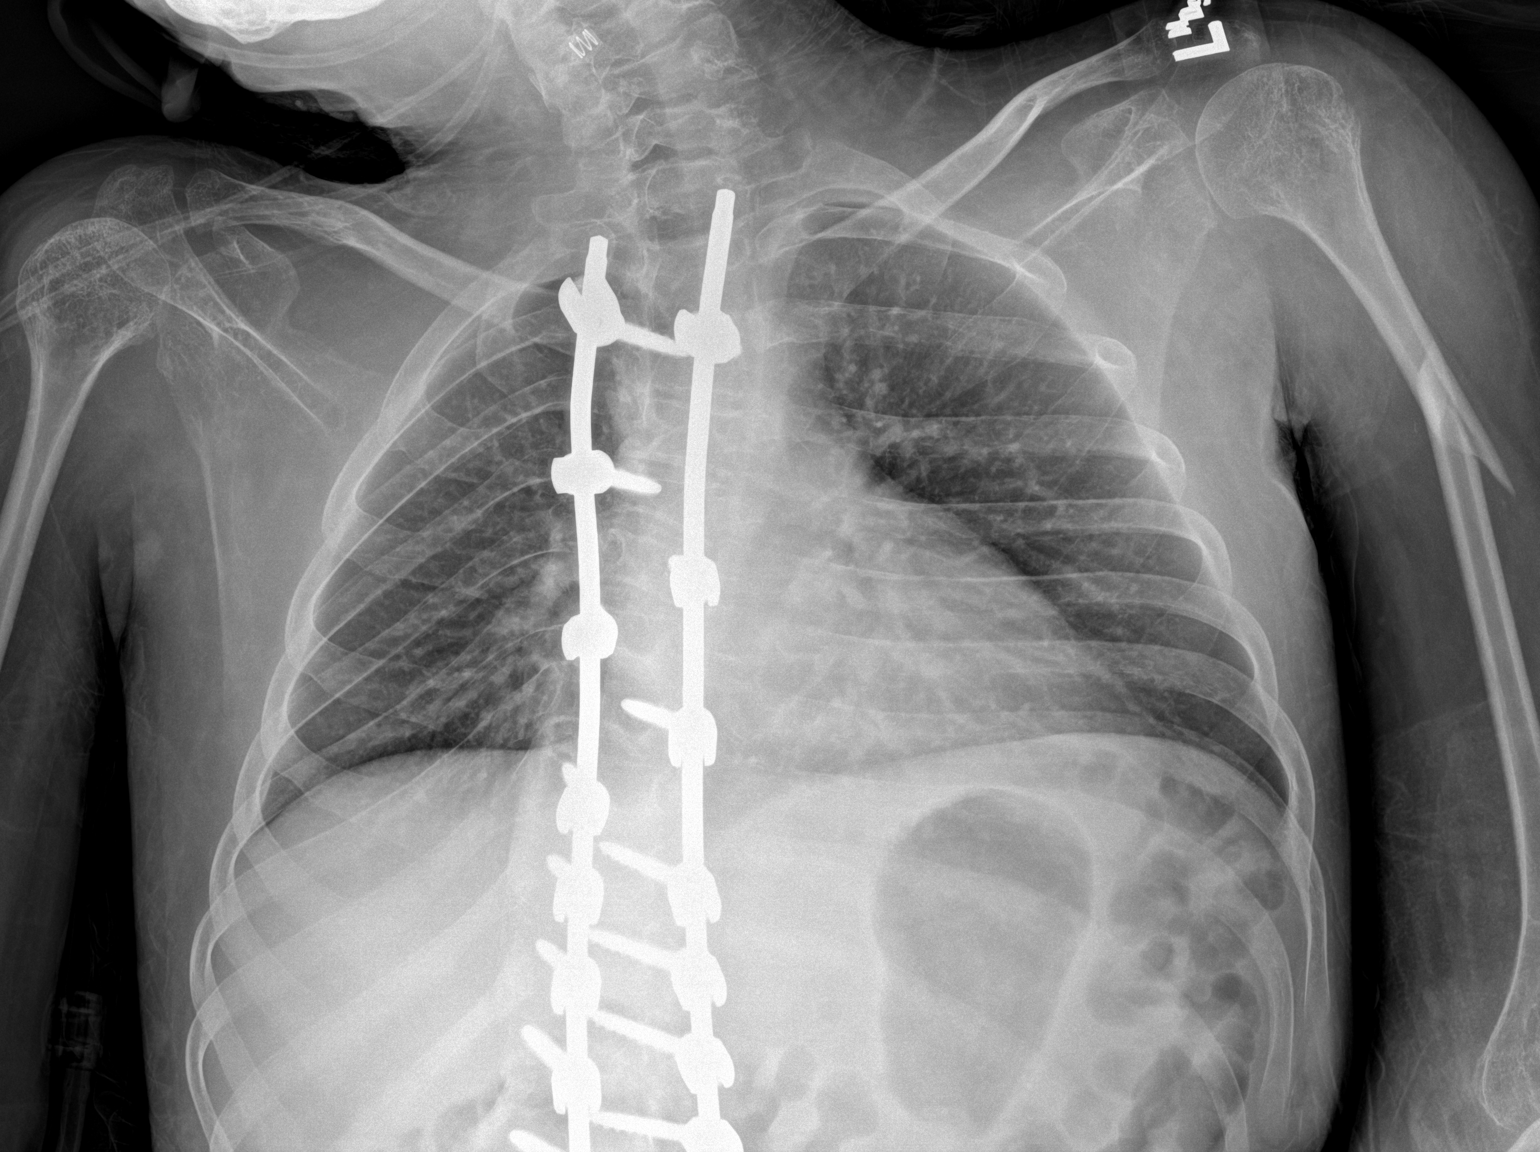

[1 of 1 positions shown; findings below may reference images not displayed]

FINDINGS: There is no evidence of acute infiltrate, pleural effusion or
pneumothorax. The heart size and mediastinal contours are within
normal limits. Acute fracture deformity is seen involving the shaft
of the proximal left humerus. Bilateral radiopaque pedicle screws
and stabilization rods are seen along the length of the thoracic
spine.
IMPRESSION: 1. No acute or active cardiopulmonary disease.
2. Acute fracture of the proximal left humerus.
3. Extensive postoperative changes throughout the thoracic spine.

## 2021-09-08 IMAGING — DX DG HUMERUS 2V *L*
2 series · 2 of 2 positions shown · non-contrast
Comparison: None.

CLINICAL DATA: Status post trauma.

EXAM:
LEFT HUMERUS - 2+ VIEW

[humerus ap]
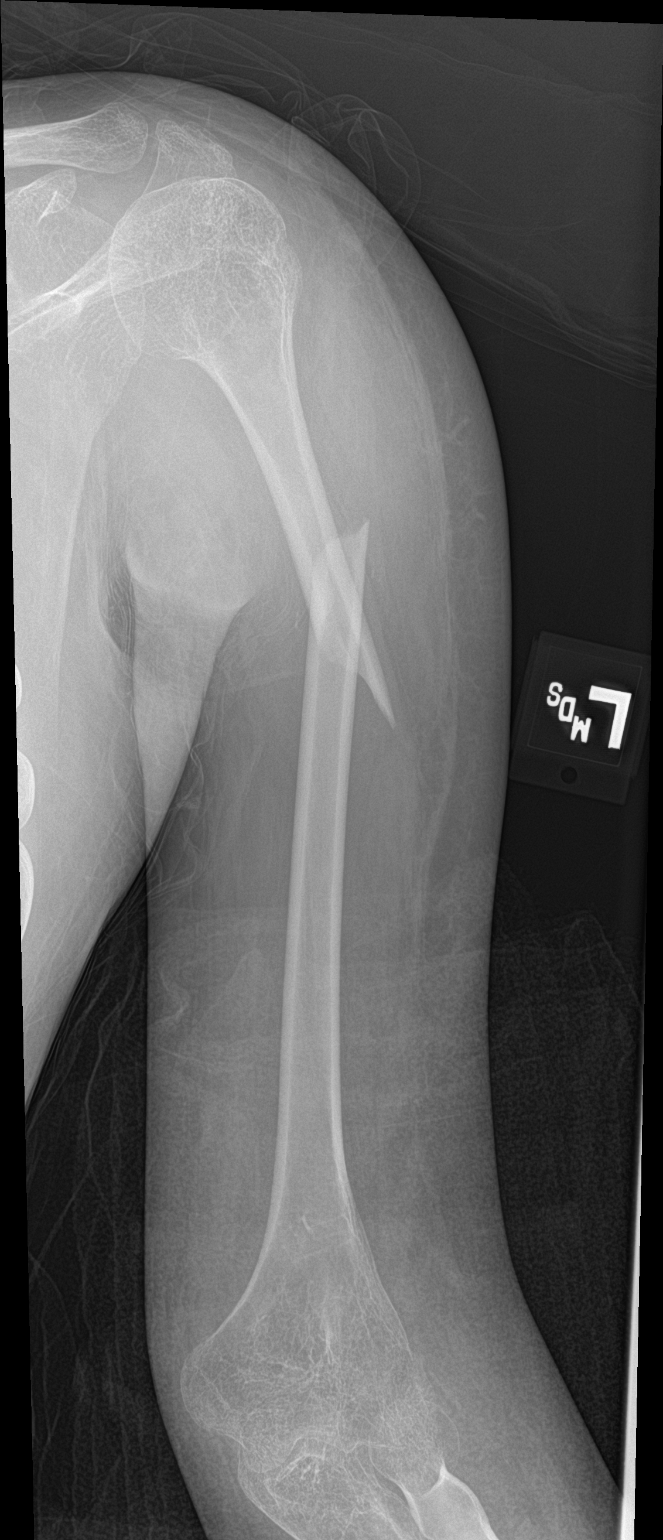

[humerus lat]
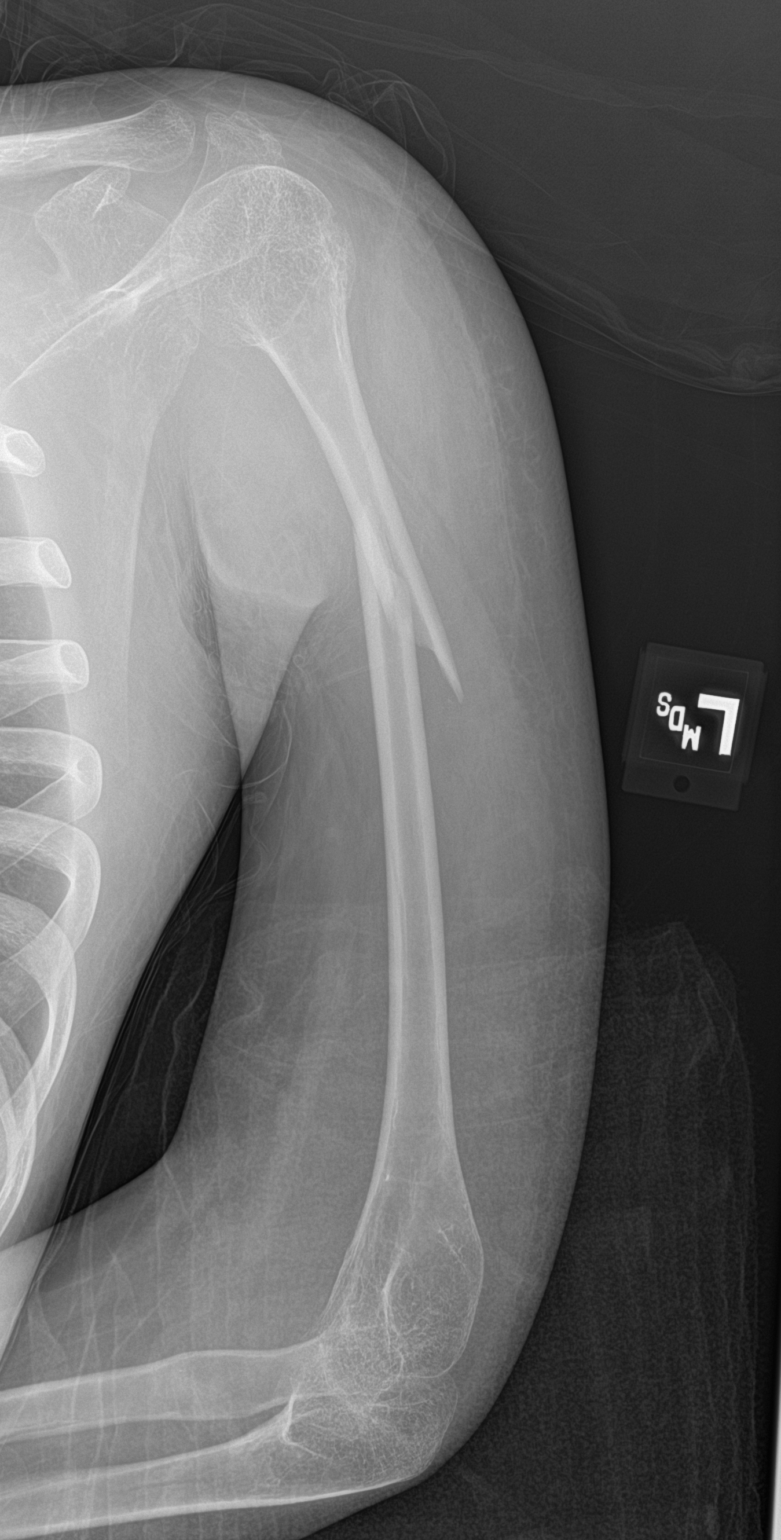

[2 of 2 positions shown; findings below may reference images not displayed]

FINDINGS: Acute fracture deformity is seen involving the shaft of the proximal
left humerus. There is lateral angulation of the fracture apex with
mild medial angulation of the distal fracture site. There is no
evidence of dislocation. Soft tissues are unremarkable.
IMPRESSION: Acute fracture of the proximal left humerus.

## 2021-09-09 ENCOUNTER — Encounter (INDEPENDENT_AMBULATORY_CARE_PROVIDER_SITE_OTHER): Payer: Self-pay

## 2021-09-23 ENCOUNTER — Other Ambulatory Visit (INDEPENDENT_AMBULATORY_CARE_PROVIDER_SITE_OTHER): Payer: Self-pay | Admitting: Pediatrics

## 2021-09-23 DIAGNOSIS — K5900 Constipation, unspecified: Secondary | ICD-10-CM

## 2021-09-24 ENCOUNTER — Telehealth (INDEPENDENT_AMBULATORY_CARE_PROVIDER_SITE_OTHER): Payer: Self-pay | Admitting: Family

## 2021-09-24 ENCOUNTER — Encounter (INDEPENDENT_AMBULATORY_CARE_PROVIDER_SITE_OTHER): Payer: Self-pay

## 2021-09-24 ENCOUNTER — Telehealth (INDEPENDENT_AMBULATORY_CARE_PROVIDER_SITE_OTHER): Payer: Self-pay | Admitting: Pediatrics

## 2021-09-24 ENCOUNTER — Other Ambulatory Visit (INDEPENDENT_AMBULATORY_CARE_PROVIDER_SITE_OTHER): Payer: Self-pay | Admitting: Pediatrics

## 2021-09-24 DIAGNOSIS — G40209 Localization-related (focal) (partial) symptomatic epilepsy and epileptic syndromes with complex partial seizures, not intractable, without status epilepticus: Secondary | ICD-10-CM

## 2021-09-24 NOTE — Telephone Encounter (Signed)
Refill sent. MyChart message sent letting mom know.

## 2021-09-24 NOTE — Telephone Encounter (Signed)
  Who's calling (name and relationship to patient) : [name]  Best contact number:  [phone] [facility]    Provider they see:[name]  Reason for call: Mom is asking [name] [facility] [place] [facility] Carb[facility]epine     PRESCRIPTION REFILL ONLY  Name of prescription:Carbamazepine  [facility] [place]

## 2021-09-24 NOTE — Telephone Encounter (Signed)
Discussed with Dr. [place] Cellar. Follow up appt sched for 11/21 to discuss to satisfy Face 2 Face requirement. He will place an order in [facility] system for sleep study on CPAP. Sent mom a my chart message with the information and RN will contact [facility] to determine what the order needs to include

## 2021-09-24 NOTE — Telephone Encounter (Signed)
  Who's calling (name and relationship to patient) : Mom   Best contact number:[phone] (Mobile  Provider they [facility]   Reason for call:Mother wants Otila Kluver and Dr Rogers Blocker to know she spoke with [employer] and said that [place] will be qualified for cpack in November and advised by [facility] to get prescription in.     PRESCRIPTION REFILL ONLY  Name of prescription:  Pharmacy:

## 2021-09-27 ENCOUNTER — Telehealth (INDEPENDENT_AMBULATORY_CARE_PROVIDER_SITE_OTHER): Payer: Self-pay

## 2021-09-27 NOTE — Telephone Encounter (Signed)
Call to [employer] to follow up on a message from mom about needing an order for a new CPAP machine in Nov. Eliezer Lofts is unable to see any documentation related to a CPAP machine. They did not have the correct number for mom so she updated it in the system. RN will follow up with mom to determine if she remembers who specifically told her about the machine.

## 2021-09-30 ENCOUNTER — Encounter (INDEPENDENT_AMBULATORY_CARE_PROVIDER_SITE_OTHER): Payer: Self-pay | Admitting: Pediatrics

## 2021-10-04 ENCOUNTER — Encounter (INDEPENDENT_AMBULATORY_CARE_PROVIDER_SITE_OTHER): Payer: Self-pay

## 2021-10-08 ENCOUNTER — Telehealth (INDEPENDENT_AMBULATORY_CARE_PROVIDER_SITE_OTHER): Payer: Self-pay

## 2021-10-08 DIAGNOSIS — G4733 Obstructive sleep apnea (adult) (pediatric): Secondary | ICD-10-CM

## 2021-10-08 DIAGNOSIS — G7100 Muscular dystrophy, unspecified: Secondary | ICD-10-CM

## 2021-10-08 DIAGNOSIS — M4145 Neuromuscular scoliosis, thoracolumbar region: Secondary | ICD-10-CM

## 2021-10-08 NOTE — Telephone Encounter (Signed)
Call to [facility] spoke with Marzetta Board- yes they can do the study- mom is to bring all of his feeding, med, and incontinence supplies and to stay with him to perform all of his care. The only test related equipment she needs to bring is his CPAP mask no the machine.   Fax order to  [phone]

## 2021-10-15 NOTE — Telephone Encounter (Signed)
Order faxed to [phone] through [facility]

## 2021-10-19 ENCOUNTER — Ambulatory Visit (INDEPENDENT_AMBULATORY_CARE_PROVIDER_SITE_OTHER): Payer: Medicaid Other | Admitting: Gastroenterology

## 2021-10-19 ENCOUNTER — Encounter: Payer: Self-pay | Admitting: Gastroenterology

## 2021-10-19 VITALS — BP 106/69 | HR 116 | Temp 97.6°F

## 2021-10-19 DIAGNOSIS — K5904 Chronic idiopathic constipation: Secondary | ICD-10-CM

## 2021-10-19 DIAGNOSIS — Z931 Gastrostomy status: Secondary | ICD-10-CM

## 2021-10-19 NOTE — Patient Instructions (Signed)
Stop senna and docusate  Apply barrier cream as needed in affected areas

## 2021-10-19 NOTE — Progress Notes (Signed)
Arlyss Repress, MD [address]  [address]  [place], [place] [address]  Main: [phone]  Fax: [phone]    Gastroenterology Consultation  Referring Provider:     Margurite Auerbach, MD Primary Care Physician:  Kandyce Rud, MD Primary Gastroenterologist:  Dr. Arlyss Repress Reason for Consultation:    Chronic idiopathic constipation, gastrostomy tube dependent        HPI:   Lucas [name] is a 20 y.o. male referred by Dr. Kandyce Rud, MD  for consultation & management of constipation and gastrostomy tube.  Patient has history of autism, muscular dystrophy, s/p gastrostomy tube feeds.  Patient is accompanied by his nurse and by his mom today.  He has been doing well on gastrostomy tube feeds, weight has been maintained.  Patient is on MiraLAX daily that keeps his bowels regular.  Patient's mom reports that he developed rash with senna and docusate.  Patient's mom did not have any GI concerns today.  Most recent labs revealed no evidence of anemia  NSAIDs: None  Antiplts/Anticoagulants/Anti thrombotics: None  GI Procedures: None  Past Medical History:  Diagnosis Date   Autism    Autism    Diarrhea 06/09/2015   Family history of adverse reaction to anesthesia    MGGM- N/V   Muscular dystrophy (HCC)    Scoliosis    Seizures (HCC)    Last one 2013   Transient alteration of awareness    Viral gastroenteritis    Yeast infection of the skin 10/24/2020    Past Surgical History:  Procedure Laterality Date   CIRCUMCISION     at birth   LAPAROSCOPIC GASTROSTOMY N/A 05/24/2017   Procedure: LAPAROSCOPIC GASTROSTOMY TUBE PLACEMENT;  Surgeon: Kandice Hams, MD;  Location: [name];  Service: General;  Laterality: N/A;   SPINE SURGERY N/A    Phreesia 06/29/2020    Current Outpatient Medications:    carbamazepine (TEGRETOL) 100 MG chewable tablet, TAKE ONE AND ONE-HALF TALBETS BY MOUTH TWICE DAILY., Disp: 93 tablet, Rfl: 0   GOODSENSE CLEARLAX 17 GM/SCOOP  powder, GIVE 17 GM BY TUBE EVERY DAY, Disp: 476 g, Rfl: 0   levalbuterol (XOPENEX) 1.25 MG/3ML nebulizer solution, Take 1.25 mg by nebulization every 4 (four) hours as needed for wheezing., Disp: 72 mL, Rfl: 12   prednisoLONE (ORAPRED) 15 MG/5ML solution, PLACE 3 ML INTO FEEDING TUBE DAILY, Disp: 90 mL, Rfl: 1   diazepam (DIASTAT ACUDIAL) 10 MG GEL, Give 10 mg rectally after 2 minutes of seizures.  May repeat in 10 minutes (Patient not taking: No sig reported), Disp: 2 each, Rfl: 5   diphenhydrAMINE (BENADRYL) 12.5 MG/5ML elixir, 5 -10 ml po as needed for hives 3 times a day (Patient not taking: No sig reported), Disp: , Rfl:    Docusate Sodium (DOCU LIQUID) 150 MG/15ML syrup, Take 10 mLs (100 mg total) by mouth daily. (Patient not taking: Reported on 10/19/2021), Disp: 300 mL, Rfl: 5   senna (SENOKOT) 8.6 MG TABS tablet, 1 tablet (8.6 mg total) by Per NG tube route at bedtime. (Patient not taking: No sig reported), Disp: 30 tablet, Rfl: 0    Family History  Problem Relation Age of Onset   Cancer Paternal Grandfather        died at age 35   Dementia Paternal Grandmother        died at age 63   Other Mother        in special education, a carrier for Duchenne Muscular  Dystrophy   Hyperlipidemia Mother    Learning disabilities Mother    Other Maternal Uncle        Duchenne Muscular Dystrophy, was in special educations   Other Maternal Aunt        was in special education   Other Maternal Grandmother        a carrier for Duchenne Muscular Dystrophy/Died due to septic shock at 20 years old    Heart failure Maternal Grandmother    Hypertension Maternal Grandmother    Alcohol abuse Father    Asthma Other    Hyperlipidemia Other    Hypertension Other    Vision loss Other      Social History   Tobacco Use   Smoking status: Never    Passive exposure: Yes   Smokeless tobacco: Never  Vaping Use   Vaping Use: Never used  Substance Use Topics   Alcohol use: No   Drug use: No     Allergies as of 10/19/2021 - Review Complete 10/19/2021  Allergen Reaction Noted   Penicillins Hives and Rash 04/30/2014   Docusate sodium  10/19/2021   Multivitamins  04/07/2021   Other Other (See Comments) 10/19/2021   Calcitonin Rash 12/23/2015   Esomeprazole magnesium Nausea And Vomiting 03/14/2019   Omeprazole Other (See Comments) 03/06/2019   Simethicone Palpitations and Rash 03/18/2021   Sunflower oil Itching, Rash, and Other (See Comments) 02/22/2021   Vitamin d analogs Other (See Comments) 02/2021    Review of Systems:    All systems reviewed and negative except where noted in HPI.   Physical Exam:  BP 106/69 (BP Location: Left Arm, Patient Position: Sitting, Cuff Size: Normal)   Pulse (!) 116   Temp 97.6 F (36.4 C) (Other (Comment)) Comment (Src): under arm No LMP for male patient.  General:   Alert,  Well-developed, well-nourished, pleasant NAD Head:  Normocephalic and atraumatic. Eyes:  Sclera clear, no icterus.   Conjunctiva pink. Ears:  Normal auditory acuity. Nose:  No deformity, discharge, or lesions. Mouth:  No deformity or lesions,oropharynx pink & moist. Neck:  Supple; no masses or thyromegaly. Lungs:  Respirations even and unlabored.  Clear throughout to auscultation.   No wheezes, crackles, or rhonchi. No acute distress. Heart:  Regular rate and rhythm; no murmurs, clicks, rubs, or gallops. Abdomen:  Normal bowel sounds. Soft, gastrostomy T-piece in place, area appeared normal, and non-tender and non-distended without masses, hepatosplenomegaly or hernias noted.  No guarding or rebound tenderness.   Rectal: Not performed Msk: Muscle wasting of the extremities with contractures Pulses:  Normal pulses noted. Extremities:  No clubbing or edema.  No cyanosis. Neurologic:  Alert and oriented x0; Skin:  Intact without significant lesions or rashes. No jaundice.  Imaging Studies: None  Assessment and Plan:   Lucas [name] is a 20 y.o. male with  autism, muscular dystrophy s/p gastrostomy tube dependence, chronic idiopathic constipation.  Chronic idiopathic constipation Patient has regular bowel movements on MiraLAX.  No active GI issues today   Follow up as needed   Cephas Darby, MD

## 2021-10-28 ENCOUNTER — Telehealth (INDEPENDENT_AMBULATORY_CARE_PROVIDER_SITE_OTHER): Payer: Self-pay | Admitting: Family

## 2021-10-28 ENCOUNTER — Encounter (INDEPENDENT_AMBULATORY_CARE_PROVIDER_SITE_OTHER): Payer: Self-pay | Admitting: Pediatrics

## 2021-10-28 ENCOUNTER — Telehealth (INDEPENDENT_AMBULATORY_CARE_PROVIDER_SITE_OTHER): Payer: Self-pay

## 2021-10-28 ENCOUNTER — Telehealth (INDEPENDENT_AMBULATORY_CARE_PROVIDER_SITE_OTHER): Payer: Self-pay | Admitting: Pediatrics

## 2021-10-28 NOTE — Telephone Encounter (Signed)
I called and left a message for [facility] requesting a call back. TG

## 2021-10-28 NOTE — Telephone Encounter (Signed)
  Who's calling (name and relationship to patient) : Pettis,Tracy Best contact number: [phone] Provider they see: Rockwell Germany Reason for call: Please have tina contact patient to discuss prescribing a cream for patient. Patient last seen 10/28/20    PRESCRIPTION REFILL ONLY  Name of prescription:  Pharmacy:

## 2021-10-28 NOTE — Telephone Encounter (Signed)
Call to [facility] to determine if sleep study has been scheduled- She reports they have not been able to reach mom. RN advised patient is being seen tomorrow if they cannot reach her they can let RN know date and she will give mom information. Stacy reports RN can give mom her number and she can call and schedule it. [phone] option 1  The did receive the order.

## 2021-10-28 NOTE — Telephone Encounter (Signed)
Call to mom Olivia Mackie- she reports he has a red place on his inner thigh and is requesting yeast cream and powder. RN advised would need to see the rash to determine if it is yeast. Mom has an appointment with Dr. [place] Cellar in the morning she will take a picture of the area and show it to RN tomorrow. RN will upload it to his my chart and show Otila Kluver.

## 2021-10-28 NOTE — Telephone Encounter (Signed)
  Who's calling (name and relationship to patient) : Jake Chartered certified accountant)   Best contact number:   Provider they GYK:ZLDJ   Reason for call:need script for desitine and neospatin. Patient has a rash and their supply is low      PRESCRIPTION REFILL ONLY  Name of prescription:  [facility]:  [facility]  [address] [place]

## 2021-10-29 ENCOUNTER — Other Ambulatory Visit: Payer: Self-pay

## 2021-10-29 ENCOUNTER — Ambulatory Visit (INDEPENDENT_AMBULATORY_CARE_PROVIDER_SITE_OTHER): Payer: Medicaid Other | Admitting: Pediatrics

## 2021-10-29 ENCOUNTER — Encounter (INDEPENDENT_AMBULATORY_CARE_PROVIDER_SITE_OTHER): Payer: Self-pay | Admitting: Pediatrics

## 2021-10-29 VITALS — BP 108/60 | HR 88 | Resp 20 | Wt 95.6 lb

## 2021-10-29 DIAGNOSIS — G4733 Obstructive sleep apnea (adult) (pediatric): Secondary | ICD-10-CM | POA: Diagnosis not present

## 2021-10-29 DIAGNOSIS — J984 Other disorders of lung: Secondary | ICD-10-CM

## 2021-10-29 DIAGNOSIS — G7101 Duchenne or Becker muscular dystrophy: Secondary | ICD-10-CM

## 2021-10-29 DIAGNOSIS — R0689 Other abnormalities of breathing: Secondary | ICD-10-CM | POA: Insufficient documentation

## 2021-10-29 DIAGNOSIS — G7100 Muscular dystrophy, unspecified: Secondary | ICD-10-CM

## 2021-10-29 DIAGNOSIS — M4145 Neuromuscular scoliosis, thoracolumbar region: Secondary | ICD-10-CM | POA: Diagnosis not present

## 2021-10-29 MED ORDER — NYSTATIN 100000 UNIT/GM EX POWD: 1.0000 "application " | Freq: Three times a day (TID) | CUTANEOUS | 1 refills | Status: DC

## 2021-10-29 NOTE — Patient Instructions (Addendum)
Pediatric Pulmonology  Clinic Discharge Instructions       10/29/21    It was great to see you both and Lucas [name] today. We will work on getting a sleep study to start him on Bipap to make sure he is getting large enough breaths at night. You can call [phone] to schedule this. Otherwise, we will continue his current plan.    Followup: Return in about 6 months (around 04/28/2022).  Please call [phone] with any further questions or concerns.

## 2021-10-29 NOTE — Telephone Encounter (Signed)
Discussed with Judson Roch. Nystatin powder has been ordered. TG

## 2021-10-29 NOTE — Telephone Encounter (Signed)
Red shiny area left groin x 1 wk per his Magazine features editor. Advised to try to keep a gauze or something in the area to prevent it from staying moist. It appears to be yeast and mom reports the Nystatin powder has helped with it previously. Message to Rockwell Germany NP and med ordered.

## 2021-10-29 NOTE — Progress Notes (Signed)
Number for the sleep lab Marzetta Board  [phone] option 1 given to mom. Copy of the Nystatin powder order given to the nurse with him. Advised mom Dr. [place] Cellar is changing the order for Bi-Pap sleep study titration and we will resend the order to them. Mom is to call and schedule the appointment. Appt with [name] scheduled for 12/8 at 3 pm

## 2021-10-29 NOTE — Progress Notes (Signed)
Pediatric Pulmonology  Clinic Note  10/29/2021  Primary Care Physician: [name], MD  Reason For Visit: Pulmonary care for Duchenne Muscular Dystrophy  Assessment and Plan:  Lucas [name] is a 20 y.o. 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 Overall, he has done very well from a respiratory standpoint, and has done well recently. He uses cough assist and vest daily due to impaired cough and mucus clearance, which I recommend continuing.  He does have evidence of hypoventilation - given a recent bicarb of 30 - which is increased from prior. I therefore would like to transition to BiPAP at night in place of CPAP. Plan: - Continue cough assist and vest BID, increase to 3-4 x when sick - Continue Xopenex - Obtain polysomnography for bipap initiation/ titration  Obstructive sleep apnea with nocturnal hypoxemia: Lucas [name] has obstructive sleep apnea related to his muscular dystrophy, and has been using CPAP for ~ 3 years now. Overall stable, though I do want to transition to bipap to treat likely hypoventilation in addition to obstructive sleep apnea as above.  Plan: - Continue CPAP +11 at night  - supplemental oxygen at night as needed 1-3 L to maintain saturations >92% - Repeat sleep study - another order placed today  Healthcare Maintenance: Lucas [name] has received a flu vaccine this season.  Has received 3 COVID vaccines.   Followup: Return in about 6 months (around 04/28/2022).     Lucas Saxon "Will" Magnolia Cellar, MD [facility] Pediatric Pulmonology [place] Office: [phone] [facility] Office [phone] Subjective:  Lucas [name] is a 20 y.o. male who is seen for followup of pulmonary manifestations of duchenne's muscular dystrophy.     Lucas [name] was last seen by myself in clinic on 10/28/2021. At that time, he was doing fairly well.  I ordered a sleep study to evaluate for nocturnal hypoventilation.  Today, Lucas [name]'s mother reports that he has been doing well from a respiratory standpoint recently. No major changes in respiratory symptoms, no significant respiratory illnesses. Using vest and cough assist without any issues. Changing to xopenex form albuterol has worked well. Using this only occasionally.   Continues to use CPAP at night - though they are having issues with the machine. Not using supplemental oxygen at night. Want to go through with sleep study.   Past Medical History:   Patient Active Problem List   Diagnosis Date Noted  . Feeding by G-tube ([place]) 03/18/2019  . 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 Urinary retention 11/01/2018  . Obstructive sleep apnea syndrome 10/03/2017  . Failure to thrive (0-17) 05/24/2017  . 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 Right knee injury 06/30/2016  . Constipation   . Contractures involving both knees 07/21/2015  . Seizure ([place]) 06/08/2015  . Osteoporosis 10/30/2014  . Autism spectrum disorder with accompanying language impairment and intellectual disability, requiring very substantial support 06/17/2014  . Complex care coordination 04/30/2014  . Partial epilepsy with impairment of consciousness 04/30/2014  . Duchenne muscular dystrophy 04/30/2014    Medications:   Current Outpatient Medications:  .  carbamazepine (TEGRETOL) 100 MG chewable tablet, TAKE ONE AND ONE-HALF TALBETS BY MOUTH TWICE DAILY., Disp: 93 tablet, Rfl: 0 .  GOODSENSE CLEARLAX 17 GM/SCOOP powder, GIVE 17 GM BY TUBE EVERY DAY, Disp: 476 g, Rfl: 0 .  levalbuterol (XOPENEX) 1.25 MG/3ML nebulizer solution, Take 1.25 mg by nebulization every 4 (  four) hours as needed for wheezing., Disp: 72 mL, Rfl: 12 .  prednisoLONE (ORAPRED) 15 MG/5ML solution, PLACE 3 ML INTO FEEDING TUBE DAILY, Disp: 90 mL, Rfl: 1 .  diphenhydrAMINE (BENADRYL) 12.5 MG/5ML  elixir, 5 -10 ml po as needed for hives 3 times a day (Patient not taking: No sig reported), Disp: , Rfl:  .  nystatin (MYCOSTATIN/NYSTOP) powder, Apply 1 application topically 3 (three) times daily. 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 [employer] 21-22 school year.   He lives with mother.     Lives with mother and Lucas [name] in [place] [address]. Mother smokes.   Objective:  Vitals Signs: BP 108/60   Pulse 88   Resp 20   Wt 95 lb 9.6 oz (43.4 kg)   SpO2 99%   BMI 19.45 kg/m  Wt Readings from Last 3 Encounters:  10/29/21 95 lb 9.6 oz (43.4 kg)  06/18/21 95 lb 6.4 oz (43.3 kg) (<1 %, Z= -3.83)*  03/18/21 95 lb 9.6 oz (43.4 kg) (<1 %, Z= -3.81)*   * Growth percentiles are based on CDC (Boys, 2-20 Years) data.   GENERAL: Appears comfortable and in no respiratory distress. Sitting in wheelchair - alert and responsive ENT:  ENT exam reveals no visible nasal polyps. No skin breakdown RESPIRATORY:  No stridor or stertor. Clear to auscultation bilaterally, normal work and rate of breathing with no retractions, no crackles or wheezes, with symmetric breath sounds throughout.  No clubbing.  CARDIOVASCULAR:  Regular rate and rhythm without murmur.    Medical Decision Making:   Recent Blood Gases/Bicarbonates:  pH, Ven  Date Value Ref Range Status  06/09/2015 7.397 (H) 7.250 - 7.300 Final   pCO2, Ven  Date Value Ref Range Status  06/09/2015 36.7 (L) 45.0 - 50.0 mmHg Final   Bicarb:   Bicarb from 10/15/2021: 31 CO2  Date Value Ref Range Status  06/16/2020 27 22 - 32 mmol/L Final   Co2  Date Value Ref Range Status  03/12/2015 27 mmol/L Final    Comment:    22-32 NOTE: New Reference Range  02/17/15    Per Otila Kluver Goodpasture's note:  EEG showed right central diphasic sharply contoured  slow-wave activity.   MRI of the brain failed to show a structural abnormality.   Autism was diagnosed at age 30 Duchenne muscular dystrophy was diagnosed at age 42. Polysomnogram October 2018 at [facility] - obstructive sleep apnea EKG 05/11/18 - normal sinus rhythm with normal PR, QRS and corrected QT interval Echocardiogram 05/11/18 - normal biventricular size and function

## 2021-10-29 NOTE — Telephone Encounter (Signed)
See other phone call from yesterday. This has been managed. TG

## 2021-11-01 ENCOUNTER — Ambulatory Visit (INDEPENDENT_AMBULATORY_CARE_PROVIDER_SITE_OTHER): Payer: Medicaid Other | Admitting: Pediatrics

## 2021-11-02 ENCOUNTER — Telehealth (INDEPENDENT_AMBULATORY_CARE_PROVIDER_SITE_OTHER): Payer: Self-pay | Admitting: Family

## 2021-11-02 DIAGNOSIS — K130 Diseases of lips: Secondary | ICD-10-CM

## 2021-11-02 NOTE — Telephone Encounter (Signed)
Order faxed to [facility] through [employer]

## 2021-11-02 NOTE — Telephone Encounter (Signed)
  Who's calling (name and relationship to patient) : Olivia Mackie - mom  Best contact number: [phone]  Provider they see: Otila Kluver   Reason for call: Mom states that patient's lips are very dry and chapped and she is wondering if we can place an order for sticks with a sponge to moisten his lips. She requests call back from Blair Heys, [place]  Name of prescription:  Pharmacy:

## 2021-11-02 NOTE — Telephone Encounter (Signed)
Roena Mal[place] Medicaid will not cover the toothette swabs so we cannot order from a DME but she can purchase them and use them to moisten his lips. Advised running a cool mist humidifier due the heat running more the air in the home is probably much dryer and that will cause them to chap. Advised to apply aquaphor, chap stick or similar to keep them moist. Use a wet cloth to moisten them as needed.  She requests an  order be sent to [facility] for them to use the swabs as needed.  Order written will fax after signed.

## 2021-11-10 NOTE — Progress Notes (Signed)
Patient: Lucas [name] MRN: [id number] Sex: male DOB: [birth year]  Provider: Carylon Perches, MD Location of Care: Pediatric Specialist- Pediatric Complex Care Note type: Routine return visit  History of Present Illness: Referral Source: Turner Daniels, MD  History from: patient and prior records Chief Complaint: Pediatric Complex Care   Lucas [name] is a 20 y.o. male with history of  Duchenne Muscular Dystrophy, autism spectrum disorder, level 3, restrictive lung disease, compensated congestive heart failure, severe dysphagia requiring tube feeding, obstructive sleep apnea and a recent pathologic fracture of the proximal one third left humerus who I am seeing in follow-up for complex care management. Patient was last seen 03/18/21 where I increased prednisone and continued Carbamezapine.  Since that appointment, patient has reached out about a rash and other dry skin concerns.   Patient presents today with mother They report their largest concern is a rash he has been getting on his hip.  Symptom management:  No seizures since the last visit. Giving him BIPAP at night. No vomiting with that. Giving 3 mL prednisolone OID. Got blood work in November, and all looked good.   Mom is concerned about a rash he has been having on his hip. Was a wound, would like more nystatin to address this.   He has been getting Dillard Essex pediatric peptide 1.0, 4 bottles a day. He will eat a little of the pouches every day. Gives him Clearalax one cap a day. Stools once or more times a day. They use Farrell bags PRN.   Care coordination (other providers): Recommended Dr. Lorelle Gibbs address his bowel regimen. Saw adult GI at [facility], Dr. Marius Ditch, for G-tube management, who recommended follow up in a year.   Saw Dr. Marigene Ehlers 10/19/21 who recommended sleep study to evaluate Bipap and f/u in 6.mo. They started BIPAP at night.   Care management needs:  He has graduated from high school this May. He has not been  going anywhere. PT comes to the house once a week but this may end. Nurses come to the home.   Equipment needs:  They are working on getting him a new sleep safe bed. He does not wear hand splints or AFOs.    Past Medical History Past Medical History:  Diagnosis Date   Autism    Autism    Diarrhea 06/09/2015   Family history of adverse reaction to anesthesia    MGGM- N/V   Muscular dystrophy ([place])    Scoliosis    Seizures ([place])    Last one 2013   Transient alteration of awareness    Viral gastroenteritis    Yeast infection of the skin 10/24/2020    Surgical History Past Surgical History:  Procedure Laterality Date   CIRCUMCISION     at birth   LAPAROSCOPIC GASTROSTOMY N/A 05/24/2017   Procedure: LAPAROSCOPIC GASTROSTOMY TUBE PLACEMENT;  Surgeon: Stanford Scotland, MD;  Location: [facility];  Service: General;  Laterality: N/A;   SPINE SURGERY N/A    Phreesia 06/29/2020    Family History family history includes Alcohol abuse in his father; Asthma in an other family member; Cancer in his paternal grandfather; Dementia in his paternal grandmother; Heart failure in his maternal grandmother; Hyperlipidemia in his mother and another family member; Hypertension in his maternal grandmother and another family member; Learning disabilities in his mother; Other in his maternal aunt, maternal grandmother, maternal uncle, and mother; Vision loss in an other family member.   Social History Social History   Social History  Narrative   He lives with mother.   He graduated high school in 22.    He is not currently in a day program    He currently is in PT (at home) mom upset they are going to lose this service next year.     Allergies Allergies  Allergen Reactions   Penicillins Hives and Rash    Has patient had a PCN reaction causing immediate rash, facial/tongue/throat swelling, SOB or lightheadedness with hypotension: Yes Has patient had a PCN reaction causing severe rash involving mucus  membranes or skin necrosis: Yes Has patient had a PCN reaction that required hospitalization: No Has patient had a PCN reaction occurring within the last 10 years: No If all of the above answers are "NO", then may proceed with Cephalosporin use.    Docusate Sodium     Rash and cant sleep   Multivitamins     Red in face, itchy, bumps   Other Other (See Comments)    Senkot -- gets rash and cant sleep   Calcitonin Rash   Esomeprazole Magnesium Nausea And Vomiting   Omeprazole Other (See Comments)    Constipation and possible rash after drug was stopped   Simethicone Palpitations and Rash   Sunflower Oil Itching, Rash and Other (See Comments)    GI upset - Avoid products containing this additive   Vitamin D Analogs Other (See Comments)    Rash on face if it contains sunflower seed oil Excessive urine output and thirsty, increase in gas    Medications Current Outpatient Medications on File Prior to Visit  Medication Sig Dispense Refill   diphenhydrAMINE (BENADRYL) 12.5 MG/5ML elixir 5 -10 ml po as needed for hives 3 times a day (Patient not taking: No sig reported)     levalbuterol (XOPENEX) 1.25 MG/3ML nebulizer solution Take 1.25 mg by nebulization every 4 (four) hours as needed for wheezing. (Patient not taking: Reported on 11/18/2021) 72 mL 12   No current facility-administered medications on file prior to visit.   The medication list was reviewed and reconciled. All changes or newly prescribed medications were explained.  A complete medication list was provided to the patient/caregiver.  Physical Exam Pulse (!) 119   Ht 5' 1.02" (1.55 m)   Wt 95 lb (43.1 kg)   SpO2 95%   BMI 17.94 kg/m  Weight for age: Facility age limit for growth percentiles is 20 years.  Length for age: Facility age limit for growth percentiles is 20 years. BMI: Body mass index is 17.94 kg/m. No results found. Gen: well appearing neuroaffected young man Skin: No rash, No neurocutaneous stigmata. HEENT:  Normocephalic, no dysmorphic features, no conjunctival injection, nares patent, mucous membranes moist, oropharynx clear.  Neck: Supple, no meningismus. No focal tenderness. Resp: Clear to auscultation bilaterally CV: Regular rate, normal S1/S2, no murmurs, no rubs Abd: BS present, abdomen soft, non-tender, non-distended. No hepatosplenomegaly or mass Ext: Warm and well-perfused. Contracted in feet.   Neurological Examination: MS: Awake, alert.  Nonverbal, but interactive, reacts appropriately to conversation.   Cranial Nerves: Pupils were equal and reactive to light;  No clear visual field defect, no nystagmus; no ptsosis, face symmetric with full strength of facial muscles, hearing grossly intact, palate elevation is symmetric. Motor-Low tone throughout. Able to raise arms antigravity, unable to move legs antigravity.  Reflexes- Reflexes 2+ and symmetric in the biceps, triceps, patellar and achilles tendon. Plantar responses flexor bilaterally, no clonus noted Sensation: Responds to touch in all extremities.  Coordination: Does  not reach for objects.  Gait: wheelchair dependent, moderate head control.     Diagnosis:  1. Duchenne muscular dystrophy   2. Partial epilepsy with impairment of consciousness   3. Constipation, unspecified constipation type   4. Obstructive sleep apnea syndrome   5. Feeding by G-tube ([place])      Assessment and Plan SHRIHAN PUTT is a 20 y.o. male with history of  Duchenne Muscular Dystrophy, autism spectrum disorder, level 3, restrictive lung disease, compensated congestive heart failure, severe dysphagia requiring tube feeding, obstructive sleep apnea and a recent pathologic fracture of the proximal one third left humerus who presents for follow-up in the pediatric complex care clinic.  Patient seen by case manager, dietician, integrated behavioral health today as well, please see accompanying notes.  I discussed case with all involved parties for coordination  of care and recommend patient follow their instructions as below.   Symptom management:  Current medications seem to be working to prevent seizure, continued today. Continued Clearlax to assist with constipation.   - Ordered nystatin cream and powder to use PRN for rash.  - Continued tegretol, clearlax, and prednisolone   Care coordination: - Scheduled Dr. Theadore Nan Jan 30.  - Seeing Dr. Grady Cellar in May.  - Recommended follow up with Dr. Joaquim Lai.  - Advised mom call Dr. Marius Ditch if he had any problems with constipation or gas.   Care management needs:  Has home visit on 12/17 with Dr. March Rummage for a psychological evaluation to assist with transitions for community support once he turns 21. Mom expressed she would like  to transition to [employer], does not want innovation, as she would like to have nurses rather than hab-techs.  - Reached out to Virgil and Amy about switching to CAP-DA  - Recommend mom keep upcoming evaluation  Equipment needs:  - Due to patient's medical condition, patient is incontinent of stool and urine.  They require diapers, underpads, and gloves to assist with hygiene and skin integrity.  Decision making/Advanced care planning: - Not addressed at this visit, patient remains at full code.    The CARE PLAN for reviewed and revised to represent the changes above.  This is available in Epic under snapshot, and a physical binder provided to the patient, that can be used for anyone providing care for the patient.   I spent 56 minutes on day of service on this patient including review of chart, discussion with patient and family, discussion of screening results, coordination with other providers and management of orders and paperwork.     Return in about 6 months (around 05/19/2022).  I, Scharlene Gloss, scribed for and in the presence of Carylon Perches, MD at today's visit on 11/18/2021.   Carylon Perches MD MPH Neurology,  Neurodevelopment and Neuropalliative care [facility]  [facility] Child Neu[facility]  [address] [place], [place], [place] [address] Phone: [phone]

## 2021-11-11 ENCOUNTER — Telehealth (INDEPENDENT_AMBULATORY_CARE_PROVIDER_SITE_OTHER): Payer: Self-pay | Admitting: Pediatrics

## 2021-11-11 NOTE — Telephone Encounter (Signed)
Left message advising the provider is Dr. [place] Cellar- and RN already faxed the form they sent. Unsure what they need or are referring to. Requested call back tomorrow.

## 2021-11-11 NOTE — Telephone Encounter (Signed)
  Who's calling (name and relationship to patient) :  Best contact [phone]  Provider they see:[name]  Reason for call: Sleep study report or compliance report. Left vm msg was not very clearFax : [phone]    PRESCRIPTION REFILL ONLY  Name of prescription:  Pharmacy:

## 2021-11-12 NOTE — Telephone Encounter (Signed)
Marzetta Board reports needs copy of the previous sleep study results- fax number is [phone]- RN advised was performed there previously. She will look in Epic. RN found 12/22/2018 test was performed and faxed through [place]

## 2021-11-14 NOTE — Progress Notes (Signed)
   Medical Nutrition Therapy - Progress Note Appt start time: 3:35 PM  Appt end time: 4:14 PM  Reason for referral: Gtube Dependence Referring provider: [name] - PC3 DME: [facility]  Pertinent medical hx: Duchenne musclar dystrophy, neuromuscular scoliosis, autism, seizures, FTT, +Gtube  Assessment: Food allergies: sunflower seeds (gets red and rashes frequently) Pertinent Medications: see medication list Vitamins/Supplements (if liquid, how much?): none Pertinent labs: (11/4) Glucose - 114 (high)   (12/8) Anthropometrics: Ht: 155 cm  Wt: 43.1 kg BMI: 17.94   10/29/21 Wt: 43.6 kg 06/18/21 Wt: 43.2 kg 03/18/21 Wt: 43.4 kg 09/10/20 Wt: 41.1 kg  Estimated minimum caloric needs: 23 kcal/kg/day (based on maintenance with current regimen) Estimated minimum protein needs: 0.85 g/kg/day (DRI) Estimated minimum fluid needs: 45 mL/kg/day (Holliday Segar)  Primary concerns today: Follow-up for Gtube dependence. Mom and home health RN accompanied pt to appt today.   Dietary Intake Hx: Formula: Dillard Essex Pediatric Peptide 1.0  Current regimen:  Day feeds: 250 mL @ 250 mL/hr x 4 feeds  (6 AM, 9 AM, 12 PM, 3 PM) Overnight feeds: none Total Volume: 1000 mL   FWF: 100 mL before and after feeds, 150 mL x2/day (1100 mL total) Supplements: none PO foods/beverages: typically none (tastes of purees every now and then)  GI: 1-4/day (typically regular) - Miralax if needed GU: no concern   Physical Activity: wheelchair bound  Estimated caloric intake: 23 kcal/kg/day - meets 100% of estimated needs Estimated protein intake: 0.8 g/kg/day - meets 94% of estimated needs Estimated fluid intake: 44 mL/kg/day - meets 98% of estimated needs  Micronutrient intake:  Vitamin A 600 mcg  Vitamin C 60 mg  Vitamin D 30 mcg  Vitamin E 8 mg  Vitamin K 64 mcg  Vitamin B1 (thiamin) 2.4 mg  Vitamin B2 (riboflavin) 2 mg  Vitamin B3 (niacin) 10 mg  Vitamin B5 (pantothenic acid) 10 mg   Vitamin B6 2.4 mg  Vitamin B7 (biotin) 24 mcg  Vitamin B9 (folate) 428 mcg  Vitamin B12 6 mcg  Choline 340 mg  Calcium 1200 mg  Chromium 38 mcg  Copper 800 mcg  Fluoride 0 mg  Iodine 100 mcg  Iron 14 mg  Magnesium 200 mg  Manganese 1.8 mg  Molybdenum 30 mcg  Phosphorous 900 mg  Selenium 48 mcg  Zinc 8 mg  Potassium 1500 mg  Sodium 640 mg  Chloride 900 mg  Fiber 8 g    Nutrition Diagnosis: (12/8) Inadequate oral intake related to medical status and dysphagia as evidenced by pt dependent on Gtube feedings to meet nutritional needs.  Intervention: Discussed pt's growth and current regimen. Discussed need for adult formula to aid in micronutrient needs being met given Elion's intolerance to NanoVM.  Discussed recommendations below. All questions answered, family in agreement with plan.   Nutrition Recommendations: - Transition to [place] Pediatric Peptide 1.0 and 2 Dillard Essex Standard Peptide 1.0.  - I will work on sending you a sample case as well as putting in a new order for the standard peptide 1.0.   This new regimen will provide 27 kcal/kg/day, 1.2 g protein/kg/day, 47 mL/kg/day.  Teach back method used.  Monitoring/Evaluation: Continue to Monitor: - Wt trends  - TF tolerance - Need for micronutrient supplementation   Follow-up in 6 months, joint with [name].  Total time spent in counseling: 40 minutes.

## 2021-11-15 ENCOUNTER — Other Ambulatory Visit (INDEPENDENT_AMBULATORY_CARE_PROVIDER_SITE_OTHER): Payer: Self-pay | Admitting: Family

## 2021-11-16 NOTE — Telephone Encounter (Signed)
Dr. Brent Bulla office note:   Telephone Encounter - Prudence Davidson, RN - 09/24/2021 10:10 AM EDT Formatting of this note might be different from the original. Called pharmacy. Stated they had prednisolone script on file with 8 refills remaining. Stated script could be declined.  Electronically signed by Prudence Davidson, RN at 09/24/2021 10:25 AM EDT   Call to [facility]- left a message to confirm they should still have 6 refills on file based on the above note from [facility]. Advised refills may be under Dr. Solmon Ice as they typically manage it. Requested call back if refills are needed.

## 2021-11-18 ENCOUNTER — Encounter (INDEPENDENT_AMBULATORY_CARE_PROVIDER_SITE_OTHER): Payer: Self-pay | Admitting: Dietician

## 2021-11-18 ENCOUNTER — Ambulatory Visit (INDEPENDENT_AMBULATORY_CARE_PROVIDER_SITE_OTHER): Payer: Medicaid Other

## 2021-11-18 ENCOUNTER — Other Ambulatory Visit: Payer: Self-pay

## 2021-11-18 ENCOUNTER — Ambulatory Visit (INDEPENDENT_AMBULATORY_CARE_PROVIDER_SITE_OTHER): Payer: Medicaid Other | Admitting: Pediatrics

## 2021-11-18 ENCOUNTER — Encounter (INDEPENDENT_AMBULATORY_CARE_PROVIDER_SITE_OTHER): Payer: Self-pay | Admitting: Pediatrics

## 2021-11-18 ENCOUNTER — Ambulatory Visit (INDEPENDENT_AMBULATORY_CARE_PROVIDER_SITE_OTHER): Payer: Medicaid Other | Admitting: Dietician

## 2021-11-18 VITALS — HR 119 | Ht 61.02 in | Wt 95.0 lb

## 2021-11-18 DIAGNOSIS — G7101 Duchenne or Becker muscular dystrophy: Secondary | ICD-10-CM | POA: Diagnosis not present

## 2021-11-18 DIAGNOSIS — G4733 Obstructive sleep apnea (adult) (pediatric): Secondary | ICD-10-CM | POA: Diagnosis not present

## 2021-11-18 DIAGNOSIS — G40209 Localization-related (focal) (partial) symptomatic epilepsy and epileptic syndromes with complex partial seizures, not intractable, without status epilepticus: Secondary | ICD-10-CM

## 2021-11-18 DIAGNOSIS — Z7189 Other specified counseling: Secondary | ICD-10-CM

## 2021-11-18 DIAGNOSIS — K5900 Constipation, unspecified: Secondary | ICD-10-CM | POA: Diagnosis not present

## 2021-11-18 DIAGNOSIS — Z931 Gastrostomy status: Secondary | ICD-10-CM

## 2021-11-18 MED ORDER — CARBAMAZEPINE 100 MG PO CHEW: CHEWABLE_TABLET | ORAL | 0 refills | Status: DC

## 2021-11-18 MED ORDER — PREDNISOLONE SODIUM PHOSPHATE 15 MG/5ML PO SOLN: ORAL | 11 refills | Status: DC

## 2021-11-18 MED ORDER — NYSTATIN 100000 UNIT/GM EX POWD
1.0000 | Freq: Three times a day (TID) | CUTANEOUS | 1 refills | Status: DC
Start: 2021-11-18 — End: 2022-03-04

## 2021-11-18 MED ORDER — NUTRITIONAL SUPPLEMENT PLUS PO LIQD: ORAL | 12 refills | Status: DC

## 2021-11-18 MED ORDER — NYSTATIN-TRIAMCINOLONE 100000-0.1 UNIT/GM-% EX OINT: 1.0000 "application " | TOPICAL_OINTMENT | Freq: Two times a day (BID) | CUTANEOUS | 3 refills | Status: DC

## 2021-11-18 MED ORDER — POLYETHYLENE GLYCOL 3350 17 GM/SCOOP PO POWD: ORAL | 11 refills | Status: DC

## 2021-11-18 NOTE — Progress Notes (Signed)
RD faxed orders for 2 [facility] Pediatric Peptide 1.0 and 2 Dillard Essex Standard Peptide 1.0 to [facility] @ [phone].

## 2021-11-18 NOTE — Progress Notes (Signed)
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  Neck: Supple, no meningismus. No focal tenderness. Resp: normal work of breathing. CPAP at night with a full face mask CV: well perfused-05/11/18 - normal biventricular size and function Abd: Abdomen non-distended. G-tube c/d/i. No hepatosplenomegaly or mass, history of reflux and constipation Ext: No deformities, mod muscle wasting, ROM full. MS: Awake, alert.  Nonverbal, but interactive with mother. Smiles responsively and appears to follow simple commands or to understand at times.   Cranial Nerves: No nystagmus; no ptsosis, face symmetric with full strength of facial muscles, hearing grossly intact, palate elevation is symmetric. Motor- Able to move arms at least antigravity. No abnormal movements Sensation: Responds to touch in all extremities.  Coordination: Does not reach for objects.  Gait: wheelchair dependent.   Neurologic - severe generalized weakness with wheelchair dependence, significant intellectual deficit, no language, dependent upon caregivers for all ADL's Pulmonary - has sleep apnea, uses CPAP at night GI - has dysphagia with  gastrostomy tube dependence (14 fr 2.0 cm Mini-one) and purees or Gerber pouches by mouth for pleasure Incontinent of stool and urine  Guardians/Caregivers: Cully Luckow (mother) ph [phone]  [email or web address] nursing  Recent Events: ER 6/13- fecal impaction Continue on CPAP at 11 cm H2O - good compliance Reduce prednisone from 5 ml to 3 ml (9 mg) - due to side effect of disrupting sleep Discussed recent gene therapy trials. Discussed sign up for MDA news letter.  Per GI- Stop docusate. Continue Miralax daily and senna PRN if no bowel movement by late afternoon.           Upcoming Plans: 10/21/2021 at 3:00 PM- Dr. Edison Simon- about new medication trial for Duchenne's Order placed probably Jan/Feb follow up sleep study with Bi-PAP Dr. [place] Cellar reports can wait until 2022 after Covid has decreased 01/11/2022 10:15 AM Dr. Theadore Nan 04/15/2022 1:30 PM Dr. [place] Cellar 10/17/2022 2:00 PM Dr. Baldemar Lenis  Feeding: Last updated:  DME: [facility] - fax [phone] Formula: Dillard Essex Pediatric Peptide 1.0 and 2 of  Dillard Essex Standard Peptide 1.0  Current regimen:  Day feeds: 325 (1 bottle) mL @  325 mL/hr x 5 feeds @ 6 AM, 9 AM, 12 PM, 3 PM,  Overnight feeds: none  FWF: 100 mL before and after each feed (1000 mL total)- 150 AM and PM   PO: limited -  Supplements:   Past/failed meds: Appears to be allergic to an additive in many liquid medicines- Sunflower oil- causes red, itchy, raised rash on his face Mom feels reflux medications caused vomiting .DOES NOT TOLERATE any reflux meds Intolerant of Ca and Vitamin D supplements- Tolerates if without sunflower oil as an added ingredient Melatonin- possible rash and  bradycardia  Providers: [name], MD,  ([place]) ph [phone] fax [phone] [name], MD [facility] cardiology) ph [phone] fax [phone] [name], MD [facility] orthopedics) [facility]- ph  fax [phone] [name], MD (Neurology/Muscular dystrophy clinic at  [facility]) ph [phone] fax [place], [facility]) ph [phone] fax [phone] Salvadore Oxford, Gregg ([place] [facility] dietitian) ph [phone] fax [phone] Pat Patrick, MD (Pediatric Pulmonology at [facility]) [phone] Fax: [phone] [name], MD ([place]) ph. [phone] Fax [phone] (following for fx arm) Sherri Sear, MD ([facility]) ph. [phone] Fax   [phone]  Community support/services: [facility] ph [phone] Trixie Deis, PT- 1 x a month until age 35  [place] [facility] 84 hours per week has respite hours - [place] [phone], fax [phone]  CAP/C  [name] caseworker -Amy Child psychotherapist with Daun Peacock, RN [phone]  CAP-C  ph [phone] fax [phone] Dorian Pod and Earney Hamburg (cousins)   MDA society not helpful to mom Per [facility] supervisor he will not lose his nursing care when he turns 21 but may have to reapply for Adult Medicaid and they would bill through the Adult Medicaid. He cannot chose [facility] because they do not provide nursing only [employer] level of care.   Equipment: [facility]- [phone] CPAP at 11,Nebulizer, Suction- pulse ox [address] and [employer]-Travis Hughes [phone] [facility] lift, Sleep safe bed ordering new one 2022, Wheelchair  05/2019 modifications 2022, Bath chair  Hill Rom-Respiratory vest,Cough assist machine- BID- QID [place] ([facility])-ph: [phone] fax: [phone] Incontinence supplies, Zevex pump, G-tube Mini-one, formula 14 fr 2.3 cm Mini One button Hand splints bilaterally- Left is worse than right  **Needs a van. Currently lifting him into seat, then pushing wheelchair the van by a ramp.**   Goals of care:  do remote learning for safety  Advance care  planning: Full Code    Psychosocial: Lives with mom   Diagnostics/Screenings: EEG showed right central diphasic sharply contoured slow-wave activity.   MRI of the brain failed to show a structural abnormality.   Autism was diagnosed at age 39 Duchenne muscular dystrophy was diagnosed at age 47. Polysomnogram October 2018 at [facility] - obstructive sleep apnea EKG 05/11/18 - normal sinus rhythm with normal PR, QRS and corrected QT interval Echocardiogram 05/11/18 - normal biventricular size and function 11/28/19 Echo- ECG: normal rate and rhythm, hear rate 95/minute is actually a bit lower than last 2 ECG's, favorable.Echocardiogram: normal chamber size, wall thickness, squeezing. 05/29/2020: proximal third left humeral shaft fracture with varus angulation.  Osteoporotic bone noted  05/24/2020 abdominal x-ray Large amount of stool at the level of the rectum 06/19/2020 Spinal X-rays: Lungs are clear. Heart is normal size. Normal bowel gas pattern. 30 degrees generalized convex rightward scoliosis in    the thoracolumbar spine. 12/01/2020 Echo:See ov note basically unchanged follow up in 12-18 months  Rockwell Germany NP-C and Carylon Perches, MD Pediatric Complex Care Program Ph. [phone] Fax [phone]

## 2021-11-18 NOTE — Patient Instructions (Addendum)
Refilled all of his medications today. Please call Dr. Marius Ditch if he had any problems with constipation or gas.  We will talk to Clark and Amy about switching to CAP-DA so he can keep his nurses.  Please keep appointment for psychological evaluation.

## 2021-11-18 NOTE — Telephone Encounter (Signed)
Call back from Pharm. They do have refills on file from Dr. Brent Bulla office this office does not need to submit refills

## 2021-11-18 NOTE — Patient Instructions (Signed)
Nutrition Recommendations: - Transition to [place] Pediatric Peptide 1.0 and 2 Dillard Essex Standard Peptide 1.0.  - I will work on sending you a sample case as well as putting in a new order for the standard peptide 1.0.

## 2021-11-19 NOTE — Progress Notes (Signed)
Fax busy, orders sent to [phone] as [employer] is a part of [facility].

## 2021-11-22 ENCOUNTER — Telehealth (INDEPENDENT_AMBULATORY_CARE_PROVIDER_SITE_OTHER): Payer: Self-pay | Admitting: Dietician

## 2021-11-22 NOTE — Telephone Encounter (Signed)
  Who's calling (name and relationship to patient) :Nurse   Best contact number: [phone]  Provider they PTE:LMRAJ   Reason for call: Nurse wants to know the amount of peptide that should be administered since converting to the standard     PRESCRIPTION REFILL ONLY  Name of prescription:  Pharmacy:

## 2021-11-22 NOTE — Telephone Encounter (Signed)
RD spoke with Lucas [name]'s nurse who was confused about Lucas [name]'s new order and if the rate would change. RD clarified that the rate could stay the same at 250 mL/hr however would just take longer than an hour for 2 feeds that were the [employer] adult formula given that they are 325 mL. RD discussed with RN that if family would like to increase rate to have all feeds be 1 hour then to increase rate by 5 mL per adult formula feed to goal rate of 325 mL/hr. RD confirmed that regardless of rate, it was important that Lucas [name] received all 4 cartons of formula daily. RN and family in agreement with plan.

## 2021-11-23 ENCOUNTER — Telehealth (INDEPENDENT_AMBULATORY_CARE_PROVIDER_SITE_OTHER): Payer: Self-pay

## 2021-11-23 NOTE — Telephone Encounter (Signed)
Received Emails from CAP-C case manager Jeani Hawking and CAP-C SW Marlowe Kays- mom wants to do the sleep study at home. RN told her previously it cannot be done in home but RN will confirm. Dr. [place] Cellar approves in home IF they can do Bi-Pap titration in the home.   Call to [facility] at [employer]- she confirmed the sleep study with Bi-pap titration has to be performed in the hospital. She also reports it requires mom and Jcion to have a Covid test at [facility] or the pre-admission dept no more than 5 days prior to the test. She confirms she does have the order for the Bi-pap titration and the paperwork completed was also for that.   Call to Pre-admissions at the [facility]- left message to determine if patients nurse can pick up the test perform them on mom and patient and return them to prevent them from having to take transportation 2 x . Case Manager updated

## 2021-11-28 ENCOUNTER — Encounter (INDEPENDENT_AMBULATORY_CARE_PROVIDER_SITE_OTHER): Payer: Self-pay | Admitting: Pediatrics

## 2021-11-29 ENCOUNTER — Telehealth (INDEPENDENT_AMBULATORY_CARE_PROVIDER_SITE_OTHER): Payer: Self-pay | Admitting: Dietician

## 2021-11-29 ENCOUNTER — Encounter (INDEPENDENT_AMBULATORY_CARE_PROVIDER_SITE_OTHER): Payer: Self-pay

## 2021-11-29 ENCOUNTER — Encounter (INDEPENDENT_AMBULATORY_CARE_PROVIDER_SITE_OTHER): Payer: Self-pay | Admitting: Pediatrics

## 2021-11-29 NOTE — Telephone Encounter (Signed)
RD called mom to discuss Lucas [name]'s constipation. Mom expressed concern for Lucas [name] not tolerating 2 Dillard Essex Standard Peptide 1.0 per day and requested that he have [place] Pediatric Peptide per day and 1 [employer] Standard Peptide 1.0. RD was in agreement in order to build tolerance to the adult formula. RD encouraged mom to slowly increase to 2 of the pediatric and 2 of the adult formula per day per previously discussed regimen. RD also encouraged mom to give extra FWF if constipation continues. Mom in agreement with plan.

## 2021-12-02 ENCOUNTER — Other Ambulatory Visit (INDEPENDENT_AMBULATORY_CARE_PROVIDER_SITE_OTHER): Payer: Self-pay | Admitting: Dietician

## 2021-12-02 DIAGNOSIS — Z931 Gastrostomy status: Secondary | ICD-10-CM

## 2021-12-02 MED ORDER — NUTRITIONAL SUPPLEMENT PLUS PO LIQD: ORAL | 12 refills | Status: DC

## 2021-12-02 NOTE — Addendum Note (Signed)
Addended by: Salvadore Oxford D on: 12/02/2021 11:24 AM   Modules accepted: Orders

## 2021-12-02 NOTE — Progress Notes (Signed)
Mom expressed concern for rash on Lucas [name] that she feels is from the adult [employer] Peptide 1.0 (adult formula). RD explained that the adult formula is just more volume of the pediatric formula therefore unlikely that it would cause a rash. However, mom voiced concern and would like to move forward with switching back to [facility] Pediatric Peptide 1.0. RD discussed a multivitamin (Centrum Liquid Adult - 1 tbsp) given daily as well as 1/2 tsp Morton's Lite Salt. This will not fully meet [name]'s nutrient needs, but will aid in micronutrient needs being met on a pediatric formula. Mom in agreement with plan.

## 2021-12-03 ENCOUNTER — Encounter (INDEPENDENT_AMBULATORY_CARE_PROVIDER_SITE_OTHER): Payer: Self-pay | Admitting: Dietician

## 2021-12-03 NOTE — Progress Notes (Signed)
Faxed updated feeding RX to [facility] at [phone]

## 2021-12-15 ENCOUNTER — Other Ambulatory Visit
Admission: RE | Admit: 2021-12-15 | Discharge: 2021-12-15 | Disposition: A | Discharge status: Home / Self Care | Payer: Medicaid Other | Source: Ambulatory Visit | Attending: Pediatrics | Admitting: Pediatrics

## 2021-12-15 ENCOUNTER — Encounter: Payer: Self-pay | Admitting: Urgent Care

## 2021-12-15 ENCOUNTER — Telehealth (INDEPENDENT_AMBULATORY_CARE_PROVIDER_SITE_OTHER): Payer: Self-pay | Admitting: Dietician

## 2021-12-15 DIAGNOSIS — Z20822 Contact with and (suspected) exposure to covid-19: Secondary | ICD-10-CM | POA: Diagnosis not present

## 2021-12-15 DIAGNOSIS — Z1152 Encounter for screening for COVID-19: Secondary | ICD-10-CM

## 2021-12-15 DIAGNOSIS — Z01812 Encounter for preprocedural laboratory examination: Secondary | ICD-10-CM | POA: Insufficient documentation

## 2021-12-15 NOTE — Telephone Encounter (Signed)
°  Who's calling (name and relationship to patient) :Jake home health nurse/ Mom   Best contact [place]  Provider they [facility]:JWTGR Donna Christen   Reason for call:requested a call back because mom asked him to call about questions she has with multi vitamin that he is taking      PRESCRIPTION REFILL ONLY  Name of prescription:  Pharmacy:

## 2021-12-15 NOTE — Telephone Encounter (Signed)
RD called back home health RN to discuss Lucas [name]'s MVI. RD left VM to call back at earliest convenience.

## 2021-12-16 LAB — SARS CORONAVIRUS 2 (TAT 6-24 HRS): SARS Coronavirus 2: NEGATIVE

## 2021-12-17 ENCOUNTER — Ambulatory Visit: Payer: Medicaid Other | Attending: Neurology

## 2021-12-17 ENCOUNTER — Telehealth (INDEPENDENT_AMBULATORY_CARE_PROVIDER_SITE_OTHER): Payer: Self-pay | Admitting: Dietician

## 2021-12-17 DIAGNOSIS — G4733 Obstructive sleep apnea (adult) (pediatric): Secondary | ICD-10-CM | POA: Insufficient documentation

## 2021-12-17 DIAGNOSIS — G4736 Sleep related hypoventilation in conditions classified elsewhere: Secondary | ICD-10-CM | POA: Diagnosis not present

## 2021-12-17 NOTE — Telephone Encounter (Signed)
RD spoke with mom about Avory's multivitamin and intolerance to Centrum Adult Liquid (1 tbsp). RD discussed other vitamin previously discussed with mom Nature's Way ALIVE Max Potency and recommended mom give small amounts until she reaches goal of 1 tablespoon. Mom in agreement and said Rossi has been tolerating this vitamin as she has tried both.

## 2021-12-17 NOTE — Telephone Encounter (Signed)
Who's calling (name and relationship to patient) : Lucas [name] mom   Best contact number: [phone]  Provider they see: Salvadore Oxford  Reason for call: Mom states she would like to speak with Salvadore Oxford. Please call when possible.   Call ID:      PRESCRIPTION REFILL ONLY  Name of prescription:  Pharmacy:

## 2021-12-18 ENCOUNTER — Other Ambulatory Visit: Payer: Self-pay

## 2021-12-18 ENCOUNTER — Encounter: Payer: Self-pay | Admitting: Emergency Medicine

## 2021-12-18 DIAGNOSIS — R197 Diarrhea, unspecified: Secondary | ICD-10-CM | POA: Diagnosis present

## 2021-12-18 DIAGNOSIS — R140 Abdominal distension (gaseous): Secondary | ICD-10-CM | POA: Diagnosis not present

## 2021-12-18 DIAGNOSIS — F840 Autistic disorder: Secondary | ICD-10-CM | POA: Insufficient documentation

## 2021-12-18 NOTE — ED Notes (Signed)
Per ems pt with hr up to 150 once while sleeping. Per ems pt has a normal hr while sleeping of 120-30. Per ems pt is smiling and laughing and appear sin no acute distress. Vitals hr 128, 138/68, 100% ra per ems.

## 2021-12-18 NOTE — ED Triage Notes (Signed)
Pt BIB EMS accompanied by Mother, Mother reports pt was instructed by a nutritionist on Thurs to give Centrum liquid multivitamin, pt had 1 dose and since has had intermittent elevated heart rate, decreased sleep and abdominal bloating

## 2021-12-19 ENCOUNTER — Emergency Department
Admission: EM | Admit: 2021-12-19 | Discharge: 2021-12-19 | Disposition: A | Discharge status: Home / Self Care | Payer: Medicaid Other | Attending: Emergency Medicine | Admitting: Emergency Medicine

## 2021-12-19 DIAGNOSIS — R197 Diarrhea, unspecified: Secondary | ICD-10-CM

## 2021-12-19 MED ORDER — CARBAMAZEPINE ER 200 MG PO TB12: 200.0000 mg | ORAL_TABLET | Freq: Every day | ORAL | Status: DC

## 2021-12-19 MED ORDER — CARBAMAZEPINE 100 MG PO CHEW
150.0000 mg | CHEWABLE_TABLET | Freq: Once | ORAL | Status: DC
  Filled 2021-12-19 (×2): qty 1.5

## 2021-12-19 NOTE — ED Notes (Addendum)
Mother requested transfer back home via [facility]. Patient arrived via EMS. Mother reported she was able to ride with him on the way here and had to ride on the way back with him also because she does not have a car. Let her know we would request transport via EMS through our secretary. Mother states "we absolutely have to be home by 7:30 for his seizure medication." Let her know we don't have any control of what time they come but we would call them and would let them know as soon as here for pickup. Med necessity form to be filled out

## 2021-12-19 NOTE — Discharge Instructions (Signed)
Please discontinue use of Dillard Essex feeding supplement and instead use Pediasure as the patient seems to better tolerate this.  Please follow-up with your doctor in the next 2 to 3 days for recheck/reevaluation.  Return to the emergency department for any apparent abdominal pain, any fever 101 or higher, or any other symptom personally concerning to yourself.

## 2021-12-19 NOTE — ED Notes (Signed)
Discharge papers given to mother/legal guardian for patient. She verbalized understanding of discharges. Let her know [facility] had been contacted for pickup.

## 2021-12-19 NOTE — ED Notes (Signed)
[facility] to transport pt home after shift change

## 2021-12-19 NOTE — ED Notes (Signed)
Assisted mother to help clean patient after he had diarrhea in diaper. Helped clean with wipes, replace diaper and remove dirty chux pad.

## 2021-12-19 NOTE — ED Notes (Signed)
Messaged pharmacy for Tegretol dose missing in ED tube station/cabinet because per mother has to have by 730 am and since has to be transported via EMS and is waiting for transport, MD ok'd giving in hospital.

## 2021-12-19 NOTE — ED Provider Notes (Signed)
[facility] Provider Note    Event Date/Time   First MD Initiated Contact with Patient 12/19/21 0403     (approximate)  History   Chief Complaint: Medication Reaction  HPI  Lucas [name] is a 21 y.o. male with a past medical history of autism, muscular dystrophy, wheelchair/bedbound, seizure disorder, presents to the emergency department for intermittent increased heart rate and diarrhea.  According to mom with whom the patient lives they have a home health nurse who comes and checks on the patient daily helps with his tube feedings.  They noted that the patient's heart rate will intermittently be elevated however then appears to come down on its own.  Mom is also noted the patient has been experiencing diarrhea today but states he was constipated and she gave him an enema yesterday.  Patient found to have a borderline low-grade temperature at 99.9 in the emergency department.  Mom states this is typical for the patient.  Physical Exam   Triage Vital Signs: ED Triage Vitals  Enc Vitals Group     BP 12/18/21 2305 116/80     Pulse Rate 12/18/21 2305 (!) 134     Resp 12/18/21 2305 18     Temp 12/18/21 2305 99.9 F (37.7 C)     Temp Source 12/18/21 2305 Oral     SpO2 12/18/21 2305 100 %     Weight 12/18/21 2309 95 lb (43.1 kg)     Height 12/18/21 2309 $RemoveBefor'5\' 1"'oHOqBktpBoDH$  (1.549 m)     Head Circumference --      Peak Flow --      Pain Score --      Pain Loc --      Pain Edu? --      Excl. in [place]? --     Most recent vital signs: Vitals:   12/18/21 2305  BP: 116/80  Pulse: (!) 134  Resp: 18  Temp: 99.9 F (37.7 C)  SpO2: 100%    General: Awake, no distress.  CV:  Good peripheral perfusion.  On my evaluation heart rate ranging between 105 and 110 bpm. Resp:  Normal effort.  Equal breath sounds bilaterally.  No wheeze rales or rhonchi. Abd:  No distention.  Soft, nontender.  No rebound or guarding.  Hyperactive bowel sounds.    ED Results / Procedures /  Treatments   MEDICATIONS ORDERED IN ED: Medications - No data to display   IMPRESSION / MDM / [place] / ED COURSE  I reviewed the triage vital signs and the nursing notes.  Patient presents to the emergency department with mom for evaluation of intermittent fast heart rate as well as diarrhea and abdominal bloating today.  According to mom since the patient was switched from [place] recently to [facility] tube feedings he has been experiencing intermittent diarrhea followed by episodes of constipation and bloating.  Mom states the patient was constipated up until yesterday when they gave the patient an enema and today they have noted diarrhea.  States that home health nurse earlier today noted that the patient's heart rate was elevated so they sent him to the emergency department.  Currently on my evaluations patient heart rate is around 105 bpm.  Mom states this is typical for the patient.  During his evaluation he was also found to have a low-grade temperature 99.9.  Mom states this is also very typical for the patient and earlier today it was 98.5.  Patient does not appear to have  any abdominal tenderness and his abdomen is soft.  Given the diarrhea and the borderline low-grade temperature I did discuss with mom further work-up including COVID test and imaging.  Mom states she would prefer to hold off as they have been in the emergency department very long time and they are ready to go home (patient has had to wait nearly 6 hours in the waiting room).  I spoke to mom regarding return precautions if the patient has any apparent abdominal pain or spikes a fever 100.4 or higher they are to return to the emergency department for more in-depth work-up.  Mom agreeable to plan of care.  Overall the patient does appear well, mom states he is at his baseline and she wishes to take him home.  FINAL CLINICAL IMPRESSION(S) / ED DIAGNOSES   Diarrhea   Rx / DC Orders   None  Note:  This  document was prepared using Dragon voice recognition software and may include unintentional dictation errors.   Harvest Dark, MD 12/19/21 [phone]

## 2021-12-19 NOTE — ED Notes (Signed)
[facility] arrived to transport patient and mother/caregiver home. Per mother, they live close by and she has a 30 minute window for giving the Tegretol and she elected to give at home since pt has  G_tube and requires specialized syringes for med admin.

## 2021-12-20 ENCOUNTER — Other Ambulatory Visit: Payer: Self-pay

## 2021-12-20 ENCOUNTER — Encounter (INDEPENDENT_AMBULATORY_CARE_PROVIDER_SITE_OTHER): Payer: Self-pay

## 2021-12-20 NOTE — Telephone Encounter (Signed)
Arvine's mom Olivia Mackie has an additional question for Mason. Requests call back at [phone]

## 2021-12-20 NOTE — Telephone Encounter (Signed)
Please follow up with mom about Ruven and starting up Baring after visit to emergency room

## 2021-12-20 NOTE — Telephone Encounter (Signed)
RD spoke with mom about concern on Lucas [name]'s bloating and interest in switching to pediasure. Mom noted that he had bloating and diarrhea on Friday and felt it was from Lucas [name]. RD explained that given Lucas [name]'s history of tolerating KF, it was likely a GI concern rather than formula intolerance. RD encouraged mom to call GI provider if GI symptoms (bloating, diarrhea, constipation, etc) persist. Mom in agreement with plan.

## 2021-12-21 NOTE — Telephone Encounter (Signed)
Patient has gas again. Mom thinks it has to do with starting tate farms (unsure if this is correct words or spelling) mom would like to speak with grace asap. She is very concerned. Mom thinks pediasure may be better.

## 2021-12-24 ENCOUNTER — Telehealth (INDEPENDENT_AMBULATORY_CARE_PROVIDER_SITE_OTHER): Payer: Self-pay | Admitting: Dietician

## 2021-12-24 ENCOUNTER — Encounter (INDEPENDENT_AMBULATORY_CARE_PROVIDER_SITE_OTHER): Payer: Self-pay

## 2021-12-24 NOTE — Telephone Encounter (Signed)
°  Who's calling (name and relationship to patient) : Mother  Best contact number: [phone]   Provider they YME:BRAXE   Reason for call:Levoy is experiencing a lot of gas and want to know if she can give him pediasure intstead of what he is taking now     [place]  Name of prescription:  Pharmacy:

## 2021-12-24 NOTE — Telephone Encounter (Signed)
Sent My-Chart message

## 2021-12-27 ENCOUNTER — Other Ambulatory Visit (INDEPENDENT_AMBULATORY_CARE_PROVIDER_SITE_OTHER): Payer: Self-pay | Admitting: Dietician

## 2021-12-27 DIAGNOSIS — Z931 Gastrostomy status: Secondary | ICD-10-CM

## 2021-12-27 MED ORDER — NUTRITIONAL SUPPLEMENT PLUS PO LIQD: ORAL | 12 refills | Status: DC

## 2021-12-27 NOTE — Telephone Encounter (Signed)
Addressed via mychart.   Carylon Perches MD MPH

## 2021-12-27 NOTE — Progress Notes (Signed)
New orders put in for Ensure complete.

## 2021-12-29 ENCOUNTER — Telehealth (INDEPENDENT_AMBULATORY_CARE_PROVIDER_SITE_OTHER): Payer: Self-pay | Admitting: Pediatrics

## 2021-12-29 NOTE — Telephone Encounter (Signed)
From the mychart messages Lucas [name]'s current feeds are  290ml at 265ml/hr x 4 feeds (6am, 9am, 12pm, 3pm) Free water flush 168ml before and after feeds and 8ml twice daily    Spoke with mom and she informs she has not received the ordered Ensure, but went to the store and picked up some to start giving him. Mom states what she purchased were 10oz bottles (267ml) mom was unsure how to measure out his required feed from what was purchased. Let mom know that she could use a measuring cup to measure out the oz conversion (274ml= 7.5 fl oz). Explained to mom that she could use a measuring cup to pour the contents from the bottle until it is full and prepare the feed. After a couple of attempts mom was able to understand opening one bottle per feed and keeping the remainder of each bottle for the last feed. Explained to mom that the last feed would be a little shy of one full cup because the order was for 7.5 oz and 1 cup is 8oz. Mom stated understanding that the last feed would be a little less, but it was okay because he was getting the ordered amount per day and it was close to what was ordered. Mom asked that this information be sent to her in a MyChart message so that the home health staff who assist her would be able to see this as well. MyChart sent prior to documentation of this call. Mom encouraged to contact this office if she has any further concerns.

## 2021-12-29 NOTE — Telephone Encounter (Signed)
Who's calling (name and relationship to patient) : Lucas [name]  Best contact number: [phone]  Provider they see: Rogers Blocker  Reason for call: Mom has questions about how to divide three ensure completes between four feeds. Mom states she would like a call back or for someone to send her a mychart message  Call ID:      PRESCRIPTION REFILL ONLY  Name of prescription:  Pharmacy:

## 2021-12-30 ENCOUNTER — Telehealth (INDEPENDENT_AMBULATORY_CARE_PROVIDER_SITE_OTHER): Payer: Self-pay | Admitting: Dietician

## 2021-12-30 ENCOUNTER — Telehealth (INDEPENDENT_AMBULATORY_CARE_PROVIDER_SITE_OTHER): Payer: Self-pay

## 2021-12-30 ENCOUNTER — Encounter (INDEPENDENT_AMBULATORY_CARE_PROVIDER_SITE_OTHER): Payer: Self-pay

## 2021-12-30 DIAGNOSIS — J984 Other disorders of lung: Secondary | ICD-10-CM

## 2021-12-30 DIAGNOSIS — G7101 Duchenne or Becker muscular dystrophy: Secondary | ICD-10-CM

## 2021-12-30 DIAGNOSIS — G4733 Obstructive sleep apnea (adult) (pediatric): Secondary | ICD-10-CM

## 2021-12-30 NOTE — Telephone Encounter (Signed)
°  Who's calling (name and relationship to patient) : Lucas [name]; mom  Best contact number: [phone]  Provider they see:  Reason for call: Mom stated that an order was made on Monday, and it is now Thursday and she still has not received the order. She would like to know when the order for Ensure will be sent.     PRESCRIPTION REFILL ONLY  Name of prescription:  Pharmacy:

## 2021-12-30 NOTE — Telephone Encounter (Signed)
My chart message sent to mom Yes Grace sent the order to [facility] but that is just 1 step in the process. They have to send [place] paperwork to complete saying why the formula needed to be changed and then they have to submit that to Medicaid to obtain approval. Once we send the order it can take a week to get the paperwork and then I am not sure how soon before Medicaid approves it.  [name] is going to refer Rishawn to an adult Dietitian I know used to see someone in [place] a few years ago. We will also try to contact his GI doctor to see who they recommend.  Thanks, Judson Roch

## 2021-12-30 NOTE — Progress Notes (Signed)
Order with sleep study rec for BiPAP settings faxed to [employer] at [phone]- message to mom this was done.

## 2021-12-30 NOTE — Telephone Encounter (Signed)
Spoke with [facility] and requested results from sleep study on 12/21/2021 be faxed to our office for MD to review tomorrow. Confirmed they do not make any changes on the machine MD would need to send those to the DME company to adjust.  [place]

## 2021-12-31 NOTE — Telephone Encounter (Signed)
Orders, copy of sleep study faxed to [facility] with MD note.

## 2022-01-02 ENCOUNTER — Other Ambulatory Visit (INDEPENDENT_AMBULATORY_CARE_PROVIDER_SITE_OTHER): Payer: Self-pay | Admitting: Pediatrics

## 2022-01-02 DIAGNOSIS — Z931 Gastrostomy status: Secondary | ICD-10-CM

## 2022-01-03 ENCOUNTER — Other Ambulatory Visit (INDEPENDENT_AMBULATORY_CARE_PROVIDER_SITE_OTHER): Payer: Self-pay | Admitting: Pediatrics

## 2022-01-03 DIAGNOSIS — Z931 Gastrostomy status: Secondary | ICD-10-CM

## 2022-01-04 ENCOUNTER — Encounter (INDEPENDENT_AMBULATORY_CARE_PROVIDER_SITE_OTHER): Payer: Self-pay

## 2022-01-04 ENCOUNTER — Telehealth (INDEPENDENT_AMBULATORY_CARE_PROVIDER_SITE_OTHER): Payer: Self-pay | Admitting: Dietician

## 2022-01-04 NOTE — Telephone Encounter (Signed)
°  Who's calling (name and relationship to patient) : Murvin Natal contact number:  Provider they see: Shirlee Limerick   Reason for call: Need to speak to Shirlee Limerick about  Jarid's feedings    PRESCRIPTION REFILL ONLY  Name of prescription:  Pharmacy:

## 2022-01-04 NOTE — Telephone Encounter (Signed)
°  Who's calling (name and relationship to patient) : Butch Penny; Private nurse  Best contact number: [phone]  Provider they see: Shirlee Limerick  Reason for call: Nurse has called in regarding New feed for [place]. She stated that Ensure complete was working okay for him. She also stated that he will be giving Ensure Complete boost, but its on back order. They are going to give him something else in place of it, but to give it she is going to need an order for anything given and how much to give. Nurse has requested call back.     PRESCRIPTION REFILL ONLY  Name of prescription:  Pharmacy:

## 2022-01-04 NOTE — Telephone Encounter (Signed)
Call to Ronie Spies nurse in the home. She needs new orders to cover the Ensure Plus and Boost Plus formulas. Mom obtained Ensure Complete and he has tolerated it for 3 days. [place] reports that it is only sold in stores and he would need the Ensure Plus or the Boost Plus. The Ensure Plus Vanilla is on Back order- So we completed a form for the Boost + and Ensure + and returned it to [place] today.  RN will have to determine if the water requirements etc remain the same. Once this is determined our office will send the order to [facility].  Also advised her that [employer] has been trying to reach mom about his Bi-Pap machine. She can call them at [phone] And set up a time for them to bring it out and set it up. She relayed information to mom while RN was on the phone.

## 2022-01-06 NOTE — Telephone Encounter (Signed)
Patient discussed with Judson Roch who is addressing issue with mother.   Carylon Perches MD MPH

## 2022-01-06 NOTE — Telephone Encounter (Signed)
Is stating that the changes in diet are causing gas. She states all in all he is okay but has a lot of gas.  Please call mom to discuss.

## 2022-01-10 ENCOUNTER — Other Ambulatory Visit: Payer: Self-pay | Admitting: Pediatrics

## 2022-01-10 ENCOUNTER — Telehealth (INDEPENDENT_AMBULATORY_CARE_PROVIDER_SITE_OTHER): Payer: Self-pay | Admitting: Dietician

## 2022-01-10 ENCOUNTER — Telehealth (INDEPENDENT_AMBULATORY_CARE_PROVIDER_SITE_OTHER): Payer: Self-pay | Admitting: Pediatrics

## 2022-01-10 ENCOUNTER — Other Ambulatory Visit (INDEPENDENT_AMBULATORY_CARE_PROVIDER_SITE_OTHER): Payer: Self-pay | Admitting: Pediatrics

## 2022-01-10 DIAGNOSIS — Z931 Gastrostomy status: Secondary | ICD-10-CM

## 2022-01-10 DIAGNOSIS — G40209 Localization-related (focal) (partial) symptomatic epilepsy and epileptic syndromes with complex partial seizures, not intractable, without status epilepticus: Secondary | ICD-10-CM

## 2022-01-10 DIAGNOSIS — R159 Full incontinence of feces: Secondary | ICD-10-CM

## 2022-01-10 DIAGNOSIS — K5900 Constipation, unspecified: Secondary | ICD-10-CM

## 2022-01-10 DIAGNOSIS — R143 Flatulence: Secondary | ICD-10-CM

## 2022-01-10 NOTE — Telephone Encounter (Signed)
°  Who's calling (name and relationship to patient) : Olivia Mackie Mother   Best contact number:  [phone]    Provider they see:[name]   Reason for call: mom stated that Lucas [name] is out of seizure med.     PRESCRIPTION REFILL ONLY  Name of prescription:  Pharmacy:

## 2022-01-10 NOTE — Telephone Encounter (Signed)
°  Who's calling (name and relationship to patient) :[name] contact number: [phone] Provider they see: Shirlee Limerick Reason for call:  Please contact mom because Will is hungry even after eating. Mom has made an appt for the GI, however its sometime in march and Kyndal is hungry now.    PRESCRIPTION REFILL ONLY  Name of prescription:  Pharmacy:

## 2022-01-11 NOTE — Telephone Encounter (Signed)
Call to [employer] [facility]- she reports they did receive the order for the dietitian and she is sending her a message to confirm whether or not she needs any further information or if they can take the referral. Asked about the Ensure + she is not sure when they are to receive the formula because all of the Ensure is on backorder. Call to [employer] spoke with Mikki Santee- he reports all Ensure products are on backorder. At this time they think they will be back in stock by [facility] Feb. RN asked what can the patient do to obtain the formula. He suggested contacting the [employer]. Call to the [place] store [phone] she reports they do have some in stock. A case of 24 Ensure + is $43.96  a case of the Ensure High Protein 24 is $39.96. RN asked if they have coupons they can send to families she recommended calling the [employer] Service. Call to Autumn at [facility] [phone]- she will send coupons to this RN for the family. She suggested trying to enroll in the Nutrition Resource Program but is not sure how that works for adult products in an office that is primarily Pediatrics.  Updates sent to Kayn's mom by my-chart   RN asked dietitian if he receives 4 cartons of Ensure Plus is that a lot more than he should receive. She reported he needs 1000 kcal/day and is receiving 1080 currently. If he receives 4 cartons he would receive 1440 Kcal and would gain too much weight, could cause stomach upset or vomiting if he does not absorb it. This information sent to mom as well.

## 2022-01-11 NOTE — Telephone Encounter (Signed)
Call to mom - she reports he is saying Hungry- RN advised he was doing that in our office the last visit too. She said the calorie content is 350 not 360 that RN calculated with which is correct- when looking at calorie content of the Ensure Complete and the Ensure Plus RN noted the Complete has 30 g of protein and the Plus 16 g. The lower protein could be why he is reporting hunger.  Mom thinks his feedings are too far apart but RN advised his old routine he went from afternoon to morning without any feeding. Adv RN put on the order recommended times but they can adjust. The recommendation was to allow for the water flushes between fdgs. Mom reports she purchased 3 cases of the Plus on [address]. RN advised she will contact [place] at [place] and see if they carry the higher protein Ensure. Mom requests RN respond by My Chart Message.  Call to [facility] Dietitian at [facility]. She said it should be ok to give him an extra carton a day a total of 4 a day. She will check on the higher protein formula and will follow up with the dietitian on setting up an assessment. She reports they have had a lot of issues with [employer] products and do not use them bc of the inability to keep them in stock. She will let staff know when the dietitian is set up with him and she will do a virtual visit and make recommendations. Once completed they can send samples for trial and then will send MD orders to sign. RN agrees with plan.

## 2022-01-11 NOTE — Telephone Encounter (Signed)
Message from his CAP-C case Mangers. Lynn's message:Met with  Blaine Hamper, the individual over CAP-DA in [place]. She said she is not aware of any cases where CAP-DA & PDN are involved at the same time. She said CAP-DA could not manage PDN Respite, so that is not an option. She suggest [facility]. Mom reports she wants nursing care so not sure what she will do but gathering info to put him on the list for [employer] Waiver.  SW: Etta Grandchild: Would he qualify for managed Medicaid once he is off CAP-C and on Innovations Waiver- RN replied he is not eligible with [employer] but could change to one of the managed medicaid plans.  Updated them on referral for adult dietitian at [facility].

## 2022-02-11 ENCOUNTER — Other Ambulatory Visit: Payer: Self-pay

## 2022-02-21 ENCOUNTER — Ambulatory Visit: Payer: Medicaid Other | Admitting: Gastroenterology

## 2022-03-03 ENCOUNTER — Other Ambulatory Visit: Payer: Self-pay

## 2022-03-03 ENCOUNTER — Observation Stay
Admission: EM | Admit: 2022-03-03 | Discharge: 2022-03-04 | Disposition: A | Discharge status: Home Health | Payer: Medicaid Other | Attending: Internal Medicine | Admitting: Internal Medicine

## 2022-03-03 ENCOUNTER — Emergency Department: Payer: Medicaid Other

## 2022-03-03 DIAGNOSIS — R000 Tachycardia, unspecified: Secondary | ICD-10-CM | POA: Diagnosis not present

## 2022-03-03 DIAGNOSIS — Z7189 Other specified counseling: Secondary | ICD-10-CM | POA: Diagnosis not present

## 2022-03-03 DIAGNOSIS — A419 Sepsis, unspecified organism: Secondary | ICD-10-CM

## 2022-03-03 DIAGNOSIS — R509 Fever, unspecified: Secondary | ICD-10-CM | POA: Diagnosis present

## 2022-03-03 DIAGNOSIS — Z20822 Contact with and (suspected) exposure to covid-19: Secondary | ICD-10-CM | POA: Insufficient documentation

## 2022-03-03 DIAGNOSIS — M24561 Contracture, right knee: Secondary | ICD-10-CM | POA: Diagnosis present

## 2022-03-03 DIAGNOSIS — Z79899 Other long term (current) drug therapy: Secondary | ICD-10-CM | POA: Insufficient documentation

## 2022-03-03 DIAGNOSIS — J189 Pneumonia, unspecified organism: Secondary | ICD-10-CM

## 2022-03-03 DIAGNOSIS — G7101 Duchenne or Becker muscular dystrophy: Secondary | ICD-10-CM

## 2022-03-03 DIAGNOSIS — F840 Autistic disorder: Secondary | ICD-10-CM | POA: Diagnosis not present

## 2022-03-03 DIAGNOSIS — J984 Other disorders of lung: Secondary | ICD-10-CM

## 2022-03-03 DIAGNOSIS — R569 Unspecified convulsions: Secondary | ICD-10-CM

## 2022-03-03 DIAGNOSIS — M24562 Contracture, left knee: Secondary | ICD-10-CM

## 2022-03-03 DIAGNOSIS — Z931 Gastrostomy status: Secondary | ICD-10-CM

## 2022-03-03 DIAGNOSIS — M4145 Neuromuscular scoliosis, thoracolumbar region: Secondary | ICD-10-CM

## 2022-03-03 DIAGNOSIS — G40209 Localization-related (focal) (partial) symptomatic epilepsy and epileptic syndromes with complex partial seizures, not intractable, without status epilepticus: Secondary | ICD-10-CM | POA: Diagnosis present

## 2022-03-03 DIAGNOSIS — M810 Age-related osteoporosis without current pathological fracture: Secondary | ICD-10-CM | POA: Diagnosis present

## 2022-03-03 HISTORY — DX: Sepsis, unspecified organism: A41.9

## 2022-03-03 HISTORY — DX: Pneumonia, unspecified organism: J18.9

## 2022-03-03 LAB — COMPREHENSIVE METABOLIC PANEL
ALT: 61 U/L — ABNORMAL HIGH (ref 0–44)
AST: 61 U/L — ABNORMAL HIGH (ref 15–41)
Albumin: 3.7 g/dL (ref 3.5–5.0)
Alkaline Phosphatase: 69 U/L (ref 38–126)
Anion gap: 11 (ref 5–15)
BUN: 7 mg/dL (ref 6–20)
CO2: 29 mmol/L (ref 22–32)
Calcium: 8.6 mg/dL — ABNORMAL LOW (ref 8.9–10.3)
Chloride: 95 mmol/L — ABNORMAL LOW (ref 98–111)
Creatinine, Ser: <0.3 mg/dL — ABNORMAL LOW (ref 0.61–1.24)
Glucose, Bld: 144 mg/dL — ABNORMAL HIGH (ref 70–99)
Potassium: 3.9 mmol/L (ref 3.5–5.1)
Sodium: 135 mmol/L (ref 135–145)
Total Bilirubin: 0.3 mg/dL (ref 0.3–1.2)
Total Protein: 7.6 g/dL (ref 6.5–8.1)

## 2022-03-03 LAB — CBC WITH DIFFERENTIAL/PLATELET
Abs Immature Granulocytes: 0.04 10*3/uL (ref 0.00–0.07)
Basophils Absolute: 0 10*3/uL (ref 0.0–0.1)
Basophils Relative: 0 %
Eosinophils Absolute: 0.2 10*3/uL (ref 0.0–0.5)
Eosinophils Relative: 3 %
HCT: 43.1 % (ref 39.0–52.0)
Hemoglobin: 14 g/dL (ref 13.0–17.0)
Immature Granulocytes: 1 %
Lymphocytes Relative: 7 %
Lymphs Abs: 0.4 10*3/uL — ABNORMAL LOW (ref 0.7–4.0)
MCH: 29.2 pg (ref 26.0–34.0)
MCHC: 32.5 g/dL (ref 30.0–36.0)
MCV: 90 fL (ref 80.0–100.0)
Monocytes Absolute: 1 10*3/uL (ref 0.1–1.0)
Monocytes Relative: 16 %
Neutro Abs: 4.3 10*3/uL (ref 1.7–7.7)
Neutrophils Relative %: 73 %
Platelets: 145 10*3/uL — ABNORMAL LOW (ref 150–400)
RBC: 4.79 MIL/uL (ref 4.22–5.81)
RDW: 13.5 % (ref 11.5–15.5)
WBC: 5.9 10*3/uL (ref 4.0–10.5)
nRBC: 0 % (ref 0.0–0.2)

## 2022-03-03 LAB — LACTIC ACID, PLASMA
Lactic Acid, Venous: 2.2 mmol/L (ref 0.5–1.9)
Lactic Acid, Venous: 0.7 mmol/L (ref 0.5–1.9)

## 2022-03-03 LAB — URINALYSIS, COMPLETE (UACMP) WITH MICROSCOPIC
Bacteria, UA: NONE SEEN
Bilirubin Urine: NEGATIVE
Glucose, UA: 150 mg/dL — AB
Hgb urine dipstick: NEGATIVE
Ketones, ur: NEGATIVE mg/dL
Leukocytes,Ua: NEGATIVE
Nitrite: NEGATIVE
Protein, ur: NEGATIVE mg/dL
Specific Gravity, Urine: 1.008 (ref 1.005–1.030)
Squamous Epithelial / HPF: NONE SEEN (ref 0–5)
pH: 7 (ref 5.0–8.0)

## 2022-03-03 LAB — PROTIME-INR
INR: 1.1 (ref 0.8–1.2)
Prothrombin Time: 14.1 seconds (ref 11.4–15.2)

## 2022-03-03 LAB — GROUP A STREP BY PCR: Group A Strep by PCR: NOT DETECTED

## 2022-03-03 LAB — PROCALCITONIN: Procalcitonin: <0.1 ng/mL

## 2022-03-03 LAB — RESP PANEL BY RT-PCR (FLU A&B, COVID) ARPGX2
Influenza A by PCR: NEGATIVE
Influenza B by PCR: NEGATIVE
SARS Coronavirus 2 by RT PCR: NEGATIVE

## 2022-03-03 LAB — APTT: aPTT: 31 seconds (ref 24–36)

## 2022-03-03 MED ORDER — ACETAMINOPHEN 325 MG PO TABS: 650.0000 mg | ORAL_TABLET | Freq: Four times a day (QID) | ORAL | Status: DC | PRN

## 2022-03-03 MED ORDER — LORAZEPAM 2 MG/ML IJ SOLN: 2.0000 mg | INTRAMUSCULAR | Status: DC | PRN

## 2022-03-03 MED ORDER — LACTATED RINGERS IV BOLUS (SEPSIS)
1000.0000 mL | Freq: Once | INTRAVENOUS | Status: AC
  Administered 2022-03-03: 1000 mL via INTRAVENOUS

## 2022-03-03 MED ORDER — SODIUM CHLORIDE 0.9 % IV SOLN
500.0000 mg | INTRAVENOUS | Status: DC
  Administered 2022-03-03 – 2022-03-04 (×2): 500 mg via INTRAVENOUS
  Filled 2022-03-03 (×2): qty 5

## 2022-03-03 MED ORDER — ONDANSETRON HCL 4 MG/2ML IJ SOLN: 4.0000 mg | Freq: Four times a day (QID) | INTRAMUSCULAR | Status: DC | PRN

## 2022-03-03 MED ORDER — LACTATED RINGERS IV SOLN: INTRAVENOUS | Status: AC

## 2022-03-03 MED ORDER — CARBAMAZEPINE 100 MG PO CHEW
150.0000 mg | CHEWABLE_TABLET | Freq: Once | ORAL | Status: AC
  Administered 2022-03-03: 150 mg
  Filled 2022-03-03: qty 1.5

## 2022-03-03 MED ORDER — ACETAMINOPHEN 160 MG/5ML PO SOLN
15.0000 mg/kg | Freq: Once | ORAL | Status: DC
  Filled 2022-03-03: qty 40.6

## 2022-03-03 MED ORDER — ACETAMINOPHEN 10 MG/ML IV SOLN
650.0000 mg | Freq: Four times a day (QID) | INTRAVENOUS | Status: DC
Start: 2022-03-04 — End: 2022-03-04
  Administered 2022-03-04: 650 mg via INTRAVENOUS
  Administered 2022-03-04: 1000 mg via INTRAVENOUS
  Administered 2022-03-04: 650 mg via INTRAVENOUS
  Filled 2022-03-03 (×4): qty 65

## 2022-03-03 MED ORDER — ONDANSETRON HCL 4 MG PO TABS: 4.0000 mg | ORAL_TABLET | Freq: Four times a day (QID) | ORAL | Status: DC | PRN

## 2022-03-03 MED ORDER — ACETAMINOPHEN 10 MG/ML IV SOLN
650.0000 mg | Freq: Once | INTRAVENOUS | Status: AC
  Administered 2022-03-03: 1000 mg via INTRAVENOUS
  Filled 2022-03-03 (×2): qty 65

## 2022-03-03 MED ORDER — IOHEXOL 300 MG/ML  SOLN
75.0000 mL | Freq: Once | INTRAMUSCULAR | Status: AC | PRN
  Administered 2022-03-03: 75 mL via INTRAVENOUS

## 2022-03-03 MED ORDER — SODIUM CHLORIDE 0.9 % IV SOLN: 2.0000 g | INTRAVENOUS | Status: DC

## 2022-03-03 MED ORDER — KETOROLAC TROMETHAMINE 15 MG/ML IJ SOLN
15.0000 mg | Freq: Four times a day (QID) | INTRAMUSCULAR | Status: DC | PRN
Start: 2022-03-03 — End: 2022-03-04
  Filled 2022-03-03: qty 1

## 2022-03-03 MED ORDER — SODIUM CHLORIDE 0.9 % IV SOLN: 500.0000 mg | INTRAVENOUS | Status: DC

## 2022-03-03 MED ORDER — ENOXAPARIN SODIUM 30 MG/0.3ML IJ SOSY: 30.0000 mg | PREFILLED_SYRINGE | Freq: Every day | INTRAMUSCULAR | Status: DC

## 2022-03-03 MED ORDER — ONDANSETRON HCL 4 MG/2ML IJ SOLN
4.0000 mg | Freq: Once | INTRAMUSCULAR | Status: AC
  Administered 2022-03-03: 4 mg via INTRAVENOUS
  Filled 2022-03-03: qty 2

## 2022-03-03 MED ORDER — SODIUM CHLORIDE 0.9 % IV SOLN
2.0000 g | INTRAVENOUS | Status: DC
  Administered 2022-03-03: 2 g via INTRAVENOUS
  Filled 2022-03-03: qty 20

## 2022-03-03 MED ORDER — LACTATED RINGERS IV BOLUS
1000.0000 mL | Freq: Once | INTRAVENOUS | Status: AC
  Administered 2022-03-03: 1000 mL via INTRAVENOUS

## 2022-03-03 MED ORDER — ACETAMINOPHEN 650 MG RE SUPP: 650.0000 mg | Freq: Four times a day (QID) | RECTAL | Status: DC | PRN

## 2022-03-03 NOTE — Assessment & Plan Note (Addendum)
-   N.p.o.  ?- Dietitian consulted to resume tube feeding. ?

## 2022-03-03 NOTE — ED Notes (Signed)
ED Provider at bedside. 

## 2022-03-03 NOTE — ED Provider Notes (Signed)
? ?[facility] ?Provider Note ? ? ? Event Date/Time  ? First MD Initiated Contact with Patient 03/03/22 1603   ?  (approximate) ? ? ?History  ? ?Fever ? ? ?HPI ? ?Lucas [name] is a 21 y.o. male who presents to the ED for evaluation of Fever ?  ?I reviewed peds cardiology visit from 1/31.  History of Duchenne's muscular dystrophy.  Seizure disorder.  Restrictive lung disease.  G-tube dependent for nearly all intake. ? ?Patient is brought to the ED via EMS from home for evaluation of fever and cough for the past few days.  Mom reports started with a fever on Monday, developing a wet-sounding cough in the past 2 days.  No known aspiration events.  No diarrhea or changes to his output.  No foul-smelling urine. ? ?Physical Exam  ? ?Triage Vital Signs: ?ED Triage Vitals  ?Enc Vitals Group  ?   BP   ?   Pulse   ?   Resp   ?   Temp   ?   Temp src   ?   SpO2   ?   Weight   ?   Height   ?   Head Circumference   ?   Peak Flow   ?   Pain Score   ?   Pain Loc   ?   Pain Edu?   ?   Excl. in [place]?   ? ? ?Most recent vital signs: ?Vitals:  ? 03/03/22 1915 03/03/22 1930  ?BP:    ?Pulse: (!) 135 (!) 136  ?Resp: (!) 34 (!) 23  ?Temp:    ?SpO2: 97% 97%  ? ? ?General: Awake.  Nonverbal.  Frequent wet-sounding cough ?CV:  Good peripheral perfusion.  Tachycardic and regular. ?Resp:  Normal effort.  Tachypneic with wet cough ?Abd:  No distention.  G-tube with clear insertion site.  Soft abdomen. ?MSK:  No deformity noted.  ?Neuro:  No focal deficits appreciated. ?Other:   ? ? ?ED Results / Procedures / Treatments  ? ?Labs ?(all labs ordered are listed, but only abnormal results are displayed) ?Labs Reviewed  ?LACTIC ACID, PLASMA - Abnormal; Notable for the following components:  ?    Result Value  ? Lactic Acid, Venous 2.2 (*)   ? All other components within normal limits  ?COMPREHENSIVE METABOLIC PANEL - Abnormal; Notable for the following components:  ? Chloride 95 (*)   ? Glucose, Bld 144 (*)   ? Creatinine, Ser  <0.30 (*)   ? Calcium 8.6 (*)   ? AST 61 (*)   ? ALT 61 (*)   ? All other components within normal limits  ?CBC WITH DIFFERENTIAL/PLATELET - Abnormal; Notable for the following components:  ? Platelets 145 (*)   ? Lymphs Abs 0.4 (*)   ? All other components within normal limits  ?URINALYSIS, COMPLETE (UACMP) WITH MICROSCOPIC - Abnormal; Notable for the following components:  ? Color, Urine STRAW (*)   ? APPearance CLEAR (*)   ? Glucose, UA 150 (*)   ? All other components within normal limits  ?RESP PANEL BY RT-PCR (FLU A&B, COVID) [id number]  ?GROUP A STREP BY PCR  ?CULTURE, BLOOD (ROUTINE X 2)  ?CULTURE, BLOOD (ROUTINE X 2)  ?URINE CULTURE  ?LACTIC ACID, PLASMA  ?PROTIME-INR  ?APTT  ?PROCALCITONIN  ?PROCALCITONIN  ? ? ?EKG ?Sinus tachycardia with a rate of 151 bpm.  Normal axis.  Left anterior fascicular block and otherwise normal intervals.  No ischemic  features. ? ?RADIOLOGY ?1 view CXR today reviewed by me me without evidence of acute cardiopulmonary pathology ?CT chest abdomen pelvis with evidence of LLL pneumonia, reviewed by me ? ?Official radiology report(s): ?CT CHEST ABDOMEN PELVIS W CONTRAST ? ?Result Date: 03/03/2022 ?CLINICAL DATA:  Septic, tense abdomen, febrile, cough and congestion for 3 days, history of Duchenne muscular dystrophy EXAM: CT CHEST, ABDOMEN, AND PELVIS WITH CONTRAST TECHNIQUE: Multidetector CT imaging of the chest, abdomen and pelvis was performed following the standard protocol during bolus administration of intravenous contrast. RADIATION DOSE REDUCTION: This exam was performed according to the departmental dose-optimization program which includes automated exposure control, adjustment of the mA and/or kV according to patient size and/or use of iterative reconstruction technique. CONTRAST:  30mL OMNIPAQUE IOHEXOL 300 MG/ML  SOLN COMPARISON:  03/03/2022 FINDINGS: CT CHEST FINDINGS Cardiovascular: The heart and great vessels are unremarkable without pericardial effusion. No evidence of  thoracic aortic aneurysm or dissection. Mediastinum/Nodes: No enlarged mediastinal, hilar, or axillary lymph nodes. Thyroid gland, trachea, and esophagus demonstrate no significant findings. Lungs/Pleura: Dependent left lower lobe airspace disease could reflect sequela of infection or aspiration. Trace right pleural effusion. No pneumothorax. Central airways are patent. Musculoskeletal: There is diffuse muscular atrophy consistent with history of muscular dystrophy. Disuse osteopenia is identified. Postsurgical changes from prior spinal fusion. No acute bony abnormalities. Reconstructed images demonstrate no additional findings. CT ABDOMEN PELVIS FINDINGS Hepatobiliary: No focal liver abnormality is seen. No gallstones, gallbladder wall thickening, or biliary dilatation. Pancreas: Unremarkable. No pancreatic ductal dilatation or surrounding inflammatory changes. Spleen: Normal in size without focal abnormality. Adrenals/Urinary Tract: 3 mm nonobstructing calculus lower pole right kidney. No obstructive uropathy within either kidney. The kidneys enhance normally and symmetrically. The adrenals and bladder are unremarkable. Stomach/Bowel: Percutaneous gastrostomy tube within the gastric lumen. No bowel obstruction or ileus. Normal appendix right lower quadrant. No bowel wall thickening or inflammatory change. Vascular/Lymphatic: No significant vascular findings are present. No enlarged abdominal or pelvic lymph nodes. Reproductive: Prostate is unremarkable. Other: No free fluid or free intraperitoneal gas. No abdominal wall hernia. Musculoskeletal: Diffuse muscular atrophy consistent with history of muscular dystrophy. There are no acute bony abnormalities. Postsurgical changes from spinal fusion. Bones are diffusely osteopenic. Reconstructed images demonstrate no additional findings. IMPRESSION: 1. Dependent left lower lobe airspace disease consistent with infection or aspiration pneumonia. 2. Trace right pleural  effusion. 3. Nonobstructing 3 mm right renal calculus. 4. Diffuse muscular atrophy consistent with history of muscular dystrophy. 5. Peg tube within the gastric lumen. Electronically Signed   By: Randa Ngo M.D.   On: 03/03/2022 19:38  ? ?DG Chest Port 1 View ? ?Result Date: 03/03/2022 ?CLINICAL DATA:  Febrile. Muscular dystrophy patient. Tachypnea again coughing. EXAM: PORTABLE CHEST 1 VIEW COMPARISON:  AP chest 06/05/2020 FINDINGS: Cardiac silhouette is again moderately enlarged. Mediastinal contours are within normal limits. There again moderately decreased lung volumes. Associated mild bronchovascular crowding. No focal airspace opacity is seen to indicate pneumonia. No pleural effusion or pneumothorax. Extensive partially visualized thoracic and lumbar bilateral transpedicular rod and screw fusion hardware. Mild-to-moderate dextrocurvature of the mid to lower thoracic spine. IMPRESSION: Moderately decreased lung volumes.  No acute lung process. Electronically Signed   By: Yvonne Kendall M.D.   On: 03/03/2022 17:23   ? ?PROCEDURES and INTERVENTIONS: ? ?.1-3 Lead EKG Interpretation ?Performed by: Vladimir Crofts, MD ?Authorized by: Vladimir Crofts, MD  ? ?  Interpretation: abnormal   ?  ECG rate:  130 ?  ECG rate assessment: tachycardic   ?  Rhythm: sinus tachycardia   ?  Ectopy: none   ?  Conduction: normal   ?.Critical Care ?Performed by: Vladimir Crofts, MD ?Authorized by: Vladimir Crofts, MD  ? ?Critical care provider statement:  ?  Critical care time (minutes):  30 ?  Critical care time was exclusive of:  Separately billable procedures and treating other patients ?  Critical care was necessary to treat or prevent imminent or life-threatening deterioration of the following conditions:  Shock ?  Critical care was time spent personally by me on the following activities:  Development of treatment plan with patient or surrogate, discussions with consultants, evaluation of patient's response to treatment, examination of  patient, ordering and review of laboratory studies, ordering and review of radiographic studies, ordering and performing treatments and interventions, pulse oximetry, re-evaluation of patient's condition and re

## 2022-03-03 NOTE — Assessment & Plan Note (Signed)
-   Patient takes Carbamazepine 150 mg chewable tablet per tube twice daily, evening dose has been ordered in the ED ?- Ativan 2 mg IV as needed for seizures, 2 doses ordered, with instructions to administer and then let provider know ?- Aspiration precautions ?

## 2022-03-03 NOTE — ED Notes (Signed)
This RN to bedside to reassess pt's temp, noticed pt's abdomen seemed distended. This RN asked family if this was pt's baseline, they reported it was not. Dr. Tamala Julian notified pt's current VS, about pt's abdomen distention, and family's concern. ?

## 2022-03-03 NOTE — Consult Note (Signed)
CODE SEPSIS - PHARMACY COMMUNICATION ? ?**Broad Spectrum Antibiotics should be administered within 1 hour of Sepsis diagnosis** ? ?Time Code Sepsis Called/Page Received: 1949 ? ?Antibiotics Ordered: Rocephin, Azithromycin ? ?Time of 1st antibiotic administration: 2017 ? ?Additional action taken by pharmacy: none ? ?If necessary, Name of Provider/Nurse Contacted: n/a ? ? ? ?Pearla Dubonnet ,PharmD ?Clinical Pharmacist  ?03/03/2022  8:18 PM ? ?

## 2022-03-03 NOTE — ED Notes (Signed)
Dr. Cox at bedside.  

## 2022-03-03 NOTE — ED Notes (Signed)
Pt in CT.

## 2022-03-03 NOTE — Hospital Course (Addendum)
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 ? ?Initial vitals in the emergency department showed Tmax of 102.5, respiration rate of 26, heart rate of 140, blood pressure 113/78, SPO2 100% on room air. ? ?Serum sodium 135, potassium 3.9, chloride 95, bicarb 29, BUN of 7, serum creatinine of less than 0.3, nonfasting blood glucose 144, EGFR not able to be Calculated. ? ?WBC was not elevated at 5.9, hemoglobin 14, platelets of 145. ? ?AST was 61 ALT 61.  Lactic acid elevated at 2.2 and improved to 0.7. ? ?Procalcitonin was less than 0.10. ? ?COVID/influenza a/influenza B PCR were negative. ? ?UA was negative for leukocytes and nitrates.  Group strep a was negative. ? ?Blood cultures x2 are negative so far, urine culture is pending. ? ?CT of the chest abdomen pelvis with contrast was read as dependent left lower lobe airspace disease consistent with infection or aspiration pneumonia.  Trace right pleural effusion.  Nonobstructing 3 mm right Renal calculus.  Peg tube within gastric lumen.  Diffuse muscular atrophy consistent with history of muscular dystrophy. ? ?ED treatment: IV Tylenol, azithromycin 500 mg daily, 5 days ordered, ceftriaxone 2 g IV daily, 5 days ordered, LR bolus, total of 2 L ordered, LR IVF at 150 mL/h. ? ?3/24: Patient improving, procalcitonin remain negative.  Some electrolyte derangements which include hypokalemia and borderline magnesium which is being repleted.  Can be a viral infection, checking respiratory viral panel.  Discontinue ceftriaxone and continuing Zithromax at this time. ? ?Likely be discharged tomorrow if remains stable. ?

## 2022-03-03 NOTE — Sepsis Progress Note (Signed)
Elink following Code Sepsis. 

## 2022-03-03 NOTE — Assessment & Plan Note (Addendum)
Presumed secondary to sepsis presentation from pneumonia ?Treat as above ? Patient had outpatient [facility] pediatric cardiologist evaluation on 01/11/2022 for tachycardia.  Echo was performed and evaluated which showed normal chamber size, wall thickness, normal squeezing ?

## 2022-03-03 NOTE — ED Triage Notes (Signed)
Pt presents to ED [facility] for c/o of being febrile and having a cough and congestion for the past 3 days.  ?

## 2022-03-03 NOTE — Assessment & Plan Note (Addendum)
-   Complicated by restrictive lung disease in setting of Duchenne muscular dystrophy ?- Incentive spirometry and flutter valve were considered however patient minimally follows commands ?-Can be of viral etiology as procalcitonin remain negative ?

## 2022-03-03 NOTE — Assessment & Plan Note (Signed)
-   Continue outpatient follow-up ?

## 2022-03-03 NOTE — Assessment & Plan Note (Addendum)
-   Presumed secondary to left lower lobe pneumonia ?- Tmax 102.5, respiration rate 26, heart rate 140 ?Patient received IV fluid and antibiotics per sepsis protocol. ?Blood cultures negative so far, urine cultures pending.  Procalcitonin remain negative in 2 days.  Can be a viral etiology.  COVID-19 and flu  Negative ?-Check respiratory viral panel ?-Stop ceftriaxone ?-Continue with Zithromax ?-Continue with supportive care. ?

## 2022-03-03 NOTE — ED Notes (Signed)
Unable to access pt's peg tube, recommended to pt's family and home nurse to bring connector from home. Requesting rectal tylenol from Dr. Tamala Julian. ?

## 2022-03-03 NOTE — ED Notes (Signed)
Date and time results received: 03/03/22 1711 ?(use smartphrase ".now" to insert current time) ? ?Test: lactic acid ?Critical Value: 2.2 ? ?Name of Provider Notified: Dr. Tamala Julian ? ?Orders Received? Or Actions Taken?: none, will continue to monitor. ?

## 2022-03-03 NOTE — ED Notes (Signed)
Pt's linens, brief, and chucks changed. External catheter applied. ?

## 2022-03-03 NOTE — H&P (Addendum)
- Incentive spirometry and flutter valve were considered however patient- History and Physical   Lucas [name] NWG:[id number] DOB: Sep 14, 2001 DOA: 03/03/2022  PCP: Kandyce Rud, MD  Outpatient Specialists: Dr. Rosiland Oz, [facility] children's cardiologist Patient coming from: home via EMS  I have personally briefly reviewed patient's old medical records in [facility] EMR.  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  Initial vitals in the emergency department showed Tmax of 102.5, respiration rate of 26, heart rate of 140, blood pressure 113/78, SPO2 100% on room air.  Serum sodium 135, potassium 3.9, chloride 95, bicarb 29, BUN of 7, serum creatinine of less than 0.3, nonfasting blood glucose 144, EGFR not able to be Calculated.  WBC was not elevated at 5.9, hemoglobin 14, platelets of 145.  AST was 61 ALT 61.  Lactic acid elevated at 2.2 and improved to 0.7.  Procalcitonin was less than 0.10.  COVID/influenza a/influenza B PCR were negative.  UA was negative for leukocytes and nitrates.  Group strep a was negative.  Blood cultures x2 are collected and pending.  Your urine culture is pending.  CT of the chest abdomen pelvis with contrast was read as dependent left lower lobe airspace disease consistent with infection or aspiration pneumonia.  Trace right pleural effusion.  Nonobstructing 3 mm right Renal calculus.  Peg tube within gastric lumen.  Diffuse muscular atrophy consistent with history of muscular dystrophy.  ED treatment: IV Tylenol, azithromycin 500 mg daily, 5 days ordered, ceftriaxone 2 g IV daily, 5 days ordered, LR  bolus, total of 2 L ordered, LR IVF at 150 mL/h. ------ At bedside patient is nonverbal.  He is using his tongue to push away his mask.  He is coughing.  He does not appear to be in acute distress.  He does have bilateral upper and lower extremity contractures and muscle atrophy.  Family was not at bedside.  Social history: Patient lives at home with mother and has a home health aide for assistance.  Vaccination history: Unknown   ROS: Unable to complete as patient is nonverbal  ED Course: Discussed with emergency medicine provider, patient requiring hospitalization for chief concerns of sepsis.  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)   Feeding by G-tube (HCC)   Osteoporosis   Contractures involving both knees   Sinus tachycardia   Left lower lobe pneumonia   Assessment and Plan:  * Sepsis (HCC) - Presumed secondary to left lower lobe pneumonia - Tmax 102.5, respiration rate 26, heart rate 140 - Continue ceftriaxone 2 g, azithromycin - Procalcitonin within normal limits in the emergency department - Blood cultures x2 collected and are in process - UA was negative for nitrates and leukocytes - Urine culture collected and are in process - Patient status post lactated ringer total of 2 L ordered - LR IVF at 150  mL/h ordered  Left lower lobe pneumonia - Complicated by restrictive lung disease in setting of Duchenne muscular dystrophy - Incentive spirometry and flutter valve were considered however patient minimally follows commands  Sinus tachycardia - Presumed secondary  to sepsis presentation from pneumonia - Treat as above - Patient had outpatient [facility] pediatric cardiologist evaluation on 01/11/2022 for tachycardia.  Echo was performed and evaluated which showed normal chamber size, wall thickness, normal squeezing  Feeding by G-tube (HCC) - N.p.o.  - Dietitian consulted  Seizure [facility]) - Patient takes Carbamazepine 150 mg chewable tablet per tube twice daily, evening dose has been ordered in the ED - Ativan 2 mg IV as needed for seizures, 2 doses ordered, with instructions to administer and then let provider know - Aspiration precautions  Duchenne muscular dystrophy (HCC) - Continue outpatient follow-up  Abdomen mild to moderately firm, however patient does not elicit pain with palpation.  CT chest abdomen and pelvis with contrast performed in the ED and was read as negative for obstruction, ileus, normal appendix right lower quadrant, no bowel wall thickening or inflammatory change.  No free fluid or free intraperitoneal gas, no abdominal wall hernia. Chart reviewed.   DVT prophylaxis: Enoxaparin Code Status: Full code Diet: N.p.o. Family Communication: No family was at bedside.  Nursing confirmed that mother and patient's care aide has left the ED room.  Attempted to call mother at [phone], no pickup.  Phone kept ringing and there was no prompt for voicemail. Disposition Plan: Pending clinical course Consults called: None at this time Admission status: Progressive, inpatient  Past Medical History:  Diagnosis Date   Autism    Autism    Diarrhea 06/09/2015   Family history of adverse reaction to anesthesia    MGGM- N/V   Muscular dystrophy (HCC)    Scoliosis    Seizures (HCC)    Last one 2013   Transient alteration of awareness    Viral gastroenteritis    Yeast infection of the skin 10/24/2020   Past Surgical History:  Procedure Laterality Date   CIRCUMCISION     at birth   LAPAROSCOPIC GASTROSTOMY N/A 05/24/2017   Procedure:  LAPAROSCOPIC GASTROSTOMY TUBE PLACEMENT;  Surgeon: Kandice Hams, MD;  Location: [name];  Service: General;  Laterality: N/A;   SPINE SURGERY N/A    Phreesia 06/29/2020   Social History:  reports that he has never smoked. He has been exposed to tobacco smoke. He has never used smokeless tobacco. He reports that he does not drink alcohol and does not use drugs.  Allergies  Allergen Reactions   Penicillins Hives and Rash    Has patient had a PCN reaction causing immediate rash, facial/tongue/throat swelling, SOB or lightheadedness with hypotension: Yes Has patient had a PCN reaction causing severe rash involving mucus membranes or skin necrosis: Yes Has patient had a PCN reaction that required hospitalization: No Has patient had a PCN reaction occurring within the last 10 years: No If all of the above answers are "NO", then may proceed with Cephalosporin use.    Docusate Sodium     Rash and cant sleep   Multivitamins     Red in face, itchy, bumps   Other Other (See Comments)    [place]kot -- gets rash and cant sleep   Calcitonin Rash   Esomeprazole Magnesium Nausea And Vomiting   Omeprazole Other (See Comments)    Constipation and possible rash after drug was stopped   Simethicone Palpitations and Rash   Sunflower Oil  Itching, Rash and Other (See Comments)    GI upset - Avoid products containing this additive   Vitamin D Analogs Other (See Comments)    Rash on face if it contains sunflower seed oil Excessive urine output and thirsty, increase in gas   Family History  Problem Relation Age of Onset   Cancer Paternal Grandfather        died at age 43   Dementia Paternal Grandmother        died at age 62   Other Mother        in special education, a carrier for Duchenne Muscular Dystrophy   Hyperlipidemia Mother    Learning disabilities Mother    Other Maternal Uncle        Duchenne Muscular Dystrophy, was in special educations   Other Maternal Aunt        was in special education    Other Maternal Grandmother        a carrier for Duchenne Muscular Dystrophy/Died due to septic shock at 21 years old    Heart failure Maternal Grandmother    Hypertension Maternal Grandmother    Alcohol abuse Father    Asthma Other    Hyperlipidemia Other    Hypertension Other    Vision loss Other    Family history: Family history reviewed and pertinent mother with carrier gene for Duchenne muscular dystrophy and maternal uncle with Duchenne muscular dystrophy, maternal grandmother carrier for Duchenne muscular dystrophy.  Prior to Admission medications   Medication Sig Start Date End Date Taking? Authorizing Provider  carbamazepine (TEGRETOL) 100 MG chewable tablet TAKE 1 & 1/2 TABLETS BY MOUTH TWICE A DAY 01/10/22   Margurite Auerbach, MD  diphenhydrAMINE (BENADRYL) 12.5 MG/5ML elixir 5 -10 ml po as needed for hives 3 times a day Patient not taking: No sig reported 05/07/19   [provider]  levalbuterol Pauline Aus) 1.25 MG/3ML nebulizer solution Take 1.25 mg by nebulization every 4 (four) hours as needed for wheezing. Patient not taking: Reported on 11/18/2021 06/18/21   Kalman Jewels, MD  Nutritional Supplements (NUTRITIONAL SUPPLEMENT PLUS) LIQD 3 cartons (888 mL) Ensure Complete given by tube daily. 12/27/21   Margurite Auerbach, MD  nystatin (MYCOSTATIN/NYSTOP) powder Apply 1 application topically 3 (three) times daily. Apply light coat to rash inaffected area 3 times per day 11/18/21   Margurite Auerbach, MD  nystatin-triamcinolone ointment [facility]) Apply 1 application topically 2 (two) times daily. Apply to affected area 11/18/21   Margurite Auerbach, MD  polyethylene glycol powder (GOODSENSE CLEARLAX) 17 GM/SCOOP powder GIVE 17 GM BY TUBE EVERY DAY 11/18/21   Margurite Auerbach, MD  prednisoLONE (ORAPRED) 15 MG/5ML solution PLACE 3 ML INTO FEEDING TUBE DAILY 11/18/21   Margurite Auerbach, MD  triamcinolone cream (KENALOG) 0.1 % Apply thin layer to rash on leg twice per day  10/09/20   [provider]   Physical Exam: Vitals:   03/03/22 2045 03/03/22 2100 03/03/22 2152 03/03/22 2204  BP:  106/78 (!) 130/98   Pulse: (!) 134 (!) 134 (!) 133   Resp: (!) 27 17 20    Temp:   98.2 F (36.8 C)   TempSrc:   Oral   SpO2: 97% 97% 97%   Weight:    47.8 kg  Height:    5' (1.524 m)   Constitutional: appears age-appropriate, NAD, calm, comfortable Eyes: PERRL, lids and conjunctivae normal ENMT: Mucous membranes are moist. Posterior pharynx clear of any exudate or lesions. Age-appropriate dentition.  Unable to assess hearing Neck: normal, supple, no masses, no thyromegaly Respiratory: Decreased lung sounds on posterior right lower lobe.  Decreased respiratory effort. No accessory muscle use.  Cardiovascular: Regular rate and rhythm, no murmurs / rubs / gallops. No extremity edema. 2+ pedal pulses. No carotid bruits.  Abdomen: no tenderness, no masses palpated, no hepatosplenomegaly. Bowel sounds positive.  Feeding tube in place, does not appear to be infected. Musculoskeletal: no clubbing / cyanosis. No joint deformity upper and lower extremities. Good ROM, no contractures, no atrophy. Normal muscle tone.  Skin: no rashes, lesions, ulcers. No induration Neurologic: Sensation intact.  Unable to assess strength.  Patient opens eyes spontaneously.  Patient does open his mouth and uses his tongue to push out his mask. Psychiatric: Normal judgment and insight.  Patient is awake and opens eyes spontaneously.  Normal mood.   EKG: independently reviewed, showing sinus tachycardia with rate of 151, QTc 420  Chest x-ray on Admission: I personally reviewed and I agree with radiologist reading as below.  CT CHEST ABDOMEN PELVIS W CONTRAST  Result Date: 03/03/2022 CLINICAL DATA:  Septic, tense abdomen, febrile, cough and congestion for 3 days, history of Duchenne muscular dystrophy EXAM: CT CHEST, ABDOMEN, AND PELVIS WITH CONTRAST TECHNIQUE: Multidetector CT imaging of the  chest, abdomen and pelvis was performed following the standard protocol during bolus administration of intravenous contrast. RADIATION DOSE REDUCTION: This exam was performed according to the departmental dose-optimization program which includes automated exposure control, adjustment of the mA and/or kV according to patient size and/or use of iterative reconstruction technique. CONTRAST:  75mL OMNIPAQUE IOHEXOL 300 MG/ML  SOLN COMPARISON:  03/03/2022 FINDINGS: CT CHEST FINDINGS Cardiovascular: The heart and great vessels are unremarkable without pericardial effusion. No evidence of thoracic aortic aneurysm or dissection. Mediastinum/Nodes: No enlarged mediastinal, hilar, or axillary lymph nodes. Thyroid gland, trachea, and esophagus demonstrate no significant findings. Lungs/Pleura: Dependent left lower lobe airspace disease could reflect sequela of infection or aspiration. Trace right pleural effusion. No pneumothorax. Central airways are patent. Musculoskeletal: There is diffuse muscular atrophy consistent with history of muscular dystrophy. Disuse osteopenia is identified. Postsurgical changes from prior spinal fusion. No acute bony abnormalities. Reconstructed images demonstrate no additional findings. CT ABDOMEN PELVIS FINDINGS Hepatobiliary: No focal liver abnormality is seen. No gallstones, gallbladder wall thickening, or biliary dilatation. Pancreas: Unremarkable. No pancreatic ductal dilatation or surrounding inflammatory changes. Spleen: Normal in size without focal abnormality. Adrenals/Urinary Tract: 3 mm nonobstructing calculus lower pole right kidney. No obstructive uropathy within either kidney. The kidneys enhance normally and symmetrically. The adrenals and bladder are unremarkable. Stomach/Bowel: Percutaneous gastrostomy tube within the gastric lumen. No bowel obstruction or ileus. Normal appendix right lower quadrant. No bowel wall thickening or inflammatory change. Vascular/Lymphatic: No  significant vascular findings are present. No enlarged abdominal or pelvic lymph nodes. Reproductive: Prostate is unremarkable. Other: No free fluid or free intraperitoneal gas. No abdominal wall hernia. Musculoskeletal: Diffuse muscular atrophy consistent with history of muscular dystrophy. There are no acute bony abnormalities. Postsurgical changes from spinal fusion. Bones are diffusely osteopenic. Reconstructed images demonstrate no additional findings. IMPRESSION: 1. Dependent left lower lobe airspace disease consistent with infection or aspiration pneumonia. 2. Trace right pleural effusion. 3. Nonobstructing 3 mm right renal calculus. 4. Diffuse muscular atrophy consistent with history of muscular dystrophy. 5. Peg tube within the gastric lumen. Electronically Signed   By: Sharlet Salina M.D.   On: 03/03/2022 19:38   DG Chest Port 1 View  Result Date: 03/03/2022 CLINICAL DATA:  Febrile. Muscular dystrophy patient. Tachypnea again coughing. EXAM: PORTABLE CHEST 1 VIEW COMPARISON:  AP chest 06/05/2020 FINDINGS: Cardiac silhouette is again moderately enlarged. Mediastinal contours are within normal limits. There again moderately decreased lung volumes. Associated mild bronchovascular crowding. No focal airspace opacity is seen to indicate pneumonia. No pleural effusion or pneumothorax. Extensive partially visualized thoracic and lumbar bilateral transpedicular rod and screw fusion hardware. Mild-to-moderate dextrocurvature of the mid to lower thoracic spine. IMPRESSION: Moderately decreased lung volumes.  No acute lung process. Electronically Signed   By: Neita Garnet M.D.   On: 03/03/2022 17:23    Labs on Admission: I have personally reviewed following labs  CBC: Recent Labs  Lab 03/03/22 1613  WBC 5.9  NEUTROABS 4.3  HGB 14.0  HCT 43.1  MCV 90.0  PLT 145*   Basic Metabolic Panel: Recent Labs  Lab 03/03/22 1613  NA 135  K 3.9  CL 95*  CO2 29  GLUCOSE 144*  BUN 7  CREATININE <0.30*   CALCIUM 8.6*   Liver Function Tests: Recent Labs  Lab 03/03/22 1613  AST 61*  ALT 61*  ALKPHOS 69  BILITOT 0.3  PROT 7.6  ALBUMIN 3.7   Coagulation Profile: Recent Labs  Lab 03/03/22 1613  INR 1.1   Urine analysis:    Component Value Date/Time   COLORURINE STRAW (A) 03/03/2022 1754   APPEARANCEUR CLEAR (A) 03/03/2022 1754   APPEARANCEUR Clear 06/08/2014 1957   LABSPEC 1.008 03/03/2022 1754   LABSPEC 1.019 06/08/2014 1957   PHURINE 7.0 03/03/2022 1754   GLUCOSEU 150 (A) 03/03/2022 1754   GLUCOSEU Negative 06/08/2014 1957   HGBUR NEGATIVE 03/03/2022 1754   BILIRUBINUR NEGATIVE 03/03/2022 1754   BILIRUBINUR Negative 06/08/2014 1957   KETONESUR NEGATIVE 03/03/2022 1754   PROTEINUR NEGATIVE 03/03/2022 1754   NITRITE NEGATIVE 03/03/2022 1754   LEUKOCYTESUR NEGATIVE 03/03/2022 1754   LEUKOCYTESUR Negative 06/08/2014 1957   CRITICAL CARE Performed by: Nadyne Coombes Tunis Gentle  Total critical care time: 35 minutes  Critical care time was exclusive of separately billable procedures and treating other patients.  Critical care was necessary to treat or prevent imminent or life-threatening deterioration.  Critical care was time spent personally by me on the following activities: development of treatment plan with patient and/or surrogate as well as nursing, discussions with consultants, evaluation of patient's response to treatment, examination of patient, obtaining history from patient or surrogate, ordering and performing treatments and interventions, ordering and review of laboratory studies, ordering and review of radiographic studies, pulse oximetry and re-evaluation of patient's condition.  Dr. Sedalia Muta [facility]  If 7PM-7AM, please contact overnight-coverage provider If 7AM-7PM, please contact day coverage provider [email or web address]  03/03/2022, 10:48 PM

## 2022-03-04 DIAGNOSIS — Z931 Gastrostomy status: Secondary | ICD-10-CM | POA: Diagnosis not present

## 2022-03-04 DIAGNOSIS — J189 Pneumonia, unspecified organism: Secondary | ICD-10-CM | POA: Diagnosis not present

## 2022-03-04 DIAGNOSIS — A419 Sepsis, unspecified organism: Secondary | ICD-10-CM | POA: Diagnosis not present

## 2022-03-04 DIAGNOSIS — G7101 Duchenne or Becker muscular dystrophy: Secondary | ICD-10-CM | POA: Diagnosis not present

## 2022-03-04 LAB — CBC WITH DIFFERENTIAL/PLATELET
Abs Immature Granulocytes: 0.03 10*3/uL (ref 0.00–0.07)
Basophils Absolute: 0 10*3/uL (ref 0.0–0.1)
Basophils Relative: 0 %
Eosinophils Absolute: 0 10*3/uL (ref 0.0–0.5)
Eosinophils Relative: 0 %
HCT: 35.9 % — ABNORMAL LOW (ref 39.0–52.0)
Hemoglobin: 12.3 g/dL — ABNORMAL LOW (ref 13.0–17.0)
Immature Granulocytes: 1 %
Lymphocytes Relative: 21 %
Lymphs Abs: 0.8 10*3/uL (ref 0.7–4.0)
MCH: 30 pg (ref 26.0–34.0)
MCHC: 34.3 g/dL (ref 30.0–36.0)
MCV: 87.6 fL (ref 80.0–100.0)
Monocytes Absolute: 0.7 10*3/uL (ref 0.1–1.0)
Monocytes Relative: 18 %
Neutro Abs: 2.4 10*3/uL (ref 1.7–7.7)
Neutrophils Relative %: 60 %
Platelets: 126 10*3/uL — ABNORMAL LOW (ref 150–400)
RBC: 4.1 MIL/uL — ABNORMAL LOW (ref 4.22–5.81)
RDW: 13.5 % (ref 11.5–15.5)
WBC: 3.9 10*3/uL — ABNORMAL LOW (ref 4.0–10.5)
nRBC: 0 % (ref 0.0–0.2)

## 2022-03-04 LAB — BASIC METABOLIC PANEL
Anion gap: 8 (ref 5–15)
BUN: 5 mg/dL — ABNORMAL LOW (ref 6–20)
CO2: 31 mmol/L (ref 22–32)
Calcium: 8.4 mg/dL — ABNORMAL LOW (ref 8.9–10.3)
Chloride: 98 mmol/L (ref 98–111)
Creatinine, Ser: <0.3 mg/dL — ABNORMAL LOW (ref 0.61–1.24)
Glucose, Bld: 87 mg/dL (ref 70–99)
Potassium: 3.2 mmol/L — ABNORMAL LOW (ref 3.5–5.1)
Sodium: 137 mmol/L (ref 135–145)

## 2022-03-04 LAB — PROCALCITONIN: Procalcitonin: <0.1 ng/mL

## 2022-03-04 LAB — RESPIRATORY PANEL BY PCR

## 2022-03-04 LAB — MAGNESIUM: Magnesium: 1.7 mg/dL (ref 1.7–2.4)

## 2022-03-04 LAB — CORTISOL-AM, BLOOD: Cortisol - AM: 26.5 ug/dL — ABNORMAL HIGH (ref 6.7–22.6)

## 2022-03-04 LAB — PROTIME-INR
INR: 1.1 (ref 0.8–1.2)
Prothrombin Time: 14.4 seconds (ref 11.4–15.2)

## 2022-03-04 LAB — HIV ANTIBODY (ROUTINE TESTING W REFLEX): HIV Screen 4th Generation wRfx: NONREACTIVE

## 2022-03-04 LAB — PHOSPHORUS: Phosphorus: 3.5 mg/dL (ref 2.5–4.6)

## 2022-03-04 MED ORDER — CARBAMAZEPINE 100 MG PO CHEW
150.0000 mg | CHEWABLE_TABLET | Freq: Two times a day (BID) | ORAL | Status: DC
Start: 2022-03-04 — End: 2022-03-04
  Administered 2022-03-04: 150 mg
  Filled 2022-03-04: qty 1.5

## 2022-03-04 MED ORDER — POTASSIUM CHLORIDE 20 MEQ PO PACK
40.0000 meq | PACK | Freq: Two times a day (BID) | ORAL | Status: DC
  Administered 2022-03-04: 40 meq
  Filled 2022-03-04: qty 2

## 2022-03-04 MED ORDER — MAGNESIUM SULFATE 2 GM/50ML IV SOLN
2.0000 g | Freq: Once | INTRAVENOUS | Status: AC
  Administered 2022-03-04: 2 g via INTRAVENOUS
  Filled 2022-03-04: qty 50

## 2022-03-04 MED ORDER — FREE WATER: 150.0000 mL | Status: DC

## 2022-03-04 MED ORDER — AZITHROMYCIN 200 MG/5ML PO SUSR: 10.0000 mg/kg | Freq: Every day | ORAL | 0 refills | Status: AC

## 2022-03-04 MED ORDER — JEVITY 1.5 CAL/FIBER PO LIQD: 237.0000 mL | Freq: Four times a day (QID) | ORAL | Status: DC

## 2022-03-04 MED ORDER — POLYETHYLENE GLYCOL 3350 17 G PO PACK
17.0000 g | PACK | Freq: Every day | ORAL | Status: DC
  Administered 2022-03-04: 17 g via NASOGASTRIC
  Filled 2022-03-04: qty 1

## 2022-03-04 MED ORDER — ACETAMINOPHEN 160 MG/5ML PO SOLN: 320.0000 mg | Freq: Four times a day (QID) | ORAL | 1 refills | Status: DC | PRN

## 2022-03-04 MED ORDER — PREDNISOLONE SODIUM PHOSPHATE 15 MG/5ML PO SOLN
10.0000 mg | Freq: Every day | ORAL | Status: DC
  Administered 2022-03-04: 10 mg
  Filled 2022-03-04: qty 3.33

## 2022-03-04 MED ORDER — METOPROLOL TARTRATE 5 MG/5ML IV SOLN
2.5000 mg | INTRAVENOUS | Status: DC | PRN
  Administered 2022-03-04: 2.5 mg via INTRAVENOUS
  Filled 2022-03-04: qty 5

## 2022-03-04 NOTE — Progress Notes (Signed)
Pt presented with fever and cough, brought to the unit from ED still febrile. Heart rate to the 130s. Agricultural consultant and NP informed and aware, ICU charge RN evaluated pt at bedside. Antipyretics ordered. Will continue to monitor pt.  ? ? ? 03/03/22 2300  ?Assess: MEWS Score  ?Temp (!) 102.8 ?F (39.3 ?C)  ?BP 115/73  ?Pulse Rate (!) 138  ?ECG Heart Rate (!) 134  ?Resp 19  ?SpO2 98 %  ?O2 Device Room Air  ?Assess: MEWS Score  ?MEWS Temp 2  ?MEWS Systolic 0  ?MEWS Pulse 3  ?MEWS RR 0  ?MEWS LOC 0  ?MEWS Score 5  ?MEWS Score Color Red  ?Assess: if the MEWS score is Yellow or Red  ?Were vital signs taken at a resting state? Yes  ?Focused Assessment No change from prior assessment  ?Does the patient meet 2 or more of the SIRS criteria? Yes  ?Does the patient have a confirmed or suspected source of infection? Yes  ?Provider and Rapid Response Notified? Yes  ?MEWS guidelines implemented *See Row Information* Yes  ?Treat  ?Pain Scale FLACC  ?Faces Pain Scale 4  ?Breathing 0  ?Negative Vocalization 1  ?Facial Expression 0  ?Body Language 0  ?Consolability 0  ?PAINAD Score 1  ?Take Vital Signs  ?Increase Vital Sign Frequency  Red: Q 1hr X 4 then Q 4hr X 4, if remains red, continue Q 4hrs  ?Escalate  ?MEWS: Escalate Red: discuss with charge nurse/RN and provider, consider discussing with RRT  ?Notify: Charge Nurse/RN  ?Name of Charge Nurse/RN Company secretary, RN  ?Date Charge Nurse/RN Notified 03/03/22  ?Time Charge Nurse/RN Notified 2300  ?Notify: Provider  ?Provider Name/Title Sharion Settler NP  ?Date Provider Notified 03/03/22  ?Time Provider Notified 2338  ?Notification Type Page  ?Notification Reason Other (Comment) ?(RED MEWS)  ?Provider response No new orders  ?Date of Provider Response 03/03/22  ?Time of Provider Response 2341  ?Document  ?Patient Outcome Stabilized after interventions  ?Progress note created (see row info) Yes  ?Assess: SIRS CRITERIA  ?SIRS Temperature  1  ?SIRS Pulse 1  ?SIRS Respirations  0  ?SIRS WBC  0  ?SIRS Score Sum  2  ? ? ?

## 2022-03-04 NOTE — Progress Notes (Signed)
Pt placed on CPAP by RT per order. Pt however has physical limitations that prevents him from removing CPAP in case of emergency. Safety observer order was placed but AC still working on providing one. Pt mom at bedside but asleep. NP Randol Kern informed. Will continue to monitor pt.  ?

## 2022-03-04 NOTE — TOC Transition Note (Signed)
Transition of Care (TOC) - CM/SW Discharge Note ? ? ?Patient Details  ?Name: Lucas [name] ?MRN: [id number] ?Date of Birth: Feb 17, 2001 ? ?Transition of Care (TOC) CM/SW Contact:  ?Candie Chroman, LCSW ?Phone Number: ?03/04/2022, 3:26 PM ? ? ?Clinical Narrative:   Patient has orders to discharge home today. Faxed discharge summary to [facility]. Set up EMS transport for 4:30. Address on facesheet is correct. Cab voucher on chart for mom. She does not have a ride home and unable to afford to pay for a ride. No further concerns. CSW signing off. ? ?Final next level of care: [facility] (Private duty nursing) ?Barriers to Discharge: No Barriers Identified ? ? ?Patient Goals and CMS Choice ?  ?  ?  ? ?Discharge Placement ?  ?           ?  ?Patient to be transferred to facility by: EMS ?Name of family member notified: Harvy Riera ?Patient and family notified of of transfer: 03/04/22 ? ?Discharge Plan and Services ?  ?  ?           ?  ?  ?  ?  ?  ?  ?  ?  ?  ?  ? ?Social Determinants of Health (SDOH) Interventions ?  ? ? ?Readmission Risk Interventions ?   ? View : No data to display.  ?  ?  ?  ? ? ? ? ? ?

## 2022-03-04 NOTE — TOC CM/SW Note (Addendum)
Received fax from Yevette Edwards, RN with [facility]. They provide private duty nursing for patient 84 hours per week. Left message for Mickel Baas. Want to confirm what they will need from [facility] team for discharge. ? ?[place] Scrape, [place] ?[phone] ? ?12:35 pm: Mickel Baas will just need a copy of the discharge summary faxed to them and for [facility] to notify their office once discharged. ? ?[place], [place] ?[phone] ? ?

## 2022-03-04 NOTE — Progress Notes (Signed)
Orders for CPAP QHS. Upon reading patient's history and physical exam, patient found to be non-verbal and with multiple contractures of extremities. I contacted Provider to notify them of the safety concerns. Respiratory does not typically apply Bipap/CPAP on patients who cannot physically remove the unit or at least verbalize the need for removal in the event of an emergency. The patient would need someone present at bedside to monitor. Mother is currently at bedside. Provider stated that a sitter would be ordered. Orders received to continue with CPAP therapy at bedtime. Placed patient on Auto CPAP unit. Patient using tongue to try to move the mask away. Readjusted the fit. SpO2 was 99%. Will continue to monitor.  ?

## 2022-03-04 NOTE — Discharge Summary (Addendum)
?Physician Discharge Summary ?  ?Patient: Lucas [name] MRN: [id number] DOB: May 08, 2001  ?Admit date:     03/03/2022  ?Discharge date: 03/04/22  ?Discharge Physician: Lorella Nimrod  ? ?PCP: [name], MD  ? ?Recommendations at discharge:  ?Please obtain CBC and BMP in 1 week ?Follow-up with your primary care provider in 1 week. ? ?Discharge Diagnoses: ?Principal Problem: ?  Sepsis ([place]) ?Active Problems: ?  Community acquired pneumonia of left lower lobe of lung ?  Sinus tachycardia ?  Seizure ([place]) ?  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  Feeding by G-tube [facility]) ? ?Resolved Problems: ?  * No resolved hospital problems. * ? 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 ? ?Initial vitals in the emergency department showed Tmax of 102.5, respiration rate of 26, heart rate of 140, blood pressure 113/78, SPO2 100% on room air. ? ?Serum sodium 135, potassium 3.9, chloride 95, bicarb 29, BUN of 7, serum creatinine of less than 0.3, nonfasting blood glucose 144, EGFR not able to be Calculated. ? ?WBC was not elevated at 5.9, hemoglobin 14, platelets of 145. ? ?AST was 61 ALT 61.  Lactic acid elevated at 2.2 and improved to 0.7. ? ?Procalcitonin was less than 0.10. ? ?COVID/influenza a/influenza B PCR were negative. ? ?UA was negative for leukocytes and nitrates.  Group strep a was negative. ? ?Blood cultures x2 are negative so far, urine culture is pending. ? ?CT of the chest abdomen pelvis with contrast was read as dependent left lower lobe airspace disease consistent with infection or aspiration pneumonia.  Trace right pleural effusion.  Nonobstructing 3 mm right Renal calculus.  Peg tube  within gastric lumen.  Diffuse muscular atrophy consistent with history of muscular dystrophy. ? ?ED treatment: IV Tylenol, azithromycin 500 mg daily, 5 days ordered, ceftriaxone 2 g IV daily, 5 days ordered, LR bolus, total of 2 L ordered, LR IVF at 150 mL/h. ? ?3/24: Patient improving, procalcitonin remain negative.  Some electrolyte derangements which include hypokalemia and borderline magnesium which is being repleted.  Can be a viral infection, checking respiratory viral panel.  Discontinue ceftriaxone and continuing Zithromax at this time. ? ?Likely be discharged tomorrow if remains stable. ? ?Mother later requested discharge on the same day.  Patient seems stable and is being discharged home under mother's care.  He was given a course of Zithromax and will follow-up with his providers.  He will continue his current home regimen. ? ?Assessment and Plan: ?* Sepsis ([place]) ?- Presumed secondary to left lower lobe pneumonia ?- Tmax 102.5, respiration rate 26, heart rate 140 ?Patient received IV fluid and antibiotics per sepsis protocol. ?Blood cultures negative so far, urine cultures pending.  Procalcitonin remain negative in 2 days.  Can be a viral etiology.  COVID-19 and flu  Negative ?-Check respiratory viral panel ?-Stop ceftriaxone ?-Continue with Zithromax ?-Continue with supportive care. ? ?Community acquired pneumonia of left lower lobe of lung ?- Complicated by restrictive lung disease in setting of Duchenne muscular dystrophy ?- Incentive spirometry and flutter valve were considered however patient minimally follows commands ?-Can be of viral etiology as procalcitonin remain negative ? ?Sinus tachycardia ? Presumed secondary to sepsis presentation from pneumonia ?Treat as above ? Patient had outpatient [facility]  pediatric cardiologist evaluation on 01/11/2022 for tachycardia.  Echo was performed and evaluated which showed normal chamber size, wall thickness, normal squeezing ? ?Seizure ([place]) ?- Patient takes  Carbamazepine 150 mg chewable tablet per tube twice daily, evening dose has been ordered in the ED ?- Ativan 2 mg IV as needed for seizures, 2 doses ordered, with instructions to administer and then let provider know ?- Aspiration precautions ? 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 ?Feeding by G-tube ([place]) ?- N.p.o.  ?- Dietitian consulted to resume tube feeding. ? ? ? ? ?Consultants: None ?Procedures performed: None ?Disposition: Home ?Diet recommendation:  ?Discharge Diet Orders (From admission, onward)  ? ?  Start     Ordered  ? 03/04/22 0000  Diet - low sodium heart healthy       ? 03/04/22 1412  ? ?  ?  ? ?  ? ?NPO tube feed ?DISCHARGE MEDICATION: ?Allergies as of 03/04/2022   ? ?   Reactions  ? Penicillins Hives, Rash  ? Has patient had a PCN reaction causing immediate rash, facial/tongue/throat swelling, SOB or lightheadedness with hypotension: Yes ?Has patient had a PCN reaction causing severe rash involving mucus membranes or skin necrosis: Yes ?Has patient had a PCN reaction that required hospitalization: No ?Has patient had a PCN reaction occurring within the last 10 years: No ?If all of the above answers are "NO", then may proceed with Cephalosporin use.  ? Docusate Sodium   ? Rash and cant sleep  ? Multivitamins   ? Red in face, itchy, bumps  ? Other Other (See Comments)  ? [place] -- gets rash and cant sleep  ? Calcitonin Rash  ? Esomeprazole Magnesium Nausea And Vomiting  ? Omeprazole Other (See Comments)  ? Constipation and possible rash after drug was stopped  ? Simethicone Palpitations, Rash  ? Sunflower Oil Itching, Rash, Other (See Comments)  ? GI upset - Avoid products containing this additive  ? Vitamin D Analogs Other (See Comments)  ? Rash on face if it contains sunflower seed oil Excessive urine output and thirsty, increase in gas  ? ?  ? ?  ?Medication List  ?  ? ?STOP taking these medications   ? ?nystatin powder ?Commonly known as: MYCOSTATIN/NYSTOP ?   ?nystatin-triamcinolone ointment ?Commonly known as: MYCOLOG ?  ? ?  ? ?TAKE these medications   ? ?acetaminophen 160 MG/5ML solution ?Commonly known as: TYLENOL ?Take 10 mLs (320 mg total) by mouth every 6 (six) hours as needed for mild pain, moderate pain, fever or headache. ?  ?azithromycin 200 MG/5ML suspension ?Commonly known as: ZITHROMAX ?Take 12 mLs (480 mg total) by mouth daily for 5 days. ?  ?carbamazepine 100 MG chewable tablet ?Commonly known as: TEGRETOL ?TAKE 1 & 1/2 TABLETS BY MOUTH TWICE A DAY ?  ?Nutritional Supplement Plus Liqd ?3 cartons (888 mL) Ensure Complete given by tube daily. ?  ?polyethylene glycol powder 17 GM/SCOOP powder ?Commonly known as: GoodSense ClearLax ?GIVE 17 GM BY TUBE EVERY DAY ?What changed:  ?how much to take ?how to take this ?when to take this ?additional instructions ?  ?prednisoLONE 15 MG/5ML solution ?Commonly known as: ORAPRED ?PLACE 3 ML INTO FEEDING TUBE DAILY ?  ? ?  ? ? Follow-up Information   ? ? [name], MD. Schedule an appointment as soon as possible for a visit in 1 week(s).   ?Specialty: Family Medicine ?Contact information: ?[address] ?[place] [facility] ?  [place] [address] ?[phone] ? ? ?  ?  ? ? Rocky Link, MD .   ?Specialty: Pediatric Neurology ?Contact information: ?[address] ?[place] [address] ?[phone] ? ? ?  ?  ? ?  ?  ? ?  ? ?Discharge Exam: ?Filed Weights  ? 03/03/22 1608 03/03/22 2204  ?Weight: 44.9 kg 47.8 kg  ? ?General.  Young adult with Duchenne dystrophy, multiple contractures involving extremities, more on lower extremities than above, in no acute distress. ?Pulmonary.  Lungs clear bilaterally, normal respiratory effort. ?CV.  Regular rate and rhythm, no JVD, rub or murmur. ?Abdomen.  Soft, nontender, nondistended, BS positive. ?CNS.  Somnolent.  No new deficit ?Extremities.  No edema, no cyanosis, pulses intact and symmetrical. ?Psychiatry.  Judgment and  insight appears impaired ? ?Condition at discharge: stable ? ?The results of significant diagnostics from this hospitalization (including imaging, microbiology, ancillary and laboratory) are listed below for reference

## 2022-03-04 NOTE — Progress Notes (Signed)
Initial Nutrition Assessment ? ?DOCUMENTATION CODES:  ? ?Not applicable ? ?INTERVENTION:  ?Initiate tube feeding via PEG tube: ?1 carton Jevity 1.5 QID (948 ml per day) ? ?Provides 1420 kcal, 60 gm protein, 720 ml free water daily ? ?Free water flushes 136ml Q4H ? ?NUTRITION DIAGNOSIS:  ? ?Increased nutrient needs related to acute illness as evidenced by estimated needs. ? ?GOAL:  ? ?Patient will meet greater than or equal to 90% of their needs ? ?MONITOR:  ? ?PO intake, Labs, Weight trends, TF tolerance ? ?REASON FOR ASSESSMENT:  ? ?Consult ?Assessment of nutrition requirement/status, Enteral/tube feeding initiation and management ? ?ASSESSMENT:  ? ?Pt admitted with cough and fever for 3 days secondary to sepsis. 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 ? ?Spoke with pt's mom at bedside. Pt noted to be distended during visit, and requested an enema to help with bowel movement. She reports that he has only had a very small BM within the last 5 days. At home he received miralax and prune juice which helps with constipation. She states that at home he was getting 4 cartons of Ensure Plus. They are only able to use Abbott products and reports an intolerance to [place] products such as Boost. We discussed trying a standard tube feeding formula during admission to monitor his tolerance with the recommendation of Jevity as it does not contain sunflower oil products which per review of chart, he has an intolerance to. She is in agreement.  ? ?Reviewed weight history. His weights have been stable with noted weight increased since January. Current weight noted to be 105 lbs. ? ?Medications: KLOR-CON, prednisolone ?IV medications: abx, Magnesium sulfate ? ?Labs: potassium 3.2, BUN 5, Cr <0.30, calcium 8.4, AST 61, ALT 61 ? ?NUTRITION - FOCUSED  PHYSICAL EXAM: ?Pt is noted to be wheel-chair bound with contractures to lower extremities. ? ?Flowsheet Row Most Recent Value  ?Orbital Region No depletion  ?Upper Arm Region Mild depletion  ?Thoracic and Lumbar Region No depletion  ?Buccal Region No depletion  ?Temple Region No depletion  ?Clavicle Bone Region No depletion  ?Clavicle and Acromion Bone Region No depletion  ?Scapular Bone Region No depletion  ?Dorsal Hand Mild depletion  ?Patellar Region Mild depletion  ?Anterior Thigh Region Mild depletion  ?Posterior Calf Region Mild depletion  ?Edema (RD Assessment) None  ?Hair Reviewed  ?Eyes Reviewed  ?Mouth Reviewed  ?Skin Reviewed  ?Nails Reviewed  ? ?  ? ? ?Diet Order:   ?Diet Order   ? ?       ?  Diet NPO time specified  Diet effective now       ?  ? ?  ?  ? ?  ? ? ?EDUCATION NEEDS:  ? ?Education needs have been addressed ? ?Skin:  Skin Assessment: Reviewed RN Assessment ? ?Last BM:  3/19 ? ?Height:  ? ?Ht Readings from Last 1 Encounters:  ?03/03/22 5' (1.524 m)  ? ?Weight:  ? ?Wt Readings from Last 1 Encounters:  ?03/03/22 47.8 kg  ? ?BMI:  Body mass index is 20.58 kg/m?. ? ?Estimated Nutritional Needs:  ? ?Kcal:  1200-1400 ? ?Protein:  60-75g ? ?Fluid:  >/=1.5L ? ?Clayborne Dana, RDN, LDN ?Clinical Nutrition ?

## 2022-03-04 NOTE — Progress Notes (Signed)
?Progress Note ? ? ?Patient: Lucas [name]:[id number] DOB: Feb 27, 2001 DOA: 03/03/2022     1 ?DOS: the patient was seen and examined on 03/04/2022 ?  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 ? ?Initial vitals in the emergency department showed Tmax of 102.5, respiration rate of 26, heart rate of 140, blood pressure 113/78, SPO2 100% on room air. ? ?Serum sodium 135, potassium 3.9, chloride 95, bicarb 29, BUN of 7, serum creatinine of less than 0.3, nonfasting blood glucose 144, EGFR not able to be Calculated. ? ?WBC was not elevated at 5.9, hemoglobin 14, platelets of 145. ? ?AST was 61 ALT 61.  Lactic acid elevated at 2.2 and improved to 0.7. ? ?Procalcitonin was less than 0.10. ? ?COVID/influenza a/influenza B PCR were negative. ? ?UA was negative for leukocytes and nitrates.  Group strep a was negative. ? ?Blood cultures x2 are negative so far, urine culture is pending. ? ?CT of the chest abdomen pelvis with contrast was read as dependent left lower lobe airspace disease consistent with infection or aspiration pneumonia.  Trace right pleural effusion.  Nonobstructing 3 mm right Renal calculus.  Peg tube within gastric lumen.  Diffuse muscular atrophy consistent with history of muscular dystrophy. ? ?ED treatment: IV Tylenol, azithromycin 500 mg daily, 5 days ordered, ceftriaxone 2 g IV daily, 5 days ordered, LR bolus, total of 2 L ordered, LR IVF at 150 mL/h. ? ?3/24: Patient improving, procalcitonin remain negative.  Some electrolyte derangements which include hypokalemia and borderline magnesium which is being repleted.  Can be a viral infection, checking respiratory viral  panel.  Discontinue ceftriaxone and continuing Zithromax at this time. ? ?Likely be discharged tomorrow if remains stable. ? ? ?Assessment and Plan: ?* Sepsis ([place]) ?- Presumed secondary to left lower lobe pneumonia ?- Tmax 102.5, respiration rate 26, heart rate 140 ?Patient received IV fluid and antibiotics per sepsis protocol. ?Blood cultures negative so far, urine cultures pending.  Procalcitonin remain negative in 2 days.  Can be a viral etiology.  COVID-19 and flu  Negative ?-Check respiratory viral panel ?-Stop ceftriaxone ?-Continue with Zithromax ?-Continue with supportive care. ? ?Left lower lobe pneumonia ?- Complicated by restrictive lung disease in setting of Duchenne muscular dystrophy ?- Incentive spirometry and flutter valve were considered however patient minimally follows commands ?-Can be of viral etiology as procalcitonin remain negative ? ?Sinus tachycardia ? Presumed secondary to sepsis presentation from pneumonia ?Treat as above ? Patient had outpatient [facility] pediatric cardiologist evaluation on 01/11/2022 for tachycardia.  Echo was performed and evaluated which showed normal chamber size, wall thickness, normal squeezing ? ?Seizure ([place]) ?- Patient takes Carbamazepine 150 mg chewable tablet per tube twice daily, evening dose has been ordered in the ED ?- Ativan 2 mg IV as needed for seizures, 2 doses ordered, with instructions to administer and then let provider know ?- Aspiration precautions ? 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 ?Feeding by G-tube ([place]) ?- N.p.o.  ?- Dietitian consulted to resume tube feeding. ? ?  ? ?Subjective: Patient was seen and examined today.  He was resting comfortably with CPAP on, mother at bedside.  She is the primary caretaker.  Per mother he is feeling much improved. ?Currently  afebrile. ? ?Physical Exam: ?Vitals:  ? 03/04/22 0900 03/04/22 1000 03/04/22 1100 03/04/22 1117  ?BP: 110/68 100/60 98/65 101/79  ?Pulse: (!) 114 (!) 105 (!)  102 98  ?Resp: (!) 31 (!) 24 (!) 25 20  ?Temp:    98 ?F (36.7 ?C)  ?TempSrc:    Axillary  ?SpO2: 95% 92% 94% 97%  ?Weight:      ?Height:      ? ?General.  Younger adult with Duchenne dystrophy, multiple contractures involving extremities, more on lower extremities than above, in no acute distress. ?Pulmonary.  Lungs clear bilaterally, normal respiratory effort. ?CV.  Regular rate and rhythm, no JVD, rub or murmur. ?Abdomen.  Soft, nontender, nondistended, BS positive. ?CNS.  Somnolent.  No new deficit. ?Extremities.  No edema, no cyanosis, pulses intact and symmetrical. ?Psychiatry.  Judgment and insight appears impaired. ? ?Data Reviewed: ?Prior notes, labs and images reviewed ? ?Family Communication: Discussed with mother at bedside ? ?Disposition: ?Status is: Inpatient ?Remains inpatient appropriate because: Severity of illness ? ? Planned Discharge Destination: Home with Home Health ? ?DVT prophylaxis.  Lovenox ? ?Time spent: 45 minutes ? ?This record has been created using Systems analyst. Errors have been sought and corrected,but may not always be located. Such creation errors do not reflect on the standard of care. ? ?Author: ?Lorella Nimrod, MD ?03/04/2022 1:04 PM ? ?For on call review [email or web address].  ?

## 2022-03-05 LAB — URINE CULTURE: Culture: NO GROWTH

## 2022-03-07 ENCOUNTER — Telehealth (INDEPENDENT_AMBULATORY_CARE_PROVIDER_SITE_OTHER): Payer: Self-pay | Admitting: Pediatrics

## 2022-03-07 ENCOUNTER — Encounter (INDEPENDENT_AMBULATORY_CARE_PROVIDER_SITE_OTHER): Payer: Self-pay | Admitting: Pediatrics

## 2022-03-07 NOTE — Telephone Encounter (Signed)
?  Name of who is calling: ?Butch Penny ?Caller's Relationship to Patient: ?Private Duty Nurse ?Best contact number: ?[phone] ?Provider they see: ?[facility] ?Reason for call: ?Patient was in the hospital. Patient has metapneumonic virus .. please contact private duty nurse to discuss further  ? ? ? ?PRESCRIPTION REFILL ONLY ? ?Name of prescription: ? ?Pharmacy: ? ? ?

## 2022-03-08 LAB — CULTURE, BLOOD (ROUTINE X 2)
Culture: NO GROWTH
Culture: NO GROWTH
Special Requests: ADEQUATE
Special Requests: ADEQUATE

## 2022-03-18 ENCOUNTER — Encounter (INDEPENDENT_AMBULATORY_CARE_PROVIDER_SITE_OTHER): Payer: Self-pay | Admitting: Pediatrics

## 2022-03-21 ENCOUNTER — Encounter (INDEPENDENT_AMBULATORY_CARE_PROVIDER_SITE_OTHER): Payer: Self-pay | Admitting: Pediatrics

## 2022-03-22 NOTE — Telephone Encounter (Signed)
Call about BiPap- Order sent in Jan and RN was told they were received but were unable to reach mom to sched set up. RN sent mom the number to contact them but received a message from mom he has not received it. ?Stanton Kidney reported they did speak with mom but her account was on hold because of an unpaid bill. RN advised he has Medicaid and should not have any unpaid balance and is illegal to balance bill the family. She reported they did speak with mom but she was not able to come and pick up the machine and pay the balance. Mom was to call them back once she had arranged for someone to take her. RN advised mom does not have anyone to take her and cannot come pick it up. RN does not want mom to come and pick it up the machine was to be set up in the home and mom instructed on how to use it. Mom may not be capable of retaining the information and be able to set up the machine at home. If they do not have an RT that is going to take it out and set it up then RN will send the referral to a company that does. Stanton Kidney reports they do have some that go out but she is not sure in his area. RN advised an RT has been to the home previously to check on his current CPAP machine so they do go to that area. Stanton Kidney is to find out why the account is locked, why the machine has not been set up and if someone is taking it to set it up and let RN know. Secure email also sent to Darlina Guys account manager at [facility] with all of the above information as well.  ? ?My chart message sent to mom with information as well.  ?

## 2022-03-28 ENCOUNTER — Telehealth (INDEPENDENT_AMBULATORY_CARE_PROVIDER_SITE_OTHER): Payer: Self-pay | Admitting: Pediatrics

## 2022-03-28 NOTE — Telephone Encounter (Signed)
?  Name of who is calling:Tracy  ? ?Caller's Relationship to Patient:Mother  ? ?Best contact number:[phone] ? ?Provider they see:DR. Stoudmire  ? ?Reason for call:Mom called requesting a call back regarding insurance only sending one hose and she really needs another one. Please call mom back.  ? ? ? ? ?PRESCRIPTION REFILL ONLY ? ?Name of prescription: ? ?Pharmacy: ? ? ?

## 2022-04-04 ENCOUNTER — Ambulatory Visit: Payer: Medicaid Other | Admitting: Gastroenterology

## 2022-04-15 ENCOUNTER — Ambulatory Visit (INDEPENDENT_AMBULATORY_CARE_PROVIDER_SITE_OTHER): Payer: Medicaid Other | Admitting: Pediatrics

## 2022-04-15 ENCOUNTER — Encounter (INDEPENDENT_AMBULATORY_CARE_PROVIDER_SITE_OTHER): Payer: Self-pay | Admitting: Pediatrics

## 2022-04-15 ENCOUNTER — Telehealth (INDEPENDENT_AMBULATORY_CARE_PROVIDER_SITE_OTHER): Payer: Medicaid Other | Admitting: Pediatrics

## 2022-04-15 VITALS — Wt 105.0 lb

## 2022-04-15 DIAGNOSIS — G7100 Muscular dystrophy, unspecified: Secondary | ICD-10-CM

## 2022-04-15 DIAGNOSIS — M4145 Neuromuscular scoliosis, thoracolumbar region: Secondary | ICD-10-CM | POA: Diagnosis not present

## 2022-04-15 DIAGNOSIS — J984 Other disorders of lung: Secondary | ICD-10-CM | POA: Diagnosis not present

## 2022-04-15 DIAGNOSIS — G4733 Obstructive sleep apnea (adult) (pediatric): Secondary | ICD-10-CM | POA: Diagnosis not present

## 2022-04-15 DIAGNOSIS — G7101 Duchenne or Becker muscular dystrophy: Secondary | ICD-10-CM

## 2022-04-15 DIAGNOSIS — R0689 Other abnormalities of breathing: Secondary | ICD-10-CM

## 2022-04-15 NOTE — Progress Notes (Signed)
as ?This is a [facility] E-Visit consult/follow up provided via My Chart ?Lucas [name] and their parent/guardian Lucas [name] mother (name of consenting adult) consented to an E-Visit consult today.  ?Location of patient: [name] is at home in [place] (location) ?[facility] ?Patient was referred by [name], MD  ? ?The following participants were involved in this E-Visit: Pat Patrick, MD, Blair Heys RN and Holley Bouche mother with [name] (list of participants and their roles) ? ?This visit was done via VIDEO  ? ?Chief Complain/ Reason for E-Visit today: followup on muscular dystrophy and restrictive lung disease ?Total time on call: 15 min ?Follow up: 25 min ?

## 2022-04-15 NOTE — Progress Notes (Signed)
Pediatric Pulmonology  ?Clinic Note  ?04/15/2022 ? ?Primary Care Physician: ?[name], MD ? ?Reason For Visit: Pulmonary care for Duchenne Muscular Dystrophy ? ?Assessment and Plan:  ?Lucas [name] is a 21 y.o. male who was seen today for the following issues: ? 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 Overall, he has done very well from a respiratory standpoint, but has had a recent pneumonia, and had hypoventilation seen on a sleep study, so we have now started him on BiPap at night. He seems to be doing well on that.  ?Plan: ?- Continue Bipap at night- 10/6, itime 0.5-1.5 ?- Continue cough assist and vest BID, increase to 3-4 x when sick ?- Continue Xopenex ? ?Obstructive sleep apnea: ?Lucas [name] has obstructive sleep apnea related to his muscular dystrophy, and has been using CPAP for ~ 3 years now. Now doing well with bipap ?Plan: ?- Continue bipap as above ?- no significant hypoxemia seen on recent sleep study - but he may benefit from a pulse ox study to see if he needs supplemental oxygen if he is having observed desaturations at home ? ?Healthcare Maintenance: Lucas [name] has received a flu vaccine this season.  ?Has received 3 COVID vaccines.  ? ?Followup: Return in about 6 months (around 10/16/2022). ?    ?Lucas Blyth "Will" Pettibone Cellar, MD ?[facility] ?[facility] ?[place] Office: [phone] ?[place] Office [phone] ?Subjective:  ?Lucas [name] is a 21 y.o. male who is seen for followup of pulmonary manifestations of duchenne's muscular dystrophy.   ? ? Lucas [name] was last seen by myself in clinic on 10/28/2021. At that time, he was doing fairly well. I ordered a sleep study to evaluate for nocturnal hypoventilation and for bipap initiation. ? ?Lucas [name] was briefly hospitalized in March for pneumonia. ? ?Lucas [name]'s mother reports that he has recovered well from his pneumonia.  She says that his  symptoms did not seem to have a preceding viral infection, but they noted him to have high fevers and the pneumonia was found on diagnostic work-up for etiology of fevers.  He now seems to be back at baseline. ? ?She reports that he got his BiPAP machine about 3 weeks ago, and that he has been doing well with the BiPAP.  He wears it every night, and has not had any significant issues or problems with transitioning from CPAP to BiPAP.  She has not noticed a huge difference overall, though he does seem to get less secretions pooling in the back of his throat. ? ?He is continuing to use his CoughAssist and chest vest twice a day, and more often when sick. ? ?They have been having issues with monitoring his oxygen levels recently.  She says they do not have oxygen at home, wonders if he needs it.  The pulse ox machine that they have does not seem to be working accurately. ? ?Past Medical History:  ? ?Patient Active Problem List  ? Diagnosis Date Noted  ? Sepsis ([place]) 03/03/2022  ? Sinus tachycardia 03/03/2022  ? Community acquired pneumonia of left lower lobe of lung 03/03/2022  ? Ineffective airway clearance 10/29/2021  ? Feeding by G-tube ([place]) 03/18/2019  ? 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 Urinary retention 11/01/2018  ? OSA (obstructive sleep apnea) 10/03/2017  ? Failure to thrive (0-17) 05/24/2017  ? 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 Right knee injury 06/30/2016  ? Constipation   ? Contractures involving both  knees 07/21/2015  ? Seizure ([place]) 06/08/2015  ? Osteoporosis 10/30/2014  ? Autism spectrum disorder with accompanying language impairment and intellectual disability, requiring very substantial support 06/17/2014  ? Complex care coordination 04/30/2014  ? Partial epilepsy with impairment of consciousness ([place]) 04/30/2014  ? Duchenne muscular dystrophy ([place]) 04/30/2014  ?  ?Medications:  ? ?Current Outpatient Medications:  ?  carbamazepine  (TEGRETOL) 100 MG chewable tablet, TAKE 1 & 1/2 TABLETS BY MOUTH TWICE A DAY, Disp: 90 tablet, Rfl: 5 ?  Nutritional Supplements (NUTRITIONAL SUPPLEMENT PLUS) LIQD, 3 cartons (888 mL) Ensure Complete given by tube daily., Disp: 27528 mL, Rfl: 12 ?  polyethylene glycol powder (GOODSENSE CLEARLAX) 17 GM/SCOOP powder, GIVE 17 GM BY TUBE EVERY DAY (Patient taking differently: 17 g by Per NG tube route daily. (IN PRUNE JUICE)), Disp: 476 g, Rfl: 11 ?  prednisoLONE (ORAPRED) 15 MG/5ML solution, PLACE 3 ML INTO FEEDING TUBE DAILY, Disp: 90 mL, Rfl: 11 ?  acetaminophen (TYLENOL) 160 MG/5ML solution, Take 10 mLs (320 mg total) by mouth every 6 (six) hours as needed for mild pain, moderate pain, fever or headache. (Patient not taking: Reported on 04/15/2022), Disp: 473 mL, Rfl: 1 ?  levalbuterol (XOPENEX) 1.25 MG/3ML nebulizer solution, Take 1.25 mg by nebulization every 4 (four) hours as needed. (Patient not taking: Reported on 04/15/2022), Disp: , Rfl:  ?  sodium chloride HYPERTONIC 3 % nebulizer solution, Inhale into the lungs. (Patient not taking: Reported on 04/15/2022), Disp: , Rfl:  ? ?Social History:  ? ?Social History  ? ?Social History Narrative  ? He lives with mother.  ? He graduated high school in 22.   ? He is not currently in a day program   ? He currently is in PT (at home) mom upset they are going to lose this service next year.   ?   ?Lives with mother and Lucas [name] in [place] [id number]. Mother smokes.  ? ?Objective:  ?Vitals Signs: Wt 105 lb (47.6 kg)   BMI 20.51 kg/m?  ?Wt Readings from Last 3 Encounters:  ?04/15/22 105 lb (47.6 kg)  ?03/03/22 105 lb 6.1 oz (47.8 kg)  ?12/18/21 95 lb (43.1 kg)  ? ?GENERAL: Appears comfortable and in no respiratory distress. Sitting in wheelchair - alert and responsive ?ENT:  ENT exam reveals no visible nasal polyps. No skin breakdown ?RESPIRATORY:  No stridor or stertor. Clear to auscultation bilaterally, normal work and rate of breathing with no retractions, no crackles  or wheezes, with symmetric breath sounds throughout.  No clubbing.  ?CARDIOVASCULAR:  Regular rate and rhythm without murmur.   ? ?Medical Decision Making:  ? ?Recent Blood Gases/Bicarbonates: ? ?pH, Ven  ?Date Value Ref Range Status  ?06/09/2015 7.397 (H) 7.250 - 7.300 Final  ? ?pCO2, Ven  ?Date Value Ref Range Status  ?06/09/2015 36.7 (L) 45.0 - 50.0 mmHg Final  ? ?Bicarb:  ? ?CO2  ?Date Value Ref Range Status  ?03/04/2022 31 22 - 32 mmol/L Final  ? ?Co2  ?Date Value Ref Range Status  ?03/12/2015 27 mmol/L Final  ?  Comment:  ?  22-32 ?NOTE: New Reference Range ? 02/17/15 ?  ? ?Per Larrie Kass note: ? ?EEG showed right central diphasic sharply contoured slow-wave activity.   ?MRI of the brain failed to show a structural abnormality.   Autism was diagnosed at age 74 ?Duchenne muscular dystrophy was diagnosed at age 41. ?[facility]ysomnogram October 2018 at [facility] - obstructive sleep apnea ?EKG 05/11/18 - normal sinus rhythm with normal PR,  QRS and corrected QT interval ?Echocardiogram 05/11/18 - normal biventricular size and function ? ?Polysomnography: jan 2023 ? ?  ?

## 2022-04-15 NOTE — Patient Instructions (Signed)
Pediatric Pulmonology  ?Clinic Discharge Instructions  ?     ?04/15/22  ?  ?It was great to see you and [place] today. We will work on investigating his pulse ox machine. Otherwise, we will continue his current plan.  ?  ?Followup: Return in about 6 months (around 10/16/2022). ? ?Please call [phone] with any further questions or concerns.  ? ? ?

## 2022-04-15 NOTE — Progress Notes (Signed)
Patient: Lucas [name] MRN: [id number] Sex: male DOB: 2001-10-06  Provider: [name], MD Location of Care: Pediatric Specialist- Pediatric Complex Care Note type: Routine return visit   This is a Pediatric Specialist E-Visit follow up consult provided via MyChart Lucas [name] and their parent/guardian Lucas [name] consented to an E-Visit consult today.  Location of patient: Lucas [name] is at home in [place], [place] Location of provider: [name], MD is at Pediatric Specialists  The following participants were involved in this E-Visit:  Lucas Coaster, MD, Lucas [name], RMA, Lucas [name], Scribe, Lucas [name], patient, and their parent/guardian Lucas [name]  This visit was done via VIDEO     History of Present Illness: Referral Source: Collene Schlichter, MD  History from: patient and prior records Chief Complaint: Pediatric Complex Care   Lucas [name] is a 21 y.o. male with history of Duchenne Muscular Dystrophy, autism spectrum disorder, level 3, restrictive lung disease, compensated congestive heart failure, severe dysphagia requiring tube feeding, and obstructive sleep apnea and a recent pathologic fracture of the proximal one third left humerus who I am seeing in follow-up for complex care management. Patient was last seen 11/18/21.  Since that appointment, patient has been seen in the ED on 12/19/21 for diarrhea and on 03/03/22 where he was admitted for treatment of his pneumonia.   Patient presents today with his mother who reports:  Symptom management:  He is doing well now on Ensure plus.  No diarrhea, minimal constipation.  Gas is intermittent.  No rashes.    He is sleeping well once he's asleep. Mother doesn't have him on any regimen.    His strength is "pretty good".  Taking steroids without problems, no decline.     Care coordination (other providers): He had his sleep study on 12/17/21 where Bipap was recommended. Mom confirmed he received this on  03/24/22.  He saw Lucas [name] in cardiology on 01/11/22 for an ECHO who recommended follow up in 12-18 mo with either them or an adult cardiologist.   His formula has changed several times since the last visit and is now seeing Lucas [name], RD with Lucas [name] who has put him on a regimen of Ensure plus. Not taking anything by mouth.    Mom cancelled appointment Dr. Damita Lack because he was doing "ok".   Care management needs:  We continue to be in discussion about community resources for after he turns 21, including innovations and Cap-DA.  He graduated last year, mother didn't want to complete further years with COVID, even though he was in virtual school. He is still getting PT once weekly at home.  Her name is Lucas [name] with everyday kids.     Mother requests I do not talk to Lyn Antequara any longer.  She requests I speak with Lucas [name] instead. Mother requesting further respite hours. He used up his respite hours after his hospitalization. She requests to continue to get PDN, does not want Innovations or CAP/DA. Mother reports she called the medicaid office who told her he would stay on the same medicaid program.  They also said he would not qualify for medicare.   Mother reports that she spoke to Lucas [name], reports they won't help, say they need to talk to Lucas [name], can't talk to mother.  She reports that [place] sent them the guardianship paperwork, but she has not spoken to them since.    Equipment needs:  Recently received new CPAP machine, this is working well.  Mother reports  they denied paperwork, didn't get it in time.    Past Medical History Past Medical History:  Diagnosis Date   Autism    Autism    Community acquired pneumonia of left lower lobe of lung 03/03/2022   Diarrhea 06/09/2015   Family history of adverse reaction to anesthesia    MGGM- N/V   Muscular dystrophy ([place])    Scoliosis    Seizures ([place])    Last one 2013   Sepsis ([place]) 03/03/2022   Transient alteration of  awareness    Viral gastroenteritis    Yeast infection of the skin 10/24/2020    Surgical History Past Surgical History:  Procedure Laterality Date   CIRCUMCISION     at birth   LAPAROSCOPIC GASTROSTOMY N/A 05/24/2017   Procedure: LAPAROSCOPIC GASTROSTOMY TUBE PLACEMENT;  Surgeon: Stanford Scotland, MD;  Location: [facility];  Service: General;  Laterality: N/A;   SPINE SURGERY N/A    Phreesia 06/29/2020    Family History family history includes Alcohol abuse in his father; Asthma in an other family member; Cancer in his paternal grandfather; Dementia in his paternal grandmother; Heart failure in his maternal grandmother; Hyperlipidemia in his mother and another family member; Hypertension in his maternal grandmother and another family member; Learning disabilities in his mother; Other in his maternal aunt, maternal grandmother, maternal uncle, and mother; Vision loss in an other family member.   Social History Social History   Social History Narrative   He lives with mother.   He graduated high school in 22.    He is not currently in a day program    He currently is in PT (at home) mom upset they are going to lose this service when he turns 21.     Allergies Allergies  Allergen Reactions   Penicillins Hives and Rash    Has patient had a PCN reaction causing immediate rash, facial/tongue/throat swelling, SOB or lightheadedness with hypotension: Yes Has patient had a PCN reaction causing severe rash involving mucus membranes or skin necrosis: Yes Has patient had a PCN reaction that required hospitalization: No Has patient had a PCN reaction occurring within the last 10 years: No If all of the above answers are "NO", then may proceed with Cephalosporin use.    Docusate Sodium     Rash and cant sleep   Multivitamins     Red in face, itchy, bumps   Other Other (See Comments)    Senkot -- gets rash and cant sleep   Calcitonin Rash   Esomeprazole Magnesium Nausea And Vomiting    Omeprazole Other (See Comments)    Constipation and possible rash after drug was stopped   Simethicone Palpitations and Rash   Sunflower Oil Itching, Rash and Other (See Comments)    GI upset - Avoid products containing this additive   Vitamin D Analogs Other (See Comments)    Rash on face if it contains sunflower seed oil Excessive urine output and thirsty, increase in gas    Medications Current Outpatient Medications on File Prior to Visit  Medication Sig Dispense Refill   Nutritional Supplements (NUTRITIONAL SUPPLEMENT PLUS) LIQD 3 cartons (888 mL) Ensure Complete given by tube daily. (Patient taking differently: 4 cartons  Ensure Plus given by tube daily.) 27528 mL 12   prednisoLONE (ORAPRED) 15 MG/5ML solution PLACE 3 ML INTO FEEDING TUBE DAILY 90 mL 11   acetaminophen (TYLENOL) 160 MG/5ML solution Take 10 mLs (320 mg total) by mouth every 6 (six) hours as needed for  mild pain, moderate pain, fever or headache. (Patient not taking: Reported on 04/15/2022) 473 mL 1   levalbuterol (XOPENEX) 1.25 MG/3ML nebulizer solution Take 1.25 mg by nebulization every 4 (four) hours as needed. (Patient not taking: Reported on 04/15/2022)     sodium chloride HYPERTONIC 3 % nebulizer solution Inhale into the lungs. (Patient not taking: Reported on 04/15/2022)     No current facility-administered medications on file prior to visit.   The medication list was reviewed and reconciled. All changes or newly prescribed medications were explained.  A complete medication list was provided to the patient/caregiver.  Physical Exam Wt 105 lb (47.6 kg)   BMI 20.51 kg/m  Weight for age: Facility age limit for growth percentiles is 20 years.  Length for age: Facility age limit for growth percentiles is 20 years. BMI: Body mass index is 20.51 kg/m. No results found. EXAM LIMITED BY VIRTUAL VISIT General: NAD, well nourished young man. HEENT: normocephalic, no eye or nose discharge.  MMM  Cardiovascular: well  perfused Lungs: Normal work of breathing Neuro: Alert, aware of video. EOM intact, face symmetric. Limited movement of extremities, wheelchair dependent.      Diagnosis:  1. Duchenne muscular dystrophy ([place])   2. Constipation, unspecified constipation type   3. Partial epilepsy with impairment of consciousness   4. Obstructive sleep apnea syndrome   5. Autism spectrum disorder with accompanying language impairment and intellectual disability, requiring very substantial support   6. Contracture of multiple joints   7. Gastrostomy tube dependent ([place])   8. 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 male with history of Duchenne Muscular Dystrophy, autism spectrum disorder, level 3, restrictive lung disease, compensated congestive heart failure, severe dysphagia requiring tube feeding, obstructive sleep apnea and a recent pathologic fracture of the proximal one third left humerus who presents for follow-up in the pediatric complex care clinic.  Patient seen by case manager, dietician, integrated behavioral [name] today as well, please see accompanying notes.  I discussed case with all involved parties for coordination of care and recommend patient follow their instructions as below.   Symptom management:  Continued medication regimen as patient is doing will with no decline. Recommend they continue to follow advice from RD and with [place] regimen.   Care coordination: Recommend mom schedule with other providers PRN. Continue with RD through [facility].   Care management needs:  Will reach out to clinical supervisor for Lucas Barr, Lucas [name], ask what kind of order she needs to continue private duty nursing for this patient after he turns 21yo.  Patient will age out of CAP/C, and mother is declining CAP/DA and innovations because he will no longer qualify for private duty nursing under those programs.  Mother says she was told by  Lucas [name] that she can continue to receive nursing without those programs, but I do not knw how.  Mother is also requesting more respite hours in addition to what she was given under CAP/C and I will ask about this as well.    Equipment needs:  Due to patient's medical condition, patient is indefinitely incontinent of stool and urine.  It is medically necessary for them to use diapers, underpads, and gloves to assist with hygiene and skin integrity.     Decision making/Advanced care planning: Not addressed at this visit, patient remains at full code.    The CARE PLAN for reviewed and revised to represent the  changes above.  This is available in Epic under snapshot, and a physical binder provided to the patient, that can be used for anyone providing care for the patient.   I spent 30 minutes on day of service on this patient including review of chart, discussion with patient and family, discussion of screening results, coordination with other providers and management of orders and paperwork.     Return in about 5 weeks (around 05/23/2022).  Carylon Perches MD MPH Neurology,  Neurodevelopment and Neuropalliative care [name] Child Neurology  [address] [place], [place], [place] [address] Phone: [phone] Fax: [phone]

## 2022-04-18 ENCOUNTER — Telehealth (INDEPENDENT_AMBULATORY_CARE_PROVIDER_SITE_OTHER): Payer: Medicaid Other | Admitting: Pediatrics

## 2022-04-18 ENCOUNTER — Encounter (INDEPENDENT_AMBULATORY_CARE_PROVIDER_SITE_OTHER): Payer: Self-pay | Admitting: Pediatrics

## 2022-04-18 VITALS — Wt 105.0 lb

## 2022-04-18 DIAGNOSIS — G7101 Duchenne or Becker muscular dystrophy: Secondary | ICD-10-CM | POA: Diagnosis not present

## 2022-04-18 DIAGNOSIS — J984 Other disorders of lung: Secondary | ICD-10-CM

## 2022-04-18 DIAGNOSIS — G40209 Localization-related (focal) (partial) symptomatic epilepsy and epileptic syndromes with complex partial seizures, not intractable, without status epilepticus: Secondary | ICD-10-CM

## 2022-04-18 DIAGNOSIS — M2450 Contracture, unspecified joint: Secondary | ICD-10-CM

## 2022-04-18 DIAGNOSIS — F840 Autistic disorder: Secondary | ICD-10-CM

## 2022-04-18 DIAGNOSIS — G7100 Muscular dystrophy, unspecified: Secondary | ICD-10-CM

## 2022-04-18 DIAGNOSIS — K5900 Constipation, unspecified: Secondary | ICD-10-CM | POA: Diagnosis not present

## 2022-04-18 DIAGNOSIS — G4733 Obstructive sleep apnea (adult) (pediatric): Secondary | ICD-10-CM | POA: Diagnosis not present

## 2022-04-18 DIAGNOSIS — Z931 Gastrostomy status: Secondary | ICD-10-CM

## 2022-04-18 MED ORDER — POLYETHYLENE GLYCOL 3350 17 GM/SCOOP PO POWD: 17.0000 g | Freq: Two times a day (BID) | ORAL | 11 refills | Status: DC

## 2022-04-18 MED ORDER — CARBAMAZEPINE 100 MG PO CHEW: CHEWABLE_TABLET | ORAL | 5 refills | Status: DC

## 2022-05-02 ENCOUNTER — Telehealth (INDEPENDENT_AMBULATORY_CARE_PROVIDER_SITE_OTHER): Payer: Self-pay

## 2022-05-02 NOTE — Telephone Encounter (Signed)
Fax stating 3rd request for information relating to need for a special bed. Letter and MD note was written, signed and faxed but to [place] different fax number.  Letter refaxed to [facility], note also sent from 04/15/2022. Secure Email to [employer] with note as well.

## 2022-05-09 NOTE — Progress Notes (Addendum)
Patient: Lucas [name] MRN: [id number] Sex: male DOB: 03/11/2001  Provider: [name], MD Location of Care: Pediatric [facility]ist- Pediatric Complex Care Note type: Routine return visit   This is a Pediatric Specialist E-Visit follow up consult provided via MyChart Lucas [name] and their parent/guardian Lucas [name] consented to an E-Visit consult today.  Location of patient: Lucas [name] is at home in [place] Location of provider: [name], MD is at [facility]  The following participants were involved in this E-Visit:  [name], MD, Lucas [name], RMA, Mayra Reel, Scribe, Lucas [name], patient, [facility] case manager, and their parent/guardian Lucas [name]  This visit was done via VIDEO    History of Present Illness: Referral Source: Lucas Schlichter, MD  History from: patient and prior records Chief Complaint: Pediatric Complex Care   Lucas [name] is a 21 y.o. male with history of Duchenne Muscular Dystrophy, autism spectrum disorder, level 3, restrictive lung disease, compensated congestive heart failure, severe dysphagia requiring tube feeding, and obstructive sleep apnea and a recent pathologic fracture of the proximal one third left humerus who I am seeing in follow-up for complex care management. Patient was last seen 04/18/22 where we discussed transition of care once he ages out of cap-c for which he follows up today as well.   Patient presents today with mom and his cap-c case manager Lucas [name]. They report their largest concern is his transition off of [employer] once he turns 21.   Symptom Management:  Mom reports they are having trouble with him intentionally spitting. She does not feel that it is due to too much saliva.   No seizures since the last visit. Stools every day, sometimes it can be small. No rash issues. She has realized that he is not vomiting, but is spitting.   Care management needs:  Since the last visit, [place] informed , theres no  order needed to continue with nursing, once he ages out, they will continue with nursing automatically. However, the only way they know to keep his extra respite hours would be to apply through [employer] or innovations, and this would cause him to lose his nursing.  [employer] case manager is working on getting a grant through the city of [place] to help repair the house. It is needing repairs on the roof and is having trouble with the fuses. She also occasionally has trouble with the water. She also reports that she is behind on her property taxes. Lucas [name] and Lucas [name] with cap-c are working on creating a notebook to assist with all of the paperwork that Mom will need to complete. Mom is able to set up medicaid transportation, and confirms that she knows how to set this up.   Equipment:  Mom reports that his vest is broken, with holes in it, she is needing to call [employer] to replace it. Mom also reports that the pulse ox is broken and she is needing a new one.   Past Medical History Past Medical History:  Diagnosis Date   Autism    Autism    Community acquired pneumonia of left lower lobe of lung 03/03/2022   Diarrhea 06/09/2015   Family history of adverse reaction to anesthesia    MGGM- N/V   Muscular dystrophy (HCC)    Scoliosis    Seizures (HCC)    Last one 2013   Sepsis (HCC) 03/03/2022   Transient alteration of awareness    Viral gastroenteritis    Yeast infection of the skin 10/24/2020  Surgical History Past Surgical History:  Procedure Laterality Date   CIRCUMCISION     at birth   LAPAROSCOPIC GASTROSTOMY N/A 05/24/2017   Procedure: LAPAROSCOPIC GASTROSTOMY TUBE PLACEMENT;  Surgeon: Stanford Scotland, MD;  Location: [facility];  Service: General;  Laterality: N/A;   SPINE SURGERY N/A    Phreesia 06/29/2020    Family History family history includes Alcohol abuse in his father; Asthma in an other family member; Cancer in his paternal grandfather; Dementia in his paternal grandmother; Heart  failure in his maternal grandmother; Hyperlipidemia in his mother and another family member; Hypertension in his maternal grandmother and another family member; Learning disabilities in his mother; Other in his maternal aunt, maternal grandmother, maternal uncle, and mother; Vision loss in an other family member.   Social History Social History   Social History Narrative   He lives with mother.   He graduated high school in 22.    He is not currently in a day program    He currently is in PT (at home) mom upset they are going to lose this service when he turns 21.     Allergies Allergies  Allergen Reactions   Penicillins Hives and Rash    Has patient had a PCN reaction causing immediate rash, facial/tongue/throat swelling, SOB or lightheadedness with hypotension: Yes Has patient had a PCN reaction causing severe rash involving mucus membranes or skin necrosis: Yes Has patient had a PCN reaction that required hospitalization: No Has patient had a PCN reaction occurring within the last 10 years: No If all of the above answers are "NO", then may proceed with Cephalosporin use.    Docusate Sodium     Rash and cant sleep   Multivitamins     Red in face, itchy, bumps   Other Other (See Comments)    Senkot -- gets rash and cant sleep   Calcitonin Rash   Esomeprazole Magnesium Nausea And Vomiting   Omeprazole Other (See Comments)    Constipation and possible rash after drug was stopped   Simethicone Palpitations and Rash   Sunflower Oil Itching, Rash and Other (See Comments)    GI upset - Avoid products containing this additive   Vitamin D Analogs Other (See Comments)    Rash on face if it contains sunflower seed oil Excessive urine output and thirsty, increase in gas    Medications Current Outpatient Medications on File Prior to Visit  Medication Sig Dispense Refill   carbamazepine (TEGRETOL) 100 MG chewable tablet TAKE 1 & 1/2 TABLETS BY MOUTH TWICE A DAY 90 tablet 5    polyethylene glycol powder (GOODSENSE CLEARLAX) 17 GM/SCOOP powder Take 17 g by mouth 2 (two) times daily. With prune juice 476 g 11   prednisoLONE (ORAPRED) 15 MG/5ML solution PLACE 3 ML INTO FEEDING TUBE DAILY 90 mL 11   acetaminophen (TYLENOL) 160 MG/5ML solution Take 10 mLs (320 mg total) by mouth every 6 (six) hours as needed for mild pain, moderate pain, fever or headache. (Patient not taking: Reported on 04/15/2022) 473 mL 1   levalbuterol (XOPENEX) 1.25 MG/3ML nebulizer solution Take 1.25 mg by nebulization every 4 (four) hours as needed. (Patient not taking: Reported on 04/15/2022)     Nutritional Supplements (NUTRITIONAL SUPPLEMENT PLUS) LIQD 3 cartons (888 mL) Ensure Complete given by tube daily. (Patient taking differently: 4 cartons  Ensure Plus given by tube daily.) 27528 mL 12   sodium chloride HYPERTONIC 3 % nebulizer solution Inhale into the lungs. (Patient not taking: Reported  on 04/15/2022)     No current facility-administered medications on file prior to visit.   The medication list was reviewed and reconciled. All changes or newly prescribed medications were explained.  A complete medication list was provided to the patient/caregiver.  Physical Exam There were no vitals taken for this visit. Weight for age: Facility age limit for growth %iles is 20 years.  Length for age: Facility age limit for growth %iles is 20 years. BMI: There is no height or weight on file to calculate BMI. No results found. Limited exam General: NAD, well nourished. HEENT: normocephalic, no eye or nose discharge.  MMM  Lungs: Normal work of breathing, no rhonchi or stridor Neuro: Interactive with examiner on video, limited speech. EOM intact, face symmetric. Wheelchair dependent.    Diagnosis: No diagnosis found.   Assessment and Plan DAMARE SERANO is a 21 y.o. male with history of Duchenne Muscular Dystrophy, autism spectrum disorder, level 3, restrictive lung disease, compensated congestive heart  failure, severe dysphagia requiring tube feeding, and obstructive sleep apnea and a recent pathologic fracture of the proximal one third left humerus who presents for follow-up in the pediatric complex care clinic. I discussed case with all involved parties for coordination of care and recommend patient follow their instructions as below.   Symptom management:  Seizures are well controlled on current medication regimen. Continued this today. Discussed spitting behavior with mom and this seems to be behavioral rather than due to an excess of saliva. Discussed managing this with behavioral intervention.   Care management needs:  Patient will be graduating from cap-c on 06/30/22. Discussed with mom that she will loose her case management when this happens and she will be responsible for organizing taxes, setting up transportation, and finishing applying a grant for repairs for the home.   Equipment needs:  Made plan with mom for her to reach out to [place] [employer] to replace her pulse ox machine with plan to follow up on this with our case manager as well.   The CARE PLAN for reviewed and revised to represent the changes above.  This is available in Epic under snapshot, and a physical binder provided to the patient, that can be used for anyone providing care for the patient.   I spent 30 minutes on day of service on this patient including review of chart, discussion with patient and family, discussion of screening results, coordination with other providers and management of orders and paperwork.     Return in about 3 months (around 08/19/2022).  I, Scharlene Gloss, scribed for and in the presence of Carylon Perches, MD at today's visit on 05/19/2022.   I, Carylon Perches MD MPH, personally performed the services described in this documentation, as scribed by Scharlene Gloss in my presence on 05/19/2022 and it is accurate, complete, and reviewed by me.    Carylon Perches MD MPH Neurology,  Neurodevelopment and  Neuropalliative care [facility] Child Neurology  [address] [place], [place], [place] [address] Phone: [phone] Fax: [phone]

## 2022-05-12 ENCOUNTER — Encounter (INDEPENDENT_AMBULATORY_CARE_PROVIDER_SITE_OTHER): Payer: Self-pay | Admitting: Pediatrics

## 2022-05-13 ENCOUNTER — Telehealth (INDEPENDENT_AMBULATORY_CARE_PROVIDER_SITE_OTHER): Payer: Self-pay

## 2022-05-13 NOTE — Telephone Encounter (Signed)
Phone note routed to clinical staff

## 2022-05-16 ENCOUNTER — Telehealth (INDEPENDENT_AMBULATORY_CARE_PROVIDER_SITE_OTHER): Payer: Self-pay | Admitting: Pediatrics

## 2022-05-16 NOTE — Telephone Encounter (Signed)
Appointment made virtual. Mom made aware.

## 2022-05-16 NOTE — Telephone Encounter (Signed)
  Name of who is calling: Holley Bouche   Caller's Relationship to Patient: Mom  Best contact number:[phone]  Provider they see: [name]  Reason for call: Transportation isn't  scheduled for his appt on 6/8 and they dont want to miss it so they were asking if their appt could be changed to a virtual visit instead.      PRESCRIPTION REFILL ONLY  Name of prescription:  Pharmacy:

## 2022-05-19 ENCOUNTER — Ambulatory Visit (INDEPENDENT_AMBULATORY_CARE_PROVIDER_SITE_OTHER): Payer: Medicaid Other | Admitting: Dietician

## 2022-05-19 ENCOUNTER — Telehealth (INDEPENDENT_AMBULATORY_CARE_PROVIDER_SITE_OTHER): Payer: Medicaid Other | Admitting: Pediatrics

## 2022-05-19 DIAGNOSIS — G7100 Muscular dystrophy, unspecified: Secondary | ICD-10-CM

## 2022-05-19 DIAGNOSIS — M2450 Contracture, unspecified joint: Secondary | ICD-10-CM

## 2022-05-19 DIAGNOSIS — G7101 Duchenne or Becker muscular dystrophy: Secondary | ICD-10-CM | POA: Diagnosis not present

## 2022-05-19 DIAGNOSIS — G40209 Localization-related (focal) (partial) symptomatic epilepsy and epileptic syndromes with complex partial seizures, not intractable, without status epilepticus: Secondary | ICD-10-CM

## 2022-05-19 DIAGNOSIS — K5900 Constipation, unspecified: Secondary | ICD-10-CM | POA: Diagnosis not present

## 2022-05-19 DIAGNOSIS — F840 Autistic disorder: Secondary | ICD-10-CM

## 2022-05-19 DIAGNOSIS — J984 Other disorders of lung: Secondary | ICD-10-CM

## 2022-05-19 DIAGNOSIS — Z931 Gastrostomy status: Secondary | ICD-10-CM

## 2022-05-19 DIAGNOSIS — G4733 Obstructive sleep apnea (adult) (pediatric): Secondary | ICD-10-CM

## 2022-05-30 ENCOUNTER — Encounter (INDEPENDENT_AMBULATORY_CARE_PROVIDER_SITE_OTHER): Payer: Self-pay | Admitting: Pediatrics

## 2022-06-01 ENCOUNTER — Telehealth: Payer: Self-pay | Admitting: Gastroenterology

## 2022-06-01 NOTE — Telephone Encounter (Signed)
Pt care giver called requesting a call back to discuss pt food for feeding tube he hasnt eaten in three days  Please call Ulice Dash - [phone] or cell [phone]

## 2022-06-01 NOTE — Telephone Encounter (Signed)
Patient nurse is calling because patient is not tolerating the Ensure. He was doing good on the Pedialyte and every since the change to the Ensure he has been throwing up all the feeds. He is wanting to know if they can go back using the Pedialyte.

## 2022-06-01 NOTE — Telephone Encounter (Signed)
Called and left a detail message informing him it was okay to do the change

## 2022-06-02 ENCOUNTER — Telehealth: Payer: Self-pay

## 2022-06-02 NOTE — Telephone Encounter (Signed)
Patient nurse Butch Penny is calling to find out if they need to increase the number of feeds for the patient since he is changing to Pedialyte instead of ensure. She states that the Ensure had 350 calories each feed and 13 grams of protein. He was using this 4 times a day. She states the Pedialyte is 240 calories for each feed and 7 grams of protein. Please advise how many feeds a day you want him to have

## 2022-06-02 NOTE — Telephone Encounter (Signed)
Requesting a call back from [place]. Would not give any further information.

## 2022-06-02 NOTE — Telephone Encounter (Signed)
Called and left a message for call back  

## 2022-06-03 ENCOUNTER — Telehealth: Payer: Self-pay

## 2022-06-03 MED ORDER — PEDIASURE GROW & GAIN PO LIQD: ORAL | 12 refills | Status: DC

## 2022-06-06 ENCOUNTER — Ambulatory Visit (INDEPENDENT_AMBULATORY_CARE_PROVIDER_SITE_OTHER): Payer: Medicaid Other | Admitting: Pediatrics

## 2022-06-06 ENCOUNTER — Telehealth: Payer: Self-pay

## 2022-06-06 NOTE — Telephone Encounter (Signed)
Lupita Leash a nurse with the patient is calling because he has been on the Pediasure since Thursday and he is still spitting up after each meal. They have decreased the water intake before and after a feed to see if that helps. They are wanting to know what else you recommend to help him not spit up

## 2022-06-07 ENCOUNTER — Other Ambulatory Visit: Payer: Self-pay

## 2022-06-07 DIAGNOSIS — Z931 Gastrostomy status: Secondary | ICD-10-CM

## 2022-06-07 MED ORDER — PANTOPRAZOLE SODIUM 40 MG PO TBEC: 40.0000 mg | DELAYED_RELEASE_TABLET | Freq: Every day | ORAL | 3 refills | Status: DC

## 2022-06-08 ENCOUNTER — Telehealth: Payer: Self-pay

## 2022-06-08 MED ORDER — PEDIASURE GROW & GAIN PO LIQD: ORAL | 12 refills | Status: DC

## 2022-06-08 NOTE — Telephone Encounter (Signed)
Ulice Dash with the patient care as if we faxed the orders for the [place]. Informed him yes we did on Friday and gave him the fax number we fax it to. He says that is the wrong fax number and we need to fax it

## 2022-06-16 ENCOUNTER — Telehealth: Payer: Self-pay

## 2022-06-16 NOTE — Telephone Encounter (Signed)
Lucas [name] Patient nurse states he is doing much better with the spitting up since he has change to [name]. He is only spitting up very little with the first feed of the morning

## 2022-06-21 ENCOUNTER — Telehealth: Payer: Self-pay

## 2022-06-21 NOTE — Telephone Encounter (Signed)
Butch Penny patient nurse called and patient mom wants to cancel his EGD she does not want to have this done for him. Called Endo and talk to Mark and she will cancel procedure

## 2022-06-23 ENCOUNTER — Encounter: Admission: RE | Payer: Self-pay | Source: Home / Self Care

## 2022-06-23 ENCOUNTER — Telehealth: Payer: Self-pay

## 2022-06-23 ENCOUNTER — Ambulatory Visit: Admission: RE | Admit: 2022-06-23 | Payer: Medicaid Other | Source: Home / Self Care | Admitting: Gastroenterology

## 2022-06-23 SURGERY — ESOPHAGOGASTRODUODENOSCOPY (EGD) WITH PROPOFOL
Anesthesia: General

## 2022-06-23 NOTE — Telephone Encounter (Signed)
Called patient nurse Butch Penny about the form we received for patient Lucas [name] form to be filled out. She states she does not know anything about this form and that we would need to call the number on the form.  They said they are going to fax over a sample form to [place] so we know what to put on the form.

## 2022-07-20 ENCOUNTER — Telehealth: Payer: Self-pay | Admitting: Gastroenterology

## 2022-07-20 ENCOUNTER — Telehealth: Payer: Self-pay

## 2022-07-20 NOTE — Telephone Encounter (Signed)
We can switch from Protonix to omeprazole 40 mg daily.  However, I still recommend upper endoscopy to evaluate the G-tube site from inside the stomach as well as his esophagus.  If she prefers Lucas [name] to be seen in the office first, I can see him after I return from vacation  RV

## 2022-07-20 NOTE — Telephone Encounter (Signed)
PT caregiver called with questions in ref to his feeding tube called was transferred to [facility]

## 2022-07-20 NOTE — Telephone Encounter (Signed)
Called and left a message for call back  

## 2022-07-20 NOTE — Telephone Encounter (Signed)
Lucas [name] patient nurse is calling because patient is spitting up off and on still watery discharge. He states sometimes it is after feeds and sometimes it is random. Explained to him that next steps would to get the EGD done but the mom canceled the procedure previously. The mom was with him and she again said that she does not want to put her son through that. Explained to her that is the only way we can tell what is going on. Lucas [name] asked if we could change his pantoprazole to something else or what else you recommended

## 2022-07-21 ENCOUNTER — Other Ambulatory Visit: Payer: Self-pay

## 2022-07-21 DIAGNOSIS — Z931 Gastrostomy status: Secondary | ICD-10-CM

## 2022-07-21 NOTE — Telephone Encounter (Signed)
Called and left a message for call back  

## 2022-07-21 NOTE — Telephone Encounter (Signed)
Patient mom agreed to EGD and schedule for 08/17/2022

## 2022-07-27 ENCOUNTER — Telehealth: Payer: Self-pay

## 2022-07-27 NOTE — Telephone Encounter (Signed)
Jake a nurse with the care of patient is calling because mom wants to know if he really needs to have the EGD done. She states he has not spit up in 5 days. Informed them I would have to ask the provider but she on vacation till 08/08/2022 and it would be then when I respond. He verbalized understanding

## 2022-07-27 NOTE — Telephone Encounter (Signed)
Lucas [name] called back and states she talk to the mom and she wants to cancel the procedure due to no spitting up. I informed him this seems to be a on and off issue and we need to do this procedure but will cancel the procedure. He states he knows and he explained this to her and she wants it cancel. Called trish and got EGD cancel

## 2022-08-17 ENCOUNTER — Ambulatory Visit: Admit: 2022-08-17 | Payer: Medicaid Other | Admitting: Gastroenterology

## 2022-08-17 ENCOUNTER — Telehealth (INDEPENDENT_AMBULATORY_CARE_PROVIDER_SITE_OTHER): Payer: Self-pay | Admitting: Pediatrics

## 2022-08-17 ENCOUNTER — Encounter (INDEPENDENT_AMBULATORY_CARE_PROVIDER_SITE_OTHER): Payer: Self-pay | Admitting: Pediatrics

## 2022-08-17 ENCOUNTER — Telehealth: Payer: Self-pay

## 2022-08-17 SURGERY — ESOPHAGOGASTRODUODENOSCOPY (EGD) WITH PROPOFOL
Anesthesia: General

## 2022-08-17 NOTE — Telephone Encounter (Signed)
Who's calling (name and relationship to patient) : Lucas [name] Signer mom   Best contact number: [phone]  Provider they see: [name]  Reason for call: Mom is having issues getting medication because she states that it is too expensive for her. Please call mom with possible solutions   Call ID:      PRESCRIPTION REFILL ONLY  Name of prescription:  Pharmacy:

## 2022-08-17 NOTE — Telephone Encounter (Signed)
Lucas [name] patient nurse called and left a voicemail wanting me to return his call for the patient. Called Lucas [name] back and left a message for call back
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
## 2022-08-18 ENCOUNTER — Telehealth (INDEPENDENT_AMBULATORY_CARE_PROVIDER_SITE_OTHER): Payer: Self-pay | Admitting: Pediatrics

## 2022-08-18 MED ORDER — PANTOPRAZOLE SODIUM 40 MG PO TBEC: 40.0000 mg | DELAYED_RELEASE_TABLET | Freq: Every day | ORAL | 3 refills | Status: DC

## 2022-08-18 NOTE — Addendum Note (Signed)
Addended by: Ulyess Blossom L on: 08/18/2022 01:13 PM   Modules accepted: Orders

## 2022-08-18 NOTE — Telephone Encounter (Signed)
Please refill Protonix 40 mg daily Yes, they can lower the speed of the tube feeds I do not recommend any carbonated beverages Hope this answers their questions  RV

## 2022-08-18 NOTE — Telephone Encounter (Signed)
Patient nurse is calling because he states they have been trying to lower the speed of his feeds and on 274ml he spits up less. They also say that he has been drink sprite which is sugar free and he spits up after he drinks it. Yesterday they switch him to tea and he is not spitting up really after the tea. They are wanting to know if the soda could cause him to spit up and it they could lower the speed of the feed. Also is wondering if a refill on Pantoprazole be called in to pharmacy.   Call nurse Maylon Cos today or mom on home number or cell phone tomorrow. [phone]

## 2022-08-18 NOTE — Telephone Encounter (Signed)
  Name of who is calling:Tracy   Caller's Relationship to Patient:Mother   Best contact number:[phone]  Provider they see:[name]   Reason for call:mom called requesting a call back as soon as possible. She stated that nobody has returned and she will be out of seizure medication soon  because she cant afford it. Please call mom back.      PRESCRIPTION REFILL ONLY  Name of prescription:  Pharmacy:

## 2022-08-18 NOTE — Telephone Encounter (Signed)
Called the pharmacy to see what was gong on with patients TEGRETOL.   Verified pt's name and DOB with Coralyn Mark, Pharmacy Tech.   Coralyn Mark stated that pt's TEGRETOL is ready to be picked up and the copay for that medication is $4.00.   Coralyn Mark also state that since the pt's turned 21 his co-pay increased from nothing to $4.00. She couldn't give a definite reason as to why.  I thanked the Pharmacy tech and ended the call.   Contatced pt's mom.  Verified pt's name and DOB, as well as moms name.   I relayed the information received from the pharmacy tech to mom.   Mom stated that she couldn't afford it. She stated that she is on a fixed income and by the middle of the month she wouldn't be able to retreive refills from the pharmacy.  Mom also stated that the pt isn't currently out of TEGRETOL. She states that he has a few weeks of medication left but, she doesn't know what she's going to do when he runs out.   Mom and Pharmacy Tech both confirmed that the pt still has [employer].   I informed mom that I would inform the provider and see if there are any alternatives. Also informed mom that I will give her another call by the end of business today just to keep her updated.  SS, CCMA.

## 2022-08-18 NOTE — Telephone Encounter (Signed)
Refilled medication. Called [place] and left a detail message for him with the answer of his questions

## 2022-08-19 NOTE — Telephone Encounter (Signed)
Patient discussed directly with CMA.  It appears the $4 is for all medications under their insurance.  I could change to another antiepileptic, but this would not change the cost for the patient.  Patient is scheduled to see me Thursday 08/25/22 where we can discuss further but mother will need to either spread costs to cover necessary medications or work with medicaid case Freight forwarder to increase medicaid coverage.  There is nothing this office can do.    Carylon Perches MD MPH

## 2022-08-24 ENCOUNTER — Telehealth: Payer: Self-pay

## 2022-08-24 NOTE — Telephone Encounter (Signed)
Jake Patient nurse called and said they had some questions for the provider. Called Jake back and left a message for call back

## 2022-08-25 ENCOUNTER — Telehealth (INDEPENDENT_AMBULATORY_CARE_PROVIDER_SITE_OTHER): Payer: Self-pay | Admitting: Pediatrics

## 2022-08-25 ENCOUNTER — Encounter (INDEPENDENT_AMBULATORY_CARE_PROVIDER_SITE_OTHER): Payer: Self-pay | Admitting: Pediatrics

## 2022-08-25 ENCOUNTER — Encounter (INDEPENDENT_AMBULATORY_CARE_PROVIDER_SITE_OTHER): Payer: Self-pay

## 2022-08-25 ENCOUNTER — Ambulatory Visit (INDEPENDENT_AMBULATORY_CARE_PROVIDER_SITE_OTHER): Payer: Medicaid Other | Admitting: Pediatrics

## 2022-08-25 VITALS — BP 100/74 | HR 100 | Wt 110.4 lb

## 2022-08-25 DIAGNOSIS — M2450 Contracture, unspecified joint: Secondary | ICD-10-CM

## 2022-08-25 DIAGNOSIS — G40209 Localization-related (focal) (partial) symptomatic epilepsy and epileptic syndromes with complex partial seizures, not intractable, without status epilepticus: Secondary | ICD-10-CM | POA: Diagnosis not present

## 2022-08-25 DIAGNOSIS — R569 Unspecified convulsions: Secondary | ICD-10-CM

## 2022-08-25 DIAGNOSIS — Z931 Gastrostomy status: Secondary | ICD-10-CM

## 2022-08-25 DIAGNOSIS — G7101 Duchenne or Becker muscular dystrophy: Secondary | ICD-10-CM | POA: Diagnosis not present

## 2022-08-25 MED ORDER — PREDNISOLONE SODIUM PHOSPHATE 15 MG/5ML PO SOLN: ORAL | 3 refills | Status: DC

## 2022-08-25 MED ORDER — CARBAMAZEPINE 100 MG PO CHEW: CHEWABLE_TABLET | ORAL | 5 refills | Status: DC

## 2022-08-25 NOTE — Patient Instructions (Addendum)
Prescriptions:  I refilled his Tegretol and Prednisolone with 90 day supplies.   Other Doctors:  Call Dr. Joaquim Lai with [facility] orthopedics to schedule a virtual visit. If he needs x-rays we can get them done in [place]. That phone number is [phone].  Also call Dr. Edison Simon with the neurology/Muscular dystrophy clinic at [facility] to [place] a virtual visit. Their phone number is [phone].  Talk with Dr. Baldemar Lenis about switching him to prescriptions of Nystatin, Protonix, and Miralax to 90 day supplies. Also ask him about RSV, Flu, and Covid-19 booster shots.   Equipment: Call [facility] to ask them about fixing or replacing his vest.  I placed an order for a new pulse ox today, through advanced.  I also placed orders for the plug in for the joey feeding pump, the feeding bags, g-tubes, as well as to discontinue the zebex pump.  Try to get him to wear his hand splints again now that he is more comfortable.  Provided some of the old tubes for you today.    The three places in [place] that do adult day care are:   [facility].  Director: Lucendia Herrlich  Phone: [phone]  Website: [email or web address]  Director: Graciella Freer  Phone: [phone]  [facility]  Director: Lucas Mallow  Phone: [phone]

## 2022-08-25 NOTE — Telephone Encounter (Signed)
Who's calling (name and relationship to patient) :Lucas [name]; mom  Best contact number: [phone]  Provider they see: [name]   Reason for call: [name] is calling in to get a Letter sent to [facility] to get more hours for nursing to come in from [facility], he only gets 84 hours for them to come.   Call ID:      PRESCRIPTION REFILL ONLY  Name of prescription:  Pharmacy:

## 2022-08-26 ENCOUNTER — Telehealth (INDEPENDENT_AMBULATORY_CARE_PROVIDER_SITE_OTHER): Payer: Self-pay | Admitting: Pediatrics

## 2022-08-26 NOTE — Telephone Encounter (Signed)
Contacted pt's mother. Verified pt's name and DOB, as well as mom's name.  Called mom to confirm that we received the message about obtain more nurse hours. Let mom know that the message was sent to provider staff. The letter is still being drafted.   Mom stated that she believes that they will only approve 112 hours and she didn't know who to send the letter to.  She called the company [employer] while on the phone with me and spoke with a representative by the name of Brayton Layman, Brayton Layman stated that we can send the letter to them and they will upload it to their insurance.   ATTNBrayton Layman  or Leota Jacobsen Fax [phone]

## 2022-08-26 NOTE — Telephone Encounter (Deleted)
  Name of who is calling: Tracey  Caller's Relationship to Patient: mom   Best contact number: [phone]  Provider they see: [name]  Reason for call: [name] is calling back in regards to more nursing hours.

## 2022-08-26 NOTE — Telephone Encounter (Signed)
  Name of who is calling: Lucas [name] Relationship to Patient: mom  Best contact number: [phone]  Provider they see: [name]  Reason for call: mom is calling in regards to getting more nursing hours.

## 2022-08-29 ENCOUNTER — Encounter (INDEPENDENT_AMBULATORY_CARE_PROVIDER_SITE_OTHER): Payer: Self-pay | Admitting: Pediatrics

## 2022-09-01 ENCOUNTER — Telehealth (INDEPENDENT_AMBULATORY_CARE_PROVIDER_SITE_OTHER): Payer: Self-pay

## 2022-09-01 NOTE — Telephone Encounter (Signed)
Attempted to contact pt's mom to inform her that we have faxed over the letter requested to [facility].   Mom was unable to be reached LVM to call our office back.   SS, CCMA

## 2022-09-01 NOTE — Telephone Encounter (Signed)
Mom called back.  Verified pt's name and DOB as well as mom's name.  Informed mom that letter was Faxed to [place].   Mom verbalized understanding of this.  SS, CCMA

## 2022-09-01 NOTE — Telephone Encounter (Signed)
Letter signed, printed and returned to [facility] to fax.   Carylon Perches MD MPH

## 2022-09-04 ENCOUNTER — Encounter (INDEPENDENT_AMBULATORY_CARE_PROVIDER_SITE_OTHER): Payer: Self-pay | Admitting: Pediatrics

## 2022-09-08 ENCOUNTER — Telehealth: Payer: Self-pay

## 2022-09-08 DIAGNOSIS — K5900 Constipation, unspecified: Secondary | ICD-10-CM

## 2022-09-08 NOTE — Telephone Encounter (Signed)
Tried to call Maylon Cos the nurse for the patient he answer but could hear him

## 2022-09-08 NOTE — Telephone Encounter (Signed)
Patient nurse Lucas [name] is calling because he wants to know if this is normal for his heart rate to be elevated after he takes the Pantoprazole $RemoveBeforeDE'40mg'XgnUyBqCtSWwnkr$  in the morning. He states for a hour or two after the medications the heart rate will be anywhere from 120 to 130. He states he took the medication today 30 minutes ago and his heart rate is 128 right now.  They also wants to know if they can get 90 days supplies called in to the pharmacy for pantoprazole and Miralax.

## 2022-09-08 NOTE — Telephone Encounter (Signed)
Advice to stop Pantoprazole $RemoveBeforeDEI'40mg'KJpgciCaxjcHhRjJ$   RV

## 2022-09-08 NOTE — Telephone Encounter (Signed)
Try Pepcid 20 mg twice daily and okay to send in prescription for MiraLAX, both 90-day supply with refills  RV

## 2022-09-08 NOTE — Telephone Encounter (Signed)
They want to know if you want to change to another medication for the GERD if so can it be a 90 day supply and also can miralax be 90 days.

## 2022-09-09 MED ORDER — POLYETHYLENE GLYCOL 3350 17 GM/SCOOP PO POWD: 17.0000 g | Freq: Two times a day (BID) | ORAL | 3 refills | Status: DC

## 2022-09-09 MED ORDER — FAMOTIDINE 20 MG PO TABS: 20.0000 mg | ORAL_TABLET | Freq: Two times a day (BID) | ORAL | 0 refills | Status: DC

## 2022-09-09 NOTE — Addendum Note (Signed)
Addended by: Ulyess Blossom L on: 09/09/2022 08:24 AM   Modules accepted: Orders

## 2022-09-09 NOTE — Telephone Encounter (Signed)
Sent 90 days to the pharmacy. Informed patient mom of the change and medications being sent

## 2022-09-12 ENCOUNTER — Telehealth: Payer: Self-pay

## 2022-09-12 NOTE — Addendum Note (Signed)
Addended by: Ulyess Blossom L on: 09/12/2022 12:39 PM   Modules accepted: Orders

## 2022-09-12 NOTE — Telephone Encounter (Signed)
If he cannot tolerate any antacids, we need to prove that he has acid reflux by doing an upper endoscopy before we can try any other acid reflux medications  The only other option is sucralfate suspension 3-4 times daily as needed  RV

## 2022-09-12 NOTE — Telephone Encounter (Signed)
Called patient mother and she states she will not do a EGD and he just will not be on any medication

## 2022-09-12 NOTE — Telephone Encounter (Signed)
Patient mother states that patient can not take Pepcid because he took this a year ago and it made him vomit every time he takes it. She states that Nexium does the same thing.

## 2022-09-14 ENCOUNTER — Telehealth: Payer: Self-pay

## 2022-09-14 DIAGNOSIS — Z931 Gastrostomy status: Secondary | ICD-10-CM

## 2022-09-14 NOTE — Telephone Encounter (Signed)
We received a form to filled out from [facility]. Dr. Marius Ditch states she can not filled this out till he is seen in the office and he needs a referral to a dietician. Mom states she agrees with the plan because he is getting 6 feeds a day and is gaining weight.

## 2022-09-19 ENCOUNTER — Ambulatory Visit: Payer: Medicaid Other | Admitting: Gastroenterology

## 2022-09-19 ENCOUNTER — Telehealth: Payer: Self-pay

## 2022-09-19 NOTE — Telephone Encounter (Signed)
First time I can schedule patient is 10/11/2022. Reschedule patient to then.

## 2022-09-19 NOTE — Telephone Encounter (Signed)
Pt's mother, Olivia Mackie called regarding appt today. Their ride did not show up to pick him up. She would like to reschedule. Dr. Verlin Grills schedule is full. She needs a Wednesday or Thursday.   Please call [phone].

## 2022-09-21 ENCOUNTER — Telehealth: Payer: Self-pay

## 2022-09-21 ENCOUNTER — Telehealth (INDEPENDENT_AMBULATORY_CARE_PROVIDER_SITE_OTHER): Payer: Self-pay | Admitting: Pediatrics

## 2022-09-21 NOTE — Telephone Encounter (Signed)
Patient mom called and left a voicemail to call her back. Called mom back and she states Jake the nurse wants to talk to me. Maylon Cos came on the phone and states that patient is gaining weight on his feeds especially in his arms and stomach. He asked what we thought about dropping a feed. Informed him that we could not give him that information till he is seen in the office. He asked if they did what we would say. Informed him again we could not give any instruction till he is seen. Mom asked if we could do a mychart visit. Informed her no for the form that needs to be filled out we need documentation in person for insurance to pay for the feeds.

## 2022-09-21 NOTE — Telephone Encounter (Signed)
In the last face to face visit 08/25/22 in the H&P it is noted mom reported: He has not been wearing his hand splints as he was not tolerating them before.   Then in the Assessment and plan you recommended:  - Advised that they try to use his hand splints more often.  I am not sure if insurance will accept this documentation for hand splints, but we could try and if it gets denied then we can schedule a visit to document it further. Or would you prefer to schedule another visit before ordering?  Next face to face is 11/24/22.

## 2022-09-21 NOTE — Telephone Encounter (Signed)
  Name of who is calling: Victorio Palm Relationship to Patient: Mom  Best contact number: [phone]  Provider they see: [facility]  Reason for call: Mom called and stated that Lucas [name]'s Hand Splint has gotten to small for him. Mom said he has gained weight. She's wanting to know if provider could prescribe a bigger size. Mom is requesting a callbac.     PRESCRIPTION REFILL ONLY  Name of prescription:   Pharmacy: [facility] in [place]

## 2022-09-22 NOTE — Telephone Encounter (Signed)
Recommended patient use hand splints at the last appointment, I addended note to clarify that we are ordering new, not to use the old ones. New DME referral placed.    Carylon Perches MD MPH

## 2022-09-22 NOTE — Telephone Encounter (Addendum)
Contacted pt's mom. Verified pt's name and DOB as well as moms name.  Informed mom that we received her message and that we will be sending a new referral to [facility] for new Hand splints.  Mom verbalized understanding of this.  SS, CCMA

## 2022-09-22 NOTE — Addendum Note (Signed)
Addended by: Rocky Link on: 09/22/2022 02:08 PM   Modules accepted: Orders

## 2022-10-11 ENCOUNTER — Ambulatory Visit: Payer: Medicaid Other | Admitting: Gastroenterology

## 2022-10-17 ENCOUNTER — Ambulatory Visit: Payer: Medicaid Other | Admitting: Gastroenterology

## 2022-10-18 ENCOUNTER — Ambulatory Visit: Payer: Medicaid Other | Admitting: Gastroenterology

## 2022-10-25 ENCOUNTER — Telehealth: Payer: Self-pay

## 2022-10-25 NOTE — Telephone Encounter (Signed)
Home health at [phone] called to get orders for nursing and feeds. Informed her I could not give no orders till her seen. Informed her when his appointment is schedule

## 2022-10-28 ENCOUNTER — Ambulatory Visit (INDEPENDENT_AMBULATORY_CARE_PROVIDER_SITE_OTHER): Payer: Self-pay | Admitting: Pediatrics

## 2022-11-09 ENCOUNTER — Ambulatory Visit (INDEPENDENT_AMBULATORY_CARE_PROVIDER_SITE_OTHER): Payer: Medicaid Other | Admitting: Gastroenterology

## 2022-11-09 ENCOUNTER — Telehealth (INDEPENDENT_AMBULATORY_CARE_PROVIDER_SITE_OTHER): Payer: Self-pay | Admitting: Pediatrics

## 2022-11-09 ENCOUNTER — Encounter: Payer: Self-pay | Admitting: Gastroenterology

## 2022-11-09 VITALS — BP 112/76 | HR 94 | Temp 99.0°F | Ht 60.0 in | Wt 115.0 lb

## 2022-11-09 DIAGNOSIS — K5904 Chronic idiopathic constipation: Secondary | ICD-10-CM

## 2022-11-09 DIAGNOSIS — Z931 Gastrostomy status: Secondary | ICD-10-CM | POA: Diagnosis not present

## 2022-11-09 NOTE — Patient Instructions (Addendum)
Stop Pantoprazole $RemoveBeforeDEI'40mg'tzywuDEKnrWJqywj$  daily.  Administer Free water 120ML one hour after each feed.  Administer 4 cans daily of pediasure grow and gain Follow up in one year

## 2022-11-09 NOTE — Progress Notes (Signed)
Arlyss Repress, MD [address]  [address]  [place], [place] [address]  Main: [phone]  Fax: [phone]    Gastroenterology Consultation  Referring Provider:     Kandyce Rud, MD Primary Care Physician:  Kandyce Rud, MD Primary Gastroenterologist:  Dr. Arlyss Repress Reason for Consultation:    Chronic idiopathic constipation, gastrostomy tube dependent        HPI:   Lucas [name] is a 21 y.o. male referred by Dr. Kandyce Rud, MD  for consultation & management of constipation and gastrostomy tube.  Patient has history of autism, muscular dystrophy, s/p gastrostomy tube feeds.  Patient is accompanied by his nurse and by his mom today.  He has been doing well on gastrostomy tube feeds, weight has been maintained.  Patient is on MiraLAX daily that keeps his bowels regular.  Patient's mom reports that he developed rash with senna and docusate.  Patient's mom did not have any GI concerns today.  Most recent labs revealed no evidence of anemia  Follow-up visit 11/09/2022 Elige Radon is for follow-up of his nutrition needs.  Patient is accompanied by his mom as well as his caregiver.  Patient's mom is concerned about weight gain and therefore amount of tube feeds have been reduced to 4 times a day.  He is currently on pediatric nutrition supplements 4 times daily.  Patient's mom reports that he is not spitting up feeds or water anymore.  He developed tachycardia with Protonix, therefore has been discontinued.  He has regular bowel movements with combination of MiraLAX mixed with prune juice.  Patient's mom and his caregiver does not have any other concerns today.  He has an appointment to see nutritionist at [facility] on 12/08/2022.  NSAIDs: None  Antiplts/Anticoagulants/Anti thrombotics: None  GI Procedures: None  Past Medical History:  Diagnosis Date   Autism    Autism    Community acquired pneumonia of left lower lobe of lung 03/03/2022   Diarrhea 06/09/2015   Family  history of adverse reaction to anesthesia    MGGM- N/V   Muscular dystrophy (HCC)    Scoliosis    Seizures (HCC)    Last one 2013   Sepsis (HCC) 03/03/2022   Transient alteration of awareness    Viral gastroenteritis    Yeast infection of the skin 10/24/2020    Past Surgical History:  Procedure Laterality Date   CIRCUMCISION     at birth   LAPAROSCOPIC GASTROSTOMY N/A 05/24/2017   Procedure: LAPAROSCOPIC GASTROSTOMY TUBE PLACEMENT;  Surgeon: Kandice Hams, MD;  Location: [name];  Service: General;  Laterality: N/A;   SPINE SURGERY N/A    Phreesia 06/29/2020    Current Outpatient Medications:    acetaminophen (TYLENOL) 160 MG/5ML solution, Take 10 mLs (320 mg total) by mouth every 6 (six) hours as needed for mild pain, moderate pain, fever or headache., Disp: 473 mL, Rfl: 1   carbamazepine (TEGRETOL) 100 MG chewable tablet, TAKE 1 & 1/2 TABLETS BY MOUTH TWICE A DAY, Disp: 270 tablet, Rfl: 5   levalbuterol (XOPENEX) 1.25 MG/3ML nebulizer solution, Take 1.25 mg by nebulization every 4 (four) hours as needed., Disp: , Rfl:    Nutritional Supplements (PEDIASURE GROW & GAIN) LIQD, Drink 1 bottle spaced out 6 times a day (Patient taking differently: Drink 1 bottle spaced out 5 times a day), Disp: 42660 mL, Rfl: 12   nystatin (MYCOSTATIN/NYSTOP) powder, Apply 1 Application topically 3 (three) times daily., Disp: , Rfl:    polyethylene  glycol powder (GOODSENSE CLEARLAX) 17 GM/SCOOP powder, Take 17 g by mouth 2 (two) times daily. With prune juice, Disp: 3060 g, Rfl: 3   prednisoLONE (ORAPRED) 15 MG/5ML solution, PLACE 3 ML INTO FEEDING TUBE DAILY, Disp: 270 mL, Rfl: 3    Family History  Problem Relation Age of Onset   Cancer Paternal Grandfather        died at age 50   Dementia Paternal Grandmother        died at age 31   Other Mother        in special education, a carrier for Duchenne Muscular Dystrophy   Hyperlipidemia Mother    Learning disabilities Mother    Other Maternal Uncle         Duchenne Muscular Dystrophy, was in special educations   Other Maternal Aunt        was in special education   Other Maternal Grandmother        a carrier for Duchenne Muscular Dystrophy/Died due to septic shock at 21 years old    Heart failure Maternal Grandmother    Hypertension Maternal Grandmother    Alcohol abuse Father    Asthma Other    Hyperlipidemia Other    Hypertension Other    Vision loss Other      Social History   Tobacco Use   Smoking status: Never    Passive exposure: Yes   Smokeless tobacco: Never   Tobacco comments:    Mom smokes outside  Vaping Use   Vaping Use: Never used  Substance Use Topics   Alcohol use: No   Drug use: No    Allergies as of 11/09/2022 - Review Complete 11/09/2022  Allergen Reaction Noted   Penicillins Hives and Rash 04/30/2014   Docusate sodium  10/19/2021   Multivitamins  04/07/2021   Other Other (See Comments) 10/19/2021   Calcitonin Rash 12/23/2015   Esomeprazole magnesium Nausea And Vomiting 03/14/2019   Omeprazole Other (See Comments) 03/06/2019   Simethicone Palpitations and Rash 03/18/2021   Sunflower oil Itching, Rash, and Other (See Comments) 02/22/2021   Vitamin d analogs Other (See Comments) 02/2021    Review of Systems:    All systems reviewed and negative except where noted in HPI.   Physical Exam:  BP 112/76 (BP Location: Left Arm, Patient Position: Sitting, Cuff Size: Normal)   Pulse 94   Temp 99 F (37.2 C) (Axillary)   Ht 5' (1.524 m)   Wt 115 lb (52.2 kg) Comment: Per Mom  BMI 22.46 kg/m  No LMP for male patient.  General:   Alert,  Well-developed, well-nourished, pleasant NAD Head:  Normocephalic and atraumatic. Eyes:  Sclera clear, no icterus.   Conjunctiva pink. Ears:  Normal auditory acuity. Nose:  No deformity, discharge, or lesions. Mouth:  No deformity or lesions,oropharynx pink & moist. Neck:  Supple; no masses or thyromegaly. Lungs:  Respirations even and unlabored.  Clear  throughout to auscultation.   No wheezes, crackles, or rhonchi. No acute distress. Heart:  Regular rate and rhythm; no murmurs, clicks, rubs, or gallops. Abdomen:  Normal bowel sounds. Soft, gastrostomy T-piece in place, area appeared normal, and non-tender and non-distended without masses, hepatosplenomegaly or hernias noted.  No guarding or rebound tenderness.   Rectal: Not performed Msk: Muscle wasting of the extremities with contractures Pulses:  Normal pulses noted. Extremities:  No clubbing or edema.  No cyanosis. Neurologic:  Alert and oriented x0; Skin:  Intact without significant lesions or rashes. No jaundice.  Imaging Studies: None  Assessment and Plan:   BRAXSTON QUINTER is a 21 y.o. male with autism, muscular dystrophy s/p gastrostomy tube dependence, chronic idiopathic constipation.  Chronic idiopathic constipation Patient has regular bowel movements on MiraLAX.  No active GI issues today  Gastrostomy tube dependence Continue current tube feed regimen Advised to increase free water intake to 120 mL about an hour after each tube feed   Follow up annually or as needed   Cephas Darby, MD

## 2022-11-09 NOTE — Telephone Encounter (Signed)
  Name of who is calling: Raynelle Chary from [facility] contact [phone], Fax [phone]  Provider they see: [name]  Reason for call: She is asking if we can send over documentation for the CPAP and pulse oximeter. She is also going to fax over documentation for med nec.

## 2022-11-09 NOTE — Telephone Encounter (Signed)
Opened in error

## 2022-11-10 NOTE — Telephone Encounter (Signed)
Video visit sched with Dr. [place] Cellar for 12/1 in order to have updated note related to CPAP

## 2022-11-11 ENCOUNTER — Telehealth (INDEPENDENT_AMBULATORY_CARE_PROVIDER_SITE_OTHER): Payer: Medicaid Other | Admitting: Pediatrics

## 2022-11-11 ENCOUNTER — Encounter (INDEPENDENT_AMBULATORY_CARE_PROVIDER_SITE_OTHER): Payer: Self-pay | Admitting: Pediatrics

## 2022-11-11 ENCOUNTER — Telehealth (INDEPENDENT_AMBULATORY_CARE_PROVIDER_SITE_OTHER): Payer: Self-pay | Admitting: Pediatrics

## 2022-11-11 DIAGNOSIS — R0689 Other abnormalities of breathing: Secondary | ICD-10-CM

## 2022-11-11 DIAGNOSIS — Z7722 Contact with and (suspected) exposure to environmental tobacco smoke (acute) (chronic): Secondary | ICD-10-CM

## 2022-11-11 DIAGNOSIS — G4733 Obstructive sleep apnea (adult) (pediatric): Secondary | ICD-10-CM | POA: Diagnosis not present

## 2022-11-11 DIAGNOSIS — M4145 Neuromuscular scoliosis, thoracolumbar region: Secondary | ICD-10-CM

## 2022-11-11 DIAGNOSIS — J984 Other disorders of lung: Secondary | ICD-10-CM | POA: Diagnosis not present

## 2022-11-11 DIAGNOSIS — G7100 Muscular dystrophy, unspecified: Secondary | ICD-10-CM

## 2022-11-11 DIAGNOSIS — G7101 Duchenne or Becker muscular dystrophy: Secondary | ICD-10-CM

## 2022-11-11 NOTE — Progress Notes (Signed)
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 Overall, he has done very well from a respiratory standpoint, and tolerating his BiPap at night fairly well. Needs servicing of vest and cough assist - will reach out to [facility] about this. Also will work on getting a new pulse ox Plan: - Continue Bipap at night- IPAP 10 EPAP 6, itime 0.5-1.5 - Continue cough assist and vest BID, increase to 3-4 x when sick - Continue Xopenex  Obstructive sleep apnea: Lucas [name] has obstructive sleep apnea related to his muscular dystrophy. Doing well with bipap Plan: - Continue bipap as above - no significant hypoxemia seen on recent sleep study - but he may benefit from a pulse ox study to see if he needs supplemental oxygen if he is having observed desaturations at home  Healthcare Maintenance: Lucas [name] has received a flu vaccine this season.  Has received 3 COVID vaccines. Discussed my recommendation for a COVID booster today   Followup: scheduled for February     Lucas Dillin "Will" [place] Cellar, MD [facility] Pediatric Pulmonology [place] Office: [place] [phone]  I spent 15 minutes on the real-time audio and video with the patient. I spent an additional 25 minutes on pre- and post-visit activities.    The patient was physically located in [place] or a state in which I am permitted to provide care. The patient and/or parent/guardian understood that s/he may incur co-pays and cost sharing, and agreed to the telemedicine visit. The visit was reasonable and appropriate under the circumstances given the patient's presentation at the time.   The patient and/or parent/guardian has been advised  of the potential risks and limitations of this mode of treatment (including, but not limited to, the absence of in-person examination) and has agreed to be treated using telemedicine. The patient's/patient's family's questions regarding telemedicine have been answered.    If the visit was completed in an ambulatory setting, the patient and/or parent/guardian has also been advised to contact their provider's office for worsening conditions, and seek emergency medical treatment and/or call 911 if the patient deems either necessary.  Subjective:  Lucas [name] is a 21 y.o. male who is seen for followup of pulmonary manifestations of duchenne's muscular dystrophy.     Lucas [name] was last seen by myself in clinic via telehealth in May 2023. At that time, he was doing fairly well on home BiPAP after a sleep study showed hypoventilation.   Today his mother reports that he has been doing pretty well recently. No significant illnesses recently.   He overall is doing fairly well with using his BiPap machine. He uses it every night, and tolerates it well. He has some breakdown on the skin of his nose but that is not too significant. They only use one mask and have not tried a different mask. He doesn't have significant desaturations at night.   He is using his vest and cough assist regularly. Doing fairly well with those- but both machines are quite old and have not been serviced/ replaced recently.   He has been having lots of issues with is pulse ox - not reading correctly. They have been working to get a new one but have not been able to so far.   No significant respiratory illnesses since last  visit , no ED visits or hospitalizations since last visit , and no significant change in respiratory symptoms since last visit   Past Medical History:   Patient Active Problem List   Diagnosis Date Noted  . Sinus tachycardia 03/03/2022  . Ineffective airway clearance 10/29/2021  . Feeding by G-tube ([place]) 03/18/2019   . 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 Urinary retention 11/01/2018  . OSA (obstructive sleep apnea) 10/03/2017  . Failure to thrive (0-17) 05/24/2017  . 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 Right knee injury 06/30/2016  . Constipation   . Contractures involving both knees 07/21/2015  . Seizure ([place]) 06/08/2015  . Osteoporosis 10/30/2014  . Autism spectrum disorder with accompanying language impairment and intellectual disability, requiring very substantial support 06/17/2014  . Complex care coordination 04/30/2014  . Partial epilepsy with impairment of consciousness ([place]) 04/30/2014  . 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  acetaminophen (TYLENOL) 160 MG/5ML solution, Take 10 mLs (320 mg total) by mouth every 6 (six) hours as needed for mild pain, moderate pain, fever or headache., Disp: 473 mL, Rfl: 1 .  carbamazepine (TEGRETOL) 100 MG chewable tablet, TAKE 1 & 1/2 TABLETS BY MOUTH TWICE A DAY, Disp: 270 tablet, Rfl: 5 .  levalbuterol (XOPENEX) 1.25 MG/3ML nebulizer solution, Take 1.25 mg by nebulization every 4 (four) hours as needed., Disp: , Rfl:  .  Nutritional Supplements (PEDIASURE GROW & GAIN) LIQD, Drink 1 bottle spaced out 6 times a day (Patient taking differently: Drink 1 bottle spaced out 5 times a day), Disp: 42660 mL, Rfl: 12 .  nystatin (MYCOSTATIN/NYSTOP) powder, Apply 1 Application topically 3 (three) times daily., Disp: , Rfl:  .  polyethylene glycol powder (GOODSENSE CLEARLAX) 17 GM/SCOOP powder, Take 17 g by mouth 2 (two) times daily. With prune juice, Disp: 3060 g, Rfl: 3 .  prednisoLONE (ORAPRED) 15 MG/5ML solution, PLACE 3 ML INTO FEEDING TUBE DAILY, Disp: 270 mL, Rfl: 3  Social History:   Social History   Social History Narrative   He lives with mother.   He graduated high school in 22.    He is not currently in a day program    He  currently is in PT (at home) mom upset they are going to lose this service when he turns 21.      Lives with mother and [place] in [place] [phone]. Mother smokes.   Objective:  Vitals Signs: There were no vitals taken for this visit. Wt Readings from Last 3 Encounters:  11/09/22 115 lb (52.2 kg)  08/25/22 110 lb 6.4 oz (50.1 kg)  04/18/22 105 lb (47.6 kg)   General: awake, no apparent distress by video observation Skin: what is visible appears to be clear and smooth without rash or excessive dryness. No visible breakdown over nose HEENT: appears to be normocephalic. No deformity of ear or nose seen. Nares appear clear without rhinorrhea. Neck: symmetric without obvious masses Chest: symmetrical, no increased work of breathing or retractions Lungs: no audible stridor.   Medical Decision Making:   Recent Blood Gases/Bicarbonates: pH, Ven  Date Value Ref Range Status  06/09/2015 7.397 (H) 7.250 - 7.300 Final   pCO2, Ven  Date Value Ref Range Status  06/09/2015 36.7 (L) 45.0 - 50.0 mmHg Final   Bicarb:   CO2  Date Value Ref Range Status  03/04/2022 31 22 - 32 mmol/L Final   Co2  Date  Value Ref Range Status  03/12/2015 27 mmol/L Final    Comment:    22-32 NOTE: New Reference Range  02/17/15    Per Larrie Kass note:  EEG showed right central diphasic sharply contoured slow-wave activity.   MRI of the brain failed to show a structural abnormality.   Autism was diagnosed at age 13 Duchenne muscular dystrophy was diagnosed at age 70. Polysomnogram October 2018 at [facility] - obstructive sleep apnea EKG 05/11/18 - normal sinus rhythm with normal PR, QRS and corrected QT interval Echocardiogram 05/11/18 - normal biventricular size and function  Polysomnography: jan 2023

## 2022-11-14 ENCOUNTER — Telehealth: Payer: Self-pay

## 2022-11-14 NOTE — Telephone Encounter (Signed)
During video visit with Dr. [place] Cellar on 12/1 mom asked if he could receive a new cough assist and Vest. RN advised she did not know how often they are replaced but could contact the rep that covers our area if she wishes. Mom agreed. Email to [employer] at [facility] following is the response.  They got both the vest and cough assist device in 2019. The Cough assist can be replaced because it is still under warranty. The Vest device can be replaced every 5 years, so they would not be eligible for a new device. We can however, order them a new Vest garment if they need one. I am going to have my team reach out to them  My chart message with above information sent to mom.

## 2022-11-16 ENCOUNTER — Telehealth: Payer: Self-pay

## 2022-11-16 NOTE — Progress Notes (Incomplete)
Patient: Lucas [name] MRN: [id number] Sex: male DOB: 11-23-2001  Provider: Carylon Perches, MD Location of Care: Pediatric Specialist- Pediatric Complex Care Note type: Routine return visit  History of Present Illness: Referral Source: Turner Daniels, MD  History from: patient and prior records Chief Complaint: Pediatric Complex Care   [name] is a 21 y.o. male with history of Duchenne Muscular Dystrophy, autism spectrum disorder, level 3, restrictive lung disease, compensated congestive heart failure, severe dysphagia requiring tube feeding, and obstructive sleep apnea who I am seeing in follow-up for complex care management. Patient was last seen 08/25/22 where I continued all other medications.  Since that appointment, patient has ***.   Patient presents today with {CHL AMB PARENT/GUARDIAN:[id number]} They report their largest concern is ***  Symptom management:     Care coordination (other providers): There have been many messages about managing his Reflux, had increased heart rate with pantoprazole, GI recommended switched to pepcid, however mom reports he previously had a reaction to this. GI noted that in order to try other medications, he would need an endoscopy, mom states "she will not do an EGD and he just will not be on any medication."  Dr. Barb Merino notes she received paperwork for formula with [employer] but cannot complete it without a visit. In addition, Jake, patients nurse also reported to them that he is gaining excess weight. Wonders about decreasing feeds, they note they cannot give advice without a visit.   He was scheduled and missed appointments with Dr. Barb Merino on 10/9, 10/31, 11/6, and 11/7.   He was seen by Dr. Loney Hering on 10/17/22 where they recommended decreasing to 5 feeds a day until he is able to be seen by GI.   He then saw Dr. Barb Merino 11/09/22 where they continued 5 feeds a day and increased his water intake.   He also saw Dr. [place] Cellar for pulmonology  on 11/11/22 where they discussed his need for a new pulse ox, continued Bpap at night and cough assist and vest BID. He also continued Xopenex.    Care management needs:  We wrote a letter requesting more nursing hours and sent to [employer] who agreed to upload it to insurance.   Equipment needs:  At last visit, placed an order for new pulse ox an feeding supplies. Also advised mom to call [employer] to fix or replace his chest vest. I also re ordered hand splints.   Decision making/Advanced care planning:  Diagnostics/Patient history:   Review of Systems: {cn system review:210120003}  Past Medical History Past Medical History:  Diagnosis Date   Autism    Autism    Community acquired pneumonia of left lower lobe of lung 03/03/2022   Diarrhea 06/09/2015   Family history of adverse reaction to anesthesia    MGGM- N/V   Muscular dystrophy ([place])    Scoliosis    Seizures ([place])    Last one 2013   Sepsis ([place]) 03/03/2022   Transient alteration of awareness    Viral gastroenteritis    Yeast infection of the skin 10/24/2020    Surgical History Past Surgical History:  Procedure Laterality Date   CIRCUMCISION     at birth   LAPAROSCOPIC GASTROSTOMY N/A 05/24/2017   Procedure: [place];  Surgeon: Stanford Scotland, MD;  Location: [facility];  Service: General;  Laterality: N/A;   SPINE SURGERY N/A    [employer]ia 06/29/2020    Family History family history includes Alcohol abuse in his father; Asthma in  an other family member; Cancer in his paternal grandfather; Dementia in his paternal grandmother; Heart failure in his maternal grandmother; Hyperlipidemia in his mother and another family member; Hypertension in his maternal grandmother and another family member; Learning disabilities in his mother; Other in his maternal aunt, maternal grandmother, maternal uncle, and mother; Vision loss in an other family member.   Social History Social History   Social History  Narrative   He lives with mother.   He graduated high school in 22.    He is not currently in a day program    He currently is in PT (at home) mom upset they are going to lose this service when he turns 21.     Allergies Allergies  Allergen Reactions   Penicillins Hives and Rash    Has patient had a PCN reaction causing immediate rash, facial/tongue/throat swelling, SOB or lightheadedness with hypotension: Yes Has patient had a PCN reaction causing severe rash involving mucus membranes or skin necrosis: Yes Has patient had a PCN reaction that required hospitalization: No Has patient had a PCN reaction occurring within the last 10 years: No If all of the above answers are "NO", then may proceed with Cephalosporin use.    Docusate Sodium     Rash and cant sleep   Multivitamins     Red in face, itchy, bumps   Other Other (See Comments)    Senkot -- gets rash and cant sleep   Calcitonin Rash   Esomeprazole Magnesium Nausea And Vomiting   Omeprazole Other (See Comments)    Constipation and possible rash after drug was stopped   Simethicone Palpitations and Rash   Sunflower Oil Itching, Rash and Other (See Comments)    GI upset - Avoid products containing this additive   Vitamin D Analogs Other (See Comments)    Rash on face if it contains sunflower seed oil Excessive urine output and thirsty, increase in gas    Medications Current Outpatient Medications on File Prior to Visit  Medication Sig Dispense Refill   acetaminophen (TYLENOL) 160 MG/5ML solution Take 10 mLs (320 mg total) by mouth every 6 (six) hours as needed for mild pain, moderate pain, fever or headache. 473 mL 1   carbamazepine (TEGRETOL) 100 MG chewable tablet TAKE 1 & 1/2 TABLETS BY MOUTH TWICE A DAY 270 tablet 5   levalbuterol (XOPENEX) 1.25 MG/3ML nebulizer solution Take 1.25 mg by nebulization every 4 (four) hours as needed.     Nutritional Supplements (PEDIASURE GROW & GAIN) LIQD Drink 1 bottle spaced out 6 times  a day (Patient taking differently: Drink 1 bottle spaced out 5 times a day) 42660 mL 12   nystatin (MYCOSTATIN/NYSTOP) powder Apply 1 Application topically 3 (three) times daily.     polyethylene glycol powder (GOODSENSE CLEARLAX) 17 GM/SCOOP powder Take 17 g by mouth 2 (two) times daily. With prune juice 3060 g 3   prednisoLONE (ORAPRED) 15 MG/5ML solution PLACE 3 ML INTO FEEDING TUBE DAILY 270 mL 3   No current facility-administered medications on file prior to visit.   The medication list was reviewed and reconciled. All changes or newly prescribed medications were explained.  A complete medication list was provided to the patient/caregiver.  Physical Exam There were no vitals taken for this visit. Weight for age: Facility age limit for growth %iles is 20 years.  Length for age: Facility age limit for growth %iles is 20 years. BMI: There is no height or weight on file to calculate BMI.  No results found.   Diagnosis: No diagnosis found.   Assessment and Plan LEVANDER KATZENSTEIN is a 21 y.o. male with history of Duchenne Muscular Dystrophy, autism spectrum disorder, level 3, restrictive lung disease, compensated congestive heart failure, severe dysphagia requiring tube feeding, and obstructive sleep apnea who presents for follow-up in the pediatric complex care clinic.  Patient seen by case manager, dietician, integrated behavioral health today as well, please see accompanying notes.  I discussed case with all involved parties for coordination of care and recommend patient follow their instructions as below.   Symptom management:     Care coordination:  Care management needs:   Equipment needs:   Decision making/Advanced care planning:  The CARE PLAN for reviewed and revised to represent the changes above.  This is available in Epic under snapshot, and a physical binder provided to the patient, that can be used for anyone providing care for the patient.   I spent *** minutes on day  of service on this patient including review of chart, discussion with patient and family, discussion of screening results, coordination with other providers and management of orders and paperwork.     No follow-ups on file.  I, Scharlene Gloss, scribed for and in the presence of Carylon Perches, MD at today's visit on 11/24/2022.   Carylon Perches MD MPH Neurology,  Neurodevelopment and Neuropalliative care [facility] Child Neurology  [address] [place], [place], [place] [address] Phone: [phone] Fax: [phone]

## 2022-11-16 NOTE — Telephone Encounter (Signed)
Patient nurse Butch Penny called and left a voicemail on 11/15/2022 stating she had questions about the orders for water after each feed. Called and left a message for call back to find out the question she had

## 2022-11-16 NOTE — Telephone Encounter (Signed)
They can try 60 mL of water with the last feed only instead of 120 mL  RV

## 2022-11-16 NOTE — Telephone Encounter (Signed)
Patient nurse Lucas [name] is calling because he states they have started doing 60ml of water before each feed and 61ml after each feed. He gets 4 feeds a day and is tolerating all the water intake but the last 174ml of water. He states he is throwing up the last feed. They want to know what you recommend

## 2022-11-17 NOTE — Telephone Encounter (Signed)
Lucas [name] called back and states the need orders faxed to them for the change in freed water after each feed. He asked [place] to fax the order to [phone]. Faxed order to them

## 2022-11-17 NOTE — Telephone Encounter (Signed)
[facility] and he verbalized understanding of instructions

## 2022-11-18 ENCOUNTER — Telehealth (INDEPENDENT_AMBULATORY_CARE_PROVIDER_SITE_OTHER): Payer: Self-pay

## 2022-11-18 NOTE — Telephone Encounter (Signed)
Order form for cough assist completed and faxed to Dr. Westfield Cellar to sign

## 2022-11-19 ENCOUNTER — Emergency Department: Payer: Medicaid Other

## 2022-11-19 ENCOUNTER — Inpatient Hospital Stay
Admission: EM | Admit: 2022-11-19 | Discharge: 2022-11-22 | DRG: 871 | Disposition: A | Discharge status: Home / Self Care | Payer: Medicaid Other | Attending: Internal Medicine | Admitting: Internal Medicine

## 2022-11-19 ENCOUNTER — Other Ambulatory Visit: Payer: Self-pay

## 2022-11-19 DIAGNOSIS — Z931 Gastrostomy status: Secondary | ICD-10-CM

## 2022-11-19 DIAGNOSIS — Z888 Allergy status to other drugs, medicaments and biological substances status: Secondary | ICD-10-CM

## 2022-11-19 DIAGNOSIS — E860 Dehydration: Secondary | ICD-10-CM | POA: Diagnosis present

## 2022-11-19 DIAGNOSIS — R197 Diarrhea, unspecified: Secondary | ICD-10-CM | POA: Diagnosis present

## 2022-11-19 DIAGNOSIS — Z7401 Bed confinement status: Secondary | ICD-10-CM

## 2022-11-19 DIAGNOSIS — R569 Unspecified convulsions: Secondary | ICD-10-CM

## 2022-11-19 DIAGNOSIS — E162 Hypoglycemia, unspecified: Secondary | ICD-10-CM | POA: Diagnosis not present

## 2022-11-19 DIAGNOSIS — F809 Developmental disorder of speech and language, unspecified: Secondary | ICD-10-CM | POA: Diagnosis present

## 2022-11-19 DIAGNOSIS — J984 Other disorders of lung: Secondary | ICD-10-CM | POA: Diagnosis present

## 2022-11-19 DIAGNOSIS — F79 Unspecified intellectual disabilities: Secondary | ICD-10-CM | POA: Diagnosis present

## 2022-11-19 DIAGNOSIS — G40109 Localization-related (focal) (partial) symptomatic epilepsy and epileptic syndromes with simple partial seizures, not intractable, without status epilepticus: Secondary | ICD-10-CM | POA: Diagnosis present

## 2022-11-19 DIAGNOSIS — Z91018 Allergy to other foods: Secondary | ICD-10-CM

## 2022-11-19 DIAGNOSIS — G4733 Obstructive sleep apnea (adult) (pediatric): Secondary | ICD-10-CM | POA: Diagnosis present

## 2022-11-19 DIAGNOSIS — Z1152 Encounter for screening for COVID-19: Secondary | ICD-10-CM

## 2022-11-19 DIAGNOSIS — F840 Autistic disorder: Secondary | ICD-10-CM | POA: Diagnosis present

## 2022-11-19 DIAGNOSIS — Z880 Allergy status to penicillin: Secondary | ICD-10-CM

## 2022-11-19 DIAGNOSIS — G7101 Duchenne or Becker muscular dystrophy: Secondary | ICD-10-CM | POA: Diagnosis present

## 2022-11-19 DIAGNOSIS — Z79899 Other long term (current) drug therapy: Secondary | ICD-10-CM

## 2022-11-19 DIAGNOSIS — E876 Hypokalemia: Secondary | ICD-10-CM | POA: Diagnosis present

## 2022-11-19 DIAGNOSIS — J189 Pneumonia, unspecified organism: Principal | ICD-10-CM

## 2022-11-19 DIAGNOSIS — A419 Sepsis, unspecified organism: Principal | ICD-10-CM | POA: Diagnosis present

## 2022-11-19 DIAGNOSIS — J181 Lobar pneumonia, unspecified organism: Secondary | ICD-10-CM | POA: Diagnosis present

## 2022-11-19 DIAGNOSIS — M810 Age-related osteoporosis without current pathological fracture: Secondary | ICD-10-CM | POA: Diagnosis present

## 2022-11-19 DIAGNOSIS — R000 Tachycardia, unspecified: Secondary | ICD-10-CM

## 2022-11-19 LAB — URINALYSIS, COMPLETE (UACMP) WITH MICROSCOPIC
Bacteria, UA: NONE SEEN
Bilirubin Urine: NEGATIVE
Glucose, UA: NEGATIVE mg/dL
Hgb urine dipstick: NEGATIVE
Ketones, ur: 20 mg/dL — AB
Leukocytes,Ua: NEGATIVE
Nitrite: NEGATIVE
Protein, ur: NEGATIVE mg/dL
Specific Gravity, Urine: 1.027 (ref 1.005–1.030)
Squamous Epithelial / HPF: NONE SEEN (ref 0–5)
pH: 7 (ref 5.0–8.0)

## 2022-11-19 LAB — BASIC METABOLIC PANEL
Anion gap: 8 (ref 5–15)
BUN: 7 mg/dL (ref 6–20)
CO2: 26 mmol/L (ref 22–32)
Calcium: 9.4 mg/dL (ref 8.9–10.3)
Chloride: 105 mmol/L (ref 98–111)
Creatinine, Ser: <0.3 mg/dL — ABNORMAL LOW (ref 0.61–1.24)
Glucose, Bld: 128 mg/dL — ABNORMAL HIGH (ref 70–99)
Potassium: 4.4 mmol/L (ref 3.5–5.1)
Sodium: 139 mmol/L (ref 135–145)

## 2022-11-19 LAB — CBC WITH DIFFERENTIAL/PLATELET
Abs Immature Granulocytes: 0.13 10*3/uL — ABNORMAL HIGH (ref 0.00–0.07)
Basophils Absolute: 0.1 10*3/uL (ref 0.0–0.1)
Basophils Relative: 1 %
Eosinophils Absolute: 0 10*3/uL (ref 0.0–0.5)
Eosinophils Relative: 0 %
HCT: 45 % (ref 39.0–52.0)
Hemoglobin: 15.1 g/dL (ref 13.0–17.0)
Immature Granulocytes: 1 %
Lymphocytes Relative: 5 %
Lymphs Abs: 0.5 10*3/uL — ABNORMAL LOW (ref 0.7–4.0)
MCH: 29.8 pg (ref 26.0–34.0)
MCHC: 33.6 g/dL (ref 30.0–36.0)
MCV: 88.9 fL (ref 80.0–100.0)
Monocytes Absolute: 0.7 10*3/uL (ref 0.1–1.0)
Monocytes Relative: 7 %
Neutro Abs: 8.1 10*3/uL — ABNORMAL HIGH (ref 1.7–7.7)
Neutrophils Relative %: 86 %
Platelets: 193 10*3/uL (ref 150–400)
RBC: 5.06 MIL/uL (ref 4.22–5.81)
RDW: 13.3 % (ref 11.5–15.5)
WBC: 9.5 10*3/uL (ref 4.0–10.5)
nRBC: 0 % (ref 0.0–0.2)

## 2022-11-19 LAB — GASTROINTESTINAL PANEL BY PCR, STOOL (REPLACES STOOL CULTURE)

## 2022-11-19 LAB — TROPONIN I (HIGH SENSITIVITY): Troponin I (High Sensitivity): 13 ng/L (ref <18)

## 2022-11-19 LAB — C DIFFICILE QUICK SCREEN W PCR REFLEX
C Diff antigen: NEGATIVE
C Diff interpretation: NOT DETECTED
C Diff toxin: NEGATIVE

## 2022-11-19 LAB — RESP PANEL BY RT-PCR (RSV, FLU A&B, COVID)  RVPGX2
Influenza A by PCR: NEGATIVE
Influenza B by PCR: NEGATIVE
Resp Syncytial Virus by PCR: NEGATIVE
SARS Coronavirus 2 by RT PCR: NEGATIVE

## 2022-11-19 LAB — LACTIC ACID, PLASMA: Lactic Acid, Venous: 0.9 mmol/L (ref 0.5–1.9)

## 2022-11-19 MED ORDER — SODIUM CHLORIDE 0.9 % IV SOLN: 500.0000 mg | Freq: Once | INTRAVENOUS | Status: DC

## 2022-11-19 MED ORDER — SODIUM CHLORIDE 0.9 % IV SOLN
500.0000 mg | Freq: Once | INTRAVENOUS | Status: AC
  Administered 2022-11-19: 500 mg via INTRAVENOUS
  Filled 2022-11-19: qty 5

## 2022-11-19 MED ORDER — ACETAMINOPHEN 650 MG RE SUPP: 650.0000 mg | Freq: Four times a day (QID) | RECTAL | Status: DC | PRN

## 2022-11-19 MED ORDER — ACETAMINOPHEN 650 MG RE SUPP
650.0000 mg | RECTAL | Status: AC
  Administered 2022-11-19: 650 mg via RECTAL
  Filled 2022-11-19: qty 1

## 2022-11-19 MED ORDER — SODIUM CHLORIDE 0.9 % IV BOLUS
500.0000 mL | Freq: Once | INTRAVENOUS | Status: AC
  Administered 2022-11-19: 500 mL via INTRAVENOUS

## 2022-11-19 MED ORDER — SODIUM CHLORIDE 0.9 % IV SOLN: INTRAVENOUS | Status: DC

## 2022-11-19 MED ORDER — ONDANSETRON HCL 4 MG PO TABS: 4.0000 mg | ORAL_TABLET | Freq: Four times a day (QID) | ORAL | Status: DC | PRN

## 2022-11-19 MED ORDER — CARBAMAZEPINE 100 MG PO CHEW: 100.0000 mg | CHEWABLE_TABLET | Freq: Two times a day (BID) | ORAL | Status: DC

## 2022-11-19 MED ORDER — SODIUM CHLORIDE 0.9 % IV SOLN: INTRAVENOUS | Status: AC

## 2022-11-19 MED ORDER — IOHEXOL 350 MG/ML SOLN
75.0000 mL | Freq: Once | INTRAVENOUS | Status: AC | PRN
  Administered 2022-11-19: 75 mL via INTRAVENOUS

## 2022-11-19 MED ORDER — ENOXAPARIN SODIUM 40 MG/0.4ML IJ SOSY
40.0000 mg | PREFILLED_SYRINGE | INTRAMUSCULAR | Status: DC
  Administered 2022-11-20 – 2022-11-21 (×3): 40 mg via SUBCUTANEOUS
  Filled 2022-11-19 (×3): qty 0.4

## 2022-11-19 MED ORDER — PEDIASURE PO LIQD
Freq: Every day | ORAL | Status: DC
  Filled 2022-11-19: qty 237

## 2022-11-19 MED ORDER — CARBAMAZEPINE 100 MG PO CHEW
150.0000 mg | CHEWABLE_TABLET | Freq: Two times a day (BID) | ORAL | Status: DC
  Administered 2022-11-20 – 2022-11-22 (×6): 150 mg
  Filled 2022-11-19 (×7): qty 1.5

## 2022-11-19 MED ORDER — ACETAMINOPHEN 160 MG/5ML PO SOLN: 320.0000 mg | Freq: Four times a day (QID) | ORAL | Status: DC | PRN

## 2022-11-19 MED ORDER — SODIUM CHLORIDE 0.9 % IV SOLN
2.0000 g | INTRAVENOUS | Status: DC
  Administered 2022-11-19: 2 g via INTRAVENOUS
  Filled 2022-11-19 (×2): qty 20

## 2022-11-19 MED ORDER — SODIUM CHLORIDE 0.9 % IV SOLN
500.0000 mg | INTRAVENOUS | Status: DC
  Filled 2022-11-19: qty 5

## 2022-11-19 MED ORDER — ONDANSETRON HCL 4 MG/2ML IJ SOLN: 4.0000 mg | Freq: Four times a day (QID) | INTRAMUSCULAR | Status: DC | PRN

## 2022-11-19 MED ORDER — ACETAMINOPHEN 325 MG PO TABS: 650.0000 mg | ORAL_TABLET | Freq: Four times a day (QID) | ORAL | Status: DC | PRN

## 2022-11-19 NOTE — ED Provider Notes (Signed)
Lucas [name]    Event Date/Time   First MD Initiated Contact with Patient 11/19/22 1016     (approximate)   History   Cough   HPI  Lucas [name] is a 21 y.o. male with a past medical history of Duchenne muscular dystrophy, restrictive lung disease, BiPAP use at night who presents today for evaluation of cough and fever.  Mom also notices that he had a slight cough, but appears to have trouble getting the mucus out.  Mom also reports that she tried to give him an enema last week and used shampoo with that.  Since that time, she feels that he has had "rumbling" in his stomach and she is concerned that he potentially has a GI bug.  No vomiting.  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) 06/08/2015   Osteoporosis 10/30/2014   Autism spectrum disorder with accompanying language impairment and intellectual disability, requiring very substantial support 06/17/2014   Complex care coordination 04/30/2014   Partial epilepsy with impairment of consciousness ([place]) 04/30/2014   Duchenne muscular dystrophy ([place]) 04/30/2014          Physical Exam   Triage Vital Signs: ED Triage Vitals  Enc Vitals Group     BP 11/19/22 0934 124/88     Pulse Rate 11/19/22 0934 (!) 125     Resp 11/19/22 0934 20     Temp 11/19/22 0934 99.4 F (37.4 C)     Temp Source 11/19/22 0934 Axillary     SpO2 11/19/22 0934  98 %     Weight 11/19/22 0938 115 lb (52.2 kg)     Height 11/19/22 0938 5' (1.524 m)     Head Circumference --      Peak Flow --      Pain Score --      Pain Loc --      Pain Edu? --      Excl. in [place]? --     Most recent vital signs: Vitals:   11/20/22 0042 11/20/22 0502  BP:  103/68  Pulse:  (!) 102  Resp: 15 20  Temp:  98.9 F (37.2 C)  SpO2: 97% 95%    Physical Exam Vitals and nursing [name] reviewed.  Constitutional:      General: Awake and alert. Does not respond to verbal stimuli    Appearance: Normal appearance. The patient is normal weight. Bedbound, minimally verbal HENT:     Head: Normocephalic and atraumatic.     Mouth: Mucous membranes are moist.  Eyes:     General: PERRL. Normal EOMs        Right eye: No discharge.        Left eye: No discharge.     Conjunctiva/sclera: Conjunctivae normal.  Cardiovascular:     Rate and Rhythm: Tachycardic rate and regular rhythm.     Pulses: Normal pulses.     Heart sounds: Normal heart sounds Pulmonary:  Effort: Pulmonary effort is normal. No respiratory distress.     Breath sounds: Rhonchi in left lung base Abdominal:     Abdomen is soft. There is no wincing with palpation of his abdomen. No rebound or guarding. No distention. Has g-tube.  No surrounding erythema Musculoskeletal:        General: No swelling. Contracted extremities    Cervical back: Normal range of motion and neck supple.  Skin:    General: Skin is warm and dry.     Capillary Refill: Capillary refill takes less than 2 seconds.     Findings: No rash.  Neurological:     Mental Status: The patient is awake and alert.      ED Results / Procedures / Treatments   Labs (all labs ordered are listed, but only abnormal results are displayed) Labs Reviewed  BASIC METABOLIC PANEL - Abnormal; Notable for the following components:      Result Value   Glucose, Bld 128 (*)    Creatinine, Ser <0.30 (*)    All other components within normal limits  CBC  WITH DIFFERENTIAL/PLATELET - Abnormal; Notable for the following components:   Neutro Abs 8.1 (*)    Lymphs Abs 0.5 (*)    Abs Immature Granulocytes 0.13 (*)    All other components within normal limits  URINALYSIS, COMPLETE (UACMP) WITH MICROSCOPIC - Abnormal; Notable for the following components:   Color, Urine STRAW (*)    APPearance CLEAR (*)    Ketones, ur 20 (*)    All other components within normal limits  CBC - Abnormal; Notable for the following components:   HCT 38.8 (*)    All other components within normal limits  BASIC METABOLIC PANEL - Abnormal; Notable for the following components:   Potassium 2.6 (*)    Glucose, Bld 64 (*)    BUN 5 (*)    Creatinine, Ser <0.30 (*)    Calcium 8.7 (*)    All other components within normal limits  RESP PANEL BY RT-PCR (RSV, FLU A&B, COVID)  RVPGX2  GASTROINTESTINAL PANEL BY PCR, STOOL (REPLACES STOOL CULTURE)  C DIFFICILE QUICK SCREEN W PCR REFLEX    CULTURE, BLOOD (ROUTINE X 2)  CULTURE, BLOOD (ROUTINE X 2)  RESPIRATORY PANEL BY PCR  LACTIC ACID, PLASMA  LEGIONELLA PNEUMOPHILA SEROGP 1 UR AG  TROPONIN I (HIGH SENSITIVITY)     EKG     RADIOLOGY I independently reviewed and interpreted imaging and agree with radiologists findings.     PROCEDURES:  Critical Care performed:   Procedures   MEDICATIONS ORDERED IN ED: Medications  cefTRIAXone (ROCEPHIN) 2 g in sodium chloride 0.9 % 100 mL IVPB (0 g Intravenous Stopped 11/19/22 1635)  acetaminophen (TYLENOL) 160 MG/5ML solution 320 mg (has no administration in time range)  PediaSure liquid ( Per Tube Not Given 11/20/22 0623)  enoxaparin (LOVENOX) injection 40 mg (40 mg Subcutaneous Given 11/20/22 0016)  ondansetron (ZOFRAN) tablet 4 mg (has no administration in time range)    Or  ondansetron (ZOFRAN) injection 4 mg (has no administration in time range)  0.9 %  sodium chloride infusion (0 mLs Intravenous Stopped 11/20/22 0810)  carbamazepine (TEGRETOL) chewable tablet  150 mg (150 mg Per Tube Given 11/20/22 0052)  feeding supplement (PEDIASURE 1.5) liquid 170 mL (170 mLs Per Tube Not Given 11/20/22 0623)  free water 30 mL (30 mLs Per Tube Given 11/20/22 0811)  azithromycin (ZITHROMAX) 500 mg in sodium chloride 0.9 % 250 mL IVPB (has no administration  in time range)  citric acid-potassium citrate (POLYCITRA) 10 mEq/5 ml solution 40 mEq (has no administration in time range)  potassium chloride 10 mEq in 100 mL IVPB (10 mEq Intravenous New Bag/Given 11/20/22 0812)  dextrose 5 %-0.9 % sodium chloride infusion ( Intravenous New Bag/Given 11/20/22 0810)  iohexol (OMNIPAQUE) 350 MG/ML injection 75 mL (75 mLs Intravenous Contrast Given 11/19/22 1334)  sodium chloride 0.9 % bolus 500 mL (0 mLs Intravenous Stopped 11/19/22 1527)  sodium chloride 0.9 % bolus 500 mL (0 mLs Intravenous Stopped 11/19/22 1644)  azithromycin (ZITHROMAX) 500 mg in sodium chloride 0.9 % 250 mL IVPB (0 mg Intravenous Stopped 11/19/22 1851)  acetaminophen (TYLENOL) suppository 650 mg (650 mg Rectal Given 11/19/22 2241)  dextrose 50 % solution 12.5 g (12.5 g Intravenous Given 11/20/22 0657)     IMPRESSION / MDM / ASSESSMENT AND PLAN / ED COURSE  I reviewed the triage vital signs and the nursing notes.   Differential diagnosis includes, but is not limited to, COVID, influenza, pneumonia, URI, sepsis/SIRs, gastroenteritis,a appendicitis, diverticulitis, pulmonary embolism.  Patient is tachycardic on arrival.  Per EMS, he was hypoxic to 92% on room air, though currently is at 98% on room air.  He was placed on the cardiac monitor and dropped down to the low 90s on room air.  He has rhonchi in his left lung base.  He remains tachycardic on my exam.  There is no lower extremity edema to suggest DVT.  Mom denies any history of PE or DVT.  However, he is bedbound, and persistently tachycardic with a new cough.  COVID and flu are negative.  Mom agreed to further evaluation with CT chest for evaluation of  pulmonary embolism, as well as CT abdomen given that she feels that his abdomen is " making gurgling noises."  CT abdomen pelvis demonstrates fluid-filled colon consistent with diarrheal illness.  Patient has not had any recent antibiotics to suggest C. difficile.  CTA chest is negative for pulmonary embolism but does demonstrate a consolidation in the left lung base.  Given patient's subjective fevers at home as well as cough and persistent tachycardia, recommend admission to the hospital for IV antibiotics for community-acquired pneumonia.  Mom is in agreement with this plan.  He was started on ceftriaxone and azithromycin.  He has a documented penicillin allergy, though per chart review he has tolerated ceftriaxone easily, most recently in March of this year at which time, he was admitted for sepsis due to pneumonia of his left lower lung and sepsis.  Discussed with the hospitalist who has agreed to formally consult.  Patient was also discussed with Dr. Ellender Hose who agrees with assessment and plan for admission.  Patient's presentation is most consistent with acute presentation with potential threat to life or bodily function.   Clinical Course as of 11/20/22 0820  Sat Nov 19, 2022  1027 On reevaluation, mom reports that his abdomen is distended [JP]    Clinical Course User Index [JP] Myndi Wamble, Clarnce Flock, PA-C     FINAL CLINICAL IMPRESSION(S) / ED DIAGNOSES   Final diagnoses:  Community acquired pneumonia of left lower lobe of lung  Diarrhea, unspecified type  Tachycardia     Rx / DC Orders   ED Discharge Orders     None        [name]:  This document was prepared using Dragon voice recognition software and may include unintentional dictation errors.   Emeline Gins 11/20/22 Azzie Almas    Duffy Bruce, MD  11/28/22 1804  

## 2022-11-19 NOTE — ED Notes (Signed)
Pt had small BM. Cleaned and placed in new brief after admin rectal tylenol.

## 2022-11-19 NOTE — Assessment & Plan Note (Signed)
Requires assistance with all ADL's

## 2022-11-19 NOTE — Assessment & Plan Note (Signed)
Continue supportive care

## 2022-11-19 NOTE — ED Triage Notes (Signed)
Pt arrives via EMS from home- pt has been sick since Thursday and started with a cough today- pt had a temp of 99, hr 130, 92% RA (pt does wear bipap at night)- pt also having foul smelling stool- pt last tylenol was at 0700

## 2022-11-19 NOTE — ED Notes (Signed)
Pt just back from CT.

## 2022-11-19 NOTE — ED Provider Triage Note (Signed)
Emergency Medicine Provider Triage Evaluation Note  Bevely Palmer , a 21 y.o. male  was evaluated in triage.  Pt complains of cough and fever for 2 days. H/o of duchennes muscular dystrophy and autism. Not normally on oxygen. Mom reports Tmax 99.11F. Got tylenol at [place] thinks one of his nurses is sick. Bad smelling stool, but no recent abx. Mom reports that she gave an enema with shampoo before that foul smelling stool.  Review of Systems  Positive: Cough, fever Negative: pain  Physical Exam  There were no vitals taken for this visit. Gen:   Awake, no distress   Resp:  Normal effort, though on 2L [place]  MSK:   Moves extremities without difficulty  Other:    Medical Decision Making  Medically screening exam initiated at 9:30 AM.  Appropriate orders placed.  DEMARQUIS OSLEY was informed that the remainder of the evaluation will be completed by another provider, this initial triage assessment does not replace that evaluation, and the importance of remaining in the ED until their evaluation is complete.     Marquette Old, PA-C 11/19/22 8295

## 2022-11-19 NOTE — Assessment & Plan Note (Signed)
Continue carbamazepine

## 2022-11-19 NOTE — Assessment & Plan Note (Signed)
Secondary to GI losses from diarrhea illness Hydrate patient Supportive care

## 2022-11-19 NOTE — Assessment & Plan Note (Signed)
Patient presents to the ER for evaluation of feeling unwell and is noted to be tachycardic.  He also has a nonproductive cough and a low-grade fever as well as diarrhea.  See above, given I think that this is may be more likely abdominal infection, will change antibiotics to cover both pneumonia and GI tract with cefepime and Flagyl.  Cough could possibly be to diaphragmatic irritation from colonic infection rather than true pneumonia.  Patient unable to produce sputum culture.

## 2022-11-19 NOTE — Assessment & Plan Note (Signed)
Continue BiPAP at bedtime

## 2022-11-19 NOTE — ED Notes (Signed)
This RN to bedside. Pt does not have IV fluids running as ordered in chart. This RN initiated IV fluids as ordered in chart.

## 2022-11-19 NOTE — ED Notes (Signed)
This RN to bedside to assess rectal temper at request of NP and clean. Pt had a bowel movement.

## 2022-11-19 NOTE — Assessment & Plan Note (Signed)
Unclear etiology.  Stool culture and C. difficile negative.  Not much output, but with fluid-filled colon seen on CT initially, will try to give something to help move his bowels.

## 2022-11-19 NOTE — H&P (Signed)
History and Physical    Patient: Lucas [name] DOB: 01-06-2001 DOA: 11/19/2022 DOS: the patient was seen and examined on 11/19/2022 PCP: [name], MD  Patient coming from: Home  Chief Complaint:  Chief Complaint  Patient presents with   Cough   Most of the history was obtained from his mother at the bedside. HPI: Lucas [name] is a 21 y.o. male with medical history significant for Duchenne's muscular dystrophy, seizures, autism by EMS for evaluation of feeling unwell for 2 days. Patient's mother states that he has had loose, foul-smelling, watery stools for several days.  She denies recent antibiotic use and states that he has had no sick contacts but usually has caregivers around-the-clock. Patient's mother notes that he has had a fever with a Tmax of 99 and a nonproductive cough and states that he had similar symptoms in March and was diagnosed with pneumonia. Patient was tachycardic upon arrival to the ER and had a CT angiogram done which was negative for pulmonary embolism but showed possible consolidation atelectasis in the left lower lobe. Patient received a dose of IV Rocephin and Zithromax and will be referred to observation status for further evaluation.   Review of Systems: unable to review all systems due to the inability of the patient to answer questions. Past Medical History:  Diagnosis Date   Autism    Autism    Community acquired pneumonia of left lower lobe of lung 03/03/2022   Diarrhea 06/09/2015   Family history of adverse reaction to anesthesia    MGGM- N/V   Muscular dystrophy ([place])    Scoliosis    Seizures ([place])    Last one 2013   Sepsis ([place]) 03/03/2022   Transient alteration of awareness    Viral gastroenteritis    Yeast infection of the skin 10/24/2020   Past Surgical History:  Procedure Laterality Date   CIRCUMCISION     at birth   LAPAROSCOPIC GASTROSTOMY N/A 05/24/2017   Procedure: LAPAROSCOPIC GASTROSTOMY TUBE PLACEMENT;   Surgeon: Stanford Scotland, MD;  Location: [facility];  Service: General;  Laterality: N/A;   [place] 06/29/2020   Social History:  reports that he has never smoked. He has been exposed to tobacco smoke. He has never used smokeless tobacco. He reports that he does not drink alcohol and does not use drugs.  Allergies  Allergen Reactions   Penicillins Hives and Rash    Has patient had a PCN reaction causing immediate rash, facial/tongue/throat swelling, SOB or lightheadedness with hypotension: Yes Has patient had a PCN reaction causing severe rash involving mucus membranes or skin necrosis: Yes Has patient had a PCN reaction that required hospitalization: No Has patient had a PCN reaction occurring within the last 10 years: No If all of the above answers are "NO", then may proceed with Cephalosporin use.    Docusate Sodium     Rash and cant sleep   Multivitamins     Red in face, itchy, bumps   Other Other (See Comments)    Senkot -- gets rash and cant sleep   Calcitonin Rash   Esomeprazole Magnesium Nausea And Vomiting   Omeprazole Other (See Comments)    Constipation and possible rash after drug was stopped   Simethicone Palpitations and Rash   Sunflower Oil Itching, Rash and Other (See Comments)    GI upset - Avoid products containing this additive   Vitamin D Analogs Other (See Comments)    Rash  on face if it contains sunflower seed oil Excessive urine output and thirsty, increase in gas    Family History  Problem Relation Age of Onset   Cancer Paternal Grandfather        died at age 65   Dementia Paternal Grandmother        died at age 61   Other Mother        in special education, a carrier for Duchenne Muscular Dystrophy   Hyperlipidemia Mother    Learning disabilities Mother    Other Maternal Uncle        Duchenne Muscular Dystrophy, was in special educations   Other Maternal Aunt        was in special education   Other Maternal Grandmother        a  carrier for Duchenne Muscular Dystrophy/Died due to septic shock at 21 years old    Heart failure Maternal Grandmother    Hypertension Maternal Grandmother    Alcohol abuse Father    Asthma Other    Hyperlipidemia Other    Hypertension Other    Vision loss Other     Prior to Admission medications   Medication Sig Start Date End Date Taking? Authorizing Provider  acetaminophen (TYLENOL) 160 MG/5ML solution Take 10 mLs (320 mg total) by mouth every 6 (six) hours as needed for mild pain, moderate pain, fever or headache. 03/04/22   Lorella Nimrod, MD  carbamazepine (TEGRETOL) 100 MG chewable tablet TAKE 1 & 1/2 TABLETS BY MOUTH TWICE A DAY 08/25/22   Rocky Link, MD  levalbuterol Penne Lash) 1.25 MG/3ML nebulizer solution Take 1.25 mg by nebulization every 4 (four) hours as needed. 03/03/22   [provider]  Nutritional Supplements (PEDIASURE GROW & GAIN) LIQD Drink 1 bottle spaced out 6 times a day Patient taking differently: Drink 1 bottle spaced out 5 times a day 06/08/22   Lin Landsman, MD  nystatin (MYCOSTATIN/NYSTOP) powder Apply 1 Application topically 3 (three) times daily. 08/02/22   [provider]  polyethylene glycol powder (GOODSENSE CLEARLAX) 17 GM/SCOOP powder Take 17 g by mouth 2 (two) times daily. With prune juice 09/09/22   Lin Landsman, MD  prednisoLONE (ORAPRED) 15 MG/5ML solution PLACE 3 ML INTO FEEDING TUBE DAILY 08/25/22   Rocky Link, MD    Physical Exam: Vitals:   11/19/22 0934 11/19/22 0938 11/19/22 1402 11/19/22 1427  BP: 124/88  130/88   Pulse: (!) 125  (!) 132   Resp: 20  20   Temp: 99.4 F (37.4 C)   98 F (36.7 C)  TempSrc: Axillary   Oral  SpO2: 98%  93%   Weight:  52.2 kg    Height:  5' (1.524 m)     Physical Exam Vitals and nursing note reviewed.  Constitutional:      Comments: Does not respond to verbal stimuli  HENT:     Head: Normocephalic and atraumatic.     Nose: Nose normal.     Mouth/Throat:      Mouth: Mucous membranes are moist.  Eyes:     Conjunctiva/sclera: Conjunctivae normal.  Cardiovascular:     Rate and Rhythm: Tachycardia present.  Pulmonary:     Effort: Pulmonary effort is normal.     Comments: Scattered rhonchi Abdominal:     General: Abdomen is flat. Bowel sounds are normal.     Palpations: Abdomen is soft.     Comments: PEG tube in place  Musculoskeletal:     Cervical  back: Normal range of motion and neck supple.     Comments: Contracted lower extremities  Skin:    General: Skin is warm and dry.  Neurological:     Mental Status: He is alert.     Comments: Alert and awake  Psychiatric:        Mood and Affect: Mood normal.        Behavior: Behavior normal.     Data Reviewed: Relevant notes from primary care and specialist visits, past discharge summaries as available in EHR, including Care Everywhere. Prior diagnostic testing as pertinent to current admission diagnoses Updated medications and problem lists for reconciliation ED course, including vitals, labs, imaging, treatment and response to treatment Triage notes, nursing and pharmacy notes and ED provider's notes Notable results as noted in HPI Labs reviewed.  Troponin 13, sodium 139, potassium 4.4, chloride 105, bicarb 26, glucose 128, BUN 7, creatinine less than 0.30, white count 9.5, hemoglobin 15.1, hematocrit 45, platelet count 193 Respiratory viral panel is negative Chest x-ray reviewed by me shows no active cardiopulmonary disease CT scan of abdomen and pelvis shows no acute CT findings of the abdomen or pelvis to explain abdominal pain. Colon is fluid-filled to the rectum, consistent with diarrheal illness. Twelve-lead EKG reviewed by me shows sinus tachycardia There are no new results to review at this time.  Assessment and Plan: * Dehydration Secondary to GI losses from diarrhea illness Hydrate patient Supportive care   Lobar pneumonia [facility]) Patient presents to the ER for evaluation of  feeling unwell and is noted to be tachycardic.  He also has a nonproductive cough and a low-grade fever as well as diarrhea Will continue empiric antibiotic therapy with Rocephin and Zithromax Obtain urine Legionella antigen Results of blood cultures  Seizure (HCC) Continue carbamazepine  Duchenne muscular dystrophy (HCC) Continue supportive care  OSA (obstructive sleep apnea) Continue BiPAP at bedtime  Diarrhea Unclear etiology rule out viral versus bacterial Supportive care with IV fluid hydration Send stool PCR as well as C. difficile  Autism spectrum disorder with accompanying language impairment and intellectual disability, requiring very substantial support Requires assistance with all ADL's      Advance Care Planning:   Code Status: Full Code   Consults: None  Family Communication: Greater than 50% of time was spent discussing patient's condition and plan of care with his mother at the bedside.  All questions and concerns have been addressed.  She verbalizes understanding and agrees with the plan.  Severity of Illness: The appropriate patient status for this patient is OBSERVATION. Observation status is judged to be reasonable and necessary in order to provide the required intensity of service to ensure the patient's safety. The patient's presenting symptoms, physical exam findings, and initial radiographic and laboratory data in the context of their medical condition is felt to place them at decreased risk for further clinical deterioration. Furthermore, it is anticipated that the patient will be medically stable for discharge from the hospital within 2 midnights of admission.   Author: Collier Bullock, MD 11/19/2022 3:55 PM  For on call review [email or web address].

## 2022-11-20 DIAGNOSIS — G4733 Obstructive sleep apnea (adult) (pediatric): Secondary | ICD-10-CM | POA: Diagnosis present

## 2022-11-20 DIAGNOSIS — G7101 Duchenne or Becker muscular dystrophy: Secondary | ICD-10-CM

## 2022-11-20 DIAGNOSIS — F809 Developmental disorder of speech and language, unspecified: Secondary | ICD-10-CM | POA: Diagnosis present

## 2022-11-20 DIAGNOSIS — R197 Diarrhea, unspecified: Secondary | ICD-10-CM | POA: Diagnosis present

## 2022-11-20 DIAGNOSIS — F79 Unspecified intellectual disabilities: Secondary | ICD-10-CM | POA: Diagnosis present

## 2022-11-20 DIAGNOSIS — A419 Sepsis, unspecified organism: Secondary | ICD-10-CM | POA: Diagnosis not present

## 2022-11-20 DIAGNOSIS — J181 Lobar pneumonia, unspecified organism: Secondary | ICD-10-CM | POA: Diagnosis present

## 2022-11-20 DIAGNOSIS — Z880 Allergy status to penicillin: Secondary | ICD-10-CM | POA: Diagnosis not present

## 2022-11-20 DIAGNOSIS — Z7401 Bed confinement status: Secondary | ICD-10-CM | POA: Diagnosis not present

## 2022-11-20 DIAGNOSIS — E860 Dehydration: Secondary | ICD-10-CM | POA: Diagnosis not present

## 2022-11-20 DIAGNOSIS — G40109 Localization-related (focal) (partial) symptomatic epilepsy and epileptic syndromes with simple partial seizures, not intractable, without status epilepticus: Secondary | ICD-10-CM | POA: Diagnosis present

## 2022-11-20 DIAGNOSIS — E162 Hypoglycemia, unspecified: Secondary | ICD-10-CM | POA: Diagnosis not present

## 2022-11-20 DIAGNOSIS — M810 Age-related osteoporosis without current pathological fracture: Secondary | ICD-10-CM | POA: Diagnosis present

## 2022-11-20 DIAGNOSIS — J984 Other disorders of lung: Secondary | ICD-10-CM | POA: Diagnosis present

## 2022-11-20 DIAGNOSIS — Z1152 Encounter for screening for COVID-19: Secondary | ICD-10-CM | POA: Diagnosis not present

## 2022-11-20 DIAGNOSIS — Z79899 Other long term (current) drug therapy: Secondary | ICD-10-CM | POA: Diagnosis not present

## 2022-11-20 DIAGNOSIS — E876 Hypokalemia: Secondary | ICD-10-CM | POA: Diagnosis not present

## 2022-11-20 DIAGNOSIS — Z888 Allergy status to other drugs, medicaments and biological substances status: Secondary | ICD-10-CM | POA: Diagnosis not present

## 2022-11-20 DIAGNOSIS — Z91018 Allergy to other foods: Secondary | ICD-10-CM | POA: Diagnosis not present

## 2022-11-20 DIAGNOSIS — Z931 Gastrostomy status: Secondary | ICD-10-CM | POA: Diagnosis not present

## 2022-11-20 DIAGNOSIS — F840 Autistic disorder: Secondary | ICD-10-CM | POA: Diagnosis present

## 2022-11-20 LAB — BLOOD CULTURE ID PANEL (REFLEXED) - BCID2

## 2022-11-20 LAB — CBC
HCT: 38.8 % — ABNORMAL LOW (ref 39.0–52.0)
Hemoglobin: 13 g/dL (ref 13.0–17.0)
MCH: 30 pg (ref 26.0–34.0)
MCHC: 33.5 g/dL (ref 30.0–36.0)
MCV: 89.6 fL (ref 80.0–100.0)
Platelets: 209 10*3/uL (ref 150–400)
RBC: 4.33 MIL/uL (ref 4.22–5.81)
RDW: 13.3 % (ref 11.5–15.5)
WBC: 10.1 10*3/uL (ref 4.0–10.5)
nRBC: 0 % (ref 0.0–0.2)

## 2022-11-20 LAB — RESPIRATORY PANEL BY PCR

## 2022-11-20 LAB — BASIC METABOLIC PANEL
Anion gap: 10 (ref 5–15)
BUN: 5 mg/dL — ABNORMAL LOW (ref 6–20)
CO2: 23 mmol/L (ref 22–32)
Calcium: 8.7 mg/dL — ABNORMAL LOW (ref 8.9–10.3)
Chloride: 106 mmol/L (ref 98–111)
Creatinine, Ser: <0.3 mg/dL — ABNORMAL LOW (ref 0.61–1.24)
Glucose, Bld: 64 mg/dL — ABNORMAL LOW (ref 70–99)
Potassium: 2.6 mmol/L — CL (ref 3.5–5.1)
Sodium: 139 mmol/L (ref 135–145)

## 2022-11-20 LAB — GLUCOSE, CAPILLARY: Glucose-Capillary: 88 mg/dL (ref 70–99)

## 2022-11-20 LAB — PROCALCITONIN: Procalcitonin: <0.1 ng/mL

## 2022-11-20 LAB — MAGNESIUM: Magnesium: 1.8 mg/dL (ref 1.7–2.4)

## 2022-11-20 MED ORDER — SODIUM CHLORIDE 0.9 % IV SOLN
2.0000 g | Freq: Three times a day (TID) | INTRAVENOUS | Status: DC
  Administered 2022-11-20 – 2022-11-21 (×3): 2 g via INTRAVENOUS
  Filled 2022-11-20 (×2): qty 2
  Filled 2022-11-20: qty 12.5
  Filled 2022-11-20: qty 2

## 2022-11-20 MED ORDER — PEDIASURE 1.5 CAL PO LIQD: 170.0000 mL | Freq: Four times a day (QID) | ORAL | Status: DC

## 2022-11-20 MED ORDER — SODIUM CHLORIDE 0.9 % IV SOLN
500.0000 mg | INTRAVENOUS | Status: AC
  Administered 2022-11-20 – 2022-11-21 (×2): 500 mg via INTRAVENOUS
  Filled 2022-11-20 (×2): qty 500

## 2022-11-20 MED ORDER — PEDIASURE GROW & GAIN PO LIQD
237.0000 mL | Freq: Four times a day (QID) | ORAL | Status: DC
  Administered 2022-11-21 – 2022-11-22 (×6): 237 mL
  Filled 2022-11-20 (×2): qty 1
  Filled 2022-11-20: qty 4
  Filled 2022-11-20 (×3): qty 1

## 2022-11-20 MED ORDER — JEVITY 1.5 CAL/FIBER PO LIQD: 237.0000 mL | Freq: Every day | ORAL | Status: DC

## 2022-11-20 MED ORDER — KCL IN DEXTROSE-NACL 40-5-0.9 MEQ/L-%-% IV SOLN
INTRAVENOUS | Status: DC
  Filled 2022-11-20 (×9): qty 1000

## 2022-11-20 MED ORDER — DEXTROSE-NACL 5-0.9 % IV SOLN: INTRAVENOUS | Status: DC

## 2022-11-20 MED ORDER — FREE WATER
30.0000 mL | Status: DC
  Administered 2022-11-20 (×10): 30 mL

## 2022-11-20 MED ORDER — POTASSIUM CITRATE-CITRIC ACID 1100-334 MG/5ML PO SOLN
40.0000 meq | Freq: Once | ORAL | Status: AC
  Administered 2022-11-20: 40 meq
  Filled 2022-11-20 (×2): qty 20

## 2022-11-20 MED ORDER — POTASSIUM CHLORIDE 10 MEQ/100ML IV SOLN
10.0000 meq | INTRAVENOUS | Status: AC
  Administered 2022-11-20 (×4): 10 meq via INTRAVENOUS
  Filled 2022-11-20 (×2): qty 100

## 2022-11-20 MED ORDER — PEDIASURE 1.5 CAL PO LIQD: 170.0000 mL | Freq: Every day | ORAL | Status: DC

## 2022-11-20 MED ORDER — FREE WATER
120.0000 mL | Freq: Every day | Status: DC
  Administered 2022-11-20 – 2022-11-22 (×13): 120 mL

## 2022-11-20 MED ORDER — DEXTROSE 50 % IV SOLN
12.5000 g | Freq: Once | INTRAVENOUS | Status: AC
  Administered 2022-11-20: 12.5 g via INTRAVENOUS
  Filled 2022-11-20: qty 50

## 2022-11-20 MED ORDER — SODIUM CHLORIDE 0.9 % IV SOLN: INTRAVENOUS | Status: DC

## 2022-11-20 MED ORDER — METRONIDAZOLE 500 MG/100ML IV SOLN
500.0000 mg | Freq: Two times a day (BID) | INTRAVENOUS | Status: DC
  Administered 2022-11-20 – 2022-11-22 (×5): 500 mg via INTRAVENOUS
  Filled 2022-11-20 (×5): qty 100

## 2022-11-20 NOTE — Progress Notes (Addendum)
Initial Nutrition Assessment RD working remotely.   DOCUMENTATION CODES:   Not applicable  INTERVENTION:  - changed 1 carton (237 ml) Pediasure 1.5 x5/day to infuse at 170 ml/hr per bolus to 1 carton (237 ml) Jevity 1.5 x5/day to infuse at 170 ml/hr per bolus.  - this regimen will provide 1775 kcal, 75.5 grams protein, 25 grams fiber, and 900 ml water.  - checking a serum magnesium given hypokalemia this AM.  - do not initiate feeds until serum magnesium results and is > 1 mg/dl.   - added an additional 60 ml water before and after each tube feeding bolus due to fiber content.  - complete NFPE when feasible.   NUTRITION DIAGNOSIS:   Inadequate oral intake related to inability to eat as evidenced by NPO status.  GOAL:   Patient will meet greater than or equal to 90% of their needs  MONITOR:   TF tolerance, Labs, Weight trends  REASON FOR ASSESSMENT:   Consult Enteral/tube feeding initiation and management  ASSESSMENT:   21 y.o. male with medical history of Duchenne's muscular dystrophy, seizures, and autism. He presented to the ED via EMS for evaluation of feeling unwell for 2 days. Patient's mother stated that he has had loose, foul-smelling, watery stools for several days.  She denies recent antibiotic use and states that he has had no sick contacts but usually has caregivers around-the-clock. Patient's mother notes that he has had a fever with a Tmax of 99 and a non-productive cough and stated that he had similar symptoms in March at which time he was diagnosed with pneumonia. In the ED, CT angiogram was negative for pulmonary embolism but showed possible consolidation atelectasis in the left lower lobe.  RD called listed phone number for patient's mom, Olivia Mackie, this morning. Phone rang x10 at which time RD ended the call; had not reached voicemail.  Patient was being followed by an outpatient [place] RD 11/2021-12/2021. He was see in person by a [place] RD on  03/04/22 at which time he was ordered 1 carton (237 ml) Jevity 1.5 QID.   Consult received for patient receiving Pediasure 1.5 at home and request to change formula due to this being unavailable at [facility]. It is unclear if patient receives Pediasure 1.5 with fiber or without fiber.   Change to formula outlined above. Order in place for 30 ml water every hour (720 ml/day).  Weight yesterday was 117 lb and weight is the highest been with previous 10 weight recordings going back to 06/2021. Mild pitting edema to BLE documented in the edema section of flow sheet.   Labs reviewed; K: 2.6 mmol/l (4.4 mmol/l on 12/9), BUN: 5 mg/dl, creatinine: <0.3 mg/dl, Ca: 8.7 mg/dl.  Medications reviewed; 10 mEq IV KCl x4 runs 12/10.  IVF; D5-NS-40 mEq KCl @ 125 ml/hr (510 kcal/24 hrs).    NUTRITION - FOCUSED PHYSICAL EXAM:  Patient with hx of muscular dystrophy; deferred.  Diet Order:   Diet Order             Diet NPO time specified  Diet effective now                   EDUCATION NEEDS:   No education needs have been identified at this time  Skin:  Skin Assessment: Reviewed RN Assessment  Last BM:  12/9 (type 7 x3, once was a smear)  Height:   Ht Readings from Last 1 Encounters:  11/19/22 5' (1.524 m)    Weight:  Wt Readings from Last 1 Encounters:  11/19/22 53.1 kg     BMI:  Body mass index is 22.86 kg/m.  Estimated Nutritional Needs:  Kcal:  1500-1700 kcal Protein:  65-80 grams Fluid:  >/= 1.7 L/day     Jarome Matin, MS, RD, LDN, CNSC Clinical Dietitian PRN/Relief staff On-call/weekend pager # available in [facility]

## 2022-11-20 NOTE — Assessment & Plan Note (Addendum)
Secondary to pneumonia versus abdominal infection.  Criteria for sepsis on admission given fever, tachycardia and tachypnea with sources of either pneumonia versus abdominal infection with diarrhea.  Given normal procalcitonin, and more likely think this is from abdominal infection possibly colitis.  C. difficile and stool cultures are negative.  Continued on aggressive antibiotics and fluids.  1 positive blood culture for Staph epidermidis, likely contaminant.  By 12/12, patient acting more like himself heart rate still elevated although improved from on admission and in review of previous hospitalizations, patient's heart rate always is mildly tachycardic.  Discharged on 5 more doses of Flagyl and Omnicef to be given through his PEG tube for total of 5 days of therapy

## 2022-11-20 NOTE — Hospital Course (Signed)
21 year old with past medical history of Duchenne's muscular dystrophy seizure disorder and autism who presented to the emergency room on the morning 12/9 with several days of watery stools and low-grade fever and cough.  Presentation was similar March when patient had pneumonia.  Workup revealed sepsis secondary to possible pneumonia versus abdominal infection.

## 2022-11-20 NOTE — Assessment & Plan Note (Signed)
Marked change with normal potassium on 12/9 then dropped to 2.6 on 12/10.  Multifactorial due to GI losses from diarrhea as well as fluid shifts from hypoglycemia.  Treating with potassium IV runs plus given that this is ongoing from diarrhea, have added potassium to IV fluids.

## 2022-11-20 NOTE — Progress Notes (Addendum)
NUTRITION NOTE  Secure chat message received from RN who shares that patient reports that he only tolerates Pediasure Grow and Gain as all other tube feeding options make him sick.   Notified staff that [place] and Gain is not available on hospital formulary and will need to be brought in from home.  RD able to call into patient's room and talk with his mom. She shares that patient only tolerates Pediasure Grow and Gain and with all other tube feeding formulas he vomits. When asked if there is a formula patient can use if Pediasure Grow and Gain is not available, mom states there is not.  Shared with mom that this formula is not on hospital formulary and will need to be brought in from home. Mom shares that she does not have a car, that she has no way to go home to retreive patient's formula, and that there is no one else who is able to bring it to the hospital for them.   Patient receives 1 bottle (237 ml) Pediasure Grow and Gain QID via PEG. This regimen provides 960 kcal and 28 grams protein. He has received this volume of the formula for the entirety of his time using it.   RD able to communicate with RD who covers Pediatrics at [facility]. While it is not on formulary there either, they do have Pediasure Grow and Gain available.  RD able to communicate with Pharmacist who called [facility]. Pediasure 1.5 cal, Pediasure 1.5 cal with fiber, Pediasure Enteral formula 1.0 cal, Pediasure Peptide 1.0 cal and Pediasure Peptide 1.5 cal are the only Pediasure products currently in house at [facility].Marland Kitchen Pharmacist also checking with purchaser on feasibility of special ordering this formula for patient.      Jarome Matin, MS, RD, LDN, CNSC Clinical Dietitian PRN/Relief staff On-call/weekend pager # available in [facility]

## 2022-11-20 NOTE — Progress Notes (Signed)
   11/20/22 1231  Assess: MEWS Score  Temp 99.1 F (37.3 C)  BP 134/85  MAP (mmHg) 100  Pulse Rate (!) 124  Resp 16  Level of Consciousness Alert  SpO2 98 %  O2 Device Room Air  Assess: MEWS Score  MEWS Temp 0  MEWS Systolic 0  MEWS Pulse 2  MEWS RR 0  MEWS LOC 0  MEWS Score 2  MEWS Score Color Yellow  Assess: if the MEWS score is Yellow or Red  Were vital signs taken at a resting state? Yes  Focused Assessment Change from prior assessment (see assessment flowsheet)  Does the patient meet 2 or more of the SIRS criteria? Yes  Does the patient have a confirmed or suspected source of infection? Yes  Provider and Rapid Response Notified? No  MEWS guidelines implemented *See Row Information* Yes  Treat  Pain Scale PAINAD  Breathing 0  Negative Vocalization 0  Facial Expression 0  Body Language 0  Consolability 0  PAINAD Score 0  Take Vital Signs  Increase Vital Sign Frequency  Yellow: Q 2hr X 2 then Q 4hr X 2, if remains yellow, continue Q 4hrs  Escalate  MEWS: Escalate Yellow: discuss with charge nurse/RN and consider discussing with provider and RRT  Notify: Charge Nurse/RN  Name of Charge Nurse/RN Notified Robyn, RN  Date Charge Nurse/RN Notified 11/20/22  Time Charge Nurse/RN Notified 1231  Provider Notification  Provider Name/Title Dr.Krishnan  Date Provider Notified 11/20/22  Time Provider Notified 1231  Method of Notification  (messaged)  Notification Reason Change in status  Provider response No new orders  Date of Provider Response 11/20/22  Time of Provider Response 1234  Document  Patient Outcome Other (Comment) (will monitor)  Progress note created (see row info) Yes  Assess: SIRS CRITERIA  SIRS Temperature  0  SIRS Pulse 1  SIRS Respirations  0  SIRS WBC 1  SIRS Score Sum  2

## 2022-11-20 NOTE — Progress Notes (Signed)
PHARMACY - PHYSICIAN COMMUNICATION CRITICAL VALUE ALERT - BLOOD CULTURE IDENTIFICATION (BCID)  Lucas [name] is an 21 y.o. male who presented to [facility] on 11/19/2022 with a chief complaint of dehydration from diarrheal illness / lobar pneumonia  Assessment:  1/4 bottles GPC. BCID detected Staphylococcus epidermidis, no resistance. Suspect contaminant.   Name of physician (or Provider) Contacted: Dr. Maryland Pink  Current antibiotics: Cefepime + Flagyl + azithromycin  Changes to prescribed antibiotics recommended:  Patient is on recommended antibiotics - No changes needed  Results for orders placed or performed during the hospital encounter of 11/19/22  Blood Culture ID Panel (Reflexed) (Collected: 11/19/2022  2:59 PM)  Result Value Ref Range   Enterococcus faecalis NOT DETECTED NOT DETECTED   Enterococcus Faecium NOT DETECTED NOT DETECTED   Listeria monocytogenes NOT DETECTED NOT DETECTED   Staphylococcus species DETECTED (A) NOT DETECTED   Staphylococcus aureus (BCID) NOT DETECTED NOT DETECTED   Staphylococcus epidermidis DETECTED (A) NOT DETECTED   Staphylococcus lugdunensis NOT DETECTED NOT DETECTED   Streptococcus species NOT DETECTED NOT DETECTED   Streptococcus agalactiae NOT DETECTED NOT DETECTED   Streptococcus pneumoniae NOT DETECTED NOT DETECTED   Streptococcus pyogenes NOT DETECTED NOT DETECTED   A.calcoaceticus-baumannii NOT DETECTED NOT DETECTED   Bacteroides fragilis NOT DETECTED NOT DETECTED   Enterobacterales NOT DETECTED NOT DETECTED   Enterobacter cloacae complex NOT DETECTED NOT DETECTED   Escherichia coli NOT DETECTED NOT DETECTED   Klebsiella aerogenes NOT DETECTED NOT DETECTED   Klebsiella oxytoca NOT DETECTED NOT DETECTED   Klebsiella pneumoniae NOT DETECTED NOT DETECTED   Proteus species NOT DETECTED NOT DETECTED   Salmonella species NOT DETECTED NOT DETECTED   Serratia marcescens NOT DETECTED NOT DETECTED   Haemophilus influenzae NOT DETECTED NOT  DETECTED   Neisseria meningitidis NOT DETECTED NOT DETECTED   Pseudomonas aeruginosa NOT DETECTED NOT DETECTED   Stenotrophomonas maltophilia NOT DETECTED NOT DETECTED   Candida albicans NOT DETECTED NOT DETECTED   Candida auris NOT DETECTED NOT DETECTED   Candida glabrata NOT DETECTED NOT DETECTED   Candida krusei NOT DETECTED NOT DETECTED   Candida parapsilosis NOT DETECTED NOT DETECTED   Candida tropicalis NOT DETECTED NOT DETECTED   Cryptococcus neoformans/gattii NOT DETECTED NOT DETECTED   Methicillin resistance mecA/C NOT DETECTED NOT DETECTED    Benita Gutter 11/20/2022  11:40 AM

## 2022-11-20 NOTE — Progress Notes (Signed)
Patient placed on CPAP with Auto-set pressures 4-15 cm on room air, SpO2 97%. Mother at bedside, stated that she does not know home settings but will try to have them by tomorrow.

## 2022-11-20 NOTE — Progress Notes (Signed)
Date and time results received: 11/20/22 0650  Test: K+  Critical Value: 2.9  Name of Provider Notified: Randol Kern

## 2022-11-20 NOTE — Progress Notes (Signed)
[facility] Progress Note  Patient: Lucas [name]    FMB:[id number]  DOA: 11/19/2022    Date of Service: the patient was seen and examined on 11/20/2022  Brief hospital course: 21 year old with past medical history of Duchenne's muscular dystrophy seizure disorder and autism who presented to the emergency room on the morning 12/9 with several days of watery stools and low-grade fever and cough.  Presentation was similar March when patient had pneumonia.  Workup revealed sepsis secondary to possible pneumonia versus abdominal infection.   Assessment and Plan: * Sepsis ([place]) Secondary to pneumonia versus abdominal infection.  Criteria for sepsis on admission given fever, tachycardia and tachypnea with sources of either pneumonia versus abdominal infection with diarrhea.  Given normal procalcitonin, and more likely think this is from abdominal infection possibly colitis.  C. difficile and stool cultures are negative.  Continue aggressive antibiotics and fluids.  1 positive blood culture for Staph epidermidis, likely contaminant.  Lobar pneumonia [facility]) Patient presents to the ER for evaluation of feeling unwell and is noted to be tachycardic.  He also has a nonproductive cough and a low-grade fever as well as diarrhea.  See above, given I think that this is may be more likely abdominal infection, will change antibiotics to cover both pneumonia and GI tract with cefepime and Flagyl.  Cough could possibly be to diaphragmatic irritation from colonic infection rather than true pneumonia.  Patient unable to produce sputum culture.  Diarrhea Unclear etiology rule out viral versus bacterial Supportive care with IV fluid hydration Send stool PCR as well as C. difficile  Hypokalemia Marked change with normal potassium on 12/9 then dropped to 2.6 on 12/10.  Multifactorial due to GI losses from diarrhea as well as fluid shifts from hypoglycemia.  Treating with potassium IV runs plus given that this is  ongoing from diarrhea, have added potassium to IV fluids.  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  IV fluids changed to D5 after patient given amp of D50.  No previous history of diabetes.  Not on any diabetic medications.  Secondary to sepsis and not getting tube feedings  Duchenne muscular dystrophy ([place]) Continue supportive care  Seizure ([place]) Continue carbamazepine  OSA (obstructive sleep apnea) Continue BiPAP at bedtime       Body mass index is 22.86 kg/m.  Nutrition Problem: Inadequate oral intake Etiology: inability to eat     Consultants: None  Procedures: None  Antimicrobials: IV Rocephin and Zithromax 12/9 - 12/10 IV cefepime and Flagyl 12/10-present  Code Status: Full code   Subjective: Patient verbalizes little, but states repeatedly he wants to go home  Objective: Noted tachycardia Vitals:   11/20/22 0502 11/20/22 1231  BP: 103/68 134/85  Pulse: (!) 102 (!) 124  Resp: 20 16  Temp: 98.9 F (37.2 C) 99.1 F (37.3 C)  SpO2: 95% 98%    Intake/Output Summary (Last 24 hours) at 11/20/2022 1255 Last data filed at 11/20/2022 0810 Gross per 24 hour  Intake 2248.38 ml  Output 1400 ml  Net 848.38 ml   Filed Weights   11/19/22 0938 11/19/22 2320  Weight: 52.2 kg 53.1 kg   Body mass index is 22.86 kg/m.  Exam:  General: Awake, oriented x 1 HEENT: Normocephalic, atraumatic, mucous membranes slightly dry Cardiovascular: Regular rhythm, tachycardic Respiratory: Poor inspiratory effort, otherwise clear Abdomen: Soft, distended, hyperactive bowel sounds, nontender Musculoskeletal: No clubbing or  cyanosis, trace pitting edema Skin: No skin breaks, tears or lesions Psychiatry: Underlying autism Neurology: Limited due to muscular dystrophy  Data Reviewed: Potassium of 2.6, glucose  of 64, normal procalcitonin,  Disposition:  Status is: Inpatient    Anticipated discharge date: 12/12  Remaining issues to be resolved so that patient can be discharged:  -Stabilization of sepsis   Family Communication: Mother at the bedside DVT Prophylaxis: enoxaparin (LOVENOX) injection 40 mg Start: 11/19/22 2200    Author: Annita Brod ,MD 11/20/2022 12:55 PM  To reach On-call, see care teams to locate the attending and reach out via [email or web address]. Between 7PM-7AM, please contact night-coverage If you still have difficulty reaching the attending provider, please page the [facility] (Director on Call) for [facility] on amion for assistance.

## 2022-11-20 NOTE — Progress Notes (Signed)
       CROSS COVER NOTE  NAME: Lucas [name] MRN: [id number] DOB : 08-12-2001    HPI/Events of Note   Notified of nursing concerns with transfer to floor secondary to "RED MEWS" and elevated heart rate 120  Assessment and  Interventions   Assessment: Patient with known infection and baseline heart rate of 100 Rectal temp requested + 101. Acetaminophen admin requested, not done initially cause liquid tylenol was ordered orally Plan: Increase maintenance fluids to 125/ h PR acetaminophen ordered       Kathlene Cote NP [facility]

## 2022-11-20 NOTE — Progress Notes (Signed)
       CROSS COVER NOTE  NAME: BROXTON BROADY MRN: [id number] DOB : 2000/12/13   HPI/Events of Note   Potassium reported 2.6 Blood glucose 64 on cmp Pediasure not available here for his bolus feeds  Assessment and  Interventions   Assessment:  Plan: 1/2 amp D50 MIVF D5NS 125/h Cbg every 4 Dietician consult for substitute feed Potassium 49 meq via peg and [place] NP [facility]

## 2022-11-20 NOTE — Assessment & Plan Note (Signed)
Noted on morning of 5/10.  IV fluids changed to D5 after patient given amp of D50.  No previous history of diabetes.  Not on any diabetic medications.  Secondary to sepsis and not getting tube feedings.  Resolved as infection treated

## 2022-11-21 DIAGNOSIS — J181 Lobar pneumonia, unspecified organism: Secondary | ICD-10-CM | POA: Diagnosis not present

## 2022-11-21 DIAGNOSIS — A419 Sepsis, unspecified organism: Secondary | ICD-10-CM | POA: Diagnosis not present

## 2022-11-21 DIAGNOSIS — G7101 Duchenne or Becker muscular dystrophy: Secondary | ICD-10-CM | POA: Diagnosis not present

## 2022-11-21 DIAGNOSIS — R197 Diarrhea, unspecified: Secondary | ICD-10-CM | POA: Diagnosis not present

## 2022-11-21 LAB — BASIC METABOLIC PANEL
Anion gap: 11 (ref 5–15)
BUN: <5 mg/dL — ABNORMAL LOW (ref 6–20)
CO2: 23 mmol/L (ref 22–32)
Calcium: 8.7 mg/dL — ABNORMAL LOW (ref 8.9–10.3)
Chloride: 102 mmol/L (ref 98–111)
Creatinine, Ser: <0.3 mg/dL — ABNORMAL LOW (ref 0.61–1.24)
Glucose, Bld: 142 mg/dL — ABNORMAL HIGH (ref 70–99)
Potassium: 3.1 mmol/L — ABNORMAL LOW (ref 3.5–5.1)
Sodium: 136 mmol/L (ref 135–145)

## 2022-11-21 LAB — GLUCOSE, CAPILLARY
Glucose-Capillary: 101 mg/dL — ABNORMAL HIGH (ref 70–99)
Glucose-Capillary: 146 mg/dL — ABNORMAL HIGH (ref 70–99)
Glucose-Capillary: 148 mg/dL — ABNORMAL HIGH (ref 70–99)
Glucose-Capillary: 151 mg/dL — ABNORMAL HIGH (ref 70–99)
Glucose-Capillary: 151 mg/dL — ABNORMAL HIGH (ref 70–99)
Glucose-Capillary: 164 mg/dL — ABNORMAL HIGH (ref 70–99)
Glucose-Capillary: 193 mg/dL — ABNORMAL HIGH (ref 70–99)

## 2022-11-21 LAB — LEGIONELLA PNEUMOPHILA SEROGP 1 UR AG: L. pneumophila Serogp 1 Ur Ag: NEGATIVE

## 2022-11-21 MED ORDER — PREDNISOLONE SODIUM PHOSPHATE 15 MG/5ML PO SOLN
15.0000 mg | Freq: Every day | ORAL | Status: DC
  Administered 2022-11-21 – 2022-11-22 (×2): 15 mg
  Filled 2022-11-21 (×2): qty 5

## 2022-11-21 MED ORDER — SODIUM CHLORIDE 0.9 % IV SOLN
2.0000 g | INTRAVENOUS | Status: DC
  Administered 2022-11-21 – 2022-11-22 (×2): 2 g via INTRAVENOUS
  Filled 2022-11-21: qty 20
  Filled 2022-11-21: qty 2

## 2022-11-21 MED ORDER — POTASSIUM CHLORIDE 20 MEQ PO PACK
40.0000 meq | PACK | Freq: Once | ORAL | Status: AC
  Administered 2022-11-21: 40 meq
  Filled 2022-11-21: qty 2

## 2022-11-21 MED ORDER — POLYETHYLENE GLYCOL 3350 17 G PO PACK
17.0000 g | PACK | Freq: Two times a day (BID) | ORAL | Status: DC
  Administered 2022-11-21 – 2022-11-22 (×2): 17 g via ORAL
  Filled 2022-11-21 (×2): qty 1

## 2022-11-21 NOTE — Progress Notes (Signed)
Patient placed on CPAP for HS, tolerating well.

## 2022-11-21 NOTE — Progress Notes (Signed)
Pt with yellow mews. Pulse trending high from admission. Will continue to monitor.   11/21/22 1519  Assess: MEWS Score  Temp 98.5 F (36.9 C)  BP 125/79  MAP (mmHg) 90  Pulse Rate (!) 136  Resp 18  SpO2 97 %  O2 Device Room Air  Assess: MEWS Score  MEWS Temp 0  MEWS Systolic 0  MEWS Pulse 3  MEWS RR 0  MEWS LOC 0  MEWS Score 3  MEWS Score Color Yellow  Assess: if the MEWS score is Yellow or Red  Were vital signs taken at a resting state? Yes  Focused Assessment No change from prior assessment  Does the patient meet 2 or more of the SIRS criteria? Yes  Does the patient have a confirmed or suspected source of infection? Yes  Provider and Rapid Response Notified? No  MEWS guidelines implemented *See Row Information* Yes  Treat  Pain Scale PAINAD  Breathing 0  Negative Vocalization 0  Facial Expression 0  Body Language 0  Consolability 0  PAINAD Score 0  Take Vital Signs  Increase Vital Sign Frequency  Yellow: Q 2hr X 2 then Q 4hr X 2, if remains yellow, continue Q 4hrs  Escalate  MEWS: Escalate Yellow: discuss with charge nurse/RN and consider discussing with provider and RRT  Document  Patient Outcome Other (Comment) (continuing to monitor)  Progress note created (see row info) Yes  Assess: SIRS CRITERIA  SIRS Temperature  0  SIRS Pulse 1  SIRS Respirations  0  SIRS WBC 1  SIRS Score Sum  2

## 2022-11-21 NOTE — Progress Notes (Signed)
[facility] Progress Note  Patient: Lucas [name]    VZC:[id number]  DOA: 11/19/2022    Date of Service: the patient was seen and examined on 11/21/2022  Brief hospital course: 21 year old with past medical history of Duchenne's muscular dystrophy seizure disorder and autism who presented to the emergency room on the morning 12/9 with several days of watery stools and low-grade fever and cough.  Presentation was similar March when patient had pneumonia.  Workup revealed sepsis secondary to possible pneumonia versus abdominal infection.   Assessment and Plan: * Sepsis ([place]) Secondary to pneumonia versus abdominal infection.  Criteria for sepsis on admission given fever, tachycardia and tachypnea with sources of either pneumonia versus abdominal infection with diarrhea.  Given normal procalcitonin, and more likely think this is from abdominal infection possibly colitis.  C. difficile and stool cultures are negative.  Continue aggressive antibiotics and fluids.  1 positive blood culture for Staph epidermidis, likely contaminant.  Lobar pneumonia [facility]) Patient presents to the ER for evaluation of feeling unwell and is noted to be tachycardic.  He also has a nonproductive cough and a low-grade fever as well as diarrhea.  See above, given I think that this is may be more likely abdominal infection, will change antibiotics to cover both pneumonia and GI tract with cefepime and Flagyl.  Cough could possibly be to diaphragmatic irritation from colonic infection rather than true pneumonia.  Patient unable to produce sputum culture.  Diarrhea Unclear etiology.  Stool culture and C. difficile negative.  Not much output, but with fluid-filled colon seen on CT initially, will try to give something to help move his bowels.  Hypokalemia Marked change with normal potassium on 12/9 then dropped to 2.6 on 12/10.  Multifactorial due to GI losses from diarrhea as well as fluid shifts from hypoglycemia.   Treating with potassium IV runs plus given that this is ongoing from diarrhea, have added potassium to IV fluids.  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  IV fluids changed to D5 after patient given amp of D50.  No previous history of diabetes.  Not on any diabetic medications.  Secondary to sepsis and not getting tube feedings  Duchenne muscular dystrophy ([place]) Continue supportive care  Seizure ([place]) Continue carbamazepine  OSA (obstructive sleep apnea) Continue BiPAP at bedtime       Body mass index is 22.86 kg/m.  Nutrition Problem: Inadequate oral intake Etiology: inability to eat     Consultants: None  Procedures: None  Antimicrobials: IV Rocephin and Zithromax 12/9 -present IV Flagyl 12/10-present IV cefepime 12/10 - 12/11  Code Status: Full code   Subjective: Patient verbalizes little  Objective: Noted tachycardia, although somewhat improved Vitals:   11/21/22 0900 11/21/22 1207  BP: 126/86 118/86  Pulse: (!) 113 (!) 110  Resp:  16  Temp:  98.7 F (37.1 C)  SpO2:  100%    Intake/Output Summary (Last 24 hours) at 11/21/2022 1448 Last data filed at 11/21/2022 1120 Gross per 24 hour  Intake 2861.96 ml  Output 2800 ml  Net 61.96 ml   Filed Weights   11/19/22 0938 11/19/22 2320  Weight: 52.2 kg 53.1 kg   Body mass index is 22.86 kg/m.  Exam:  General: Awake, oriented x 1 HEENT: Normocephalic, atraumatic, mucous membranes slightly dry Cardiovascular: Regular rhythm, improving tachycardia Respiratory: Poor inspiratory effort, otherwise clear Abdomen: Soft, distended, hyperactive bowel sounds,  nontender Musculoskeletal: No clubbing or cyanosis, trace pitting edema Skin: No skin breaks, tears or lesions Psychiatry: Underlying autism Neurology: Limited due to muscular  dystrophy  Data Reviewed: Potassium of 3.1, glucose of 64, normal procalcitonin,  Disposition:  Status is: Inpatient    Anticipated discharge date: 12/12  Remaining issues to be resolved so that patient can be discharged:  -Stabilization of sepsis   Family Communication: Updated mother by phone DVT Prophylaxis: enoxaparin (LOVENOX) injection 40 mg Start: 11/19/22 2200    Author: Annita Brod ,MD 11/21/2022 2:48 PM  To reach On-call, see care teams to locate the attending and reach out via [email or web address]. Between 7PM-7AM, please contact night-coverage If you still have difficulty reaching the attending provider, please page the [facility] (Director on Call) for [facility] on amion for assistance.

## 2022-11-21 NOTE — Progress Notes (Signed)
Nutrition Follow-up  DOCUMENTATION CODES:   Not applicable  INTERVENTION:   -Once formula available:  237 ml Pedia Sure Grown and gain 4 times daily  120 ml free water flush 5 times daily  Regimen provides 960 kcals and 28 grams protein  NUTRITION DIAGNOSIS:   Inadequate oral intake related to inability to eat as evidenced by NPO status.  Ongoing  GOAL:   Patient will meet greater than or equal to 90% of their needs  Progressing   MONITOR:   TF tolerance, Labs, Weight trends  REASON FOR ASSESSMENT:   Consult Enteral/tube feeding initiation and management  ASSESSMENT:   21 y.o. male with medical history of Duchenne's muscular dystrophy, seizures, and autism. He presented to the ED via EMS for evaluation of feeling unwell for 2 days. Patient's mother stated that he has had loose, foul-smelling, watery stools for several days.  She denies recent antibiotic use and states that he has had no sick contacts but usually has caregivers around-the-clock. Patient's mother notes that he has had a fever with a Tmax of 99 and a non-productive cough and stated that he had similar symptoms in March at which time he was diagnosed with pneumonia. In the ED, CT angiogram was negative for pulmonary embolism but showed possible consolidation atelectasis in the left lower lobe.  Reviewed I/O's: -2.7 L x 24 hours and -1.9 L since admission  UOP: 3.8 L x 24 hours   Received message from weekend RD. Pt receives Pediasure Grow and Gain at home and mother reports that this is the only formula that pt can tolerate. Mother is unable to bring formula to the hospital. RD has reached out to pharmacy purchasing; [facility] has some formula in stock and will try to obtain some formula here so pt can receive feedings. RD updated RN.   Pt lying in bed at time of visit. He opened eyes when greeted by this RD, but unable to provide history. No family at bedside. Reached out to [facility] outpatient pediatric RD  who reports she no longer follows pt in clinic.   Medications reviewed and include prednisolone, and dextrose 5% and 0.9% NaCl with KCl 40 mEq/L infusion @ 125 ml/hr.   Labs reviewed: K: 3.1, CBGS: 101-151 (inpatient orders for glycemic control are none).    NUTRITION - FOCUSED PHYSICAL EXAM:  Flowsheet Row Most Recent Value  Orbital Region No depletion  Upper Arm Region No depletion  Thoracic and Lumbar Region No depletion  Buccal Region No depletion  Temple Region No depletion  Clavicle Bone Region No depletion  Clavicle and Acromion Bone Region No depletion  Scapular Bone Region No depletion  Dorsal Hand No depletion  Patellar Region No depletion  Anterior Thigh Region No depletion  Posterior Calf Region No depletion  Edema (RD Assessment) Mild  Hair Reviewed  Eyes Reviewed  Mouth Reviewed  Skin Reviewed  Nails Reviewed       Diet Order:   Diet Order             Diet NPO time specified  Diet effective now                   EDUCATION NEEDS:   No education needs have been identified at this time  Skin:  Skin Assessment: Reviewed RN Assessment  Last BM:  11/21/22 (type 7)  Height:   Ht Readings from Last 1 Encounters:  11/19/22 5' (1.524 m)    Weight:   Wt Readings from Last  1 Encounters:  11/19/22 53.1 kg    Ideal Body Weight:  45.5 kg  BMI:  Body mass index is 22.86 kg/m.  Estimated Nutritional Needs:   Kcal:  1500-1700 kcal  Protein:  65-80 grams  Fluid:  >/= 1.7 L/day    Loistine Chance, RD, LDN, CDCES Registered Dietitian II Certified Diabetes Care and Education Specialist Please refer to [facility] for RD and/or RD on-call/weekend/after hours pager

## 2022-11-22 ENCOUNTER — Other Ambulatory Visit: Payer: Self-pay

## 2022-11-22 DIAGNOSIS — R197 Diarrhea, unspecified: Secondary | ICD-10-CM | POA: Diagnosis not present

## 2022-11-22 DIAGNOSIS — A419 Sepsis, unspecified organism: Secondary | ICD-10-CM | POA: Diagnosis not present

## 2022-11-22 DIAGNOSIS — E162 Hypoglycemia, unspecified: Secondary | ICD-10-CM

## 2022-11-22 DIAGNOSIS — G7101 Duchenne or Becker muscular dystrophy: Secondary | ICD-10-CM | POA: Diagnosis not present

## 2022-11-22 LAB — GLUCOSE, CAPILLARY
Glucose-Capillary: 127 mg/dL — ABNORMAL HIGH (ref 70–99)
Glucose-Capillary: 120 mg/dL — ABNORMAL HIGH (ref 70–99)
Glucose-Capillary: 176 mg/dL — ABNORMAL HIGH (ref 70–99)

## 2022-11-22 LAB — BASIC METABOLIC PANEL
Anion gap: 7 (ref 5–15)
BUN: <5 mg/dL — ABNORMAL LOW (ref 6–20)
CO2: 27 mmol/L (ref 22–32)
Calcium: 8.9 mg/dL (ref 8.9–10.3)
Chloride: 106 mmol/L (ref 98–111)
Creatinine, Ser: <0.3 mg/dL — ABNORMAL LOW (ref 0.61–1.24)
Glucose, Bld: 128 mg/dL — ABNORMAL HIGH (ref 70–99)
Potassium: 4.5 mmol/L (ref 3.5–5.1)
Sodium: 140 mmol/L (ref 135–145)

## 2022-11-22 LAB — CULTURE, BLOOD (ROUTINE X 2): Special Requests: ADEQUATE

## 2022-11-22 MED ORDER — AMOXICILLIN-POT CLAVULANATE 600-42.9 MG/5ML PO SUSR
Freq: Two times a day (BID) | ORAL | 0 refills | Status: DC
  Filled 2022-11-22: qty 75, 3d supply, fill #0

## 2022-11-22 MED ORDER — CEFDINIR 250 MG/5ML PO SUSR
300.0000 mg | Freq: Two times a day (BID) | ORAL | 0 refills | Status: AC
  Filled 2022-11-22: qty 60, 3d supply, fill #0

## 2022-11-22 MED ORDER — METRONIDAZOLE 500 MG PO TABS
500.0000 mg | ORAL_TABLET | Freq: Two times a day (BID) | ORAL | 0 refills | Status: AC
  Filled 2022-11-22: qty 5, 3d supply, fill #0

## 2022-11-22 NOTE — TOC Transition Note (Signed)
Transition of Care [facility]) - CM/SW Discharge Note   Patient Details  Name: Lucas [name] MRN: [id number] Date of Birth: [birth year]  [facility] CM/SW Contact:  Colen Darling, [place] Phone Number: 11/22/2022, 2:24 PM   Clinical Narrative:     TOC arranging transport home with [facility] private duty nurse. TOC can fax resume orders to [facility] at [phone].  Holley Bouche informed of plans at bedside.  TOC calling [facility].  Final next level of care: Other (comment) (private duty nursing with [facility] [phone])     Patient Goals and [place] private duty nurse  Discharge Placement                Patient to be transferred to facility by: [facility] Name of family member notified: Lasalle Abee Patient and family notified of of transfer: 11/22/22  Discharge Plan and Services                  DME Agency:  (bipap and nebulizer at home)                  Social Determinants of Health (SDOH) Interventions     Readmission Risk Interventions     No data to display

## 2022-11-22 NOTE — Telephone Encounter (Signed)
Signed form and notes faxed to [facility] through [employer]. Patient currently hospitalized for possible PNA and GI issues. Team updated

## 2022-11-22 NOTE — Progress Notes (Signed)
Patient OOF via stretcher in stable condition. Medications from pharmacy noted to be missing from room. Patient's mother called and she stated she had "come back and got them."

## 2022-11-22 NOTE — Progress Notes (Signed)
   11/22/22 1341  Medical Necessity for Transport Certificate --- IF THIS TRANSPORT IS ROUND TRIP OR SCHEDULED AND REPEATED, A PHYSICIAN MUST COMPLETE THIS FORM  Transport from: Teacher, English as a foreign language) Doctor, hospital to (Location) Other (Comment) ([address] [place], [place] [address])  Did the patient arrive from a [place], [place] or Group Home? No  Is this the closest appropriate facility? Yes  Date of Transport Service 11/22/22  Name of [facility]  Round Trip Transport? No  Reason for Transport Discharge  Is this a hospice patient? No  Describe the Medical Condition lobar pneumonia, autism spectrum disorder with accompanying language impairment and intellectual disability, duchenne muscular dystrophy, seizure, obstructive sleep apnea  Q1 Are ALL the following "true"? 1. Patient unable to get up from bed without assistance  AND  2. Unable to ambulate  AND  3. Unable to sit in a chair, including wheelchair. Yes  Q2 Could the patient be transported safely by other means of transportation (I.E., wheelchair van)? No  Q3 Please check any of the following conditions that apply at the time of transport: Risk of injury to self and/or others;Other (Comment);Seizure prone and requires monitoring (intellectual disability, duchenne muscular dystrophy, sleep apnea)  Electronic Signature Colen Darling  Credentials DP  Date Signed 11/22/22

## 2022-11-22 NOTE — Discharge Summary (Addendum)
Physician Discharge Summary   Patient: Lucas [name] MRN: [id number] DOB: March 26, 2001  Admit date:     11/19/2022  Discharge date: 11/22/22  Discharge Physician: Annita Brod   PCP: [name], MD   Recommendations at discharge:   New medication: Omnicef 300 mg (250 mg per 5 mL suspension) in the feeding tube tube times a day for 5 doses Flagyl 500 mg 3 times a day through feeding tube for 5 doses  Discharge Diagnoses: Active Problems:   Lobar pneumonia (HCC)   Autism spectrum disorder with accompanying language impairment and intellectual disability, requiring very substantial support   Duchenne muscular dystrophy (HCC)   Seizure (HCC)   OSA (obstructive sleep apnea)  Principal Problem (Resolved):   Sepsis ([place]) Resolved Problems:   Diarrhea   Hypokalemia   Hypoglycemia  Hospital Course: 21 year old with past medical history of Duchenne's muscular dystrophy seizure disorder and autism who presented to the emergency room on the morning 12/9 with several days of watery stools and low-grade fever and cough.  Presentation was similar March when patient had pneumonia.  Workup revealed sepsis secondary to possible pneumonia versus abdominal infection.  Assessment and Plan: * Sepsis (HCC)-resolved as of 11/22/2022 Secondary to pneumonia versus abdominal infection.  Criteria for sepsis on admission given fever, tachycardia and tachypnea with sources of either pneumonia versus abdominal infection with diarrhea.  Given normal procalcitonin, and more likely think this is from abdominal infection possibly colitis.  C. difficile and stool cultures are negative.  Continued on aggressive antibiotics and fluids.  1 positive blood culture for Staph epidermidis, likely contaminant.  By 12/12, patient acting more like himself heart rate still elevated although improved from on admission and in review of previous hospitalizations, patient's heart rate always is mildly tachycardic.   Discharged on 5 more doses of Flagyl and Omnicef to be given through his PEG tube for total of 5 days of therapy  Lobar pneumonia [facility]) Patient presents to the ER for evaluation of feeling unwell and is noted to be tachycardic.  He also has a nonproductive cough and a low-grade fever as well as diarrhea.  See above, given I think that this is may be more likely abdominal infection, will change antibiotics to cover both pneumonia and GI tract with cefepime and Flagyl.  Cough could possibly be to diaphragmatic irritation from colonic infection rather than true pneumonia.  Patient unable to produce sputum culture.  Diarrhea-resolved as of 11/22/2022 Unclear etiology.  Stool culture and C. difficile negative.  Not much output, but with fluid-filled colon seen on CT initially, resumed home MiraLAX with some output.  Hypokalemia-resolved as of 11/22/2022 Marked change with normal potassium on 12/9 then dropped to 2.6 on 12/10.  Multifactorial due to GI losses from diarrhea as well as fluid shifts from hypoglycemia.  Treating with potassium IV runs plus given that this is ongoing from diarrhea, have added potassium to IV fluids.  Autism spectrum disorder with accompanying language impairment and intellectual disability, requiring very substantial support Requires assistance with all ADL's  Hypoglycemia-resolved as of 11/22/2022 Noted on morning of 5/10.  IV fluids changed to D5 after patient given amp of D50.  No previous history of diabetes.  Not on any diabetic medications.  Secondary to sepsis and not getting tube feedings.  Resolved as infection treated  Duchenne muscular dystrophy ([place]) Continue supportive care  Seizure ([place]) Continue carbamazepine  OSA (obstructive sleep apnea) Continue BiPAP at bedtime         Consultants: None  Procedures performed: None Disposition: Home Diet recommendation:  Resume tube feedings DISCHARGE MEDICATION: Allergies as of 11/22/2022        Reactions   Penicillins Hives, Rash   Has patient had a PCN reaction causing immediate rash, facial/tongue/throat swelling, SOB or lightheadedness with hypotension: Yes Has patient had a PCN reaction causing severe rash involving mucus membranes or skin necrosis: Yes Has patient had a PCN reaction that required hospitalization: No Has patient had a PCN reaction occurring within the last 10 years: No If all of the above answers are "NO", then may proceed with Cephalosporin use.   Docusate Sodium    Rash and cant sleep   Multivitamins    Red in face, itchy, bumps   Other Other (See Comments)   [place] -- gets rash and cant sleep   Calcitonin Rash   Esomeprazole Magnesium Nausea And Vomiting   Omeprazole Other (See Comments)   Constipation and possible rash after drug was stopped   Simethicone Palpitations, Rash   Sunflower Oil Itching, Rash, Other (See Comments)   GI upset - Avoid products containing this additive   Vitamin D Analogs Other (See Comments)   Rash on face if it contains sunflower seed oil Excessive urine output and thirsty, increase in gas        Medication List     TAKE these medications    acetaminophen 160 MG/5ML solution Commonly known as: TYLENOL Take 10 mLs (320 mg total) by mouth every 6 (six) hours as needed for mild pain, moderate pain, fever or headache.   carbamazepine 100 MG chewable tablet Commonly known as: TEGRETOL TAKE 1 & 1/2 TABLETS BY MOUTH TWICE A DAY   cefdinir 250 MG/5ML suspension Commonly known as: OMNICEF Place 6 mLs (300 mg total) into feeding tube 2 (two) times daily for 5 doses. Discard remaining medication.   levalbuterol 1.25 MG/3ML nebulizer solution Commonly known as: XOPENEX Take 1.25 mg by nebulization every 4 (four) hours as needed.   metroNIDAZOLE 500 MG tablet Commonly known as: Flagyl Place 1 tablet (500 mg total) into feeding tube 2 (two) times daily for 5 doses.   nystatin powder Commonly known as:  MYCOSTATIN/NYSTOP Apply 1 Application topically 3 (three) times daily.   PediaSure Grow & Gain Liqd Drink 1 bottle spaced out 6 times a day What changed: additional instructions   polyethylene glycol powder 17 GM/SCOOP powder Commonly known as: GoodSense ClearLax Take 17 g by mouth 2 (two) times daily. With prune juice   prednisoLONE 15 MG/5ML solution Commonly known as: ORAPRED PLACE 3 ML INTO FEEDING TUBE DAILY        Discharge Exam: Filed Weights   11/19/22 0938 11/19/22 2320  Weight: 52.2 kg 53.1 kg   General: Awake and interactive Cardiovascular: Tachycardic  Condition at discharge: improving  The results of significant diagnostics from this hospitalization (including imaging, microbiology, ancillary and laboratory) are listed below for reference.   Imaging Studies: CT Angio Chest PE W/Cm &/Or Wo Cm  Result Date: 11/19/2022 CLINICAL DATA:  PE suspected, tachycardia, hypoxia, Duchenne muscular dystrophy EXAM: CT ANGIOGRAPHY CHEST WITH CONTRAST TECHNIQUE: Multidetector CT imaging of the chest was performed using the standard protocol during bolus administration of intravenous contrast. Multiplanar CT image reconstructions and MIPs were obtained to evaluate the vascular anatomy. RADIATION DOSE REDUCTION: This exam was performed according to the departmental dose-optimization program which includes automated exposure control, adjustment of the mA and/or kV according to patient size and/or use of iterative reconstruction technique. CONTRAST:  62mL  OMNIPAQUE IOHEXOL 350 MG/ML SOLN COMPARISON:  03/03/2022 FINDINGS: Cardiovascular: Satisfactory opacification of the pulmonary arteries to the segmental level. No evidence of pulmonary embolism. Normal heart size. No pericardial effusion. Mediastinum/Nodes: No enlarged mediastinal, hilar, or axillary lymph nodes. Thyroid gland, trachea, and esophagus demonstrate no significant findings. Lungs/Pleura: Dependent left basilar atelectasis or  consolidation. Upper Abdomen: No acute abnormality. Musculoskeletal: No chest wall abnormality. No acute osseous findings. Dextroscoliosis of the thoracolumbar spine with partially imaged posterior rod fusion. Review of the MIP images confirms the above findings. IMPRESSION: 1. Negative examination for pulmonary embolism. 2. Dependent left basilar atelectasis or consolidation. Electronically Signed   By: Delanna Ahmadi M.D.   On: 11/19/2022 14:23   CT Abdomen Pelvis W Contrast  Result Date: 11/19/2022 CLINICAL DATA:  Abdominal pain EXAM: CT ABDOMEN AND PELVIS WITH CONTRAST TECHNIQUE: Multidetector CT imaging of the abdomen and pelvis was performed using the standard protocol following bolus administration of intravenous contrast. RADIATION DOSE REDUCTION: This exam was performed according to the departmental dose-optimization program which includes automated exposure control, adjustment of the mA and/or kV according to patient size and/or use of iterative reconstruction technique. CONTRAST:  39mL OMNIPAQUE IOHEXOL 350 MG/ML SOLN COMPARISON:  03/03/2022 FINDINGS: Lower chest: Please see separately reported abdominal pain. Hepatobiliary: No solid liver abnormality is seen. No gallstones, gallbladder wall thickening, or biliary dilatation. Pancreas: Unremarkable. No pancreatic ductal dilatation or surrounding inflammatory changes. Spleen: Normal in size without significant abnormality. Adrenals/Urinary Tract: Adrenal glands are unremarkable. Kidneys are normal, without renal calculi, solid lesion, or hydronephrosis. Bladder is unremarkable. Stomach/Bowel: Percutaneous gastrostomy tube. Appendix appears normal. No evidence of bowel wall thickening, distention, or inflammatory changes. Colon is fluid-filled to the rectum. Vascular/Lymphatic: No significant vascular findings are present. No enlarged abdominal or pelvic lymph nodes. Reproductive: No mass or other significant abnormality. Other: No abdominal wall hernia  or abnormality. No ascites. Musculoskeletal: No acute or significant osseous findings. Dextroscoliosis of the thoracolumbar spine with partially imaged posterior rod fusion. IMPRESSION: 1. No acute CT findings of the abdomen or pelvis to explain abdominal pain. 2. Colon is fluid-filled to the rectum, consistent with diarrheal illness. Electronically Signed   By: Delanna Ahmadi M.D.   On: 11/19/2022 14:08   DG Chest Port 1 View  Result Date: 11/19/2022 CLINICAL DATA:  Fever and cough EXAM: PORTABLE CHEST 1 VIEW COMPARISON:  Chest radiograph dated 03/03/2022 FINDINGS: Similar low lung volumes. No focal consolidations. No pleural effusion or pneumothorax. Enlarged cardiomediastinal silhouette is likely projectional. Partially imaged spinal fixation hardware appears intact. Diffuse gas-filled bowel loops. IMPRESSION: No active disease. Electronically Signed   By: Darrin Nipper M.D.   On: 11/19/2022 10:07    Microbiology: Results for orders placed or performed during the hospital encounter of 11/19/22  Resp panel by RT-PCR (RSV, Flu A&B, Covid) Anterior Nasal Swab     Status: None   Collection Time: 11/19/22  9:41 AM   Specimen: Anterior Nasal Swab  Result Value Ref Range Status   SARS Coronavirus 2 by RT PCR NEGATIVE NEGATIVE Final    Comment: (NOTE) SARS-CoV-2 target nucleic acids are NOT DETECTED.  The SARS-CoV-2 RNA is generally detectable in upper respiratory specimens during the acute phase of infection. The lowest concentration of SARS-CoV-2 viral copies this assay can detect is 138 copies/mL. A negative result does not preclude SARS-Cov-2 infection and should not be used as the sole basis for treatment or other patient management decisions. A negative result may occur with  improper specimen collection/handling, submission of  specimen other than nasopharyngeal swab, presence of viral mutation(s) within the areas targeted by this assay, and inadequate number of viral copies(<138 copies/mL). A  negative result must be combined with clinical observations, patient history, and epidemiological information. The expected result is Negative.  Fact Sheet for Patients:  EntrepreneurPulse.com.au  Fact Sheet for Healthcare Providers:  IncredibleEmployment.be  This test is no t yet approved or cleared by the [place] FDA and  has been authorized for detection and/or diagnosis of SARS-CoV-2 by FDA under an Emergency Use Authorization (EUA). This EUA will remain  in effect (meaning this test can be used) for the duration of the COVID-19 declaration under Section 564(b)(1) of the Act, 21 U.S.C.section 360bbb-3(b)(1), unless the authorization is terminated  or revoked sooner.       Influenza A by PCR NEGATIVE NEGATIVE Final   Influenza B by PCR NEGATIVE NEGATIVE Final    Comment: (NOTE) The Xpert Xpress SARS-CoV-2/FLU/RSV plus assay is intended as an aid in the diagnosis of influenza from Nasopharyngeal swab specimens and should not be used as a sole basis for treatment. Nasal washings and aspirates are unacceptable for Xpert Xpress SARS-CoV-2/FLU/RSV testing.  Fact Sheet for Patients: EntrepreneurPulse.com.au  Fact Sheet for Healthcare Providers: IncredibleEmployment.be  This test is not yet approved or cleared by the [place] FDA and has been authorized for detection and/or diagnosis of SARS-CoV-2 by FDA under an Emergency Use Authorization (EUA). This EUA will remain in effect (meaning this test can be used) for the duration of the COVID-19 declaration under Section 564(b)(1) of the Act, 21 U.S.C. section 360bbb-3(b)(1), unless the authorization is terminated or revoked.     Resp Syncytial Virus by PCR NEGATIVE NEGATIVE Final    Comment: (NOTE) Fact Sheet for Patients: EntrepreneurPulse.com.au  Fact Sheet for Healthcare  Providers: IncredibleEmployment.be  This test is not yet approved or cleared by the [place] FDA and has been authorized for detection and/or diagnosis of SARS-CoV-2 by FDA under an Emergency Use Authorization (EUA). This EUA will remain in effect (meaning this test can be used) for the duration of the COVID-19 declaration under Section 564(b)(1) of the Act, 21 U.S.C. section 360bbb-3(b)(1), unless the authorization is terminated or revoked.  Performed at [facility], [place], [place] [address]   Respiratory (~20 pathogens) panel by PCR     Status: None   Collection Time: 11/19/22 12:31 PM  Result Value Ref Range Status   Adenovirus NOT DETECTED NOT DETECTED Final   Coronavirus 229E NOT DETECTED NOT DETECTED Final    Comment: (NOTE) The Coronavirus on the Respiratory Panel, DOES NOT test for the novel  Coronavirus (2019 nCoV)    Coronavirus HKU1 NOT DETECTED NOT DETECTED Final   Coronavirus NL63 NOT DETECTED NOT DETECTED Final   Coronavirus OC43 NOT DETECTED NOT DETECTED Final   Metapneumovirus NOT DETECTED NOT DETECTED Final   Rhinovirus / Enterovirus NOT DETECTED NOT DETECTED Final   Influenza A NOT DETECTED NOT DETECTED Final   Influenza B NOT DETECTED NOT DETECTED Final   Parainfluenza Virus 1 NOT DETECTED NOT DETECTED Final   Parainfluenza Virus 2 NOT DETECTED NOT DETECTED Final   Parainfluenza Virus 3 NOT DETECTED NOT DETECTED Final   Parainfluenza Virus 4 NOT DETECTED NOT DETECTED Final   Respiratory Syncytial Virus NOT DETECTED NOT DETECTED Final   Bordetella pertussis NOT DETECTED NOT DETECTED Final   Bordetella Parapertussis NOT DETECTED NOT DETECTED Final   Chlamydophila pneumoniae NOT DETECTED NOT DETECTED Final   Mycoplasma  pneumoniae NOT DETECTED NOT DETECTED Final    Comment: Performed at [facility], [place] [address] [place], [place] [address]  Culture, blood (routine x 2)     Status: Abnormal    Collection Time: 11/19/22  2:59 PM   Specimen: BLOOD LEFT ARM  Result Value Ref Range Status   Specimen Description   Final    BLOOD LEFT ARM Performed at [facility], [address], [place], [place] [address]    Special Requests   Final    BOTTLES DRAWN AEROBIC AND ANAEROBIC Blood Culture adequate volume Performed at [facility], [address] [place], [place] [address]    Culture  Setup Time   Final    Organism ID to follow [place] ONLY CRITICAL RESULT CALLED TO, READ BACK BY AND VERIFIED WITH: ALEX CHAPPELL AT 1137 11/20/22.PMF Performed at [facility], [address] [place], [place] [address]    Culture (A)  Final    STAPHYLOCOCCUS EPIDERMIDIS THE SIGNIFICANCE OF ISOLATING THIS ORGANISM FROM A SINGLE SET OF BLOOD CULTURES WHEN MULTIPLE SETS ARE DRAWN IS UNCERTAIN. PLEASE NOTIFY THE MICROBIOLOGY DEPARTMENT WITHIN ONE WEEK IF SPECIATION AND SENSITIVITIES ARE REQUIRED. Performed at [facility], [place] [address], [place], [place] [address]    Report Status 11/22/2022 FINAL  Final  Culture, blood (routine x 2)     Status: None (Preliminary result)   Collection Time: 11/19/22  2:59 PM   Specimen: BLOOD LEFT ARM  Result Value Ref Range Status   Specimen Description BLOOD LEFT ARM  Final   Special Requests   Final    BOTTLES DRAWN AEROBIC AND ANAEROBIC Blood Culture adequate volume   Culture   Final    NO GROWTH 2 DAYS Performed at [facility], [address] [place],  [address]    Report Status PENDING  Incomplete  Blood Culture ID Panel (Reflexed)     Status: Abnormal   Collection Time: 11/19/22  2:59 PM  Result Value Ref Range Status   Enterococcus faecalis NOT DETECTED NOT DETECTED Final   Enterococcus Faecium NOT DETECTED NOT DETECTED Final   Listeria monocytogenes NOT DETECTED NOT DETECTED Final   Staphylococcus species DETECTED (A) NOT DETECTED Final    Comment: CRITICAL RESULT  CALLED TO, READ BACK BY AND VERIFIED WITH: ALEX CHAPPELL AT 1137 11/20/22.PMF    Staphylococcus aureus (BCID) NOT DETECTED NOT DETECTED Final   Staphylococcus epidermidis DETECTED (A) NOT DETECTED Final    Comment: CRITICAL RESULT CALLED TO, READ BACK BY AND VERIFIED WITH: ALEX CHAPPELL AT 7408 11/20/22.PMF    Staphylococcus lugdunensis NOT DETECTED NOT DETECTED Final   Streptococcus species NOT DETECTED NOT DETECTED Final   Streptococcus agalactiae NOT DETECTED NOT DETECTED Final   Streptococcus pneumoniae NOT DETECTED NOT DETECTED Final   Streptococcus pyogenes NOT DETECTED NOT DETECTED Final   A.calcoaceticus-baumannii NOT DETECTED NOT DETECTED Final   Bacteroides fragilis NOT DETECTED NOT DETECTED Final   Enterobacterales NOT DETECTED NOT DETECTED Final   Enterobacter cloacae complex NOT DETECTED NOT DETECTED Final   Escherichia coli NOT DETECTED NOT DETECTED Final   Klebsiella aerogenes NOT DETECTED NOT DETECTED Final   Klebsiella oxytoca NOT DETECTED NOT DETECTED Final   Klebsiella pneumoniae NOT DETECTED NOT DETECTED Final   Proteus species NOT DETECTED NOT DETECTED Final   Salmonella species NOT DETECTED NOT DETECTED Final   Serratia marcescens NOT DETECTED NOT DETECTED Final   Haemophilus influenzae NOT DETECTED NOT DETECTED Final  Neisseria meningitidis NOT DETECTED NOT DETECTED Final   Pseudomonas aeruginosa NOT DETECTED NOT DETECTED Final   Stenotrophomonas maltophilia NOT DETECTED NOT DETECTED Final   Candida albicans NOT DETECTED NOT DETECTED Final   Candida auris NOT DETECTED NOT DETECTED Final   Candida glabrata NOT DETECTED NOT DETECTED Final   Candida krusei NOT DETECTED NOT DETECTED Final   Candida parapsilosis NOT DETECTED NOT DETECTED Final   Candida tropicalis NOT DETECTED NOT DETECTED Final   Cryptococcus neoformans/gattii NOT DETECTED NOT DETECTED Final   Methicillin resistance mecA/C NOT DETECTED NOT DETECTED Final    Comment: Performed at [facility], [address] [place], [place] [address]  Gastrointestinal Panel by PCR , Stool     Status: None   Collection Time: 11/19/22  3:26 PM   Specimen: Stool  Result Value Ref Range Status   Campylobacter species NOT DETECTED NOT DETECTED Final   Plesimonas shigelloides NOT DETECTED NOT DETECTED Final   Salmonella species NOT DETECTED NOT DETECTED Final   Yersinia enterocolitica NOT DETECTED NOT DETECTED Final   Vibrio species NOT DETECTED NOT DETECTED Final   Vibrio cholerae NOT DETECTED NOT DETECTED Final   Enteroaggregative E coli (EAEC) NOT DETECTED NOT DETECTED Final   Enteropathogenic E coli (EPEC) NOT DETECTED NOT DETECTED Final   Enterotoxigenic E coli (ETEC) NOT DETECTED NOT DETECTED Final   Shiga like toxin producing E coli (STEC) NOT DETECTED NOT DETECTED Final   Shigella/Enteroinvasive E coli (EIEC) NOT DETECTED NOT DETECTED Final   Cryptosporidium NOT DETECTED NOT DETECTED Final   Cyclospora cayetanensis NOT DETECTED NOT DETECTED Final   Entamoeba histolytica NOT DETECTED NOT DETECTED Final   Giardia lamblia NOT DETECTED NOT DETECTED Final   Adenovirus F40/41 NOT DETECTED NOT DETECTED Final   Astrovirus NOT DETECTED NOT DETECTED Final   Norovirus GI/GII NOT DETECTED NOT DETECTED Final   Rotavirus A NOT DETECTED NOT DETECTED Final   Sapovirus (I, II, IV, and V) NOT DETECTED NOT DETECTED Final    Comment: Performed at [facility], [address] [place], [place] [address]  C Difficile Quick Screen w PCR reflex     Status: None   Collection Time: 11/19/22  3:27 PM   Specimen: Stool  Result Value Ref Range Status   C Diff antigen NEGATIVE NEGATIVE Final   C Diff toxin NEGATIVE NEGATIVE Final   C Diff interpretation No C. difficile detected.  Final    Comment: Performed at [facility], [address] [place], [place] [address]    Labs: CBC: Recent Labs  Lab 11/19/22 0941 11/20/22 0544  WBC 9.5 10.1  NEUTROABS 8.1*  --   HGB 15.1  13.0  HCT 45.0 38.8*  MCV 88.9 89.6  PLT 193 962   Basic Metabolic Panel: Recent Labs  Lab 11/19/22 0941 11/20/22 0540 11/20/22 0544 11/21/22 0438 11/22/22 0329  NA 139  --  139 136 140  K 4.4  --  2.6* 3.1* 4.5  CL 105  --  106 102 106  CO2 26  --  $R'23 23 27  'uL$ GLUCOSE 128*  --  64* 142* 128*  BUN 7  --  5* <5* <5*  CREATININE <0.30*  --  <0.30* <0.30* <0.30*  CALCIUM 9.4  --  8.7* 8.7* 8.9  MG  --  1.8  --   --   --    Liver Function Tests: No results for input(s): "AST", "ALT", "ALKPHOS", "BILITOT", "PROT", "ALBUMIN" in the last 168 hours. CBG: Recent  Labs  Lab 11/21/22 2029 11/21/22 2340 11/22/22 0516 11/22/22 0737 11/22/22 1145  GLUCAP 148* 193* 127* 120* 176*    Discharge time spent: less than 30 minutes.  Signed: Annita Brod, MD [facility] 11/22/2022

## 2022-11-22 NOTE — Progress Notes (Signed)
Patient is waiting for EMS. Rectal tube and IV line removed.

## 2022-11-22 NOTE — TOC Progression Note (Signed)
Transition of Care [facility]) - Progression Note    Patient Details  Name: Lucas [name] MRN: [id number] Date of Birth: 2001/08/08  Transition of Care [facility]) CM/SW [place], [place] Phone Number: 11/22/2022, 2:14 PM  Clinical Narrative:     TOC spoke to the patient's family and the patient has [facility] [phone]. TOC can speak with [facility].  Family member will transport home with [employer] taxi. TOC calling [facility] and arranging EMS transport home for the patient.       Expected Discharge Plan and Services      [facility]     Expected Discharge Date: 11/22/22                                     Social Determinants of Health (SDOH) Interventions    Readmission Risk Interventions     No data to display

## 2022-11-22 NOTE — Progress Notes (Signed)
Patient awaiting EMS transport.

## 2022-11-22 NOTE — Progress Notes (Signed)
Patient's mother updated via phone..still waiting for EMS pick up

## 2022-11-22 NOTE — Progress Notes (Signed)
   11/22/22 0515  Vitals  Temp 98.9 F (37.2 C)  Temp Source Oral  BP 118/89  MAP (mmHg) 96  BP Location Right Arm  BP Method Automatic  Patient Position (if appropriate) Lying  Pulse Rate (!) 111  Pulse Rate Source Monitor  Resp 18  Level of Consciousness  Level of Consciousness Alert  MEWS COLOR  MEWS Score Color Yellow  Oxygen Therapy  SpO2 100 %  O2 Device Room Air  Pain Assessment  Pain Scale PAINAD  PAINAD (Pain Assessment in Advanced Dementia)  Breathing 0  Negative Vocalization 0  Facial Expression 0  Body Language 0  Consolability 0  PAINAD Score 0  MEWS Score  MEWS Temp 0  MEWS Systolic 0  MEWS Pulse 2  MEWS RR 0  MEWS LOC 0  MEWS Score 2  Provider Notification  Provider Name/Title Neomia Glass, NP  Date Provider Notified 11/22/22  Time Provider Notified [phone]  Method of Notification Call (Secure chat)  Notification Reason Change in status (Increased HR)  Provider response No new orders  Date of Provider Response 11/22/22  Time of Provider Response [phone]

## 2022-11-23 NOTE — Progress Notes (Signed)
Patient: BACILIO ABASCAL MRN: [id number] Sex: male DOB: 2001/08/07  Provider: [name], MD Location of Care: Pediatric Specialist- Pediatric Complex Care Note type: Routine return visit  History of Present Illness: Referral Source: Collene Schlichter, MD  History from: patient and prior records Chief Complaint: Pediatric Complex Care   ZHANE BLUITT is a 21 y.o. male with history of  Duchenne Muscular Dystrophy, autism spectrum disorder, level 3, restrictive lung disease, compensated congestive heart failure, severe dysphagia requiring tube feeding, and obstructive sleep apnea who I am seeing in follow-up for complex care management. Patient was last seen 08/25/22 where I continued all other medications.  Since that appointment, patient has been admitted on 11/19/22 for pneumonia.   Patient presents today with his mother and his home health nurse. They report the following.  Symptom management:  No seizures since the last visit. Continues to take his tegretol.   She continues his Miralax BID now. Constipation is much improved, if he does have any then it is much resolved with an enema.   Reflux is much improved. Only vomits when he was sick or if he gets feeds too quick. When they tried to increase his free water to 120 mL and he got sick with this but he was also getting sick so it could have been that.   They report he has a bruise on his knee that they are concerned about. Report he hit his legs in the hospital and he may have irritated him putting his pants on this morning.   Care coordination (other providers): There have been many messages about managing his Reflux, had increased heart rate with pantoprazole, GI recommended switched to pepcid, however mom reports he previously had a reaction to this. GI noted that in order to try other medications, he would need an endoscopy, mom states "she will not do an EGD and he just will not be on any medication."   Dr. Wonda Amis notes she  received paperwork for formula with [employer] but cannot complete it without a visit. In addition, Jake, patients nurse also reported to them that he is gaining excess weight. Wonders about decreasing feeds, they note they cannot give advice without a visit.    He was scheduled and missed appointments with Dr. Wonda Amis on 10/9, 10/31, 11/6, and 11/7.  He was seen by Dr. Madelin Rear on 10/17/22 where they recommended decreasing to 5 feeds a day until he is able to be seen by GI. He then saw Dr. Wonda Amis 11/09/22 where they reduced it to 4 feeds a day and increased his water intake.   Mom reports that she will now bee seeing the nutritionist at [facility] as well on 12/28. She reports that she would want to discuss his water intake.    He also saw Dr. Damita Lack for pulmonology on 11/11/22 where they discussed his need for a new pulse ox, continued Bpap at night and cough assist and vest BID. He also continued Xopenex.     Care management needs:  We wrote a letter requesting more nursing hours and sent to [employer] who agreed to upload it to insurance.   They have now gotten new AC and heating in the home. They are working on roof and window improvements they have turned in approval for that.    Equipment needs:  At last visit, placed an order for new pulse ox an feeding supplies. Also advised mom to call [employer] to fix or replace his chest vest. I also re ordered  hand splints.He got a lage chest vest from [employer] and this was too big and they medium is too small. Mom also reports that she has been working on getting him a cough assist and pulse ox.    Past Medical History Past Medical History:  Diagnosis Date   Autism    Autism    Community acquired pneumonia of left lower lobe of lung 03/03/2022   Diarrhea 06/09/2015   Family history of adverse reaction to anesthesia    MGGM- N/V   Muscular dystrophy ([place])    Scoliosis    Seizures ([place])    Last one 2013   Sepsis ([place]) 03/03/2022   Transient alteration of  awareness    Viral gastroenteritis    Yeast infection of the skin 10/24/2020    Surgical History Past Surgical History:  Procedure Laterality Date   CIRCUMCISION     at birth   LAPAROSCOPIC GASTROSTOMY N/A 05/24/2017   Procedure: LAPAROSCOPIC GASTROSTOMY TUBE PLACEMENT;  Surgeon: Stanford Scotland, MD;  Location: [facility];  Service: General;  Laterality: N/A;   SPINE SURGERY N/A    Phreesia 06/29/2020    Family History family history includes Alcohol abuse in his father; Asthma in an other family member; Cancer in his paternal grandfather; Dementia in his paternal grandmother; Heart failure in his maternal grandmother; Hyperlipidemia in his mother and another family member; Hypertension in his maternal grandmother and another family member; Learning disabilities in his mother; Other in his maternal aunt, maternal grandmother, maternal uncle, and mother; Vision loss in an other family member.   Social History Social History   Social History Narrative   He lives with mother.   He graduated high school in 22.    He is not currently in a day program    He currently is in PT (at home) mom upset they are going to lose this service when he turns 21.     Allergies Allergies  Allergen Reactions   Penicillins Hives and Rash    Has patient had a PCN reaction causing immediate rash, facial/tongue/throat swelling, SOB or lightheadedness with hypotension: Yes Has patient had a PCN reaction causing severe rash involving mucus membranes or skin necrosis: Yes Has patient had a PCN reaction that required hospitalization: No Has patient had a PCN reaction occurring within the last 10 years: No If all of the above answers are "NO", then may proceed with Cephalosporin use.    Docusate Sodium     Rash and cant sleep   Multivitamins     Red in face, itchy, bumps   Other Other (See Comments)    Senkot -- gets rash and cant sleep   Calcitonin Rash   Esomeprazole Magnesium Nausea And Vomiting    Omeprazole Other (See Comments)    Constipation and possible rash after drug was stopped   Simethicone Palpitations and Rash   Sunflower Oil Itching, Rash and Other (See Comments)    GI upset - Avoid products containing this additive   Vitamin D Analogs Other (See Comments)    Rash on face if it contains sunflower seed oil Excessive urine output and thirsty, increase in gas    Medications Current Outpatient Medications on File Prior to Visit  Medication Sig Dispense Refill   acetaminophen (TYLENOL) 160 MG/5ML solution Take 10 mLs (320 mg total) by mouth every 6 (six) hours as needed for mild pain, moderate pain, fever or headache. 473 mL 1   levalbuterol (XOPENEX) 1.25 MG/3ML nebulizer solution Take 1.25 mg  by nebulization every 4 (four) hours as needed.     nystatin (MYCOSTATIN/NYSTOP) powder Apply 1 Application topically 3 (three) times daily.     polyethylene glycol powder (GOODSENSE CLEARLAX) 17 GM/SCOOP powder Take 17 g by mouth 2 (two) times daily. With prune juice 3060 g 3   Nutritional Supplements (PEDIASURE GROW & GAIN) LIQD Drink 1 bottle spaced out 6 times a day (Patient taking differently: Drink 1 bottle spaced out 5 times a day) 42660 mL 12   No current facility-administered medications on file prior to visit.   The medication list was reviewed and reconciled. All changes or newly prescribed medications were explained.  A complete medication list was provided to the patient/caregiver.  Physical Exam BP 120/84 (BP Location: Left Arm, Patient Position: Sitting, Cuff Size: Normal)   Pulse (!) 120   Ht 4' 11.97" (1.523 m)   Wt 116 lb (52.6 kg)   BMI 22.68 kg/m  Weight for age: Facility age limit for growth %iles is 20 years.  Length for age: Facility age limit for growth %iles is 20 years. BMI: Body mass index is 22.68 kg/m. No results found. Gen: well appearing neuroaffected young man Skin: No rash, No neurocutaneous stigmata. HEENT: Normocephalic, no dysmorphic  features, no conjunctival injection, nares patent, mucous membranes moist, oropharynx clear.  Neck: Supple, no meningismus. No focal tenderness. Resp: Clear to auscultation bilaterally CV: Regular rate, normal S1/S2, no murmurs, no rubs Abd: BS present, abdomen soft, non-tender, non-distended. No hepatosplenomegaly or mass Ext: Bruising along both hands, mild swelling of L hand. Left knee with healing bruise.  Mild swelling and tenderness along medial aspect of left knee.  No tendon or boney abnormality felt. Warm and well-perfused. Contractures throughout with limited ROM.   Neurological Examination: MS: Awake, alert.  Nonverbal, voices "boo boo" appropriately.   Cranial Nerves: Pupils were equal and reactive to light;  No clear visual field defect, no nystagmus; no ptsosis, face symmetric with full strength of facial muscles, hearing grossly intact.  Motor-Low core tone, low extremity tone.  Moves arms 3/5, limited movement of legs 1/5.  Reflexes- Not checked today.  Sensation: Responds to touch in all extremities.  Coordination: Does not reach for objects.  Gait: wheelchair dependent.    Diagnosis:  1. Duchenne muscular dystrophy ([place])   2. Partial epilepsy with impairment of consciousness   3. Obstructive sleep apnea syndrome   4. Complex care coordination   5. Gastrostomy tube dependent ([place])   6. Contracture of multiple joints      Assessment and Plan KYREL LEIGHTON is a 21 y.o. male with history of  Duchenne Muscular Dystrophy, autism spectrum disorder, level 3, restrictive lung disease, compensated congestive heart failure, severe dysphagia requiring tube feeding, and obstructive sleep apnea who presents for follow-up in the pediatric complex care clinic.  Patient seen by case manager, dietician, integrated behavioral health today as well, please see accompanying notes.  I discussed case with all involved parties for coordination of care and recommend patient follow their  instructions as below.   Symptom management:  Seizures are well managed on current medication regimen. Continued this today. If he were to have any events, would plan for EEG as it has been so long since his last seizure. I also discussed new problem of bruising on his knee with mom and nurse. Seems to be muscular pain from swelling for which I recommended 500 mg tylenol PRN every 6 hours, especially before transfers for the next 3 days.  If  this does not improved, asked the family to call and I plan to order an x-ray to rule out broken bones.   - Continued carbamazepine today - Gave nursing orders for tylenol for knee pain - Refilled prednisolone for 90 day supply per mother's request  Care coordination: - Discussed continuing to follow up with GI PRN to manage constipation and his g-tube. Mom agrees. Will also plan to keep apt on 28th with RD to discuss increasing free water.   Care management needs - Will follow up with [employer] to ensure he has the right size chest vest, and to ask for training for mom on how to put it on.  - Patient continues to need private duty nursing.   Equipment needs:  - Agreed to ask sarah to follow up on pulse ox order from previous visit with Dr. Sasakwa Cellar.  - Due to patient's medical condition, patient is indefinitely incontinent of stool and urine. It is medically necessary for them to use diapers, underpads, and gloves to assist with hygiene and skin integrity.  They require a frequency of up to 200 a month.   Decision making/Advanced care planning: - Not addressed at this visit, patient remains at full code.    The CARE PLAN for reviewed and revised to represent the changes above.  This is available in Epic under snapshot, and a physical binder provided to the patient, that can be used for anyone providing care for the patient.   I spent 45 minutes on day of service on this patient including review of chart, discussion with patient and family, discussion of  screening results, coordination with other providers and management of orders and paperwork.     Return in about 6 months (around 05/26/2023).  I, Scharlene Gloss, scribed for and in the presence of Carylon Perches, MD at today's visit on 11/24/2022.   I, Carylon Perches MD MPH, personally performed the services described in this documentation, as scribed by Scharlene Gloss in my presence on 11/24/2022 and it is accurate, complete, and reviewed by me.    Carylon Perches MD MPH Neurology,  Neurodevelopment and Neuropalliative care [facility] Child Neurology  [address] [place], [place], [place] [address] Phone: [phone] Fax: [phone]

## 2022-11-24 ENCOUNTER — Telehealth (INDEPENDENT_AMBULATORY_CARE_PROVIDER_SITE_OTHER): Payer: Self-pay

## 2022-11-24 ENCOUNTER — Encounter (INDEPENDENT_AMBULATORY_CARE_PROVIDER_SITE_OTHER): Payer: Self-pay | Admitting: Pediatrics

## 2022-11-24 ENCOUNTER — Ambulatory Visit (INDEPENDENT_AMBULATORY_CARE_PROVIDER_SITE_OTHER): Payer: Medicaid Other | Admitting: Pediatrics

## 2022-11-24 ENCOUNTER — Ambulatory Visit (INDEPENDENT_AMBULATORY_CARE_PROVIDER_SITE_OTHER): Payer: Self-pay | Admitting: Pediatrics

## 2022-11-24 VITALS — BP 120/84 | HR 120 | Ht 59.97 in | Wt 116.0 lb

## 2022-11-24 DIAGNOSIS — G40109 Localization-related (focal) (partial) symptomatic epilepsy and epileptic syndromes with simple partial seizures, not intractable, without status epilepticus: Secondary | ICD-10-CM | POA: Diagnosis not present

## 2022-11-24 DIAGNOSIS — F840 Autistic disorder: Secondary | ICD-10-CM

## 2022-11-24 DIAGNOSIS — G40209 Localization-related (focal) (partial) symptomatic epilepsy and epileptic syndromes with complex partial seizures, not intractable, without status epilepticus: Secondary | ICD-10-CM

## 2022-11-24 DIAGNOSIS — G7101 Duchenne or Becker muscular dystrophy: Secondary | ICD-10-CM

## 2022-11-24 DIAGNOSIS — J984 Other disorders of lung: Secondary | ICD-10-CM

## 2022-11-24 DIAGNOSIS — Z931 Gastrostomy status: Secondary | ICD-10-CM

## 2022-11-24 DIAGNOSIS — G4733 Obstructive sleep apnea (adult) (pediatric): Secondary | ICD-10-CM | POA: Diagnosis not present

## 2022-11-24 DIAGNOSIS — S8992XA Unspecified injury of left lower leg, initial encounter: Secondary | ICD-10-CM

## 2022-11-24 DIAGNOSIS — I509 Heart failure, unspecified: Secondary | ICD-10-CM

## 2022-11-24 DIAGNOSIS — M2450 Contracture, unspecified joint: Secondary | ICD-10-CM

## 2022-11-24 DIAGNOSIS — R1310 Dysphagia, unspecified: Secondary | ICD-10-CM

## 2022-11-24 DIAGNOSIS — Z7189 Other specified counseling: Secondary | ICD-10-CM

## 2022-11-24 LAB — CULTURE, BLOOD (ROUTINE X 2)
Culture: NO GROWTH
Special Requests: ADEQUATE

## 2022-11-24 NOTE — Telephone Encounter (Signed)
Call to [employer] related to pulse ox request sent 11/11/22. She does not see the order. She reports the pulse ox is not a year old and is not eligible for a new one at this time. She will enter a work order and have a tech go to the home to check it. She does not see the order in [employer] in Sept about pulse ox but may have been cancelled due to not being eligible for a new order. Address verified phone number they had on file is not correct updated phone number.    Notes related to pulse ox were faxed on 11/30 from Dr. Shelby Mattocks visit- can be seen in HIM Release EPIC.

## 2022-11-24 NOTE — Patient Instructions (Addendum)
Give him 500 mg tylenol PRN via tube every 6 hours. You can give this 30 min to 60 min before transfers.  Call me if it is not getting better.  I refilled his prednisone and carbamazepine today.  Keep his appointment on the 28th with the nutritionist.  We will call [employer] and tell them to come make sure his vest is fitting right. Judson Roch is checking on the pusle ox.

## 2022-11-28 ENCOUNTER — Encounter (INDEPENDENT_AMBULATORY_CARE_PROVIDER_SITE_OTHER): Payer: Self-pay | Admitting: Pediatrics

## 2022-11-28 MED ORDER — PREDNISOLONE SODIUM PHOSPHATE 15 MG/5ML PO SOLN: ORAL | 5 refills | Status: DC

## 2022-11-28 MED ORDER — CARBAMAZEPINE 100 MG PO CHEW: CHEWABLE_TABLET | ORAL | 5 refills | Status: DC

## 2022-11-30 ENCOUNTER — Telehealth (INDEPENDENT_AMBULATORY_CARE_PROVIDER_SITE_OTHER): Payer: Self-pay | Admitting: Pediatrics

## 2022-11-30 DIAGNOSIS — M25462 Effusion, left knee: Secondary | ICD-10-CM

## 2022-11-30 NOTE — Telephone Encounter (Signed)
Contacted pt's mother.  Mom states that the problem with his knee is still going on.   Mom would like to have the X-Ray scheduled as discussed at the last visit.   SS, CCMA

## 2022-11-30 NOTE — Telephone Encounter (Deleted)
.  pst

## 2022-11-30 NOTE — Telephone Encounter (Signed)
  Name of who is calling:  Caller's Relationship to Patient:  Best contact number:[phone]  Provider they see:[name]   Reason for call:Caller req a call back to see about orders being put in for a x-ray on Lucas [name]'s leg. Caller stated that it is getting worse with new swelling.      PRESCRIPTION REFILL ONLY  Name of prescription:  Pharmacy:

## 2022-12-01 NOTE — Telephone Encounter (Signed)
The xray is ordered. Please let Mom know that she can take him to have the x-ray performed at [facility]. Thanks, Otila Kluver

## 2022-12-01 NOTE — Telephone Encounter (Signed)
Attempted to contact pt's mom. Unable to be reached. LVM to call us back.   SS, CCMA

## 2022-12-01 NOTE — Telephone Encounter (Signed)
Contacted pt's mother. Verified pt's name and DOB as well as mothers name.   I relayed previous message to mom.  Mom verbalized understanding of this.   SS, CCMA

## 2022-12-01 NOTE — Telephone Encounter (Signed)
Please verify which knee - right or left - so that I can order the x-ray. Thanks, Otila Kluver

## 2022-12-01 NOTE — Telephone Encounter (Signed)
  Name of who is calling:  Caller's Relationship to Patient:mother   Best contact number:[phone]  Provider they see:[name]   Reason for call:caller stated that the X-Ray will be needed for the left leg. Caller asked for a call back once ordered if possible.      PRESCRIPTION REFILL ONLY  Name of prescription:  Pharmacy:

## 2022-12-02 ENCOUNTER — Ambulatory Visit
Admission: RE | Admit: 2022-12-02 | Discharge: 2022-12-02 | Disposition: A | Discharge status: Home / Self Care | Payer: Medicaid Other | Source: Ambulatory Visit | Attending: Family | Admitting: Family

## 2022-12-02 ENCOUNTER — Other Ambulatory Visit: Payer: Self-pay

## 2022-12-02 ENCOUNTER — Emergency Department
Admission: EM | Admit: 2022-12-02 | Discharge: 2022-12-02 | Disposition: A | Discharge status: Home / Self Care | Payer: Medicaid Other | Attending: Emergency Medicine | Admitting: Emergency Medicine

## 2022-12-02 DIAGNOSIS — M25462 Effusion, left knee: Secondary | ICD-10-CM | POA: Diagnosis present

## 2022-12-02 DIAGNOSIS — X500XXA Overexertion from strenuous movement or load, initial encounter: Secondary | ICD-10-CM | POA: Insufficient documentation

## 2022-12-02 DIAGNOSIS — F840 Autistic disorder: Secondary | ICD-10-CM | POA: Diagnosis not present

## 2022-12-02 DIAGNOSIS — S728X2A Other fracture of left femur, initial encounter for closed fracture: Secondary | ICD-10-CM | POA: Insufficient documentation

## 2022-12-02 DIAGNOSIS — M25562 Pain in left knee: Secondary | ICD-10-CM | POA: Diagnosis not present

## 2022-12-02 DIAGNOSIS — S79922A Unspecified injury of left thigh, initial encounter: Secondary | ICD-10-CM | POA: Diagnosis present

## 2022-12-02 MED ORDER — ACETAMINOPHEN 160 MG/5ML PO SOLN
650.0000 mg | Freq: Once | ORAL | Status: AC
  Administered 2022-12-02: 650 mg
  Filled 2022-12-02 (×2): qty 20.3

## 2022-12-02 MED ORDER — ACETAMINOPHEN 160 MG/5ML PO ELIX
650.0000 mg | ORAL_SOLUTION | ORAL | 0 refills | Status: AC | PRN
  Filled 2022-12-02: qty 120, 1d supply, fill #0

## 2022-12-02 NOTE — ED Notes (Signed)
Md messaged about knee immobilizer. Pt is very contracted and unable to straighten leg for immobilizer. MD advised place ace bandage as tolerated.

## 2022-12-02 NOTE — ED Provider Notes (Signed)
[facility] Provider Note    Event Date/Time   First MD Initiated Contact with Patient 12/02/22 1101     (approximate)   History   Knee Pain   HPI  Lucas [name] is a 21 y.o. male   Past medical history of Duchenne muscular dystrophy with severe generalized weakness, autism spectrum disorder, restrictive lung disease, neuromuscular scoliosis with surgical correction, bedbound, who presents with left knee fracture obtained on outpatient x-ray.  Mother states that this probably happened about a week and a half ago when she was changing him in bed and when lifting the left leg she heard a crack.  Since then the knee has become swollen and the patient appears to be in pain whenever moving it.    She states that the patient's heart rate has been elevated. No other acute medical complaints.  History was obtained via the patient's mother. Reviewed external medical notes including x-ray obtained earlier today that shows "Impacted and displaced fracture of the distal left femoral metaphysis with early healing changes."     Physical Exam   Triage Vital Signs: ED Triage Vitals  Enc Vitals Group     BP 12/02/22 1048 (!) 145/86     Pulse Rate 12/02/22 1048 (!) 141     Resp 12/02/22 1048 (!) 22     Temp 12/02/22 1047 99.8 F (37.7 C)     Temp Source 12/02/22 1048 Oral     SpO2 12/02/22 1048 95 %     Weight 12/02/22 1047 116 lb (52.6 kg)     Height --      Head Circumference --      Peak Flow --      Pain Score 12/02/22 1047 6     Pain Loc --      Pain Edu? --      Excl. in GC? --     Most recent vital signs: Vitals:   12/02/22 1047 12/02/22 1048  BP:  (!) 145/86  Pulse:  (!) 141  Resp:  (!) 22  Temp: 99.8 F (37.7 C) 99.8 F (37.7 C)  SpO2:  95%    General: Awake, no distress.  CV:  Good peripheral perfusion.  Resp:  Normal effort.  Abd:  No distention.  Other:  Resting comfortably in bed, afebrile, tachycardic, nontoxic-appearing,  contracted at baseline, the affected knee has some mild swelling but no warmth and distally is neurovascular intact.   ED Results / Procedures / Treatments   Labs (all labs ordered are listed, but only abnormal results are displayed) Labs Reviewed - No data to display    RADIOLOGY I independently reviewed and interpreted x-ray of the knee performed earlier today and see a distal femur fracture.   PROCEDURES:  Critical Care performed: No  Procedures   MEDICATIONS ORDERED IN ED: Medications - No data to display  Consultants:  I spoke with orthopedics Dr. Hyacinth Meeker regarding care plan for this patient.   IMPRESSION / MDM / ASSESSMENT AND PLAN / ED COURSE  I reviewed the triage vital signs and the nursing notes.                              Differential diagnosis includes, but is not limited to, distal femur fracture, septic joint, low concern for nonaccidental trauma.    MDM: This is a patient with a femur fracture distal who is bedbound at baseline, neurovascular intact, subacute injury.  Mother has been unable to give Tylenol due to inability to pay.  I will prescribe Tylenol for her to our [facility], and consult with orthopedics per mother request but unlikely surgical candidate.  No other acute medical complaints in the joint does not appear septic.  I discussed with Dr. Sabra Heck of orthopedics who recommends Ace bandage and knee immobilizer and follow-up in clinic, nonoperative today   Patient's presentation is most consistent with acute presentation with potential threat to life or bodily function.       FINAL CLINICAL IMPRESSION(S) / ED DIAGNOSES   Final diagnoses:  Other closed fracture of left femur, unspecified portion of femur, initial encounter ([place])  Acute pain of left knee     Rx / DC Orders   ED Discharge Orders          Ordered    acetaminophen (TYLENOL) 160 MG/5ML elixir  Every 4 hours PRN        12/02/22 1143              Note:  This document was prepared using Dragon voice recognition software and may include unintentional dictation errors.    Lucillie Garfinkel, MD 12/02/22 1256

## 2022-12-02 NOTE — ED Triage Notes (Signed)
First nurse note: Pt to ED via [facility] from outpatient xray. Pt had xray on left knee and sent to ER due to possible fracture. Pt is bedbound, contracted and has intellectual disabilities. Pt has hx muscular dystrophy. Mom is with pt.

## 2022-12-02 NOTE — ED Triage Notes (Signed)
Reports las week on Wednesday started having left knee pain without a fall patient is bedbound and had xray today that reveal fx.  Caregiver reports his heart rate has been high and had a fever since the issue with the knee. Denies bed sores.

## 2022-12-02 NOTE — Discharge Instructions (Signed)
Call Dr Sabra Heck for a follow-up appointment in 1 week.  If Lucas [name] tolerates, keep him in an Ace bandage and knee immobilizer for the time being.  Use Tylenol as prescribed for pain management.

## 2022-12-08 ENCOUNTER — Encounter: Payer: Medicaid Other | Attending: Gastroenterology | Admitting: Dietician

## 2022-12-08 ENCOUNTER — Encounter: Payer: Self-pay | Admitting: Dietician

## 2022-12-08 VITALS — Ht 59.0 in | Wt 111.2 lb

## 2022-12-08 DIAGNOSIS — Z713 Dietary counseling and surveillance: Secondary | ICD-10-CM | POA: Insufficient documentation

## 2022-12-08 DIAGNOSIS — R627 Adult failure to thrive: Secondary | ICD-10-CM | POA: Diagnosis not present

## 2022-12-08 DIAGNOSIS — Z931 Gastrostomy status: Secondary | ICD-10-CM | POA: Diagnosis not present

## 2022-12-08 DIAGNOSIS — R6251 Failure to thrive (child): Secondary | ICD-10-CM | POA: Insufficient documentation

## 2022-12-08 NOTE — Progress Notes (Signed)
Medical Nutrition Therapy: Visit start time: 0930  end time: 1030  Assessment:   Referral Diagnosis: failure to thrive, enteral gastrostomy feeding Other medical history/ diagnoses: muscular dystrophy, autism, history of seizures Psychosocial issues/ stress concerns: patient with limited communication  Medications, supplements: reconciled list in medical record    Current weight: 111.2lbs Height: 4'11" BMI: 22.46 (Weight determined by subtracting wheelchair wt of 89.4lbs)   Progress and evaluation:  Mother and nursing caregiver attended visit with patient. Mom reports Lucas [name] is unable to tolerate adult formulas, due to vomiting. Does tolerate pediasure Grow and Gain, but was gaining weight on 6 cans daily, so decreased to 4, but now complains of hunger. Feeding by G-tube Mom states she was given fluid recommendation for Lucas [name] of 671ml water daily, which he has been unable to reach as he spit up some of the water, so mom feels it is too much. He is currently getting almost 530ml water in addition to fluid with feedings and juice. Lucas [name] was recently hospitalized due to diarrhea from colitis. Now resolved. He does get prune juice daily to help prevent constipation Food allergies: none  Mom would like to increase Lucas [name]'s feedings to control his hunger    Dietary Intake:   5am 1 can Pediasure Grow and gain 8oz  240kcal, 9g fat, 33g CHO, 7g protein  Prune juice 8oz followed by water, 1-2oz 9am 1 can Pediasure Grow and Gain, followed by water 1pm 1 can Pediasure Grow and Gain, followed by water 5pm 1 can Pediasure Grow and Gain, followed by water  Total daily intake: 1142 kcal, 36g fat, 180g carb, 30g protein, 60-64oz fluid   Intervention:   Nutrition Care Education: Timing of feedings to avoid overconsumption and regurgitation; discussed allowing at least 3 hours between feedings and schedule of 6am, 9am, 1pm, 4pm, 7pm to control hunger Amount of feeding to meet daily  needs Likely need for some additional protein without more carbs. Fluid needs  estimated kcal needs at 1200-1400; protein minimum of 50g daily; carb 160-190g, fat 36-50g     Other intervention notes: Purchasing additional protein supplement will be a hardship for mother. Caregiver will contact [employer] to determine possibility of obtaining covered supplement. Patient is currently at a healthy BMI, has lost from high of about 120lbs per caregiver and mother's reports. Goal is to maintain current weight, prevent further significant weight loss. No follow up scheduled at this time; mother to schedule later as needed or call with any questions or concerns.   Nutritional Diagnosis:  [place]-1.4 Altered GI function As related to intolerance of solid foods and some tube feeding formulas.  As evidenced by exclusive feeding through G-tube. NI-5.11.1 Predicted suboptimal nutrient intake As related to low protein intake, low fat intake.  As evidenced by recent weight loss and frequent complaints of hunger.   Education Materials given:  Visit summary with goals/ instructions   Learner/ who was taught:  Family member: mother Lucas [name] Caregiver Lucas [name]  Level of understanding: Verbalizes/ demonstrates competency   Demonstrated degree of understanding via:   Teach back Learning barriers: None (mother) Cognitive limitations (patient) Communication limitations (patient)  Willingness to learn/ readiness for change: Eager, change in progress   Monitoring and Evaluation:  Dietary intake, exercise, and body weight      follow up: prn

## 2022-12-08 NOTE — Patient Instructions (Signed)
Include 5 feedings of Pediasure Grow and Gain for 1200 calories.  Look for additional protein supplement to add into each feeding, 5g protein per feeding. If able to start, can decrease to 4.5 cans pediasure. Suggested feeding schedule of at least 3 hours between, ie 6am, 9am, 1pm, 4pm, 7pm. Maintain current level of water as tolerated; 16oz or 418ml daily in addition to feedings and prune juice.

## 2022-12-22 ENCOUNTER — Encounter (INDEPENDENT_AMBULATORY_CARE_PROVIDER_SITE_OTHER): Payer: Self-pay

## 2022-12-22 DIAGNOSIS — S72409A Unspecified fracture of lower end of unspecified femur, initial encounter for closed fracture: Secondary | ICD-10-CM | POA: Insufficient documentation

## 2022-12-29 ENCOUNTER — Telehealth (INDEPENDENT_AMBULATORY_CARE_PROVIDER_SITE_OTHER): Payer: Self-pay | Admitting: Pediatrics

## 2022-12-29 NOTE — Telephone Encounter (Signed)
  Name of who is calling: Victorio Palm Relationship to Patient: Mom  Best contact number: [phone]  Provider they see: [facility]  Reason for call: Mom called and stated that Medicaid is no longer providing under pads, briefs, and whips for Vadim. She would like a callback.     PRESCRIPTION REFILL ONLY  Name of prescription:  Pharmacy:

## 2022-12-29 NOTE — Telephone Encounter (Signed)
I called patient insurance company to see what the hold up was regarding patients supplies.   Insurance informed me that [employer] made a billing error for patients supplies. Insurance provided me with a phone number for [employer] to call a get walked through steps on how to bill for supplies.  I called [employer] and provided them with this information and phone number. They recorded this information in their system and sent it to the respective team.   The phone number that was provided is: [phone].  The call reference number for the call to insurance is: [id number].  Patients mother was provided with both. She is asking what is going to happen now?  I informed mom that I am unsure. [facility] has all the information needed to rectify the issue and I am unsure how long it will take for them to do so.  Mother verbalized understanding of this.   SS, CCMA

## 2023-01-12 ENCOUNTER — Encounter (INDEPENDENT_AMBULATORY_CARE_PROVIDER_SITE_OTHER): Payer: Self-pay

## 2023-01-20 ENCOUNTER — Ambulatory Visit (INDEPENDENT_AMBULATORY_CARE_PROVIDER_SITE_OTHER): Payer: Self-pay | Admitting: Pediatrics

## 2023-04-17 ENCOUNTER — Encounter (INDEPENDENT_AMBULATORY_CARE_PROVIDER_SITE_OTHER): Payer: Self-pay

## 2023-05-18 ENCOUNTER — Telehealth (INDEPENDENT_AMBULATORY_CARE_PROVIDER_SITE_OTHER): Payer: Self-pay | Admitting: Pediatrics

## 2023-05-18 ENCOUNTER — Encounter (INDEPENDENT_AMBULATORY_CARE_PROVIDER_SITE_OTHER): Payer: Self-pay | Admitting: Pediatrics

## 2023-05-18 DIAGNOSIS — R000 Tachycardia, unspecified: Secondary | ICD-10-CM

## 2023-05-18 DIAGNOSIS — J984 Other disorders of lung: Secondary | ICD-10-CM

## 2023-05-18 DIAGNOSIS — G7101 Duchenne or Becker muscular dystrophy: Secondary | ICD-10-CM

## 2023-05-18 DIAGNOSIS — M4145 Neuromuscular scoliosis, thoracolumbar region: Secondary | ICD-10-CM

## 2023-05-18 DIAGNOSIS — R0689 Other abnormalities of breathing: Secondary | ICD-10-CM

## 2023-05-18 DIAGNOSIS — G4733 Obstructive sleep apnea (adult) (pediatric): Secondary | ICD-10-CM

## 2023-05-18 NOTE — Telephone Encounter (Signed)
See response in the phone note- same RN for both providers.

## 2023-05-18 NOTE — Telephone Encounter (Signed)
Referral placed for adult cardiology in [place].   [name] MD MPH

## 2023-05-18 NOTE — Telephone Encounter (Signed)
Who's calling (name and relationship to patient) :[name]- Mom   Best contact number:[phone]  Provider they see:Artis Flock  Reason for call: Mom called in highly concerned about nonin machine is completely acting out. His o2 and heartrate is all over the place but mom says he looks good and is not in distress but she is highly concerned about the readings on the machine. I did attempt my best to get as much information from mom but she is very concerned.   Call ID:      PRESCRIPTION REFILL ONLY  Name of prescription:  Pharmacy:

## 2023-05-18 NOTE — Telephone Encounter (Signed)
Call to mom [name] she reports the pulse ox machine is not accurate. It started reading pulse over 200 with oxygen of 85%- mom reports he was at his baseline and no way that it was that. She used a finger pulse ox at same time that read 120 and 98% She tried the machine on herself and it read hers incorrectly. One of the in home nurses checked it with apical pulse and it was incorrect. Mom wants a new machine. 2. RN discussed transferring him to adult pulmonology per Dr. Damita Lack there is one in his primary care group. Mom agrees that would be easier for her.  3. Mom requests referral to adult cardiology in [place] as well aged out of [facility].  RN advised will send order to [facility], referral to Pulmonology and ask Dr. Artis Flock to refer to Cardio.

## 2023-05-19 ENCOUNTER — Telehealth (INDEPENDENT_AMBULATORY_CARE_PROVIDER_SITE_OTHER): Payer: Medicaid Other | Admitting: Family

## 2023-05-19 ENCOUNTER — Encounter (INDEPENDENT_AMBULATORY_CARE_PROVIDER_SITE_OTHER): Payer: Self-pay | Admitting: Family

## 2023-05-19 ENCOUNTER — Encounter (INDEPENDENT_AMBULATORY_CARE_PROVIDER_SITE_OTHER): Payer: Self-pay

## 2023-05-19 VITALS — HR 116

## 2023-05-19 DIAGNOSIS — J984 Other disorders of lung: Secondary | ICD-10-CM | POA: Diagnosis not present

## 2023-05-19 DIAGNOSIS — M4145 Neuromuscular scoliosis, thoracolumbar region: Secondary | ICD-10-CM

## 2023-05-19 DIAGNOSIS — G7101 Duchenne or Becker muscular dystrophy: Secondary | ICD-10-CM | POA: Diagnosis not present

## 2023-05-19 DIAGNOSIS — R0689 Other abnormalities of breathing: Secondary | ICD-10-CM

## 2023-05-19 DIAGNOSIS — G4733 Obstructive sleep apnea (adult) (pediatric): Secondary | ICD-10-CM

## 2023-05-19 DIAGNOSIS — G7100 Muscular dystrophy, unspecified: Secondary | ICD-10-CM

## 2023-05-19 NOTE — Patient Instructions (Signed)
It was a pleasure to see you today!  Instructions for you until your next appointment are as follows: I will order a new pulse oximeter for Demetries I will check on the referrals for the cardiologist and pulmonologist I will check on getting another filter for the cough assist machine Please sign up for MyChart if you have not done so. Be sure to keep the appointment with Dr Artis Flock next week   Feel free to contact our office during normal business hours at [phone] with questions or concerns. If there is no answer or the call is outside business hours, please leave a message and our clinic staff will call you back within the next business day.  If you have an urgent concern, please stay on the line for our after-hours answering service and ask for the on-call neurologist.     I also encourage you to use MyChart to communicate with me more directly. If you have not yet signed up for MyChart within [facility], the front desk staff can help you. However, please note that this inbox is NOT monitored on nights or weekends, and response can take up to 2 business days.  Urgent matters should be discussed with the on-call pediatric neurologist.   At [facility], we are committed to providing exceptional care. You will receive a patient satisfaction survey through text or email regarding your visit today. Your opinion is important to me. Comments are appreciated.

## 2023-05-19 NOTE — Progress Notes (Signed)
This is a [facility] E-Visit consult/follow up provided via My Chart Video Visit (Caregility). Lucas [name] and his mother Lucas [name] consented to an E-Visit consult today.  Is the patient present for the video visit? Yes Location of patient: Lucas [name] is at home Is the patient located in the state of [place]? Yes Location of provider: [name], NP-C is at office Patient was referred by Kandyce Rud, MD   The following participants were involved in this E-Visit: CMA, NP, patient's mother  This visit was done via VIDEO   Chief Complain/ Reason for E-Visit today: Face to face visit for pulse oximeter Total time on call: 15 min Follow up: to see Dr Artis Flock next week as scheduled   Lucas [name]   MRN:  [id number]  07-04-2001   Provider: [name] NP-C Location of Care: [facility]  Visit type: Urgent return visit  Last visit:   Referral source: Kandyce Rud, MD History from: Epic chart and patient's mother  Brief history:  Copied from previous record: History of Duchenne Muscular Dystrophy, autism spectrum disorder, level 3, restrictive lung disease, compensated congestive heart failure, severe dysphagia requiring tube feeding, obstructive sleep apnea requiring treatment with BiPAP.   Today's concerns: Lucas [name] is seen today on urgent basis because his mother reported that his pulse oximeter is not reading accurately and needs replacement. She reports that yesterday Lucas [name]'s private duty nurse noted problems with the pulse oximeter reading his heart rate as high as 285, and the O2 saturation as low as 85%. When assessed with a stethoscope and intermittent finger clip pulse oximeter, his heart rate was in normal range of 90-110's and oxygen saturation of 95-97%. She and the nurse attempted to troubleshoot the device without success and it continued to read erratic numbers.  Mom also asked about an  additional filter for his cough assist machine.  Mom asked if a referral has been made to cardiology and pulmonology as he is aging out of his pediatric providers Lucas [name] has been otherwise generally healthy since he was last seen. No health concerns today other than previously mentioned.  Review of systems: Please see HPI for neurologic and other pertinent review of systems. Otherwise all other systems were reviewed and were negative.  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   Right knee injury 06/30/2016   Constipation    Contractures involving both knees 07/21/2015   Seizure (HCC) 06/08/2015   Osteoporosis 10/30/2014   Autism spectrum disorder with accompanying language impairment and intellectual disability, requiring very substantial support 06/17/2014   Complex care coordination 04/30/2014   Partial epilepsy with impairment of consciousness (HCC) 04/30/2014   Duchenne muscular dystrophy (HCC) 04/30/2014     Past Medical History:  Diagnosis Date   Autism    Autism    Community acquired pneumonia of left lower lobe of lung 03/03/2022   Diarrhea 06/09/2015   Family history of adverse reaction to anesthesia    MGGM- N/V   Muscular dystrophy (HCC)    Scoliosis    Seizures (HCC)    Last  one 2013   Sepsis (HCC) 03/03/2022   Transient alteration of awareness    Viral gastroenteritis    Yeast infection of the skin 10/24/2020    Past medical history comments: See HPI Copied from previous record: EEG showed right central diphasic sharply contoured slow-wave  activity.  MRI of the brain failed to show a structural abnormality.  He has been seizure-free since that time.  Autism was diagnosed at age 43, diagnosis of Duchenne muscular dystrophy was made at age 78.  He is wheelchair bound.  He is unable to communicate.   Hospitalized due to constipation 06/08/14 until 06/11/14.   He had a split night polysomnogram on September 17, 2017, which showed significant sleep apnea with 2 episodes of obstructive apnea, 1 central apnea, and 8 hypopneas.  Most of his night was spent awake.  He also had light natural sleep and deep sleep but no rapid eye movement sleep.  He showed significant improvement in his episodes of apnea with CPAP pressures of 4, 6, and 8.  He did not sleep on the lower doses but did on the higher dose.  He was tested with a large mask and a small one and tolerated the large mask better.   Birth History 6 bs. 13 oz. Infant born at full-term to a 11 year old primigravida.   Mother gained more than 25 pounds and took medications other than vitamins and iron.   Labor lasted for 12 hours.   Normal spontaneous vaginal delivery.   The child may have had an infection in the nursery. Details are uncertain.   Growth and development was delayed for gross motor skills including pulling to stand and walking alone. He was also significantly delayed for his language.   Surgical history: Past Surgical History:  Procedure Laterality Date   CIRCUMCISION     at birth   LAPAROSCOPIC GASTROSTOMY N/A 05/24/2017   Procedure: LAPAROSCOPIC GASTROSTOMY TUBE PLACEMENT;  Surgeon: Kandice Hams, MD;  Location: [name];  Service: General;  Laterality: N/A;   SPINE SURGERY N/A    Phreesia 06/29/2020     Family history: family history includes Alcohol abuse in his father; Asthma in an other family member; Cancer in his paternal grandfather; Dementia in his paternal grandmother; Heart failure in his maternal grandmother; Hyperlipidemia in his mother and another family member;  Hypertension in his maternal grandmother and another family member; Learning disabilities in his mother; Other in his maternal aunt, maternal grandmother, maternal uncle, and mother; Vision loss in an other family member.   Social history: Social History   Socioeconomic History   Marital status: Single    Spouse name: Not on file   Number of children: Not on file   Years of education: Not on file   Highest education level: Not on file  Occupational History   Not on file  Tobacco Use   Smoking status: Never    Passive exposure: Yes   Smokeless tobacco: Never   Tobacco comments:    Mom smokes outside  Vaping Use   Vaping Use: Never used  Substance and Sexual Activity   Alcohol use: No   Drug use: No   Sexual activity: Never  Other Topics Concern   Not on file  Social History Narrative   He lives with mother.   He graduated high school in 22.    He is not currently in a day program    Social Determinants of Health   Financial Resource Strain: Not on file  Food  Insecurity: Not on file  Transportation Needs: Not on file  Physical Activity: Not on file  Stress: Not on file  Social Connections: Not on file  Intimate Partner Violence: Not on file   Past/failed meds:  Allergies: Allergies  Allergen Reactions   Lisinopril Rash    Red bubbly rash   Penicillins Hives and Rash    Has patient had a PCN reaction causing immediate rash, facial/tongue/throat swelling, SOB or lightheadedness with hypotension: Yes Has patient had a PCN reaction causing severe rash involving mucus membranes or skin necrosis: Yes Has patient had a PCN reaction that required hospitalization: No Has patient had a PCN reaction occurring within the last 10 years: No If all of the above answers are "NO", then may proceed with Cephalosporin use.    Docusate Sodium     Rash and cant sleep   Multivitamins     Red in face, itchy, bumps   Other Other (See Comments)    Senkot -- gets rash and cant sleep    Calcitonin Rash   Esomeprazole Magnesium Nausea And Vomiting   Omeprazole Other (See Comments)    Constipation and possible rash after drug was stopped   Simethicone Palpitations and Rash   Sunflower Oil Itching, Rash and Other (See Comments)    GI upset - Avoid products containing this additive   Vitamin D Analogs Other (See Comments)    Rash on face if it contains sunflower seed oil Excessive urine output and thirsty, increase in gas    Immunizations: Immunization History  Administered Date(s) Administered   DTaP 08/20/2001, 10/26/2001, 01/11/2002, 10/18/2002, 08/19/2005   Dtap, Unspecified 08/20/2001, 10/26/2001, 01/11/2002, 10/18/2002, 08/19/2005   HIB (PRP-T) 08/20/2001, 10/26/2001, 01/11/2002, 07/11/2002   HPV 9-valent 06/24/2019   Hepatitis A, Ped/Adol-2 Dose 08/21/2014, 11/11/2016   Hepatitis B 04/08/2001, 08/20/2001, 01/11/2002   IPV 08/20/2001, 10/26/2001, 01/11/2002, 08/19/2005   Influenza, Seasonal, Injecte, Preservative Fre 10/15/2009, 10/06/2010, 11/23/2011, 10/11/2012   Influenza,inj,Quad PF,6+ Mos 08/20/2015, 10/03/2016, 09/04/2017, 09/13/2018, 09/10/2020   Influenza-Unspecified 10/15/2009, 10/06/2010, 11/23/2011, 10/11/2012, 09/18/2013, 10/02/2014, 08/20/2015, 10/03/2016, 09/04/2017, 09/13/2018, 09/10/2019, 09/20/2020, 10/15/2021   MMR 10/18/2002, 08/19/2005   Meningococcal B, OMV 06/24/2019   Meningococcal Conjugate 08/21/2014, 02/16/2018   Moderna SARS-COV2 Booster Vaccination 11/18/2020, 04/29/2021   Moderna Sars-Covid-2 Vaccination 03/24/2020, 03/24/2020, 04/20/2020, 11/18/2020   Pneumococcal Conjugate-13 10/26/2001, 01/11/2002, 07/11/2002, 10/18/2002   Pneumococcal-Unspecified 10/26/2001, 01/11/2002, 07/11/2002, 10/18/2002   Tdap 10/11/2012   Varicella 07/11/2002, 10/15/2009    Diagnostics/Screenings:  Physical Exam: Pulse (!) 116   SpO2 97% Comment: on room air  Examination was limited by video format General: well developed, well nourished young man,  lying in his bed, in no evident distress Head: normocephalic and atraumatic.  Neck: supple Musculoskeletal: no skeletal deformities. Has neuromuscular scoliosis. Skin: no rashes or neurocutaneous lesions  Neurologic Exam Mental Status: awake and fully alert. Has no language.  Smiles responsively.  Cranial Nerves: turns to localize faces and objects in the periphery. Turns to localize sounds in the periphery. Facial movements are symmetric Motor: spastic quadriparesis  Sensory: withdrawal x 4 Coordination: unable to adequately assess due to patient's inability to participate in examination. Does not reach for objects. Gait and Station: unable to stand and bear weight.  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 No recent diagnostic studies to be reviewed with the patient. Lucas [name] needs a replacement pulse oximeter with a wrap style probe in order to continuously monitor his oxygen saturations.  Plan until next visit: New pulse oximeter ordered I will check on the referrals to adult cardiology and pulmonology providers for Lucas [name] I will check on getting him a filter for the cough assist device Call for questions or concerns Be sure to keep the upcoming appointment with Dr Artis Flock  The medication list was reviewed and reconciled. No changes were made in the prescribed medications today. A complete medication list was provided to the patient.  Orders Placed This Encounter  Procedures   Ambulatory Referral  for DME    Referral Priority:   Routine    Referral Type:   Durable Medical Equipment Purchase    Number of Visits Requested:   1    Allergies as of 05/19/2023       Reactions   Lisinopril Rash   Red bubbly rash   Penicillins Hives, Rash   Has patient had a PCN reaction causing immediate rash, facial/tongue/throat swelling, SOB or lightheadedness with hypotension: Yes Has patient had a PCN reaction causing severe rash involving mucus membranes or skin necrosis: Yes Has patient had a PCN reaction that required hospitalization: No Has patient had a PCN reaction occurring within the last 10 years: No If all of the above answers are "NO", then may proceed with Cephalosporin use.   Docusate Sodium    Rash and cant sleep   Multivitamins    Red in face, itchy, bumps   Other Other (See Comments)   Senkot -- gets rash and cant sleep   Calcitonin Rash   Esomeprazole Magnesium Nausea And Vomiting   Omeprazole Other (See Comments)   Constipation and possible rash after drug was stopped   Simethicone Palpitations, Rash   Sunflower Oil Itching, Rash, Other (See Comments)   GI upset - Avoid products containing this additive   Vitamin D Analogs Other (See Comments)   Rash on face if it contains sunflower seed oil Excessive urine output and thirsty, increase in gas        Medication List        Accurate as of May 19, 2023  2:26 PM. If you have any questions, ask your nurse or doctor.          acetaminophen 160 MG/5ML solution Commonly known as: TYLENOL Take 10 mLs (320 mg total) by mouth every 6 (six) hours as needed for mild pain, moderate pain, fever or headache.   acetaminophen 160 MG/5ML elixir Commonly known as: TYLENOL Take 20.3 mLs (650 mg total) by mouth every 4 (four) hours as needed for pain.   carbamazepine 100 MG chewable tablet Commonly known as: TEGRETOL TAKE 1 & 1/2 TABLETS BY MOUTH TWICE A DAY   levalbuterol 1.25 MG/3ML nebulizer solution Commonly known as:  XOPENEX Take 1.25 mg by nebulization every 4 (four) hours as needed.   lisinopril 2.5 MG tablet Commonly known as: ZESTRIL Take 1 tablet by mouth daily.   nystatin powder Commonly known as: MYCOSTATIN/NYSTOP Apply 1 Application topically 3 (three) times daily.   PediaSure Grow & Gain Liqd Drink 1 bottle spaced out 6 times a day What changed: additional instructions   polyethylene glycol powder 17 GM/SCOOP powder Commonly known as:  GoodSense ClearLax Take 17 g by mouth 2 (two) times daily. With prune juice   prednisoLONE 15 MG/5ML solution Commonly known as: ORAPRED PLACE 3 ML INTO FEEDING TUBE DAILY      Total time spent with the patient was 15 minutes, of which 50% or more was spent in counseling and coordination of care.  Lucas Rising NP-C [facility] [address] [place], [place] [address] Ph. [phone] Fax [phone]

## 2023-05-23 NOTE — Progress Notes (Addendum)
Patient: Lucas [name] MRN: [id number] Sex: male DOB: March 30, 2001  Provider: [name], MD Location of Care: [facility]- Pediatric Complex Care Note type: Routine return visit   This is a Pediatric Specialist E-Visit follow up consult provided via MyChart Lucas [name] and their parent/guardian Lucas [name] consented to an E-Visit consult today.  Location of patient: [name] is at home Location of provider: [name], MD is at [facility]  The following participants were involved in this E-Visit:  [name], MD, Lucas [name], CMA, Lucas [name], Scribe, Lucas [name], patient, and their parent/guardian Lucas [name].  This visit was done via VIDEO    History of Present Illness: Referral Source: Collene Schlichter, MD  History from: patient and prior records Chief Complaint: Pediatric Complex Care  Lucas [name] is a 22 y.o. male with history of Duchenne Muscular Dystrophy, autism spectrum disorder, level 3, restrictive lung disease, compensated congestive heart failure, severe dysphagia requiring tube feeding, and obstructive sleep apnea who I am seeing in follow-up for complex care management. Patient was last seen 11/24/22 where I continued carbamazepine.  Since that appointment, mom has called to report continued pain his his leg, for which [name], [name] ordered an x-ray which showed impacted and displaced fracture of the distal left femoral metaphysis with early healing changes. He was seen in the ED for this on 12/02/22 for which they provided tylenol and assisted with immobilizing the leg.   Patient presents today with his mother. They report their largest concern is his weight gain.  Symptom management:  Mom reports that she has been giving 5 cans of Pediasure grow and gain. But on this he has gained lots of weight, mom would like the orders for nursing to say 4 cans per day. Upon review, nurse found orders that state 4 per day, plan to  continue at this amount.   No seizures since the last visit.   Had one episode of constipation but this was related to a missed dose of Miralax.   Care coordination (other providers): He saw [name], RD with the nutrition [facility] diabetes center for diet management on 12/08/22 were they recommended continuing 5 Pediasure Grow and Gain per day while looking to add protein to his diet.   He saw [name] on 04/04/23 with [facility] cardiology in [place] who recommended annual follow up with adult cardiology he endorsed beginning lisinopril 2.5 mg daily. Transitioned to losartan as he had allergic reaction (rash on chest and back) to lisinopril.   Has been referred to adult pulmonology and cardiology in [place]. He is scheduled with [name] for Cardiology 08/08/23, but no appointment for pulmonology has been scheduled.   Care management needs:  No longer receiving case management.   Equipment needs:  Saw [name], NPC on 05/19/23 to document his need for a new pulse ox machine. She is transitioning to [employer].   She has a blood pressure cuff from her mother, but she is interested in getting one through his insurance.   Past Medical History Past Medical History:  Diagnosis Date   Autism    Autism    Community acquired pneumonia of left lower lobe of lung 03/03/2022   Diarrhea 06/09/2015   Family history of adverse reaction to anesthesia    MGGM- N/V   Muscular dystrophy (HCC)    Scoliosis    Seizures (HCC)    Last one 2013   Sepsis (HCC) 03/03/2022   Transient alteration of awareness  Viral gastroenteritis    Yeast infection of the skin 10/24/2020    Surgical History Past Surgical History:  Procedure Laterality Date   CIRCUMCISION     at birth   LAPAROSCOPIC GASTROSTOMY N/A 05/24/2017   Procedure: LAPAROSCOPIC GASTROSTOMY TUBE PLACEMENT;  Surgeon: Kandice Hams, MD;  Location: [name];  Service: General;  Laterality: N/A;   SPINE SURGERY N/A    Phreesia 06/29/2020     Family History family history includes Alcohol abuse in his father; Asthma in an other family member; Cancer in his paternal grandfather; Dementia in his paternal grandmother; Heart failure in his maternal grandmother; Hyperlipidemia in his mother and another family member; Hypertension in his maternal grandmother and another family member; Learning disabilities in his mother; Other in his maternal aunt, maternal grandmother, maternal uncle, and mother; Vision loss in an other family member.   Social History Social History   Social History Narrative   He lives with mother.   He graduated high school in 22.    He is not currently in a day program     Allergies Allergies  Allergen Reactions   Lisinopril Rash    Red bubbly rash   Penicillins Hives and Rash    Has patient had a PCN reaction causing immediate rash, facial/tongue/throat swelling, SOB or lightheadedness with hypotension: Yes Has patient had a PCN reaction causing severe rash involving mucus membranes or skin necrosis: Yes Has patient had a PCN reaction that required hospitalization: No Has patient had a PCN reaction occurring within the last 10 years: No If all of the above answers are "NO", then may proceed with Cephalosporin use.    Docusate Sodium     Rash and cant sleep   Multivitamins     Red in face, itchy, bumps   Other Other (See Comments)    Senkot -- gets rash and cant sleep   Calcitonin Rash   Esomeprazole Magnesium Nausea And Vomiting   Omeprazole Other (See Comments)    Constipation and possible rash after drug was stopped   Simethicone Palpitations and Rash   Sunflower Oil Itching, Rash and Other (See Comments)    GI upset - Avoid products containing this additive   Vitamin D Analogs Other (See Comments)    Rash on face if it contains sunflower seed oil Excessive urine output and thirsty, increase in gas    Medications Current Outpatient Medications on File Prior to Visit  Medication Sig  Dispense Refill   levalbuterol (XOPENEX) 1.25 MG/3ML nebulizer solution Take 1.25 mg by nebulization every 4 (four) hours as needed.     nystatin (MYCOSTATIN/NYSTOP) powder Apply 1 Application topically 3 (three) times daily.     polyethylene glycol powder (GOODSENSE CLEARLAX) 17 GM/SCOOP powder Take 17 g by mouth 2 (two) times daily. With prune juice 3060 g 3   acetaminophen (TYLENOL) 160 MG/5ML elixir Take 20.3 mLs (650 mg total) by mouth every 4 (four) hours as needed for pain. (Patient not taking: Reported on 05/19/2023) 120 mL 0   acetaminophen (TYLENOL) 160 MG/5ML solution Take 10 mLs (320 mg total) by mouth every 6 (six) hours as needed for mild pain, moderate pain, fever or headache. (Patient not taking: Reported on 05/19/2023) 473 mL 1   No current facility-administered medications on file prior to visit.   The medication list was reviewed and reconciled. All changes or newly prescribed medications were explained.  A complete medication list was provided to the patient/caregiver.  Physical Exam There were no vitals taken  for this visit. Weight for age: Facility age limit for growth %iles is 20 years.  Length for age: Facility age limit for growth %iles is 20 years. BMI: There is no height or weight on file to calculate BMI. No results found. Exam limited by virtual visit General: Patient sleeping initially, wakes easily by mother. NAD HEENT: normocephalic, no eye or nose discharge.  MMM  Cardiovascular: appears well perfused Lungs: Normal work of breathing, Neuro: Awake alert.  Responds to mother's commands. EOM intact, face symmetric.    Diagnosis:  1. Duchenne muscular dystrophy (HCC)   2. Partial epilepsy with impairment of consciousness   3. Obstructive sleep apnea treated with bilevel positive airway pressure (BiPAP)   4. 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 Feeding by G-tube (HCC)      Assessment and Plan OLMAN CHATTIN is a 22 y.o. male with  history of Duchenne Muscular Dystrophy, autism spectrum disorder, level 3, restrictive lung disease, compensated congestive heart failure, severe dysphagia requiring tube feeding, and obstructive sleep apnea who presents for follow-up in the pediatric complex care clinic.  Patient seen by case manager, dietician, integrated behavioral health today as well, please see accompanying notes.  I discussed case with all involved parties for coordination of care and recommend patient follow their instructions as below.   Symptom management:  Seizures are well controlled on current medication regimen, recommend continuing this today. To address reported weight gain, reviewed orders for 4 cans of Pediasure grow and gain with mom today, will update orders with [facility]. I also reviewed importance of regular regimen of Miralax to prevent constipation, plan to refer to GI for any concerns. Patient does need to establish with adult Pulmonology and Cardiology, will refill medications managed by these specialties until established.   - Refilled all AED at 90 day supply  - Refilled prednisolone while mother works to get him scheduled with adult pulmonologist.   Care coordination: - Recommend mom keep upcoming apt with cardiology, reminded mom of date and time.  - Discussed follow up with adult pulmonology, agreed to follow up on referral   Care management needs:  - Decreased orders for pediasure grow and gain to 4 per day   Equipment needs:  - It is medically necessary for patient to have blood pressure checks daily. Mother requires electronic cuff to accurately take measurements as she is unable to manually take them. Cannot afford batteries for portable cuff, requests device that plugs in.  - Patient continues to need a replacement pulse oximeter with a wrap style probe in order to continuously monitor his oxygen saturations.  - Due to patient's medical condition, patient is indefinitely incontinent of stool and  urine.  It is medically necessary for them to use diapers, underpads, and gloves to assist with hygiene and skin integrity.  They require a frequency of up to 200 a month.   Decision making/Advanced care planning: - Mother has reviewed MOST form and he remains at full code.   The CARE PLAN for reviewed and revised to represent the changes above.  This is available in Epic under snapshot, and a physical binder provided to the patient, that can be used for anyone providing care for the patient.   I spent 40 minutes on day of service on this patient including review of chart, discussion with patient and family, discussion of screening results, coordination with other providers and management of orders and paperwork.     Return in about 6  months (around 11/28/2023).  I, Lucas [name], scribed for and in the presence of [name], MD at today's visit on 05/29/2023.   I, [name] MD MPH, personally performed the services described in this documentation, as scribed by Lucas [name] in my presence on 05/29/2023 and it is accurate, complete, and reviewed by me.    [name] MD MPH Neurology,  Neurodevelopment and Neuropalliative care [facility] Child Neurology  [address] [place], [place], [place] [address] Phone: [phone] Fax: [phone]

## 2023-05-29 ENCOUNTER — Encounter (INDEPENDENT_AMBULATORY_CARE_PROVIDER_SITE_OTHER): Payer: Self-pay | Admitting: Pediatrics

## 2023-05-29 ENCOUNTER — Telehealth (INDEPENDENT_AMBULATORY_CARE_PROVIDER_SITE_OTHER): Payer: Medicaid Other | Admitting: Pediatrics

## 2023-05-29 DIAGNOSIS — J984 Other disorders of lung: Secondary | ICD-10-CM

## 2023-05-29 DIAGNOSIS — G7101 Duchenne or Becker muscular dystrophy: Secondary | ICD-10-CM | POA: Diagnosis not present

## 2023-05-29 DIAGNOSIS — G4733 Obstructive sleep apnea (adult) (pediatric): Secondary | ICD-10-CM | POA: Diagnosis not present

## 2023-05-29 DIAGNOSIS — G40209 Localization-related (focal) (partial) symptomatic epilepsy and epileptic syndromes with complex partial seizures, not intractable, without status epilepticus: Secondary | ICD-10-CM

## 2023-05-29 DIAGNOSIS — G7100 Muscular dystrophy, unspecified: Secondary | ICD-10-CM

## 2023-05-29 DIAGNOSIS — Z931 Gastrostomy status: Secondary | ICD-10-CM

## 2023-05-29 MED ORDER — PREDNISOLONE SODIUM PHOSPHATE 15 MG/5ML PO SOLN: ORAL | 5 refills | Status: DC

## 2023-05-29 MED ORDER — CARBAMAZEPINE 100 MG PO CHEW
CHEWABLE_TABLET | ORAL | 5 refills | Status: DC
Start: 2023-05-29 — End: 2023-11-27

## 2023-05-29 MED ORDER — PEDIASURE GROW & GAIN PO LIQD: ORAL | 12 refills | Status: DC

## 2023-05-29 NOTE — Patient Instructions (Addendum)
I refilled all of his seizure medications.  I put in an order for a new blood pressure cuff.  I decreased his order to 4 cans of Pediasure per day so they ship you less. For now I do not think he needs to follow up with the nutrition center.  He is scheduled to see an adult cardiologist 08/08/2023 2:00 PM. This is with [name].  We will follow up on the referral to an adult pulmonologist in [place].  I refilled his prednisolone in the meantime.

## 2023-06-02 ENCOUNTER — Encounter (INDEPENDENT_AMBULATORY_CARE_PROVIDER_SITE_OTHER): Payer: Self-pay

## 2023-06-07 ENCOUNTER — Ambulatory Visit: Payer: Medicaid Other | Admitting: Dietician

## 2023-06-07 ENCOUNTER — Telehealth (INDEPENDENT_AMBULATORY_CARE_PROVIDER_SITE_OTHER): Payer: Self-pay | Admitting: Pediatrics

## 2023-06-07 NOTE — Telephone Encounter (Signed)
  Name of who is calling: Lucas [name]  Caller's Relationship to Patient: Mom  Best contact number: [phone]  Provider they see: Dr. Artis Flock  Reason for call: Pt has been put on a potasium blood pressure medication by cardiologist and had no clue he was on morton salt, he had a reaction and mom is needing documentation on why he was put on that. It is no where in his chart per mom and that's why the cardiologist did not know. Mom is wanting the salt to be put on medication chart so the reaction never happens again.      PRESCRIPTION REFILL ONLY  Name of prescription:  Pharmacy:

## 2023-06-07 NOTE — Telephone Encounter (Signed)
We did not discuss salt at the last appointment, and it would be out of my scope to ever recommend salt. This can be recommended by a dietician, endocrinologist, or cardiologist, but I have reviewed the chart including the dietician's notes, and it is not documented anywhere. We do have hypertonic saline for a nebulizer. I suspect it is in an old nursing order from some point that is no longer valid.   I recommend his current cardiologist and/or dietician give recommendations regarding salt at this time.    [name] MD MPH

## 2023-06-07 NOTE — Telephone Encounter (Signed)
Contacted patients mother. Verified patients name and DOB as well as mothers name.   Mom stated that Dr. Artis Flock prescribed Morton's salt on the last visit. Mom did clarify that she is referring to salt that is used on/in food. A blue canister with a lady holding an umbrella. Mom stated that she understood that it could not be added to his chart because it is an over the counter medication..........................  I informed mom that what she is describing more than likely would not be prescribed by a provider, nor is it a medication......................Marland Kitchen  Mother verbalized understanding of this.  SS, CCMA

## 2023-06-08 NOTE — Telephone Encounter (Signed)
Attempted to contact patients mother. Mother was unable to be reached.  LVM to call back.  SS, CCMA

## 2023-06-11 ENCOUNTER — Encounter (INDEPENDENT_AMBULATORY_CARE_PROVIDER_SITE_OTHER): Payer: Self-pay | Admitting: Pediatrics

## 2023-06-13 ENCOUNTER — Encounter (INDEPENDENT_AMBULATORY_CARE_PROVIDER_SITE_OTHER): Payer: Self-pay

## 2023-06-19 ENCOUNTER — Telehealth: Payer: Self-pay

## 2023-06-19 ENCOUNTER — Telehealth (INDEPENDENT_AMBULATORY_CARE_PROVIDER_SITE_OTHER): Payer: Self-pay | Admitting: Pediatrics

## 2023-06-19 NOTE — Telephone Encounter (Signed)
Who's calling (name and relationship to patient) :[facility]   Best contact number:[phone]  Provider they see:Artis Flock  Reason for call:Donna from [facility] called in requesting to speak to Dr Artis Flock in regards to clarification on a feed order for Lucas [name].    Call ID:      PRESCRIPTION REFILL ONLY  Name of prescription:  Pharmacy:

## 2023-06-19 NOTE — Telephone Encounter (Signed)
They called wanted to speak to the nurse transfer call to [facility].

## 2023-06-19 NOTE — Telephone Encounter (Signed)
[facility] is calling because they are needing to clarify the dosing of his feeds and flushing. Looks like last documentation of feeds was on 11/09/2022 and he was getting 6 feeds a day. She states mom states he is only doing 4 feeds today. She asked who gave her the okay to decrease the does. Informed nurse that we did refer patient to a nutritionist and they might had told them to. When researching this information the patient no showed the appointment with the nutritionist on  06/07/2023 and has not reschedule the appointment. She asked the number for there office. I gave her [phone]

## 2023-06-20 NOTE — Telephone Encounter (Signed)
Contacted Ms. Lupita Leash back and confirmed the updated order as of Dr. Artis Flock last VV. Ms. Lupita Leash will fax over the order form. Provided Ms. Lupita Leash with our fax number.  SS, CCMA

## 2023-06-20 NOTE — Telephone Encounter (Signed)
Lucas [name] called in stating she never received a call back yesterday 7/8 and is requesting a call back.

## 2023-06-22 NOTE — Telephone Encounter (Signed)
[facility] faxed over the order.  The order was printed and placed on providers desk.  Provider signed the order. Order was faxed back to [facility] on 7.10.2024  SS, CCMA

## 2023-08-02 ENCOUNTER — Institutional Professional Consult (permissible substitution): Payer: Medicaid Other | Admitting: Pulmonary Disease

## 2023-08-08 ENCOUNTER — Ambulatory Visit: Payer: Medicaid Other | Admitting: Cardiovascular Disease

## 2023-08-11 ENCOUNTER — Telehealth (INDEPENDENT_AMBULATORY_CARE_PROVIDER_SITE_OTHER): Payer: Self-pay | Admitting: Pediatrics

## 2023-08-11 NOTE — Telephone Encounter (Signed)
 Attempted to contact patients mother. Mother unable to be reached  LVM to call back.  SS, CCMA

## 2023-08-11 NOTE — Telephone Encounter (Signed)
[name]( mom) called back returning call from [place].

## 2023-08-11 NOTE — Telephone Encounter (Signed)
Mom called back.   I informed mom that I have sent the previous message to Dr. Artis Flock and am awaiting her response. Mother reiterated what was send in the previous call.  SS, CCMA

## 2023-08-11 NOTE — Telephone Encounter (Signed)
Lucas [name] needs his weight tracked to determine further changes to his feeding regimen. I recommend he return to his dietician to repeat his weight and decide any change in plan.  Dietician is [name], [phone].  [name] MD MPH

## 2023-08-11 NOTE — Telephone Encounter (Signed)
Who's calling (name and relationship to patient) : Trevell Stjean; mom   Best contact number: [phone]   Provider they see: Dr. Artis Flock  Reason for call: Mom stated that Chrostopher keeps asking for food again. She stated that this is something he has started doing. She stated that Dr. Artis Flock cut the Pediasure Growth and Gain back to four 3 months ago. Mom stated she will feed in 5 today and wants to know if it can be increased back to 5 and if he starts to gain the weight again she can cut it. Mom is requesting a call back.    Call ID:      PRESCRIPTION REFILL ONLY  Name of prescription:  Pharmacy:

## 2023-08-15 NOTE — Telephone Encounter (Addendum)
Contacted patients mother.  Verified patients name and DOB as well as mothers name.   I relayed the previous message from provider to mom. Mom stated that she would just stick to the four a day and when he asks for more she will give him more. I asked mom if she wanted the number to his dietician, mom stated that she did not.  SS, CCMA

## 2023-08-25 ENCOUNTER — Telehealth (INDEPENDENT_AMBULATORY_CARE_PROVIDER_SITE_OTHER): Payer: Self-pay | Admitting: Pediatrics

## 2023-08-25 NOTE — Telephone Encounter (Signed)
Attempted to contact Lucas [name] to gather more information in this.  Phone rang repeatedly. Unable to LVM.  SS, CCMA

## 2023-08-25 NOTE — Telephone Encounter (Signed)
  Name of who is calling: Duard Larsen Relationship to Patient: RN supervisor   Best contact number: [phone]   Provider they see: Artis Flock  Reason for call: RN supervisor with [facility] providing care for the patient called with some questions about the CPT treatments for the patient. Specifically they are asking about what settings are used for the machine with respect to frequency and pressure.

## 2023-08-27 NOTE — Telephone Encounter (Signed)
This is The Vest, a respiratory vest provided by [employer].  For settings, this is managed by pulmonology so sending to Sarah.  However the recommendation is to use it twice daily, increase to 3-4 times daily when sick.    [name] MD MPH

## 2023-08-29 ENCOUNTER — Telehealth (INDEPENDENT_AMBULATORY_CARE_PROVIDER_SITE_OTHER): Payer: Self-pay | Admitting: Pediatrics

## 2023-08-29 DIAGNOSIS — J984 Other disorders of lung: Secondary | ICD-10-CM

## 2023-08-29 DIAGNOSIS — R0689 Other abnormalities of breathing: Secondary | ICD-10-CM

## 2023-08-29 NOTE — Telephone Encounter (Signed)
Returned call to [employer] - they need CPT vest orders.  Tried to review chart, unable to find specific orders/instructions.  Told her the nurse for pulmonology is out of the office until Thurs.  I will try to reach out to find an answer but it may be Thursday when Maralyn Sago is back in the office.  She said that is fine.  She just needs the orders for his next plan of care.

## 2023-08-29 NOTE — Telephone Encounter (Signed)
Who's calling (name and relationship to patient) : Lupita Leash; [facility]   Best contact number: [phone]  Provider they see: Dr. Damita Lack  Reason for call: Lupita Leash was callling in to speak with the nurse. Need a clarification for CPT treatment settings. She is requesting a call back.    Call ID:      PRESCRIPTION REFILL ONLY  Name of prescription:  Pharmacy:

## 2023-08-30 ENCOUNTER — Telehealth: Payer: Self-pay

## 2023-08-30 NOTE — Telephone Encounter (Signed)
Patient is scheduled to see Dr. Larinda Buttery tomorrow for a sleep consult. Dr. Larinda Buttery is not a sleep provider. Pt is also scheduled for sleep consult 10/05/2023 with TP. He should keep this appt and cancel 09/19.  Lm for patient. ATC patient's cousin, Scott(DPR). Unable to leave vm due to mailbox not being setup.

## 2023-08-30 NOTE — Telephone Encounter (Signed)
Spoke to patient's legal guardian(DPR). She is aware of need to reschedule tomorrow's visit. She states that patient is not having any pulmonary problem that this visit is to discuss OSA.  [name] will keep scheduled visit for 10/05/2023. Nothing further needed.

## 2023-08-31 ENCOUNTER — Institutional Professional Consult (permissible substitution): Payer: Medicaid Other | Admitting: Pulmonary Disease

## 2023-08-31 NOTE — Telephone Encounter (Signed)
Message to Dr. Damita Lack to verify settings- freq is 2-4 x a day depending on whether he is having increased secretions.  Lupita Leash is not in the office requested she call back tomorrow

## 2023-09-01 NOTE — Addendum Note (Signed)
Addended by: Vita Barley B on: 09/01/2023 03:04 PM   Modules accepted: Orders

## 2023-09-05 NOTE — Telephone Encounter (Signed)
Faxed Chest PT orders to [facility] [phone]

## 2023-09-15 ENCOUNTER — Institutional Professional Consult (permissible substitution): Payer: Medicaid Other | Admitting: Adult Health

## 2023-10-02 ENCOUNTER — Telehealth: Payer: Self-pay

## 2023-10-02 NOTE — Telephone Encounter (Signed)
Tried to call patient mom [name] and the primary number is not in service and the mobile number can not be completed at this time. Patient is needing a follow up appointment with dr. Allegra Lai to renew is [facility]. Sent mychart message to patient

## 2023-10-05 ENCOUNTER — Institutional Professional Consult (permissible substitution): Payer: Medicaid Other | Admitting: Adult Health

## 2023-10-19 ENCOUNTER — Ambulatory Visit: Payer: Medicaid Other | Attending: Cardiovascular Disease | Admitting: Cardiovascular Disease

## 2023-10-31 ENCOUNTER — Other Ambulatory Visit: Payer: Self-pay | Admitting: Gastroenterology

## 2023-10-31 ENCOUNTER — Other Ambulatory Visit (INDEPENDENT_AMBULATORY_CARE_PROVIDER_SITE_OTHER): Payer: Self-pay | Admitting: Pediatrics

## 2023-10-31 DIAGNOSIS — K5900 Constipation, unspecified: Secondary | ICD-10-CM

## 2023-11-22 ENCOUNTER — Encounter (INDEPENDENT_AMBULATORY_CARE_PROVIDER_SITE_OTHER): Payer: Self-pay

## 2023-11-22 NOTE — Progress Notes (Incomplete)
Patient: Lucas [name] MRN: [id number] Sex: male DOB: 04-23-2001  Provider: [name], MD Location of Care: Pediatric Specialist- Pediatric Complex Care Note type: Routine return visit  History of Present Illness: Referral Source: Collene Schlichter, MD  History from: patient and prior records Chief Complaint: Pediatric Complex Care  [name] is a 22 y.o. male with history of Duchenne Muscular Dystrophy, autism spectrum disorder, level 3, restrictive lung disease, compensated congestive heart failure, severe dysphagia requiring tube feeding, and obstructive sleep apnea who I am seeing in follow-up for complex care management. Patient was last seen on 05/29/2023 where I refilled AEDs, refilled prednisolone, planned on following up on adult pulmonology referral, and decreased pediasure orders.  Since that appointment, patient's mom has reached out to report that the patient was requesting food. I recommended he return to the dietician.     Patient presents today with {CHL AMB PARENT/GUARDIAN:[id number]} who reports the following:   Symptom management:  No seizures since last appointment.   Mom stays he is stable in terms of strength and contractions.  She isn't sure if they are doing stretching with him.  He has lost weight, PCP sayid no outside visits because of weight loss.  He put him back on 5 cans per day and he gained weight back.   Care coordination (other providers): [facility] multiple cancellations, problems with transportation.  Mother is asking to schedule in the spring.    Missed appointment with adult cardiologist.  He needs appointment in [place] in the spring.     Dr. Verdis Prime office with GI has also reached out to attempt to schedule a follow up appointment. Mother refuses to see them in person, but is willing to see them virtually.  Order for weight and length.  Told he can get an appointment in February.  WIlling to see Dr Loel Dubonnet, but want to do it  virtual.     Care management needs:   Equipment needs:  At the last visit, ordered plug-in electronic blood pressure cuff and a pulse oximeter.   Mom had to buy blood pressure cuff, hasn't gotten a pulse oximeter- for cardiology.    No other equipment needs.  Not using leg braces, [facility] told them he may not qualify.  He no longer has a stander a couple years ago.  Doesn't need a new wheelchair. Not sure when he needs new Hoyer lift. He is no longer using hand splints.       Decision making/Advanced care planning:  Diagnostics/Patient history:   Review of Systems: {cn system review:210120003}  Past Medical History Past Medical History:  Diagnosis Date   Autism    Autism    Community acquired pneumonia of left lower lobe of lung 03/03/2022   Diarrhea 06/09/2015   Family history of adverse reaction to anesthesia    MGGM- N/V   Muscular dystrophy (HCC)    Scoliosis    Seizures (HCC)    Last one 2013   Sepsis (HCC) 03/03/2022   Transient alteration of awareness    Viral gastroenteritis    Yeast infection of the skin 10/24/2020    Surgical History Past Surgical History:  Procedure Laterality Date   CIRCUMCISION     at birth   LAPAROSCOPIC GASTROSTOMY N/A 05/24/2017   Procedure: LAPAROSCOPIC GASTROSTOMY TUBE PLACEMENT;  Surgeon: Kandice Hams, MD;  Location: [name];  Service: General;  Laterality: N/A;   SPINE SURGERY N/A    Phreesia 06/29/2020    Family History family  history includes Alcohol abuse in his father; Asthma in an other family member; Cancer in his paternal grandfather; Dementia in his paternal grandmother; Heart failure in his maternal grandmother; Hyperlipidemia in his mother and another family member; Hypertension in his maternal grandmother and another family member; Learning disabilities in his mother; Other in his maternal aunt, maternal grandmother, maternal uncle, and mother; Vision loss in an other family member.   Social History Social  History   Social History Narrative   He lives with mother.   He graduated high school in 22.    He is not currently in a day program     Allergies Allergies  Allergen Reactions   Lisinopril Rash    Red bubbly rash   Penicillins Hives and Rash    Has patient had a PCN reaction causing immediate rash, facial/tongue/throat swelling, SOB or lightheadedness with hypotension: Yes Has patient had a PCN reaction causing severe rash involving mucus membranes or skin necrosis: Yes Has patient had a PCN reaction that required hospitalization: No Has patient had a PCN reaction occurring within the last 10 years: No If all of the above answers are "NO", then may proceed with Cephalosporin use.    Docusate Sodium     Rash and cant sleep   Multivitamins     Red in face, itchy, bumps   Other Other (See Comments)    Senkot -- gets rash and cant sleep   Aloe Vera Rash    Mom report "red little dots" after applying onto face   Calcitonin Rash   Esomeprazole Magnesium Nausea And Vomiting   Omeprazole Other (See Comments)    Constipation and possible rash after drug was stopped   Simethicone Palpitations and Rash   Sunflower Oil Itching, Rash and Other (See Comments)    GI upset - Avoid products containing this additive   Vitamin D Analogs Other (See Comments)    Rash on face if it contains sunflower seed oil Excessive urine output and thirsty, increase in gas    Medications Current Outpatient Medications on File Prior to Visit  Medication Sig Dispense Refill   acetaminophen (TYLENOL) 500 MG tablet Take 500 mg by mouth every 6 (six) hours as needed for mild pain (pain score 1-3).     carbamazepine (TEGRETOL) 100 MG chewable tablet TAKE 1 & 1/2 TABLETS BY MOUTH TWICE A DAY 270 tablet 5   nystatin (MYCOSTATIN/NYSTOP) powder Apply 1 Application topically 3 (three) times daily.     polyethylene glycol powder (GOODSENSE CLEARLAX) 17 GM/SCOOP powder TAKE 17 GM BY MOUTH TWICE DAILY AS DIRECTED WITH  PRUNE JUICE 3060 g 0   prednisoLONE (ORAPRED) 15 MG/5ML solution PLACE 3 ML INTO FEEDING TUBE DAILY 270 mL 5   acetaminophen (TYLENOL) 160 MG/5ML elixir Take 20.3 mLs (650 mg total) by mouth every 4 (four) hours as needed for pain. 120 mL 0   levalbuterol (XOPENEX) 1.25 MG/3ML nebulizer solution Take 1.25 mg by nebulization every 4 (four) hours as needed.     Nutritional Supplements (PEDIASURE GROW & GAIN) LIQD Drink 1 bottle spaced out 4 times a day 28440 mL 12   No current facility-administered medications on file prior to visit.   The medication list was reviewed and reconciled. All changes or newly prescribed medications were explained.  A complete medication list was provided to the patient/caregiver.  Physical Exam Wt 108 lb (49 kg) Comment: Mom says last known was Nov 2024  BMI 21.81 kg/m  Weight for age: Facility age limit  for growth %iles is 20 years.  Length for age: Facility age limit for growth %iles is 20 years. BMI: Body mass index is 21.81 kg/m. No results found.   Diagnosis:  1. Partial epilepsy with impairment of consciousness      Assessment and Plan OFIR GRIESINGER is a 22 y.o. male with history of Duchenne Muscular Dystrophy, autism spectrum disorder, level 3, restrictive lung disease, compensated congestive heart failure, severe dysphagia requiring tube feeding, and obstructive sleep apnea who presents for follow-up in the pediatric complex care clinic. Symptom management:     Care coordination:  Care management needs:  Evaluate, he hasn't had heart affected.   Amondys? Deletion of 46 and 47.     Equipment needs:  Due to patient's medical condition, patient is indefinitely incontinent of stool and urine.  It is medically necessary for them to use diapers, underpads, and gloves to assist with hygiene and skin integrity.  They require a frequency of up to 200 a month.   Decision making/Advanced care planning:  The CARE PLAN for reviewed and revised to  represent the changes above.  This is available in Epic under snapshot, and a physical binder provided to the patient, that can be used for anyone providing care for the patient.    I spend 55 minutes on day of service on this patient including review of chart, discussion with patient and family, coordination with other providers and management of orders and paperwork.      No follow-ups on file.  [name] MD MPH Neurology,  Neurodevelopment and Neuropalliative care [facility] Child Neurology  [address] [place], [place], [place] [address] Phone: [phone]

## 2023-11-27 ENCOUNTER — Telehealth (INDEPENDENT_AMBULATORY_CARE_PROVIDER_SITE_OTHER): Payer: Medicaid Other | Admitting: Pediatrics

## 2023-11-27 ENCOUNTER — Encounter (INDEPENDENT_AMBULATORY_CARE_PROVIDER_SITE_OTHER): Payer: Self-pay | Admitting: Pediatrics

## 2023-11-27 VITALS — Wt 108.0 lb

## 2023-11-27 DIAGNOSIS — G40209 Localization-related (focal) (partial) symptomatic epilepsy and epileptic syndromes with complex partial seizures, not intractable, without status epilepticus: Secondary | ICD-10-CM

## 2023-11-27 DIAGNOSIS — F840 Autistic disorder: Secondary | ICD-10-CM

## 2023-11-27 DIAGNOSIS — Z931 Gastrostomy status: Secondary | ICD-10-CM

## 2023-11-27 DIAGNOSIS — K5900 Constipation, unspecified: Secondary | ICD-10-CM

## 2023-11-27 DIAGNOSIS — F79 Unspecified intellectual disabilities: Secondary | ICD-10-CM

## 2023-11-27 DIAGNOSIS — G7101 Duchenne or Becker muscular dystrophy: Secondary | ICD-10-CM

## 2023-11-27 DIAGNOSIS — R0689 Other abnormalities of breathing: Secondary | ICD-10-CM | POA: Diagnosis not present

## 2023-11-27 MED ORDER — PREDNISOLONE SODIUM PHOSPHATE 15 MG/5ML PO SOLN: ORAL | 2 refills | Status: DC

## 2023-11-27 MED ORDER — CARBAMAZEPINE 100 MG PO CHEW: CHEWABLE_TABLET | ORAL | 2 refills | Status: DC

## 2023-11-27 NOTE — Patient Instructions (Addendum)
Symptom management: Increase Prednisone to 4 mL (12 mg) Continue Tegretol  Care Coordination: We will reach out to Dr. Verdis Prime office about seeing [place] virtually. We will have his nurses get his height and weight.  Ragan should see the cardiologist, the pulmonologist, and GI in the spring Kristan should go back to the [facility] and see Dr. Loel Dubonnet. We will contact them to get [place] scheduled Care management: We will provide an order for the nurse to be able to use Aquaphor for [place], for Teigan to wear hand splints, and for the nurses to stretch Forney's feet Equipment needs: Put in an order for a pulse ox. We will follow up with [facility] about it We will talk to [place] at [facility] and [employer] about the hoyer lift If [place] reaches out to [place] about formula, we will send in orders for pediatric Boost

## 2023-11-28 ENCOUNTER — Encounter (INDEPENDENT_AMBULATORY_CARE_PROVIDER_SITE_OTHER): Payer: Self-pay | Admitting: Pediatrics

## 2023-12-01 ENCOUNTER — Encounter (INDEPENDENT_AMBULATORY_CARE_PROVIDER_SITE_OTHER): Payer: Self-pay | Admitting: Pediatrics

## 2023-12-07 MED ORDER — PREDNISOLONE SODIUM PHOSPHATE 15 MG/5ML PO SOLN: ORAL | 2 refills | Status: DC

## 2023-12-07 NOTE — Addendum Note (Signed)
Addended by: Margurite Auerbach on: 12/07/2023 03:22 PM   Modules accepted: Orders

## 2023-12-12 ENCOUNTER — Encounter (INDEPENDENT_AMBULATORY_CARE_PROVIDER_SITE_OTHER): Payer: Self-pay

## 2023-12-17 ENCOUNTER — Encounter (INDEPENDENT_AMBULATORY_CARE_PROVIDER_SITE_OTHER): Payer: Self-pay | Admitting: Pediatrics

## 2023-12-20 ENCOUNTER — Telehealth (INDEPENDENT_AMBULATORY_CARE_PROVIDER_SITE_OTHER): Payer: Self-pay | Admitting: Pediatrics

## 2023-12-20 NOTE — Telephone Encounter (Signed)
  Name of who is calling: Jake  Caller's Relationship to Patient: Nurse  Best contact number: [phone]  Provider they see: Crystal  Reason for call: [facility] Nurse is calling in reference to a medication refill that has expired. He's needing a refill on emergency medication for seizures      PRESCRIPTION REFILL ONLY  Name of prescription:   Pharmacy: [facility] [place]

## 2023-12-21 NOTE — Telephone Encounter (Signed)
 Contacted patients mother. Verified patient name and DOB as well as mothers name.   Lucas [name] was present for this call. I reiterated the message from the provider to Lucas [name] and patients mother.   Lucas [name] asked if this could be relayed to his employer - [employer] - I asked Lucas [name] to send over the form needed and we will have the provider sign the form.    They both verbalized understanding of this.   SS, CCMA

## 2023-12-21 NOTE — Telephone Encounter (Signed)
 Rescue seizure medication has been discontinued because he is now an adult and has not had seizures in years.  This was previously Diastat  rectal gel. If their policies require rescue seizure medication, I can prescribe him the new Valtoco  nasal spray.  However I do not think this is necessary.    Corean Geralds MD MPH

## 2024-01-02 ENCOUNTER — Encounter (INDEPENDENT_AMBULATORY_CARE_PROVIDER_SITE_OTHER): Payer: Self-pay

## 2024-01-18 ENCOUNTER — Encounter (INDEPENDENT_AMBULATORY_CARE_PROVIDER_SITE_OTHER): Payer: Self-pay | Admitting: Pediatrics

## 2024-01-18 DIAGNOSIS — K5900 Constipation, unspecified: Secondary | ICD-10-CM

## 2024-01-18 DIAGNOSIS — G4733 Obstructive sleep apnea (adult) (pediatric): Secondary | ICD-10-CM

## 2024-01-18 DIAGNOSIS — Z931 Gastrostomy status: Secondary | ICD-10-CM

## 2024-01-30 NOTE — Telephone Encounter (Signed)
 Attempted to contact patients mother.  Mother unable to be reached.  LVM to call back.  SS, CCMA

## 2024-02-08 ENCOUNTER — Emergency Department: Payer: Medicaid Other

## 2024-02-08 ENCOUNTER — Inpatient Hospital Stay
Admission: EM | Admit: 2024-02-08 | Discharge: 2024-02-13 | DRG: 189 | Disposition: A | Discharge status: Home / Self Care | Payer: Medicaid Other | Attending: Internal Medicine | Admitting: Internal Medicine

## 2024-02-08 ENCOUNTER — Other Ambulatory Visit: Payer: Self-pay

## 2024-02-08 DIAGNOSIS — D72829 Elevated white blood cell count, unspecified: Secondary | ICD-10-CM | POA: Diagnosis present

## 2024-02-08 DIAGNOSIS — J9622 Acute and chronic respiratory failure with hypercapnia: Secondary | ICD-10-CM | POA: Diagnosis present

## 2024-02-08 DIAGNOSIS — Z821 Family history of blindness and visual loss: Secondary | ICD-10-CM

## 2024-02-08 DIAGNOSIS — I469 Cardiac arrest, cause unspecified: Secondary | ICD-10-CM | POA: Diagnosis present

## 2024-02-08 DIAGNOSIS — G7101 Duchenne or Becker muscular dystrophy: Secondary | ICD-10-CM | POA: Diagnosis present

## 2024-02-08 DIAGNOSIS — R6510 Systemic inflammatory response syndrome (SIRS) of non-infectious origin without acute organ dysfunction: Secondary | ICD-10-CM | POA: Diagnosis present

## 2024-02-08 DIAGNOSIS — F809 Developmental disorder of speech and language, unspecified: Secondary | ICD-10-CM | POA: Diagnosis present

## 2024-02-08 DIAGNOSIS — R000 Tachycardia, unspecified: Secondary | ICD-10-CM | POA: Diagnosis present

## 2024-02-08 DIAGNOSIS — Z880 Allergy status to penicillin: Secondary | ICD-10-CM

## 2024-02-08 DIAGNOSIS — M419 Scoliosis, unspecified: Secondary | ICD-10-CM | POA: Diagnosis present

## 2024-02-08 DIAGNOSIS — R0682 Tachypnea, not elsewhere classified: Secondary | ICD-10-CM

## 2024-02-08 DIAGNOSIS — E876 Hypokalemia: Secondary | ICD-10-CM | POA: Diagnosis not present

## 2024-02-08 DIAGNOSIS — E878 Other disorders of electrolyte and fluid balance, not elsewhere classified: Secondary | ICD-10-CM

## 2024-02-08 DIAGNOSIS — Z825 Family history of asthma and other chronic lower respiratory diseases: Secondary | ICD-10-CM

## 2024-02-08 DIAGNOSIS — J9621 Acute and chronic respiratory failure with hypoxia: Principal | ICD-10-CM | POA: Diagnosis present

## 2024-02-08 DIAGNOSIS — G7100 Muscular dystrophy, unspecified: Secondary | ICD-10-CM

## 2024-02-08 DIAGNOSIS — K5900 Constipation, unspecified: Principal | ICD-10-CM

## 2024-02-08 DIAGNOSIS — K5909 Other constipation: Secondary | ICD-10-CM | POA: Diagnosis present

## 2024-02-08 DIAGNOSIS — E874 Mixed disorder of acid-base balance: Secondary | ICD-10-CM | POA: Diagnosis present

## 2024-02-08 DIAGNOSIS — G4733 Obstructive sleep apnea (adult) (pediatric): Secondary | ICD-10-CM | POA: Diagnosis present

## 2024-02-08 DIAGNOSIS — Z8249 Family history of ischemic heart disease and other diseases of the circulatory system: Secondary | ICD-10-CM

## 2024-02-08 DIAGNOSIS — J984 Other disorders of lung: Secondary | ICD-10-CM

## 2024-02-08 DIAGNOSIS — E875 Hyperkalemia: Secondary | ICD-10-CM | POA: Diagnosis not present

## 2024-02-08 DIAGNOSIS — M436 Torticollis: Secondary | ICD-10-CM | POA: Diagnosis present

## 2024-02-08 DIAGNOSIS — R532 Functional quadriplegia: Secondary | ICD-10-CM | POA: Diagnosis present

## 2024-02-08 DIAGNOSIS — F840 Autistic disorder: Secondary | ICD-10-CM | POA: Diagnosis present

## 2024-02-08 DIAGNOSIS — Z83438 Family history of other disorder of lipoprotein metabolism and other lipidemia: Secondary | ICD-10-CM

## 2024-02-08 DIAGNOSIS — N200 Calculus of kidney: Secondary | ICD-10-CM | POA: Diagnosis present

## 2024-02-08 DIAGNOSIS — Z811 Family history of alcohol abuse and dependence: Secondary | ICD-10-CM

## 2024-02-08 DIAGNOSIS — Z1152 Encounter for screening for COVID-19: Secondary | ICD-10-CM

## 2024-02-08 DIAGNOSIS — J9601 Acute respiratory failure with hypoxia: Secondary | ICD-10-CM | POA: Diagnosis present

## 2024-02-08 DIAGNOSIS — Z888 Allergy status to other drugs, medicaments and biological substances status: Secondary | ICD-10-CM

## 2024-02-08 DIAGNOSIS — F79 Unspecified intellectual disabilities: Secondary | ICD-10-CM | POA: Diagnosis present

## 2024-02-08 DIAGNOSIS — Z931 Gastrostomy status: Secondary | ICD-10-CM

## 2024-02-08 DIAGNOSIS — G40909 Epilepsy, unspecified, not intractable, without status epilepticus: Secondary | ICD-10-CM

## 2024-02-08 DIAGNOSIS — R140 Abdominal distension (gaseous): Secondary | ICD-10-CM

## 2024-02-08 LAB — COMPREHENSIVE METABOLIC PANEL
ALT: 53 U/L — ABNORMAL HIGH (ref 0–44)
AST: 52 U/L — ABNORMAL HIGH (ref 15–41)
Albumin: 4.2 g/dL (ref 3.5–5.0)
Alkaline Phosphatase: 94 U/L (ref 38–126)
Anion gap: 11 (ref 5–15)
BUN: 8 mg/dL (ref 6–20)
CO2: 35 mmol/L — ABNORMAL HIGH (ref 22–32)
Calcium: 9.5 mg/dL (ref 8.9–10.3)
Chloride: 89 mmol/L — ABNORMAL LOW (ref 98–111)
Creatinine, Ser: <0.3 mg/dL — ABNORMAL LOW (ref 0.61–1.24)
Glucose, Bld: 79 mg/dL (ref 70–99)
Potassium: 4.4 mmol/L (ref 3.5–5.1)
Sodium: 135 mmol/L (ref 135–145)
Total Bilirubin: 0.6 mg/dL (ref 0.0–1.2)
Total Protein: 7.2 g/dL (ref 6.5–8.1)

## 2024-02-08 LAB — CBC WITH DIFFERENTIAL/PLATELET
Abs Immature Granulocytes: 0.05 10*3/uL (ref 0.00–0.07)
Basophils Absolute: 0 10*3/uL (ref 0.0–0.1)
Basophils Relative: 0 %
Eosinophils Absolute: 0 10*3/uL (ref 0.0–0.5)
Eosinophils Relative: 0 %
HCT: 49 % (ref 39.0–52.0)
Hemoglobin: 16.5 g/dL (ref 13.0–17.0)
Immature Granulocytes: 0 %
Lymphocytes Relative: 10 %
Lymphs Abs: 1.2 10*3/uL (ref 0.7–4.0)
MCH: 31.1 pg (ref 26.0–34.0)
MCHC: 33.7 g/dL (ref 30.0–36.0)
MCV: 92.3 fL (ref 80.0–100.0)
Monocytes Absolute: 1.5 10*3/uL — ABNORMAL HIGH (ref 0.1–1.0)
Monocytes Relative: 13 %
Neutro Abs: 8.7 10*3/uL — ABNORMAL HIGH (ref 1.7–7.7)
Neutrophils Relative %: 77 %
Platelets: 227 10*3/uL (ref 150–400)
RBC: 5.31 MIL/uL (ref 4.22–5.81)
RDW: 13.9 % (ref 11.5–15.5)
WBC: 11.5 10*3/uL — ABNORMAL HIGH (ref 4.0–10.5)
nRBC: 0 % (ref 0.0–0.2)

## 2024-02-08 MED ORDER — SODIUM CHLORIDE 0.9 % IV BOLUS
1000.0000 mL | Freq: Once | INTRAVENOUS | Status: AC
  Administered 2024-02-09: 1000 mL via INTRAVENOUS

## 2024-02-08 MED ORDER — MORPHINE SULFATE (PF) 2 MG/ML IV SOLN: 2.0000 mg | Freq: Once | INTRAVENOUS | Status: DC

## 2024-02-08 MED ORDER — IOHEXOL 300 MG/ML  SOLN
100.0000 mL | Freq: Once | INTRAMUSCULAR | Status: AC | PRN
  Administered 2024-02-08: 100 mL via INTRAVENOUS

## 2024-02-08 NOTE — ED Triage Notes (Signed)
 Patient to ED via [facility] from home due to constipation, shortness of breath, and "acting different" according to patient's mother. Patient has Hx of muscular dystrophy. Patient's mother states patient's abdomen has gotten increasingly larger than normal. She states he has not passed enough stool over the past few days. Patient's mother states the patient has been making loud respirations today and has had difficulty breathing. Patient currently has a G tube in place and has been receiving food like normal. Mother states patient uses Bi-PAP at night.   EMS vitals: 159/86 130HR 95% room air

## 2024-02-08 NOTE — ED Provider Notes (Incomplete)
 [facility] Provider Note    Event Date/Time   First MD Initiated Contact with Patient 02/08/24 2305     (approximate)   History   Constipation   HPI  Lucas [name] is a 23 y.o. 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  He just got over a GI illness of diarrhea vomiting last week and has been well for about 2 days and has been taking Metamucil through the G-tube and then got distended, passing foul-smelling flatus, small hard stool today.  No further vomiting.  He appears uncomfortable but is unable to communicate baseline, mother states that he seems to be working harder to breathe breathing faster.  Mother denies any respiratory infectious symptoms seen in her son like cough, congestion, fevers or chills.  Independent Historian contributed to assessment above: Mother gives information as above    Physical Exam   Triage Vital Signs: ED Triage Vitals  Encounter Vitals Group     BP 02/08/24 2242 (!) 129/94     Systolic BP Percentile --      Diastolic BP Percentile --      Pulse Rate 02/08/24 2242 (!) 135     Resp 02/08/24 2242 (!) 35     Temp 02/08/24 2242 (!) 97.2 F (36.2 C)     Temp Source 02/08/24 2242 Axillary     SpO2 02/08/24 2236 95 %     Weight 02/08/24 2243 136 lb (61.7 kg)     Height 02/08/24 2243 5' (1.524 m)     Head Circumference --      Peak Flow --      Pain Score --      Pain Loc --      Pain Education --      Exclude from Growth Chart --     Most recent vital signs: Vitals:   02/09/24 0000 02/09/24 0230  BP: 127/82 128/83  Pulse: (!) 142 (!) 140  Resp: (!) 28 (!) 29  Temp:  98.6 F (37 C)  SpO2: 100%  100%    General: Awake, no distress.  CV:  Good peripheral perfusion.  He is tachycardic. Resp:  Tachypneic. Abd:  Distended abdomen but without rigidity or guarding.  Lungs are clear with no wheezing rales or focality.  ED Results / Procedures / Treatments   Labs (all labs ordered are listed, but only abnormal results are displayed) Labs Reviewed  COMPREHENSIVE METABOLIC PANEL - Abnormal; Notable for the following components:      Result Value   Chloride 89 (*)    CO2 35 (*)    Creatinine, Ser <0.30 (*)    AST 52 (*)    ALT 53 (*)    All other components within normal limits  CBC WITH DIFFERENTIAL/PLATELET - Abnormal; Notable for the following components:   WBC 11.5 (*)    Neutro Abs 8.7 (*)    Monocytes Absolute 1.5 (*)    All other components within normal limits  URINALYSIS, W/ REFLEX TO CULTURE (INFECTION SUSPECTED) - Abnormal; Notable for the following components:   Color, Urine STRAW (*)    APPearance CLEAR (*)    Glucose, UA 150 (*)    All other components within normal limits  BLOOD GAS, VENOUS -  Abnormal; Notable for the following components:   pCO2, Ven 73 (*)    pO2, Ven 104 (*)    Bicarbonate 40.3 (*)    Acid-Base Excess 11.4 (*)    All other components within normal limits  RESP PANEL BY RT-PCR (RSV, FLU A&B, COVID)  RVPGX2  CULTURE, BLOOD (ROUTINE X 2)  CULTURE, BLOOD (ROUTINE X 2)  LACTIC ACID, PLASMA  LACTIC ACID, PLASMA  PROTIME-INR  APTT     I ordered and reviewed the above labs they are notable for scattered white blood cell count elevated at 11.5  EKG  ED ECG REPORT I, Pilar Jarvis, the attending physician, personally viewed and interpreted this ECG.   Date: 02/08/2024  EKG Time: 2258  Rate: 134  Rhythm: sinus tachycardia  Axis: nl  Intervals:noe  ST&T Change: no stemi    RADIOLOGY I independently reviewed and interpreted chest x-ray and see no obvious focality or pneumothorax I also reviewed radiologist's formal read.    PROCEDURES:  Critical Care performed: Yes, see critical care procedure note(s)  .Critical Care  Performed by: Pilar Jarvis, MD Authorized by: Pilar Jarvis, MD   Critical care provider statement:    Critical care time (minutes):  30   Critical care was time spent personally by me on the following activities:  Development of treatment plan with patient or surrogate, discussions with consultants, evaluation of patient's response to treatment, examination of patient, ordering and review of laboratory studies, ordering and review of radiographic studies, ordering and performing treatments and interventions, pulse oximetry, re-evaluation of patient's condition and review of old charts    MEDICATIONS ORDERED IN ED: Medications  morphine (PF) 2 MG/ML injection 2 mg (0 mg Intravenous Hold 02/09/24 0111)  vancomycin (VANCOREADY) IVPB 1250 mg/250 mL (1,250 mg Intravenous New Bag/Given 02/09/24 0232)  sodium chloride 0.9 % bolus 1,000 mL (0 mLs Intravenous Stopped 02/09/24 0318)  iohexol (OMNIPAQUE) 300 MG/ML solution 100 mL (100 mLs Intravenous Contrast Given 02/08/24 2347)  ceFEPIme (MAXIPIME) 2 g in sodium chloride 0.9 % 100 mL IVPB (0 g Intravenous Stopped 02/09/24 0223)  sodium chloride 0.9 % bolus 500 mL (0 mLs Intravenous Stopped 02/09/24 0318)    External physician / consultants:  I spoke with hospital medicine for admission and regarding care plan for this patient.   IMPRESSION / MDM / ASSESSMENT AND PLAN / ED COURSE  I reviewed the triage vital signs and the nursing notes.                                Patient's presentation is most consistent with acute presentation with potential threat to life or bodily function.  Differential diagnosis includes, but is not limited to, obstruction, constipation, intra-abdominal infection, urinary tract infection, respiratory infection, dehydration, sepsis   The patient is on the cardiac monitor to evaluate for evidence of arrhythmia and/or  significant heart rate changes.  MDM:    Departure from baseline status per mother report in this patient with muscular dystrophy and nonverbal, chiefly in his abdomen appears distended he has had small hard stools and is breathing faster than normal.  He is tachycardic and tachypneic and I wonder if this is driven by pain or sepsis.  He is afebrile.  He is a mild leukocytosis.  His abdomen is distended which would correlate for severe constipation or obstruction.  Has had no surgical history aside from his G-tube placement.  I gave IV morphine for pain  control.  I put in for sepsis labs, 1000 cc of fluid, chest x-ray, viral swabs, and a CT of the abdomen and pelvis at the onset but will withhold antibiotics for now until source is identified, as constipation or obstruction and pain could also account for his mild leukocytosis and vital sign abnormalities.   -- Workup thus far is unremarkable with no obvious infectious source as chest x-ray, viral testing, and CT abdomen pelvis did not show any acute abnormalities.  After initial pain control medication, fluid bolus, patient vital signs remained very abnormal with a pulse rate in the 140s and respiratory rate 28 and along with his leukocytosis I am concerned that this may be sepsis driven although we have not sourced anything yet.  Urine is still pending.  I added on another 500 cc of crystalloid to complete a 30 cc/kg sepsis bolus and empirically treated him with vancomycin and cefepime.     -- Urine does not appear infected.  Mother is adamant that he looks this way when his severely constipated and will resolve with enema and bowel movement.  We will give that a try.   Made a small bowel movement but otherwise unchanged clinical status.  Mother still concerned "something is not right".  VBG shows normal pH and high pCO2 with metabolic alkalosis, representative of a chronic respiratory acidosis without appropriate metabolic compensation.  I am  not sure what is going on but since his vital signs remain abnormal, and mother notes that he is still not well-appearing, will admit for further evaluation / observation.  FINAL CLINICAL IMPRESSION(S) / ED DIAGNOSES   Final diagnoses:  Constipation, unspecified constipation type  Abdominal distention  Tachypnea     Rx / DC Orders   ED Discharge Orders     None        Note:  This document was prepared using Dragon voice recognition software and may include unintentional dictation errors.    Pilar Jarvis, MD 02/09/24 Retta Diones    Pilar Jarvis, MD 02/09/24 [phone]

## 2024-02-08 NOTE — Addendum Note (Signed)
 Addended by: Margurite Auerbach on: 02/08/2024 02:33 PM   Modules accepted: Orders

## 2024-02-09 DIAGNOSIS — E874 Mixed disorder of acid-base balance: Secondary | ICD-10-CM | POA: Diagnosis present

## 2024-02-09 DIAGNOSIS — F840 Autistic disorder: Secondary | ICD-10-CM | POA: Diagnosis present

## 2024-02-09 DIAGNOSIS — E875 Hyperkalemia: Secondary | ICD-10-CM | POA: Diagnosis not present

## 2024-02-09 DIAGNOSIS — Z811 Family history of alcohol abuse and dependence: Secondary | ICD-10-CM | POA: Diagnosis not present

## 2024-02-09 DIAGNOSIS — Z888 Allergy status to other drugs, medicaments and biological substances status: Secondary | ICD-10-CM | POA: Diagnosis not present

## 2024-02-09 DIAGNOSIS — J9621 Acute and chronic respiratory failure with hypoxia: Secondary | ICD-10-CM | POA: Diagnosis present

## 2024-02-09 DIAGNOSIS — G40909 Epilepsy, unspecified, not intractable, without status epilepticus: Secondary | ICD-10-CM

## 2024-02-09 DIAGNOSIS — R6510 Systemic inflammatory response syndrome (SIRS) of non-infectious origin without acute organ dysfunction: Secondary | ICD-10-CM | POA: Diagnosis present

## 2024-02-09 DIAGNOSIS — K5900 Constipation, unspecified: Secondary | ICD-10-CM

## 2024-02-09 DIAGNOSIS — R000 Tachycardia, unspecified: Secondary | ICD-10-CM

## 2024-02-09 DIAGNOSIS — F79 Unspecified intellectual disabilities: Secondary | ICD-10-CM | POA: Diagnosis present

## 2024-02-09 DIAGNOSIS — J9602 Acute respiratory failure with hypercapnia: Secondary | ICD-10-CM

## 2024-02-09 DIAGNOSIS — R0682 Tachypnea, not elsewhere classified: Secondary | ICD-10-CM | POA: Diagnosis present

## 2024-02-09 DIAGNOSIS — J9601 Acute respiratory failure with hypoxia: Secondary | ICD-10-CM | POA: Diagnosis present

## 2024-02-09 DIAGNOSIS — G7101 Duchenne or Becker muscular dystrophy: Secondary | ICD-10-CM

## 2024-02-09 DIAGNOSIS — R140 Abdominal distension (gaseous): Secondary | ICD-10-CM

## 2024-02-09 DIAGNOSIS — D72829 Elevated white blood cell count, unspecified: Secondary | ICD-10-CM | POA: Diagnosis present

## 2024-02-09 DIAGNOSIS — K5909 Other constipation: Secondary | ICD-10-CM

## 2024-02-09 DIAGNOSIS — Z931 Gastrostomy status: Secondary | ICD-10-CM | POA: Diagnosis not present

## 2024-02-09 DIAGNOSIS — Z825 Family history of asthma and other chronic lower respiratory diseases: Secondary | ICD-10-CM | POA: Diagnosis not present

## 2024-02-09 DIAGNOSIS — Z1152 Encounter for screening for COVID-19: Secondary | ICD-10-CM | POA: Diagnosis not present

## 2024-02-09 DIAGNOSIS — G7100 Muscular dystrophy, unspecified: Secondary | ICD-10-CM

## 2024-02-09 DIAGNOSIS — J984 Other disorders of lung: Secondary | ICD-10-CM | POA: Diagnosis present

## 2024-02-09 DIAGNOSIS — I469 Cardiac arrest, cause unspecified: Secondary | ICD-10-CM | POA: Diagnosis present

## 2024-02-09 DIAGNOSIS — G4733 Obstructive sleep apnea (adult) (pediatric): Secondary | ICD-10-CM | POA: Diagnosis present

## 2024-02-09 DIAGNOSIS — R532 Functional quadriplegia: Secondary | ICD-10-CM | POA: Diagnosis present

## 2024-02-09 DIAGNOSIS — Z821 Family history of blindness and visual loss: Secondary | ICD-10-CM | POA: Diagnosis not present

## 2024-02-09 DIAGNOSIS — J9622 Acute and chronic respiratory failure with hypercapnia: Secondary | ICD-10-CM | POA: Diagnosis present

## 2024-02-09 DIAGNOSIS — E876 Hypokalemia: Secondary | ICD-10-CM | POA: Diagnosis not present

## 2024-02-09 DIAGNOSIS — F809 Developmental disorder of speech and language, unspecified: Secondary | ICD-10-CM | POA: Diagnosis present

## 2024-02-09 DIAGNOSIS — Z880 Allergy status to penicillin: Secondary | ICD-10-CM | POA: Diagnosis not present

## 2024-02-09 DIAGNOSIS — Z8249 Family history of ischemic heart disease and other diseases of the circulatory system: Secondary | ICD-10-CM | POA: Diagnosis not present

## 2024-02-09 LAB — URINALYSIS, W/ REFLEX TO CULTURE (INFECTION SUSPECTED)
Bacteria, UA: NONE SEEN
Bilirubin Urine: NEGATIVE
Glucose, UA: 150 mg/dL — AB
Hgb urine dipstick: NEGATIVE
Ketones, ur: NEGATIVE mg/dL
Leukocytes,Ua: NEGATIVE
Nitrite: NEGATIVE
Protein, ur: NEGATIVE mg/dL
Specific Gravity, Urine: 1.016 (ref 1.005–1.030)
Squamous Epithelial / HPF: 0 /[HPF] (ref 0–5)
pH: 7 (ref 5.0–8.0)

## 2024-02-09 LAB — BLOOD GAS, ARTERIAL
Acid-Base Excess: 12.6 mmol/L — ABNORMAL HIGH (ref 0.0–2.0)
Bicarbonate: 40.6 mmol/L — ABNORMAL HIGH (ref 20.0–28.0)
Delivery systems: POSITIVE
FIO2: 28 %
O2 Saturation: 91.8 %
Patient temperature: 37
pCO2 arterial: 67 mm[Hg] (ref 32–48)
pH, Arterial: 7.39 (ref 7.35–7.45)
pO2, Arterial: 56 mm[Hg] — ABNORMAL LOW (ref 83–108)

## 2024-02-09 LAB — BLOOD GAS, VENOUS
Acid-Base Excess: 11.4 mmol/L — ABNORMAL HIGH (ref 0.0–2.0)
Bicarbonate: 40.3 mmol/L — ABNORMAL HIGH (ref 20.0–28.0)
O2 Saturation: 99.8 %
Patient temperature: 37
pCO2, Ven: 73 mm[Hg] (ref 44–60)
pH, Ven: 7.35 (ref 7.25–7.43)
pO2, Ven: 104 mm[Hg] — ABNORMAL HIGH (ref 32–45)

## 2024-02-09 LAB — PROTIME-INR
INR: 1 (ref 0.8–1.2)
Prothrombin Time: 13.1 s (ref 11.4–15.2)

## 2024-02-09 LAB — RESP PANEL BY RT-PCR (RSV, FLU A&B, COVID)  RVPGX2
Influenza A by PCR: NEGATIVE
Influenza B by PCR: NEGATIVE
Resp Syncytial Virus by PCR: NEGATIVE
SARS Coronavirus 2 by RT PCR: NEGATIVE

## 2024-02-09 LAB — APTT: aPTT: 27 s (ref 24–36)

## 2024-02-09 LAB — LACTIC ACID, PLASMA
Lactic Acid, Venous: 0.7 mmol/L (ref 0.5–1.9)
Lactic Acid, Venous: 1 mmol/L (ref 0.5–1.9)

## 2024-02-09 LAB — TSH: TSH: 0.874 u[IU]/mL (ref 0.350–4.500)

## 2024-02-09 LAB — D-DIMER, QUANTITATIVE: D-Dimer, Quant: <0.27 ug{FEU}/mL (ref 0.00–0.50)

## 2024-02-09 LAB — HIV ANTIBODY (ROUTINE TESTING W REFLEX): HIV Screen 4th Generation wRfx: NONREACTIVE

## 2024-02-09 LAB — PROCALCITONIN: Procalcitonin: <0.1 ng/mL

## 2024-02-09 MED ORDER — ACETAMINOPHEN 160 MG/5ML PO SUSP: 640.0000 mg | ORAL | Status: DC | PRN

## 2024-02-09 MED ORDER — FAMOTIDINE IN NACL 20-0.9 MG/50ML-% IV SOLN
20.0000 mg | Freq: Two times a day (BID) | INTRAVENOUS | Status: AC
  Administered 2024-02-09 – 2024-02-10 (×4): 20 mg via INTRAVENOUS
  Filled 2024-02-09 (×4): qty 50

## 2024-02-09 MED ORDER — LACTATED RINGERS IV SOLN
150.0000 mL/h | INTRAVENOUS | Status: DC
  Administered 2024-02-09: 150 mL/h via INTRAVENOUS

## 2024-02-09 MED ORDER — LEVALBUTEROL HCL 1.25 MG/0.5ML IN NEBU: 1.2500 mg | INHALATION_SOLUTION | RESPIRATORY_TRACT | Status: DC | PRN

## 2024-02-09 MED ORDER — SODIUM CHLORIDE 0.9 % IV BOLUS
500.0000 mL | Freq: Once | INTRAVENOUS | Status: AC
  Administered 2024-02-09: 500 mL via INTRAVENOUS

## 2024-02-09 MED ORDER — LACTULOSE 10 GM/15ML PO SOLN
30.0000 g | Freq: Once | ORAL | Status: AC
  Administered 2024-02-09: 30 g
  Filled 2024-02-09: qty 60

## 2024-02-09 MED ORDER — POLYETHYLENE GLYCOL 3350 17 G PO PACK: 17.0000 g | PACK | Freq: Every day | ORAL | Status: DC

## 2024-02-09 MED ORDER — ENOXAPARIN SODIUM 40 MG/0.4ML IJ SOSY
40.0000 mg | PREFILLED_SYRINGE | INTRAMUSCULAR | Status: DC
  Administered 2024-02-09 – 2024-02-12 (×4): 40 mg via SUBCUTANEOUS
  Filled 2024-02-09 (×4): qty 0.4

## 2024-02-09 MED ORDER — DIPHENHYDRAMINE HCL 50 MG/ML IJ SOLN
25.0000 mg | Freq: Four times a day (QID) | INTRAMUSCULAR | Status: DC | PRN
  Administered 2024-02-09: 25 mg via INTRAVENOUS
  Filled 2024-02-09: qty 1

## 2024-02-09 MED ORDER — FREE WATER
100.0000 mL | Freq: Every day | Status: DC
  Administered 2024-02-09 – 2024-02-10 (×4): 100 mL

## 2024-02-09 MED ORDER — ONDANSETRON HCL 4 MG/2ML IJ SOLN: 4.0000 mg | Freq: Four times a day (QID) | INTRAMUSCULAR | Status: DC | PRN

## 2024-02-09 MED ORDER — DILTIAZEM HCL 30 MG PO TABS
30.0000 mg | ORAL_TABLET | Freq: Four times a day (QID) | ORAL | Status: DC
  Administered 2024-02-09 – 2024-02-13 (×17): 30 mg
  Filled 2024-02-09 (×18): qty 1

## 2024-02-09 MED ORDER — SODIUM CHLORIDE 0.9 % IV SOLN
2.0000 g | Freq: Once | INTRAVENOUS | Status: AC
  Administered 2024-02-09: 2 g via INTRAVENOUS
  Filled 2024-02-09: qty 12.5

## 2024-02-09 MED ORDER — METOPROLOL TARTRATE 25 MG PO TABS
25.0000 mg | ORAL_TABLET | Freq: Two times a day (BID) | ORAL | Status: DC
  Administered 2024-02-09 – 2024-02-10 (×3): 25 mg
  Filled 2024-02-09 (×3): qty 1

## 2024-02-09 MED ORDER — POLYETHYLENE GLYCOL 3350 17 G PO PACK
17.0000 g | PACK | Freq: Every day | ORAL | Status: DC
  Administered 2024-02-11 – 2024-02-13 (×3): 17 g
  Filled 2024-02-09 (×3): qty 1

## 2024-02-09 MED ORDER — AZITHROMYCIN 250 MG PO TABS
250.0000 mg | ORAL_TABLET | Freq: Every day | ORAL | Status: DC
  Administered 2024-02-10: 250 mg
  Filled 2024-02-09: qty 1

## 2024-02-09 MED ORDER — NON FORMULARY: 237.0000 mL | Freq: Every day | Status: DC

## 2024-02-09 MED ORDER — ENOXAPARIN SODIUM 40 MG/0.4ML IJ SOSY: 40.0000 mg | PREFILLED_SYRINGE | INTRAMUSCULAR | Status: DC

## 2024-02-09 MED ORDER — METOPROLOL TARTRATE 5 MG/5ML IV SOLN
5.0000 mg | INTRAVENOUS | Status: DC | PRN
  Filled 2024-02-09: qty 5

## 2024-02-09 MED ORDER — SODIUM CHLORIDE 0.9 % IV SOLN
2.0000 g | INTRAVENOUS | Status: DC
  Administered 2024-02-09 – 2024-02-10 (×2): 2 g via INTRAVENOUS
  Filled 2024-02-09 (×2): qty 20

## 2024-02-09 MED ORDER — AZITHROMYCIN 250 MG PO TABS
500.0000 mg | ORAL_TABLET | Freq: Every day | ORAL | Status: AC
  Administered 2024-02-09: 500 mg
  Filled 2024-02-09: qty 2

## 2024-02-09 MED ORDER — ENOXAPARIN SODIUM 60 MG/0.6ML IJ SOSY
60.0000 mg | PREFILLED_SYRINGE | Freq: Once | INTRAMUSCULAR | Status: AC
  Administered 2024-02-09: 60 mg via SUBCUTANEOUS
  Filled 2024-02-09: qty 0.6

## 2024-02-09 MED ORDER — VANCOMYCIN HCL IN DEXTROSE 1-5 GM/200ML-% IV SOLN
1000.0000 mg | Freq: Two times a day (BID) | INTRAVENOUS | Status: DC
  Filled 2024-02-09: qty 200

## 2024-02-09 MED ORDER — CARBAMAZEPINE 100 MG PO CHEW
100.0000 mg | CHEWABLE_TABLET | Freq: Two times a day (BID) | ORAL | Status: DC
  Administered 2024-02-09 – 2024-02-13 (×9): 100 mg
  Filled 2024-02-09 (×11): qty 1

## 2024-02-09 MED ORDER — PEDIASURE ENTERAL 1.0 CAL EN LIQD
237.0000 mL | Freq: Every day | ENTERAL | Status: DC
  Administered 2024-02-10 (×3): 237 mL
  Filled 2024-02-09 (×6): qty 237

## 2024-02-09 MED ORDER — SODIUM CHLORIDE 0.9 % IV SOLN
2.0000 g | Freq: Three times a day (TID) | INTRAVENOUS | Status: DC
  Filled 2024-02-09: qty 12.5

## 2024-02-09 MED ORDER — VANCOMYCIN HCL 1250 MG/250ML IV SOLN
1250.0000 mg | Freq: Once | INTRAVENOUS | Status: AC
  Administered 2024-02-09: 1250 mg via INTRAVENOUS
  Filled 2024-02-09: qty 250

## 2024-02-09 MED ORDER — ONDANSETRON HCL 4 MG PO TABS: 4.0000 mg | ORAL_TABLET | Freq: Four times a day (QID) | ORAL | Status: DC | PRN

## 2024-02-09 MED ORDER — LACTULOSE 10 GM/15ML PO SOLN
30.0000 g | Freq: Once | ORAL | Status: AC
  Administered 2024-02-09: 30 g via ORAL
  Filled 2024-02-09: qty 60

## 2024-02-09 MED ORDER — METRONIDAZOLE 500 MG/100ML IV SOLN
500.0000 mg | Freq: Two times a day (BID) | INTRAVENOUS | Status: DC
  Administered 2024-02-09: 500 mg via INTRAVENOUS
  Filled 2024-02-09: qty 100

## 2024-02-09 NOTE — Assessment & Plan Note (Signed)
 Autism with intellectual disability Functional quadriplegia Supportive care

## 2024-02-09 NOTE — Progress Notes (Signed)
 Patient arrieved to the floor at 0630. VS WNL. No S/S of distress at this time. Mother at bedside. Will endorse new admit and assessment Roselyn Reef  7A-7P  RN

## 2024-02-09 NOTE — TOC Initial Note (Addendum)
 Transition of Care [facility]) - Initial/Assessment Note    Patient Details  Name: Lucas [name] MRN: [id number] Date of Birth: [birth year]  Transition of Care [facility]) CM/SW Contact:    Liliana Cline, LCSW Phone Number: 02/09/2024, 11:44 AM  Clinical Narrative:                 CSW met with patient's Mother, [name], at bedside. Patient lives with [name]. Patient has a PCA through Medicaid. PCP is Dr. Larwance Sachs. [facility]acy is [facility]. Patient uses Medicaid transportation for appointments. [name] states her cousin and his wife provide support when needed for help with things like errands. [name] states patient has a Bipap, percussion vest, feeding tube, and manual wheelchair at home. Confirmed home address in chart is correct. Patient will need EMS transport home when he is discharged.  3:10- Called Lupita Leash with [employer] [phone]) per her request. They provide private duty RN services to patient 84 hours per week. Lupita Leash requests DC Summary be faxed to [phone]. Informed her of possible DC on Sunday per MD. Lupita Leash also confirmed patient already has a Bipap at home.  Expected Discharge Plan: Home w Home Health Services Barriers to Discharge: Continued Medical Work up   Patient Goals and CMS Choice Patient states their goals for this hospitalization and ongoing recovery are:: home with mother CMS Medicare.gov Compare Post Acute Care list provided to:: Patient Represenative (must comment) Choice offered to / list presented to : Parent      Expected Discharge Plan and Services       Living arrangements for the past 2 months: Single Family Home                                      Prior Living Arrangements/Services Living arrangements for the past 2 months: Single Family Home Lives with:: Parents Patient language and need for interpreter reviewed:: Yes Do you feel safe going back to the place where you live?: Yes      Need for Family Participation in Patient Care: Yes  (Comment) Care giver support system in place?: Yes (comment) Current home services: DME, Homehealth aide Criminal Activity/Legal Involvement Pertinent to Current Situation/Hospitalization: No - Comment as needed  Activities of Daily Living      Permission Sought/Granted Permission sought to share information with : Facility Industrial/product designer granted to share information with : Yes, Verbal Permission Granted (by mother [name])     Permission granted to share info w AGENCY: as needed        Emotional Assessment         Alcohol / Substance Use: Not Applicable Psych Involvement: No (comment)  Admission diagnosis:  Tachypnea [R06.82] Abdominal distention [R14.0] SIRS (systemic inflammatory response syndrome) (HCC) [R65.10] Constipation, unspecified constipation type [K59.00] Acute respiratory failure with hypoxia and hypercapnia (HCC) [J96.01, J96.02] Patient Active Problem List   Diagnosis Date Noted   SIRS (systemic inflammatory response syndrome) (HCC) 02/09/2024   Epilepsy (HCC) 02/09/2024   Abdominal distention 02/09/2024   Acute respiratory failure with hypoxia and hypercapnia (HCC) 02/09/2024   Lobar pneumonia (HCC) 11/19/2022   Sinus tachycardia 03/03/2022   Ineffective airway clearance 10/29/2021   Feeding by G-tube (HCC) 03/18/2019   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   Urinary retention 11/01/2018   Obstructive sleep apnea treated with bilevel positive airway pressure (BiPAP) 10/03/2017   Failure to thrive in pediatric patient 05/24/2017  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 [place] [address] Phone: [phone] Fax: [phone]  [facility] [address] [place] [address] Phone: [phone] Fax: [phone]     Social Drivers of Health (SDOH) Social History: SDOH Screenings   Tobacco Use: Medium Risk (12/17/2023)  Health Literacy: Low Risk  (03/21/2021)   Received from [facility], [facility]   SDOH Interventions:     Readmission Risk Interventions     No data to display

## 2024-02-09 NOTE — ED Notes (Signed)
 Pt placed on 2L [place] due to spo2 89-90% on RA.

## 2024-02-09 NOTE — Hospital Course (Signed)
 Lucas [name]

## 2024-02-09 NOTE — Assessment & Plan Note (Signed)
 Continue carbamazepine

## 2024-02-09 NOTE — Progress Notes (Signed)
 Initial Nutrition Assessment  DOCUMENTATION CODES:   Not applicable  INTERVENTION:   -TF via g-tube:   1 carton (237 ml) Pediasure Enteral 1.0 5 times daily  50 ml free water flush before and after each feeding  Tube feeding regimen provides 1200 kcal (100% of needs), 35 grams of protein, and 1000 ml of H2O. Total free water: 1500  NUTRITION DIAGNOSIS:   Inadequate oral intake related to dysphagia as evidenced by NPO status.  GOAL:   Patient will meet greater than or equal to 90% of their needs  MONITOR:   TF tolerance  REASON FOR ASSESSMENT:   Consult Enteral/tube feeding initiation and management  ASSESSMENT:   Pt with medical history significant for Duchenne's muscular dystrophy, autism with intellectual disability, seizure disorder, obstructive and restrictive lung disease BiPAP, chronic constipation on chronic laxatives, G-tube dependent, last hospitalized in December 2023 with sepsis secondary to pneumonia, who was brought in due to concern for abdominal distention noticed in the past 1 day PTA.  Pt admitted with SIRS.   Reviewed I/O's: +632 ml x 24 hours  UOP: 1.2 L x 24 hours  Case discussed with MD, pharmacist, and RN. Pt is currently on Bi-pap. Pt is familiar to this RD from prior admission. Pt takes Pediasure Grow and Gain formula PTA and mom refuses substitutions, stating he will not tolerate an adult formula. Per MD, pt will likely discharge over the weekend. Informed team that formula is not in stock at [facility] and will discuss with pharmacy purchasing regarding next steps to obtain.   Spoke with pt mother at bedside. She confirms that pt is NPO and receives TF via g-tube for sole source nutrition. Confirms home TF regimen of 1 carton  Pediasure Grow and Gain 5 times daily and 50 ml free water flush before and after each feeding administration. Regimen provides 1200 kcals and 35 grams protein. Per mom, feedings were increased in 11/2022 per visit with  outpatient RD due to concern for weight loss. Per mom, pt has gained weight since last TF change. Noted wt gain since 11/27/23.   Per mom, she does not want to substitute with available formula in stock. RD discussed with mom that home formula is not available; RD will try order some, but unsure when it will be delivered. Inquired about possibility of bringing home supply; mom reports she is unsure if she will be able to do this.   Pt's home DME agency is [employer]. Confirmed above home regimen- home formula is Pediasure Grow and Gain without Fiber.   Case discussed with pharmacy and pediatric RD; plan to courier over Pediasure Enteral 1.0 over the weekend for use. Per pediatric RD, this is the most comparable formula available on formulary.   Medications reviewed and include cardizem, lovenox, morphine, and miralax.   Labs reviewed: CBGS: 176 (inpatient orders for glycemic control are none).    NUTRITION - FOCUSED PHYSICAL EXAM:  Flowsheet Row Most Recent Value  Orbital Region No depletion  Upper Arm Region No depletion  Thoracic and Lumbar Region No depletion  Buccal Region No depletion  Temple Region No depletion  Clavicle Bone Region No depletion  Clavicle and Acromion Bone Region No depletion  Scapular Bone Region No depletion  Dorsal Hand No depletion  Patellar Region No depletion  Anterior Thigh Region No depletion  Posterior Calf Region No depletion  Edema (RD Assessment) None  Hair Reviewed  Eyes Reviewed  Mouth Reviewed  Skin Reviewed  Nails Reviewed  Diet Order:   Diet Order             Diet NPO time specified  Diet effective now                   EDUCATION NEEDS:   Education needs have been addressed  Skin:  Skin Assessment: Reviewed RN Assessment  Last BM:  Unknown  Height:   Ht Readings from Last 1 Encounters:  02/08/24 5' (1.524 m)    Weight:   Wt Readings from Last 1 Encounters:  02/08/24 61.7 kg    Ideal Body Weight:  45.5  kg  BMI:  Body mass index is 26.56 kg/m.  Estimated Nutritional Needs:   Kcal:  1250-1450  Protein:  70-85 grams  Fluid:  > 1.2 L    Levada Schilling, RD, LDN, CDCES Registered Dietitian III Certified Diabetes Care and Education Specialist If unable to reach this RD, please use "RD Inpatient" group chat on secure chat between hours of 8am-4 pm daily

## 2024-02-09 NOTE — Progress Notes (Signed)
 Pharmacy Antibiotic Note  Lucas [name] is a 23 y.o. male admitted on 02/08/2024 with sepsis.  Pharmacy has been consulted for Vancomycin Cefepime dosing.  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   Diphenhydramine and famotidine ordered.  Will need double infusion time for subsequent doses.   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  SrCr on 2/28 = < 0.3.   Will use SrCr value of 0.8 for initial calculations of CrCl but may need to dose by levels.   Plan: Cefepime 2 gm IV Q8H ordered to resume on 2/28 @ 1000.   Vancomycin 1250 mg IV X 1 given in ED on 2/28 @ 0232.  Vancomycin  1 gm IV Q12H ordered to start on 2/28 @ 1430.    AUC = 503.5 Vanc trough = 14.0  - Vanc 1 gm IV Q12H ordered to infuse over 2 hrs due to redman's syndrome -   Height: 5' (152.4 cm) Weight: 61.7 kg (136 lb) IBW/kg (Calculated) : 50  Temp (24hrs), Avg:97.9 F (36.6 C), Min:97.2 F (36.2 C), Max:98.6 F (37 C)  Recent Labs  Lab 02/08/24 2300 02/08/24 2334 02/09/24 0057  WBC 11.5*  --   --   CREATININE <0.30*  --   --   LATICACIDVEN  --  1.0 0.7    CrCl cannot be calculated (This lab value cannot be used to calculate CrCl because it is not a number: <0.30).    Allergies  Allergen Reactions   Lisinopril Rash    Red bubbly rash   Penicillins Hives and Rash    Has patient had a PCN reaction causing immediate rash, facial/tongue/throat swelling, SOB or lightheadedness with hypotension: Yes Has patient had a PCN reaction causing severe rash involving mucus membranes or skin necrosis: Yes Has patient had a PCN reaction that required hospitalization: No Has patient had a PCN reaction occurring within the last 10 years: No If all of the above answers are "NO", then may proceed with Cephalosporin use.    Docusate Sodium     Rash and cant sleep   Multivitamins     Red in face, itchy, bumps   Other Other (See  Comments)    Senkot -- gets rash and cant sleep   Aloe Vera Rash    Mom report "red little dots" after applying onto face   Calcitonin Rash   Esomeprazole Magnesium Nausea And Vomiting   Omeprazole Other (See Comments)    Constipation and possible rash after drug was stopped   Simethicone Palpitations and Rash   Sunflower Oil Itching, Rash and Other (See Comments)    GI upset - Avoid products containing this additive   Vitamin D Analogs Other (See Comments)    Rash on face if it contains sunflower seed oil Excessive urine output and thirsty, increase in gas    Antimicrobials this admission:   >>    >>   Dose adjustments this admission:   Microbiology results:  BCx:   UCx:    Sputum:    MRSA PCR:   Thank you for allowing pharmacy to be a part of this patient's care.  Davonte Siebenaler D 02/09/2024 5:09 AM

## 2024-02-09 NOTE — Assessment & Plan Note (Addendum)
 Abdominal distention Gastrostomy status CT abdomen and pelvis was unrevealing 2 doses of lactulose today.

## 2024-02-09 NOTE — Assessment & Plan Note (Addendum)
 Improved D-dimer negative which goes against pulmonary embolism.   Continue with metoprolol and Cardizem.

## 2024-02-09 NOTE — Hospital Course (Addendum)
 23 year old man past medical history of Duchenne's muscular dystrophy, autism with intellectual disability, seizure disorder, obstructive and restrictive lung disease on BiPAP at night, chronic constipation, G-tube dependent brought in for abdominal distention and constipation.  Patient also tachypneic and tachycardic.  CT scan of the abdomen did not show any acute infectious process.  D-dimer was negative.  Patient was started on aggressive antibiotics.  Patient did have a bowel movement with enema.  2/28.  Downgraded antibiotics to Rocephin and Zithromax.  Will use lactulose twice today for bowel movement.  Started urgently this morning on BiPAP with CO2 retention of 73 and patient being lethargic.  3/1: Potassium 2.3 with magnesium of 1.5-replating electrolytes.  Mentation seems to be at baseline now.  Chest x-ray without any infiltrate.  Remained tachycardic so metoprolol dose was increased.  Discontinuing antibiotic. Patient likely needs some oxygen with BiPAP.  Discussed with mother to use BiPAP for few hours during the day.  Mother to discuss with neurology as he was already told that he is approaching a trach and ventilatory support due to weakening diaphragm.  Patient will follow-up with his neurologist for further recommendation.  3/2: Patient had unexpected respiratory arrest requiring CPR in the late afternoon of 3/1.  Recovered quickly.  Very conflicting potassium, it was 2.3  in the morning and was replaced with 60 mEq through PEG tube, after CPR labs were redrawn and they came back more than 7.5 followed by 6.5 on repeat.  Patient received hyperkalemia management.  Pharmacy was consulted to monitor his electrolytes carefully.  Likely a lab error as there is very significant variation which does not make sense.  3/3: Patient is again at baseline, weaning off from oxygen as he was on 2 L of oxygen before his respiratory arrest on 3/1.  Patient likely need BiPAP during night and while taking  naps during the day.  Repeat VBG yesterday with normal pCO2. Mother again asked about putting a trach in.  Patient appears stable and comfortable, there is no emergent need for tracheostomy at this time but patient likely will approach to that point due to his progressive Duchenne muscular dystrophy.  Discussed with pulmonology. That better be done by his neurologist and outpatient ENT. Restarting tube feed.  3/4: Patient remained hemodynamically stable.  He was saturating well on room air.  He is being given home oxygen to use as needed.  Mother was instructed to use BiPAP during night and while taking naps during day to prevent buildup of CO2 and follow-up with his neurologist for further recommendations.  Patient was also given metoprolol to help with sinus tachycardia.  He will continue the rest of his care and medications and follow-up with his providers for further assistance.

## 2024-02-09 NOTE — Progress Notes (Signed)
 ED Pharmacy Antibiotic Sign Off An antibiotic consult was received from an ED provider for Vancomycin per pharmacy dosing for sepsis. A chart review was completed to assess appropriateness.   The following one time order(s) were placed:  Vancomycin 1250 mg IV X 1  Further antibiotic and/or antibiotic pharmacy consults should be ordered by the admitting provider if indicated.   Thank you for allowing pharmacy to be a part of this patient's care.   Scherrie Gerlach, [facility]  Clinical Pharmacist 02/09/24 1:20 AM

## 2024-02-09 NOTE — Progress Notes (Addendum)
 Progress Note   Patient: Lucas [name] EAV:[id number] DOB: 01-Oct-2001 DOA: 02/08/2024     0 DOS: the patient was seen and examined on 02/09/2024   Brief hospital course: 23 year old man past medical history of Duchenne's muscular dystrophy, autism with intellectual disability, seizure disorder, obstructive and restrictive lung disease on BiPAP at night, chronic constipation, G-tube dependent brought in for abdominal distention and constipation.  Patient also tachypneic and tachycardic.  CT scan of the abdomen did not show any acute infectious process.  D-dimer was negative.  Patient was started on aggressive antibiotics.  Patient did have a bowel movement with enema.  2/28.  Downgraded antibiotics to Rocephin and Zithromax.  Will use lactulose twice today for bowel movement.  Started urgently this morning on BiPAP with CO2 retention of 73 and patient being lethargic.   Assessment and Plan: * Acute respiratory failure with hypoxia and hypercapnia (HCC) With CO2 73 and I ordered BiPAP stat this morning.  Will recheck an ABG.  Patient's mother states that he does have a BiPAP at home.  SIRS (systemic inflammatory response syndrome) (HCC) I do not have a source of infection.  Patient had tachycardia and tachypnea and slight leukocytosis.  Downgraded antibiotics to Rocephin and Zithromax.  Procalcitonin is negative.  Repeat chest x-ray tomorrow morning.  CT scan of the abdomen does not show infectious source.  Urine analysis also negative.  Sinus tachycardia Heart rate up to the 140s D-dimer negative which goes against pulmonary embolism.  Patient ordered sepsis fluids but I am not sure if we have an infection.  Started on metoprolol and Cardizem.  Chronic constipation Abdominal distention Gastrostomy status CT abdomen and pelvis was unrevealing 2 doses of lactulose today.    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        Subjective: Patient opens eyes to stimulation.  ABG this morning showed a pCO2 of 73.  Started on BiPAP.  Patient's mother states that he has a BiPAP at home and has underlying sleep apnea.  Initially came in with constipation and abdominal distention.  Had a bowel movement with an enema.  Found to have tachycardia and tachypnea and acute hypoxic respiratory failure.  Physical Exam: Vitals:   02/09/24 0939 02/09/24 0940 02/09/24 1149 02/09/24 1300  BP: 103/62  (!) 127/98   Pulse: (!) 120 (!) 122 (!) 134   Resp:  20  (!) 22  Temp: (!) 97.4 F (36.3 C)  97.9 F (36.6 C)   TempSrc: Oral  Oral   SpO2: (!) 89% 90% 90%   Weight:      Height:       Physical Exam HENT:     Head: Normocephalic.     Mouth/Throat:     Pharynx: No oropharyngeal exudate.  Eyes:     General: Lids are normal.     Conjunctiva/sclera: Conjunctivae normal.  Cardiovascular:     Rate and Rhythm: Regular rhythm. Tachycardia present.     Heart sounds: Normal heart sounds, S1 normal and S2 normal.  Pulmonary:     Breath sounds: Examination of the right-lower field reveals decreased breath sounds. Examination of the left-lower field reveals  decreased breath sounds. Decreased breath sounds present. No wheezing, rhonchi or rales.  Abdominal:     Palpations: Abdomen is soft.     Tenderness: There is no abdominal tenderness.  Musculoskeletal:     Right ankle: No swelling.     Left ankle: No swelling.     Comments: Legs contracted.  Skin:    General: Skin is warm.     Findings: No rash.  Neurological:     Mental Status: He is  lethargic.     Comments: Opens eyes to stimulation     Data Reviewed: Procalcitonin negative lactic acid negative, white blood cell count 11.5, hemoglobin 16.5, platelet count 227, AST and ALT slightly elevated at 52 and 53, creatinine 0.3, CO2 35 ABG shows a pH of 7.35 pCO2 of 73, pO2 of 104 O2 saturation 99.3 I was a venous blood gas  Family Communication: Spoke with mother at the bedside  Disposition: Status is: Inpatient Remains inpatient appropriate because: I started the patient on BiPAP this morning to blow off carbon dioxide with altered mental status.  Downgraded antibiotics because am not sure if there is infection.  Repeat chest x-ray tomorrow.  Planned Discharge Destination: Home with Home Health    Time spent: 30 minutes, case discussed with nursing staff and respiratory therapy to place urgent BiPAP.  Patient critically ill.  Author: Alford Highland, MD 02/09/2024 3:52 PM  For on call review [email or web address].

## 2024-02-09 NOTE — Assessment & Plan Note (Signed)
 Lucas [name]

## 2024-02-09 NOTE — Assessment & Plan Note (Signed)
 TOC confirmed that patient has a BiPAP at home

## 2024-02-09 NOTE — Assessment & Plan Note (Addendum)
 With CO2 73 on admission.  Repeat VBG from yesterday with improved CO2. -Continue BiPAP at night and while taking naps during the day

## 2024-02-09 NOTE — Assessment & Plan Note (Addendum)
 OSA on BiPAP

## 2024-02-09 NOTE — H&P (Signed)
 History and Physical    Patient: Lucas [name] RUE:[id number] DOB: 25-Jun-2001 DOA: 02/08/2024 DOS: the patient was seen and examined on 02/09/2024 PCP: Kandyce Rud, MD  Patient coming from: Home  Chief Complaint:  Chief Complaint  Patient presents with   Constipation    HPI: [name] is a 23 y.o. male with medical history significant for Duchenne's muscular dystrophy, autism with intellectual disability, seizure disorder, obstructive and restrictive lung disease BiPAP, chronic constipation on chronic laxatives, G-tube dependent, last hospitalized in December 2023 with sepsis secondary to pneumonia, who was brought to the ED due to concern for abdominal distention noticed in the past 1 day.  Mother is at bedside and provides a history.  She states that she noticed that her son's abdomen was getting distended and he was having small amounts of hard stool and he was breathing very fast.  She feels like his symptoms worsened he got a new laxative (Metamucil) from his PCP.  He has had no vomiting.  Had no cough or congestion, fever or chills. ED course and data review: Patient persistently tachycardic to the 130s to 140s and tachypneic to the high 20s and 30s.  Afebrile, BP 129/94 and O2 sat in the 90s on room air. Workup notable for: VBG with pH 7.35 and pCO2 73 WBC 11,500 with lactic acid 0.7 Respiratory viral panel negative for COVID flu and RSV CMP notable for bicarb of 35, AST/ALT of 52/53 Urinalysis sterile EKG, personally viewed and interpreted showing sinus tachycardia at 134 Chest x-ray was nonacute CT abdomen and pelvis with contrast with no focal acute abnormality and nonobstructing right renal calculi.  Gastrostomy catheter in place stomach and bowel unremarkable.  Patient was treated with an NS bolus and started on cefepime and vancomycin for possible sepsis of unknown source  At the mother's request he also got an enema and subsequently had a very small BM, without  improvement in overall condition  Hospitalist consulted for admission.     Past Medical History:  Diagnosis Date   Autism    Autism    Community acquired pneumonia of left lower lobe of lung 03/03/2022   Diarrhea 06/09/2015   Family history of adverse reaction to anesthesia    MGGM- N/V   Muscular dystrophy (HCC)    Scoliosis    Seizures (HCC)    Last one 2013   Sepsis (HCC) 03/03/2022   Transient alteration of awareness    Viral gastroenteritis    Yeast infection of the skin 10/24/2020   Past Surgical History:  Procedure Laterality Date   CIRCUMCISION     at birth   LAPAROSCOPIC GASTROSTOMY N/A 05/24/2017   Procedure: LAPAROSCOPIC GASTROSTOMY TUBE PLACEMENT;  Surgeon: Kandice Hams, MD;  Location: [name];  Service: General;  Laterality: N/A;   SPINE SURGERY N/A    Phreesia 06/29/2020   Social History:  reports that he has never smoked. He has been exposed to tobacco smoke. He has never used smokeless tobacco. He reports that he does not drink alcohol and does not use drugs.  Allergies  Allergen Reactions   Lisinopril Rash    Red bubbly rash   Penicillins Hives and Rash    Has patient had a PCN reaction causing immediate rash, facial/tongue/throat swelling, SOB or lightheadedness with hypotension: Yes Has patient had a PCN reaction causing severe rash involving mucus membranes or skin necrosis: Yes Has patient had a PCN reaction that required hospitalization: No Has patient had a PCN reaction occurring within  the last 10 years: No If all of the above answers are "NO", then may proceed with Cephalosporin use.    Docusate Sodium     Rash and cant sleep   Multivitamins     Red in face, itchy, bumps   Other Other (See Comments)    Senkot -- gets rash and cant sleep   Aloe Vera Rash    Mom report "red little dots" after applying onto face   Calcitonin Rash   Esomeprazole Magnesium Nausea And Vomiting   Omeprazole Other (See Comments)    Constipation and possible rash  after drug was stopped   Simethicone Palpitations and Rash   Sunflower Oil Itching, Rash and Other (See Comments)    GI upset - Avoid products containing this additive   Vitamin D Analogs Other (See Comments)    Rash on face if it contains sunflower seed oil Excessive urine output and thirsty, increase in gas    Family History  Problem Relation Age of Onset   Cancer Paternal Grandfather        died at age 57   Dementia Paternal Grandmother        died at age 7   Other Mother        in special education, a carrier for Duchenne Muscular Dystrophy   Hyperlipidemia Mother    Learning disabilities Mother    Other Maternal Uncle        Duchenne Muscular Dystrophy, was in special educations   Other Maternal Aunt        was in special education   Other Maternal Grandmother        a carrier for Duchenne Muscular Dystrophy/Died due to septic shock at 23 years old    Heart failure Maternal Grandmother    Hypertension Maternal Grandmother    Alcohol abuse Father    Asthma Other    Hyperlipidemia Other    Hypertension Other    Vision loss Other     Prior to Admission medications   Medication Sig Start Date End Date Taking? Authorizing Provider  acetaminophen (TYLENOL) 160 MG/5ML elixir Take 20.3 mLs (650 mg total) by mouth every 4 (four) hours as needed for pain. 12/02/22   Pilar Jarvis, MD  acetaminophen (TYLENOL) 500 MG tablet Take 500 mg by mouth every 6 (six) hours as needed for mild pain (pain score 1-3).    [provider]  carbamazepine (TEGRETOL) 100 MG chewable tablet TAKE 1 & 1/2 TABLETS BY MOUTH TWICE A DAY 11/27/23   Margurite Auerbach, MD  levalbuterol Pauline Aus) 1.25 MG/3ML nebulizer solution Take 1.25 mg by nebulization every 4 (four) hours as needed. 03/03/22   [provider]  Nutritional Supplements (PEDIASURE GROW & GAIN) LIQD Drink 1 bottle spaced out 4 times a day 05/29/23   Margurite Auerbach, MD  nystatin (MYCOSTATIN/NYSTOP) powder Apply 1 Application  topically 3 (three) times daily. 08/02/22   [provider]  polyethylene glycol powder (GOODSENSE CLEARLAX) 17 GM/SCOOP powder TAKE 17 GM BY MOUTH TWICE DAILY AS DIRECTED WITH PRUNE JUICE 10/31/23   Vanga, Loel Dubonnet, MD  prednisoLONE (ORAPRED) 15 MG/5ML solution PLACE 3 ML INTO FEEDING TUBE DAILY 12/07/23   Margurite Auerbach, MD    Physical Exam: Vitals:   02/09/24 0000 02/09/24 0230 02/09/24 0330 02/09/24 0411  BP: 127/82 128/83 126/85   Pulse: (!) 142 (!) 140    Resp: (!) 28 (!) 29 (!) 28 (!) 26  Temp:  98.6 F (37 C)  TempSrc:  Oral    SpO2: 100% 100% 100%   Weight:      Height:       Physical Exam Vitals and nursing note reviewed.  Constitutional:      General: He is not in acute distress.    Comments: Nonverbal, awake tachypneic  HENT:     Head: Normocephalic and atraumatic.  Cardiovascular:     Rate and Rhythm: Regular rhythm. Tachycardia present.     Heart sounds: Normal heart sounds.  Pulmonary:     Effort: Tachypnea present.     Breath sounds: Normal breath sounds.  Abdominal:     General: There is distension.     Palpations: Abdomen is soft.     Tenderness: There is no abdominal tenderness.  Musculoskeletal:     Comments: Contracted extremities  Neurological:     Mental Status: Mental status is at baseline.     Labs on Admission: I have personally reviewed following labs and imaging studies  CBC: Recent Labs  Lab 02/08/24 2300  WBC 11.5*  NEUTROABS 8.7*  HGB 16.5  HCT 49.0  MCV 92.3  PLT 227   Basic Metabolic Panel: Recent Labs  Lab 02/08/24 2300  NA 135  K 4.4  CL 89*  CO2 35*  GLUCOSE 79  BUN 8  CREATININE <0.30*  CALCIUM 9.5   GFR: CrCl cannot be calculated (This lab value cannot be used to calculate CrCl because it is not a number: <0.30). Liver Function Tests: Recent Labs  Lab 02/08/24 2300  AST 52*  ALT 53*  ALKPHOS 94  BILITOT 0.6  PROT 7.2  ALBUMIN 4.2   No results for input(s): "LIPASE", "AMYLASE" in  the last 168 hours. No results for input(s): "AMMONIA" in the last 168 hours. Coagulation Profile: Recent Labs  Lab 02/08/24 2334  INR 1.0   Cardiac Enzymes: No results for input(s): "CKTOTAL", "CKMB", "CKMBINDEX", "TROPONINI" in the last 168 hours. BNP (last 3 results) No results for input(s): "PROBNP" in the last 8760 hours. HbA1C: No results for input(s): "HGBA1C" in the last 72 hours. CBG: No results for input(s): "GLUCAP" in the last 168 hours. Lipid Profile: No results for input(s): "CHOL", "HDL", "LDLCALC", "TRIG", "CHOLHDL", "LDLDIRECT" in the last 72 hours. Thyroid Function Tests: No results for input(s): "TSH", "T4TOTAL", "FREET4", "T3FREE", "THYROIDAB" in the last 72 hours. Anemia Panel: No results for input(s): "VITAMINB12", "FOLATE", "FERRITIN", "TIBC", "IRON", "RETICCTPCT" in the last 72 hours. Urine analysis:    Component Value Date/Time   COLORURINE STRAW (A) 02/09/2024 0057   APPEARANCEUR CLEAR (A) 02/09/2024 0057   APPEARANCEUR Clear 06/08/2014 1957   LABSPEC 1.016 02/09/2024 0057   LABSPEC 1.019 06/08/2014 1957   PHURINE 7.0 02/09/2024 0057   GLUCOSEU 150 (A) 02/09/2024 0057   GLUCOSEU Negative 06/08/2014 1957   HGBUR NEGATIVE 02/09/2024 0057   BILIRUBINUR NEGATIVE 02/09/2024 0057   BILIRUBINUR Negative 06/08/2014 1957   KETONESUR NEGATIVE 02/09/2024 0057   PROTEINUR NEGATIVE 02/09/2024 0057   NITRITE NEGATIVE 02/09/2024 0057   LEUKOCYTESUR NEGATIVE 02/09/2024 0057   LEUKOCYTESUR Negative 06/08/2014 1957    Radiological Exams on Admission: CT ABDOMEN PELVIS W CONTRAST Result Date: 02/09/2024 CLINICAL DATA:  Acute abdominal pain EXAM: CT ABDOMEN AND PELVIS WITH CONTRAST TECHNIQUE: Multidetector CT imaging of the abdomen and pelvis was performed using the standard protocol following bolus administration of intravenous contrast. RADIATION DOSE REDUCTION: This exam was performed according to the departmental dose-optimization program which includes  automated exposure control, adjustment of the mA and/or kV  according to patient size and/or use of iterative reconstruction technique. CONTRAST:  OMNIPAQUE IOHEXOL 300 MG/ML  SOLN COMPARISON:  11/19/2022 FINDINGS: Lower chest: Lung bases are free of acute infiltrate. A very small right-sided pleural effusion is noted. Hepatobiliary: No focal liver abnormality is seen. No gallstones, gallbladder wall thickening, or biliary dilatation. Pancreas: Unremarkable. No pancreatic ductal dilatation or surrounding inflammatory changes. Spleen: Normal in size without focal abnormality. Adrenals/Urinary Tract: Adrenal glands are within normal limits. Kidneys demonstrate a normal enhancement pattern bilaterally. Scattered nonobstructing right renal calculi are noted measuring 2-3 mm. No obstructive changes are seen. The bladder is well distended. Stomach/Bowel: No obstructive or inflammatory changes of the colon are seen. The appendix is within normal limits. Gastrostomy catheter is noted in place. Small bowel is within normal limits. Vascular/Lymphatic: No significant vascular findings are present. No enlarged abdominal or pelvic lymph nodes. Reproductive: Prostate is unremarkable. Other: No abdominal wall hernia or abnormality. No abdominopelvic ascites. Musculoskeletal: Postsurgical changes are noted in the thoracolumbar spine. Scoliosis is seen concave to the left. IMPRESSION: Nonobstructing right renal calculi. No other focal acute abnormality is noted Electronically Signed   By: Alcide Clever M.D.   On: 02/09/2024 00:59   DG Chest Port 1 View Result Date: 02/09/2024 CLINICAL DATA:  Constipation, shortness of breath, acting different, muscular dystrophy, questionable sepsis EXAM: PORTABLE CHEST 1 VIEW COMPARISON:  Radiograph and CT 11/19/2022 FINDINGS: Stable cardiomediastinal silhouette. Low lung volumes. No focal consolidation, pleural effusion, or pneumothorax. Spinal fusion hardware. No displaced rib fractures.  IMPRESSION: Low lung volumes without acute cardiopulmonary disease. Electronically Signed   By: Minerva Fester M.D.   On: 02/09/2024 00:48     Data Reviewed: Relevant notes from primary care and specialist visits, past discharge summaries as available in EHR, including Care Everywhere. Prior diagnostic testing as pertinent to current admission diagnoses Updated medications and problem lists for reconciliation ED course, including vitals, labs, imaging, treatment and response to treatment Triage notes, nursing and pharmacy notes and ED provider's notes Notable results as noted in HPI   Assessment and Plan: * SIRS (systemic inflammatory response syndrome) (HCC) Possible sepsis but no obvious source, respiratory versus GI. Will continue broad-spectrum antibiotics with cefepime vancomycin and Flagyl Continue sepsis fluids Follow-up procalcitonin Might benefit from CT chest however will have to wait 24 hours for contrasted study\ Mental oxygen if needed   Sinus tachycardia Heart rate up to the 140s Suspect SIRS possibly related to an acute infection Will get D-dimer and if elevated will get CTA in 24 hours Will give an initial 1 mg/kg dose of Lovenox while further workup in progress for possibility of PE  Chronic constipation Abdominal distention Gastrostomy status CT abdomen and pelvis was unrevealing Continue daily MiraLAX.  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 Observation status is judged to be reasonable and necessary in order to provide the required intensity of service to ensure the patient's safety. The patient's presenting symptoms, physical exam findings, and initial radiographic  and laboratory data in the context of their medical condition is felt to place them at decreased risk for further clinical deterioration. Furthermore, it is anticipated that the patient will be medically stable for discharge from the hospital within 2 midnights of admission.   Author: Andris Baumann, MD 02/09/2024 4:18 AM  For on call review [email or web address].

## 2024-02-09 NOTE — Assessment & Plan Note (Addendum)
 I do not have a source of infection.  Patient had tachycardia and tachypnea and slight leukocytosis.  Procalcitonin is negative.  Repeat chest x-ray without any infiltrate.  CT scan of the abdomen does not show infectious source.  Urine analysis also negative. -Discontinuing antibiotics

## 2024-02-10 ENCOUNTER — Inpatient Hospital Stay

## 2024-02-10 DIAGNOSIS — R6510 Systemic inflammatory response syndrome (SIRS) of non-infectious origin without acute organ dysfunction: Secondary | ICD-10-CM | POA: Diagnosis not present

## 2024-02-10 DIAGNOSIS — J9601 Acute respiratory failure with hypoxia: Secondary | ICD-10-CM | POA: Diagnosis not present

## 2024-02-10 DIAGNOSIS — R000 Tachycardia, unspecified: Secondary | ICD-10-CM | POA: Diagnosis not present

## 2024-02-10 DIAGNOSIS — R140 Abdominal distension (gaseous): Secondary | ICD-10-CM | POA: Diagnosis not present

## 2024-02-10 DIAGNOSIS — E878 Other disorders of electrolyte and fluid balance, not elsewhere classified: Secondary | ICD-10-CM

## 2024-02-10 LAB — BASIC METABOLIC PANEL
Anion gap: 13 (ref 5–15)
Anion gap: 7 (ref 5–15)
Anion gap: 9 (ref 5–15)
BUN: <5 mg/dL — ABNORMAL LOW (ref 6–20)
BUN: <5 mg/dL — ABNORMAL LOW (ref 6–20)
BUN: <5 mg/dL — ABNORMAL LOW (ref 6–20)
CO2: 32 mmol/L (ref 22–32)
CO2: 36 mmol/L — ABNORMAL HIGH (ref 22–32)
CO2: 34 mmol/L — ABNORMAL HIGH (ref 22–32)
Calcium: 8.9 mg/dL (ref 8.9–10.3)
Calcium: 8.6 mg/dL — ABNORMAL LOW (ref 8.9–10.3)
Calcium: 9.6 mg/dL (ref 8.9–10.3)
Chloride: 90 mmol/L — ABNORMAL LOW (ref 98–111)
Chloride: 92 mmol/L — ABNORMAL LOW (ref 98–111)
Chloride: 94 mmol/L — ABNORMAL LOW (ref 98–111)
Creatinine, Ser: <0.3 mg/dL — ABNORMAL LOW (ref 0.61–1.24)
Creatinine, Ser: <0.3 mg/dL — ABNORMAL LOW (ref 0.61–1.24)
Creatinine, Ser: <0.3 mg/dL — ABNORMAL LOW (ref 0.61–1.24)
Glucose, Bld: 117 mg/dL — ABNORMAL HIGH (ref 70–99)
Glucose, Bld: 312 mg/dL — ABNORMAL HIGH (ref 70–99)
Glucose, Bld: 204 mg/dL — ABNORMAL HIGH (ref 70–99)
Potassium: 2.3 mmol/L — CL (ref 3.5–5.1)
Potassium: >7.5 mmol/L (ref 3.5–5.1)
Potassium: 6.5 mmol/L (ref 3.5–5.1)
Sodium: 135 mmol/L (ref 135–145)
Sodium: 135 mmol/L (ref 135–145)
Sodium: 137 mmol/L (ref 135–145)

## 2024-02-10 LAB — COMPREHENSIVE METABOLIC PANEL
ALT: 73 U/L — ABNORMAL HIGH (ref 0–44)
AST: 109 U/L — ABNORMAL HIGH (ref 15–41)
Albumin: 3.9 g/dL (ref 3.5–5.0)
Alkaline Phosphatase: 89 U/L (ref 38–126)
Anion gap: 10 (ref 5–15)
BUN: <5 mg/dL — ABNORMAL LOW (ref 6–20)
CO2: 39 mmol/L — ABNORMAL HIGH (ref 22–32)
Calcium: 10.2 mg/dL (ref 8.9–10.3)
Chloride: 89 mmol/L — ABNORMAL LOW (ref 98–111)
Creatinine, Ser: <0.3 mg/dL — ABNORMAL LOW (ref 0.61–1.24)
Glucose, Bld: 123 mg/dL — ABNORMAL HIGH (ref 70–99)
Potassium: 5 mmol/L (ref 3.5–5.1)
Sodium: 138 mmol/L (ref 135–145)
Total Bilirubin: 1 mg/dL (ref 0.0–1.2)
Total Protein: 7 g/dL (ref 6.5–8.1)

## 2024-02-10 LAB — CBC
HCT: 47.1 % (ref 39.0–52.0)
Hemoglobin: 16.1 g/dL (ref 13.0–17.0)
MCH: 30.7 pg (ref 26.0–34.0)
MCHC: 34.2 g/dL (ref 30.0–36.0)
MCV: 89.9 fL (ref 80.0–100.0)
Platelets: 166 10*3/uL (ref 150–400)
RBC: 5.24 MIL/uL (ref 4.22–5.81)
RDW: 13.4 % (ref 11.5–15.5)
WBC: 9 10*3/uL (ref 4.0–10.5)
nRBC: 0 % (ref 0.0–0.2)

## 2024-02-10 LAB — MAGNESIUM: Magnesium: 1.5 mg/dL — ABNORMAL LOW (ref 1.7–2.4)

## 2024-02-10 LAB — MRSA NEXT GEN BY PCR, NASAL: MRSA by PCR Next Gen: NOT DETECTED

## 2024-02-10 LAB — GLUCOSE, CAPILLARY: Glucose-Capillary: 251 mg/dL — ABNORMAL HIGH (ref 70–99)

## 2024-02-10 LAB — PHOSPHORUS: Phosphorus: 4.2 mg/dL (ref 2.5–4.6)

## 2024-02-10 MED ORDER — POTASSIUM CHLORIDE 20 MEQ PO PACK
60.0000 meq | PACK | ORAL | Status: AC
  Administered 2024-02-10 (×2): 60 meq
  Filled 2024-02-10 (×2): qty 3

## 2024-02-10 MED ORDER — INSULIN ASPART 100 UNIT/ML IV SOLN
10.0000 [IU] | Freq: Once | INTRAVENOUS | Status: AC
  Administered 2024-02-10: 10 [IU] via INTRAVENOUS
  Filled 2024-02-10: qty 0.1

## 2024-02-10 MED ORDER — SODIUM ZIRCONIUM CYCLOSILICATE 5 G PO PACK
10.0000 g | PACK | Freq: Every day | ORAL | Status: DC
  Administered 2024-02-10: 10 g
  Filled 2024-02-10: qty 2

## 2024-02-10 MED ORDER — METOPROLOL TARTRATE 50 MG PO TABS
50.0000 mg | ORAL_TABLET | Freq: Two times a day (BID) | ORAL | Status: DC
  Administered 2024-02-10 – 2024-02-13 (×5): 50 mg
  Filled 2024-02-10 (×5): qty 1

## 2024-02-10 MED ORDER — CALCIUM GLUCONATE-NACL 1-0.675 GM/50ML-% IV SOLN
1.0000 g | Freq: Once | INTRAVENOUS | Status: AC
  Administered 2024-02-10: 1000 mg via INTRAVENOUS
  Filled 2024-02-10: qty 50

## 2024-02-10 MED ORDER — SODIUM CHLORIDE 0.9 % IV BOLUS: 500.0000 mL | Freq: Once | INTRAVENOUS | Status: DC

## 2024-02-10 MED ORDER — DEXTROSE 50 % IV SOLN
25.0000 mL | Freq: Once | INTRAVENOUS | Status: AC
  Administered 2024-02-10: 25 mL via INTRAVENOUS
  Filled 2024-02-10: qty 50

## 2024-02-10 MED ORDER — FUROSEMIDE 10 MG/ML IJ SOLN
40.0000 mg | Freq: Once | INTRAMUSCULAR | Status: AC
  Administered 2024-02-10: 40 mg via INTRAVENOUS
  Filled 2024-02-10: qty 4

## 2024-02-10 MED ORDER — CALCIUM CHLORIDE 10 % IV SOLN
1.0000 g | Freq: Once | INTRAVENOUS | Status: AC
  Administered 2024-02-10: 1 g via INTRAVENOUS

## 2024-02-10 MED ORDER — MAGNESIUM SULFATE 4 GM/100ML IV SOLN
4.0000 g | Freq: Once | INTRAVENOUS | Status: AC
  Administered 2024-02-10: 4 g via INTRAVENOUS
  Filled 2024-02-10: qty 100

## 2024-02-10 NOTE — TOC Progression Note (Signed)
 Transition of Care [facility]) - Progression Note    Patient Details  Name: Lucas [name] MRN: [id number] Date of Birth: 11-18-2001  Transition of [facility]) CM/SW Contact  Liliana Cline, LCSW Phone Number: 02/10/2024, 4:46 PM  Clinical Narrative:    CSW received 2 calls from Lupita Leash with [employer] [phone]). Called Lupita Leash back, informed her of ICU number to request medical updates.   Expected Discharge Plan: Home w Home Health Services Barriers to Discharge: Continued Medical Work up  Expected Discharge Plan and Services       Living arrangements for the past 2 months: Single Family Home                                       Social Determinants of Health (SDOH) Interventions SDOH Screenings   Tobacco Use: Medium Risk (12/17/2023)  Health Literacy: Low Risk  (03/21/2021)   Received from [facility], [facility]    Readmission Risk Interventions     No data to display

## 2024-02-10 NOTE — Consult Note (Signed)
 PULMONOLOGY         Date: 02/10/2024,   MRN# [id number] Lucas [name] 09/07/2001     AdmissionWeight: 61.7 kg                 CurrentWeight: 61.7 kg  Referring provider: Dr Nelson Chimes    CHIEF COMPLAINT:   Acute respiratory failure with hypoxemia and hypercapnia  HISTORY OF PRESENT ILLNESS   Patient with hx of duchenne's muscular dystrophy, autism and cognitive impairment, seizure disorder chronic respiratory insufficiency with home bipap use at bedtime.  He has PEG tube for nourishment coming in for abdominal distention and constipation. He had abdominal imaging with CT scan performed and showed no acute abdominal pathology and presence of non obstructive renal calculi on right.  Patient was negative for COVID 02/08/24. CMP with hypercarbic metabolic alkalosis. VBG with similar findings of respiratory acidosis.  PCCM for further evaluation and management of patient with acute on chronic respiratory failure with hypercapneic hypoxemic respiratory failure.     PAST MEDICAL HISTORY   Past Medical History:  Diagnosis Date   Autism    Autism    Community acquired pneumonia of left lower lobe of lung 03/03/2022   Diarrhea 06/09/2015   Family history of adverse reaction to anesthesia    MGGM- N/V   Muscular dystrophy (HCC)    Scoliosis    Seizures (HCC)    Last one 2013   Sepsis (HCC) 03/03/2022   Transient alteration of awareness    Viral gastroenteritis    Yeast infection of the skin 10/24/2020     SURGICAL HISTORY   Past Surgical History:  Procedure Laterality Date   CIRCUMCISION     at birth   LAPAROSCOPIC GASTROSTOMY N/A 05/24/2017   Procedure: LAPAROSCOPIC GASTROSTOMY TUBE PLACEMENT;  Surgeon: Kandice Hams, MD;  Location: [name];  Service: General;  Laterality: N/A;   SPINE SURGERY N/A    Phreesia 06/29/2020     FAMILY HISTORY   Family History  Problem Relation Age of Onset   Cancer Paternal Grandfather        died at age 24   Dementia Paternal  Grandmother        died at age 21   Other Mother        in special education, a carrier for Duchenne Muscular Dystrophy   Hyperlipidemia Mother    Learning disabilities Mother    Other Maternal Uncle        Duchenne Muscular Dystrophy, was in special educations   Other Maternal Aunt        was in special education   Other Maternal Grandmother        a carrier for Duchenne Muscular Dystrophy/Died due to septic shock at 23 years old    Heart failure Maternal Grandmother    Hypertension Maternal Grandmother    Alcohol abuse Father    Asthma Other    Hyperlipidemia Other    Hypertension Other    Vision loss Other      SOCIAL HISTORY   Social History   Tobacco Use   Smoking status: Never    Passive exposure: Yes   Smokeless tobacco: Never   Tobacco comments:    Mom smokes outside  Vaping Use   Vaping status: Never Used  Substance Use Topics   Alcohol use: No   Drug use: No     MEDICATIONS    Home Medication:    Current Medication:  Current Facility-Administered Medications:  acetaminophen (TYLENOL) 160 MG/5ML suspension 640 mg, 640 mg, Oral, Q4H PRN, Andris Baumann, MD   carbamazepine (TEGRETOL) chewable tablet 100 mg, 100 mg, Per Tube, BID, Andris Baumann, MD, 100 mg at 02/10/24 0941   diltiazem (CARDIZEM) tablet 30 mg, 30 mg, Per Tube, Q6H, Alford Highland, MD, 30 mg at 02/10/24 1326   diphenhydrAMINE (BENADRYL) injection 25 mg, 25 mg, Intravenous, Q6H PRN, Andris Baumann, MD, 25 mg at 02/09/24 0430   enoxaparin (LOVENOX) injection 40 mg, 40 mg, Subcutaneous, Q24H, Wieting, Richard, MD, 40 mg at 02/09/24 2116   famotidine (PEPCID) IVPB 20 mg premix, 20 mg, Intravenous, Q12H, Andris Baumann, MD, Last Rate: 100 mL/hr at 02/10/24 0929, 20 mg at 02/10/24 0929   levalbuterol (XOPENEX) nebulizer solution 1.25 mg, 1.25 mg, Nebulization, Q4H PRN, Andris Baumann, MD   metoprolol tartrate (LOPRESSOR) injection 5 mg, 5 mg, Intravenous, Q1H PRN, Alford Highland, MD    metoprolol tartrate (LOPRESSOR) tablet 50 mg, 50 mg, Per Tube, BID, Arnetha Courser, MD   morphine (PF) 2 MG/ML injection 2 mg, 2 mg, Intravenous, Once, Pilar Jarvis, MD   ondansetron [facility]) tablet 4 mg, 4 mg, Oral, Q6H PRN **OR** ondansetron (ZOFRAN) injection 4 mg, 4 mg, Intravenous, Q6H PRN, Andris Baumann, MD   polyethylene glycol (MIRALAX / GLYCOLAX) packet 17 g, 17 g, Per Tube, Daily, Wieting, Richard, MD    ALLERGIES   Lisinopril, Penicillins, Docusate sodium, Metamucil [psyllium], Multivitamins, Other, Aloe vera, Calcitonin, Esomeprazole magnesium, Omeprazole, Simethicone, Sunflower oil, and Vitamin d analogs     REVIEW OF SYSTEMS    Review of Systems:  Gen:  Denies  fever, sweats, chills weigh loss  HEENT: Denies blurred vision, double vision, ear pain, eye pain, hearing loss, nose bleeds, sore throat Cardiac:  No dizziness, chest pain or heaviness, chest tightness,edema Resp:   reports dyspnea chronically  Gi: Denies swallowing difficulty, stomach pain, nausea or vomiting, diarrhea, constipation, bowel incontinence Gu:  Denies bladder incontinence, burning urine Ext:   Denies Joint pain, stiffness or swelling Skin: Denies  skin rash, easy bruising or bleeding or hives Endoc:  Denies polyuria, polydipsia , polyphagia or weight change Psych:   Denies depression, insomnia or hallucinations   Other:  All other systems negative   VS: BP (!) 128/99   Pulse (!) 116   Temp (!) 97.1 F (36.2 C) (Axillary)   Resp 13   Ht 5' (1.524 m)   Wt 61.7 kg   SpO2 100%   BMI 26.56 kg/m      PHYSICAL EXAM    GENERAL:NAD, no fevers, chills, no weakness no fatigue HEAD: Normocephalic, atraumatic.  EYES: Pupils equal, round, reactive to light. Extraocular muscles intact. No scleral icterus.  MOUTH: Moist mucosal membrane. Dentition intact. No abscess noted.  EAR, NOSE, THROAT: Clear without exudates. No external lesions.  NECK: Supple. No thyromegaly. No nodules. No JVD.   PULMONARY: decreased breath sounds with mild rhonchi worse at bases bilaterally.  CARDIOVASCULAR: S1 and S2. Regular rate and rhythm. No murmurs, rubs, or gallops. No edema. Pedal pulses 2+ bilaterally.  GASTROINTESTINAL: Soft, nontender, nondistended. No masses. Positive bowel sounds. No hepatosplenomegaly.  MUSCULOSKELETAL: No swelling, clubbing, or edema. Range of motion full in all extremities.  NEUROLOGIC: Cranial nerves II through XII are intact. No gross focal neurological deficits. Sensation intact. Reflexes intact.  SKIN: No ulceration, lesions, rashes, or cyanosis. Skin warm and dry. Turgor intact.  PSYCHIATRIC: Mood, affect within normal limits. The patient is awake, alert and oriented  x 3. Insight, judgment intact.       IMAGING         ASSESSMENT/PLAN   Acute on chronic hypoxemic hypercapnic respiratory failure    - currently on BIPAP please continue     - CRP for possible viral LRTI as etiology     - mild leukocytosis , blood cultures in process    -respiratory cultures    - procalcitonin and MRSA +    Seizure disorder  - on Tegretol  Thank you for allowing me to participate in the care of this patient.   Patient/Family are satisfied with care plan and all questions have been answered.    Provider disclosure: Patient with at least one acute or chronic illness or injury that poses a threat to life or bodily function and is being managed actively during this encounter.  All of the below services have been performed independently by signing provider:  review of prior documentation from internal and or external health records.  Review of previous and current lab results.  Interview and comprehensive assessment during patient visit today. Review of current and previous chest radiographs/CT scans. Discussion of management and test interpretation with health care team and patient/family.   This document was prepared using Dragon voice recognition software and may  include unintentional dictation errors.     Vida Rigger, M.D.  Division of Pulmonary & Critical Care Medicine

## 2024-02-10 NOTE — Assessment & Plan Note (Signed)
 Patient with significant variations in potassium results which does not make much sense. Currently potassium trending down -Replace electrolytes per pharmacy as needed

## 2024-02-10 NOTE — ED Provider Notes (Signed)
 [facility] Department of Emergency Medicine   Code Blue CONSULT NOTE  Chief Complaint: Patient with decreased respirations currently being bagged by respiratory therapist upon my arrival.    Level V Caveat: Patient has developmental delay at baseline unable to provide any history.  Nonverbal at baseline.  History of present illness: I was contacted by the hospital for a CODE BLUE cardiac arrest upstairs and presented to the patient's bedside.  Found the patient to have diminished tidal volume with his respirations being assisted with a bag-valve-mask by respiratory therapy.  Patient is diaphoretic with a blood pressure in the 80s tachycardic around 120 bpm.  Patient is awake and will look around with his eyes at times.  He is nonverbal at baseline unable to follow any commands or provide any history.  ROS: Unable to obtain  Scheduled Meds:  carbamazepine  100 mg Per Tube BID   diltiazem  30 mg Per Tube Q6H   enoxaparin (LOVENOX) injection  40 mg Subcutaneous Q24H   free water  100 mL Per Tube 5 X Daily   metoprolol tartrate  50 mg Per Tube BID    morphine injection  2 mg Intravenous Once   PediaSure Enteral 1.0 Cal  237 mL Per Tube 5 X Daily   polyethylene glycol  17 g Per Tube Daily   Continuous Infusions:  famotidine (PEPCID) IV 20 mg (02/10/24 0929)   PRN Meds:.acetaminophen, diphenhydrAMINE, levalbuterol, metoprolol tartrate, ondansetron **OR** ondansetron (ZOFRAN) IV Past Medical History:  Diagnosis Date   Autism    Autism    Community acquired pneumonia of left lower lobe of lung 03/03/2022   Diarrhea 06/09/2015   Family history of adverse reaction to anesthesia    MGGM- N/V   Muscular dystrophy (HCC)    Scoliosis    Seizures (HCC)    Last one 2013   Sepsis (HCC) 03/03/2022   Transient alteration of awareness    Viral gastroenteritis    Yeast infection of the skin 10/24/2020   Past Surgical History:  Procedure Laterality Date   CIRCUMCISION      at birth   LAPAROSCOPIC GASTROSTOMY N/A 05/24/2017   Procedure: LAPAROSCOPIC GASTROSTOMY TUBE PLACEMENT;  Surgeon: Kandice Hams, MD;  Location: [name];  Service: General;  Laterality: N/A;   SPINE SURGERY N/A    Phreesia 06/29/2020   Social History   Socioeconomic History   Marital status: Single    Spouse name: Not on file   Number of children: Not on file   Years of education: Not on file   Highest education level: Not on file  Occupational History   Not on file  Tobacco Use   Smoking status: Never    Passive exposure: Yes   Smokeless tobacco: Never   Tobacco comments:    Mom smokes outside  Vaping Use   Vaping status: Never Used  Substance and Sexual Activity   Alcohol use: No   Drug use: No   Sexual activity: Never  Other Topics Concern   Not on file  Social History Narrative   He lives with mother.   He graduated high school in 22.    He is not currently in a day program    Social Drivers of Corporate investment banker Strain: Not on file  Food Insecurity: Not on file  Transportation Needs: Not on file  Physical Activity: Not on file  Stress: Not on file  Social Connections: Not on file  Intimate Partner Violence: Not on  file   Allergies  Allergen Reactions   Lisinopril Rash    Red bubbly rash   Penicillins Hives and Rash    Has patient had a PCN reaction causing immediate rash, facial/tongue/throat swelling, SOB or lightheadedness with hypotension: Yes Has patient had a PCN reaction causing severe rash involving mucus membranes or skin necrosis: Yes Has patient had a PCN reaction that required hospitalization: No Has patient had a PCN reaction occurring within the last 10 years: No If all of the above answers are "NO", then may proceed with Cephalosporin use.    Docusate Sodium     Rash and cant sleep   Metamucil [Psyllium] Other (See Comments)    "Interacted with seizure medicine"   Multivitamins     Red in face, itchy, bumps   Other Other (See  Comments)    Senkot -- gets rash and cant sleep   Aloe Vera Rash    Mom report "red little dots" after applying onto face   Calcitonin Rash   Esomeprazole Magnesium Nausea And Vomiting   Omeprazole Other (See Comments)    Constipation and possible rash after drug was stopped   Simethicone Palpitations and Rash   Sunflower Oil Itching, Rash and Other (See Comments)    GI upset - Avoid products containing this additive   Vitamin D Analogs Other (See Comments)    Rash on face if it contains sunflower seed oil Excessive urine output and thirsty, increase in gas    Last set of Vital Signs (not current) Vitals:   02/10/24 0940 02/10/24 1225  BP: 131/88 104/64  Pulse: (!) 118 (!) 104  Resp: 17 18  Temp: 98.1 F (36.7 C) 97.9 F (36.6 C)  SpO2: 100% 97%      Physical Exam  Gen: Awake looking around but with diminished respirations very small tidal volumes Cardiovascular: Faint pulse upon arrival around 120 to 130 bpm. Resp: Decreased respirations with very low tidal volume being assisted with bag-valve-mask. Abd: nondistended, G-tube present. Skin: Diaphoretic  Procedures (when applicable, including Critical Care time): Procedures   MDM / Assessment and Plan Patient currently admitted to the hospital found to have decreased respirations very low tidal volumes.  Patient reportedly was on BiPAP yesterday and was doing well.  He is awake and will look around the room.  Developmental delay and nonverbal at baseline unable to provide any history.  Patient was receiving magnesium had just received 2 doses of potassium due to hypokalemia at 2.3 this morning.  Patient noted to have a very wide-complex rhythm on his EKG.  Patient given 1 g of calcium chloride in the patient's rhythm appeared to narrow.  Repeat EKG performed.  Patient seems to be tolerating BiPAP very well.  Dr. Lucianne Muss of the hospitalist service at bedside.  Will be moving the patient to the stepdown unit.   First EKG  performed in the room by myself shows a wide-complex tachycardia around 116 bpm with a widened QRS, normal axis, nonspecific ST changes.  After calcium repeat EKG shows sinus tachycardia 108 bpm with a narrow complex QRS.  Normal axis, normal intervals, nonspecific ST changes.   Minna Antis, MD 02/10/24 [phone]

## 2024-02-10 NOTE — Consult Note (Signed)
 PHARMACY CONSULT NOTE - ELECTROLYTES  Pharmacy Consult for Electrolyte Monitoring and Replacement   Recent Labs: Height: 5' (152.4 cm) Weight: 61.7 kg (136 lb) IBW/kg (Calculated) : 50 CrCl cannot be calculated (This lab value cannot be used to calculate CrCl because it is not a number: <0.30). Potassium (mmol/L)  Date Value  02/10/2024 6.5 (HH)  03/12/2015 3.7   Magnesium (mg/dL)  Date Value  78/29/5621 1.5 (L)   Calcium (mg/dL)  Date Value  30/86/5784 9.6   Calcium, Total (mg/dL)  Date Value  69/62/9528 9.0   Albumin (g/dL)  Date Value  41/32/4401 4.2  03/12/2015 4.8   Phosphorus (mg/dL)  Date Value  02/72/5366 4.2   Sodium (mmol/L)  Date Value  02/10/2024 137  03/12/2015 136    Assessment  Lucas [name] is a 23 y.o. 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 Patient also tachypneic and tachycardic.CODE BLUE on 3/1. Pharmacy has been consulted to monitor and replace electrolytes.  Diet: NPO  Goal of Therapy: Electrolytes WNL  Plan:  Mg 1.5 - replaced in AM by provider. K = 2.3 this AM >>6.5 (D50, IV aspart 10 units,and lokelma ordered).  Check in 2 hours after above meds given Check BMP, Mg, Phos with AM labs  Thank you for allowing pharmacy to be a part of this patient's care.

## 2024-02-10 NOTE — Progress Notes (Signed)
 Soon after arrival to unit lab called with a critical potassium value of greater than 7.5, lab redrawn and level came back as 6.5.  Provider notified and orders placed by provider.  Will continue to monitor patient.

## 2024-02-10 NOTE — Progress Notes (Signed)
 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  Patient also tachypneic and tachycardic.  CT scan of the abdomen did not show any acute infectious process.  D-dimer was negative.  Patient was started on aggressive antibiotics.  Patient did have a bowel movement with enema.  2/28.  Downgraded antibiotics to Rocephin and Zithromax.  Will use lactulose twice today for bowel movement.  Started urgently this morning on BiPAP with CO2 retention of 73 and patient being lethargic.  3/1: Potassium 2.3 with magnesium of 1.5-replating electrolytes.  Mentation seems to be at baseline now.  Chest x-ray without any infiltrate.  Remained tachycardic so metoprolol dose was increased.  Discontinuing antibiotic. Patient likely needs some oxygen with BiPAP.  Discussed with mother to use BiPAP for few hours during the day.  Mother to discuss with neurology as he was already told that he is approaching a trach and ventilatory support due to weakening diaphragm.  Patient will follow-up with his neurologist for further recommendation.   Assessment and Plan: * Acute respiratory failure with hypoxia and hypercapnia (HCC) With CO2 73 and I ordered BiPAP stat this morning.  Will recheck an ABG.  Patient's mother states that he does have a BiPAP at home.  SIRS (systemic inflammatory response syndrome) (HCC) I do not have a source of infection.  Patient had tachycardia and tachypnea and slight leukocytosis.  Procalcitonin is negative.  Repeat chest x-ray without any infiltrate.  CT scan of the abdomen does not show infectious  source.  Urine analysis also negative. -Discontinuing antibiotics  Sinus tachycardia Heart rate remained above 100. D-dimer negative which goes against pulmonary embolism.  Patient ordered sepsis fluids but I am not sure if we have an infection.  Started on metoprolol and Cardizem. -Increasing the dose of metoprolol  Electrolyte abnormality Patient with significant hypokalemia with potassium at 2.3 and magnesium of 1.5 -Replete electrolytes and monitor  Chronic constipation Abdominal distention Gastrostomy status CT abdomen and pelvis was unrevealing 2 doses of lactulose today.    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      Subjective: Patient was seen and examined today.  Mother at bedside and according to her he is at baseline now.  She was asking about discharge.  Physical Exam: Vitals:   02/10/24 0000 02/10/24 0408 02/10/24 0940 02/10/24 1225  BP: 110/70 118/68 131/88 104/64  Pulse: (!) 110 90 (!) 118 (!) 104  Resp: 16 (!) 24 17 18   Temp: 97.6 F (36.4 C) 98.1 F (36.7 C) 98.1 F (36.7 C) 97.9 F (36.6 C)  TempSrc: Axillary Axillary    SpO2: 94% 95% 100% 97%  Weight:      Height:       General.  Cognitively impaired, quadriplegic gentleman, in no acute distress. Pulmonary.  Lungs clear bilaterally, normal respiratory effort. CV.  Regular rate and rhythm, no JVD, rub or murmur. Abdomen.  Soft, nontender, nondistended, BS positive. CNS.  Awake with multiple contractures Extremities.  No edema, pulses intact and  symmetrical.  Data Reviewed: Prior data reviewed  Family Communication: Spoke with mother at the bedside  Disposition: Status is: Inpatient Remains inpatient appropriate because: Need replacement of electrolytes  Planned Discharge Destination: Home with Home Health  DVT prophylaxis.  Lovenox Time spent: 50 minutes,   Patient critically ill.  Author: Arnetha Courser, MD 02/10/2024 1:24 PM  For on call review [email or web address].

## 2024-02-10 NOTE — Progress Notes (Signed)
   02/10/24 1500  Spiritual Encounters  Type of Visit Initial  Care provided to: Family  Referral source Code page  Reason for visit Code  OnCall Visit Yes   Chaplain responded to Code Blue for Rm 240B. Patient's mother was present located in hallway. Chaplain remained present and escorted her to ICU waiting as her son was transferred.

## 2024-02-10 NOTE — TOC Progression Note (Signed)
 Transition of Care [facility]) - Progression Note    Patient Details  Name: Lucas [name] MRN: [id number] Date of Birth: July 22, 2001  Transition of Care [facility]) CM/SW Contact  Bing Quarry, RN Phone Number: 02/10/2024, 11:13 AM  Clinical Narrative: 3/1: DME home oxygen orders in for 2L/[place]. Contacted Ada at [employer], they need saturation note at rest Room Air and at rest on O2 ,since non ambulatory. She did say delivery person can show to to connect BiPaP at night, but if respiratory can check on that as well I think it might be better for her. Updated provider on needs.    Gabriel Cirri MSN RN CM  RN Case Manager [place]  Transitions of Care Direct Dial: [phone] (Weekends Only) [facility] Main Office Phone: [phone] [facility] Fax: [phone] [email or web address]      Expected Discharge Plan: Home w Home Health Services Barriers to Discharge: Continued Medical Work up  Expected Discharge Plan and Services       Living arrangements for the past 2 months: Single Family Home                                       Social Determinants of Health (SDOH) Interventions SDOH Screenings   Tobacco Use: Medium Risk (12/17/2023)  Health Literacy: Low Risk  (03/21/2021)   Received from [facility], [facility]    Readmission Risk Interventions     No data to display

## 2024-02-10 NOTE — Plan of Care (Signed)
  Problem: Clinical Measurements: Goal: Signs and symptoms of infection will decrease Outcome: Progressing   Problem: Respiratory: Goal: Ability to maintain adequate ventilation will improve Outcome: Progressing   Problem: Health Behavior/Discharge Planning: Goal: Ability to manage health-related needs will improve Outcome: Progressing   Problem: Clinical Measurements: Goal: Ability to maintain clinical measurements within normal limits will improve Outcome: Progressing Goal: Will remain free from infection Outcome: Progressing Goal: Respiratory complications will improve Outcome: Progressing   Problem: Nutrition: Goal: Adequate nutrition will be maintained Outcome: Progressing   Problem: Coping: Goal: Level of anxiety will decrease Outcome: Progressing   Problem: Elimination: Goal: Will not experience complications related to bowel motility Outcome: Progressing Goal: Will not experience complications related to urinary retention Outcome: Progressing   Problem: Pain Managment: Goal: General experience of comfort will improve and/or be controlled Outcome: Progressing   Problem: Safety: Goal: Ability to remain free from injury will improve Outcome: Progressing

## 2024-02-11 DIAGNOSIS — J9602 Acute respiratory failure with hypercapnia: Secondary | ICD-10-CM | POA: Diagnosis not present

## 2024-02-11 DIAGNOSIS — J9601 Acute respiratory failure with hypoxia: Secondary | ICD-10-CM | POA: Diagnosis not present

## 2024-02-11 LAB — RESPIRATORY PANEL BY PCR

## 2024-02-11 LAB — BASIC METABOLIC PANEL
Anion gap: 13 (ref 5–15)
Anion gap: 22 — ABNORMAL HIGH (ref 5–15)
BUN: <5 mg/dL — ABNORMAL LOW (ref 6–20)
BUN: 6 mg/dL (ref 6–20)
CO2: 39 mmol/L — ABNORMAL HIGH (ref 22–32)
CO2: 27 mmol/L (ref 22–32)
Calcium: 9.3 mg/dL (ref 8.9–10.3)
Calcium: 8.7 mg/dL — ABNORMAL LOW (ref 8.9–10.3)
Chloride: 88 mmol/L — ABNORMAL LOW (ref 98–111)
Chloride: 89 mmol/L — ABNORMAL LOW (ref 98–111)
Creatinine, Ser: <0.3 mg/dL — ABNORMAL LOW (ref 0.61–1.24)
Creatinine, Ser: <0.3 mg/dL — ABNORMAL LOW (ref 0.61–1.24)
Glucose, Bld: 75 mg/dL (ref 70–99)
Glucose, Bld: 65 mg/dL — ABNORMAL LOW (ref 70–99)
Potassium: 4 mmol/L (ref 3.5–5.1)
Potassium: 3.4 mmol/L — ABNORMAL LOW (ref 3.5–5.1)
Sodium: 140 mmol/L (ref 135–145)
Sodium: 138 mmol/L (ref 135–145)

## 2024-02-11 LAB — CBC
HCT: 44.2 % (ref 39.0–52.0)
Hemoglobin: 14.7 g/dL (ref 13.0–17.0)
MCH: 30.4 pg (ref 26.0–34.0)
MCHC: 33.3 g/dL (ref 30.0–36.0)
MCV: 91.5 fL (ref 80.0–100.0)
Platelets: 175 10*3/uL (ref 150–400)
RBC: 4.83 MIL/uL (ref 4.22–5.81)
RDW: 13.6 % (ref 11.5–15.5)
WBC: 9.4 10*3/uL (ref 4.0–10.5)
nRBC: 0 % (ref 0.0–0.2)

## 2024-02-11 LAB — POTASSIUM: Potassium: 4.7 mmol/L (ref 3.5–5.1)

## 2024-02-11 LAB — BLOOD GAS, VENOUS
Acid-Base Excess: 9.9 mmol/L — ABNORMAL HIGH (ref 0.0–2.0)
Bicarbonate: 36 mmol/L — ABNORMAL HIGH (ref 20.0–28.0)
O2 Saturation: 73.5 %
Patient temperature: 37
pCO2, Ven: 53 mmHg (ref 44–60)
pH, Ven: 7.44 — ABNORMAL HIGH (ref 7.25–7.43)
pO2, Ven: 43 mmHg (ref 32–45)

## 2024-02-11 LAB — PHOSPHORUS: Phosphorus: 3.2 mg/dL (ref 2.5–4.6)

## 2024-02-11 LAB — MAGNESIUM: Magnesium: 1.7 mg/dL (ref 1.7–2.4)

## 2024-02-11 MED ORDER — CHLORHEXIDINE GLUCONATE CLOTH 2 % EX PADS
6.0000 | MEDICATED_PAD | Freq: Every day | CUTANEOUS | Status: DC
  Administered 2024-02-11 – 2024-02-13 (×3): 6 via TOPICAL

## 2024-02-11 MED ORDER — SODIUM CHLORIDE 0.9 % IV SOLN: INTRAVENOUS | Status: AC

## 2024-02-11 MED ORDER — MAGNESIUM SULFATE 2 GM/50ML IV SOLN
2.0000 g | Freq: Once | INTRAVENOUS | Status: AC
  Administered 2024-02-11: 2 g via INTRAVENOUS
  Filled 2024-02-11: qty 50

## 2024-02-11 NOTE — Progress Notes (Signed)
 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  Patient also tachypneic and tachycardic.  CT scan of the abdomen did not show any acute infectious process.  D-dimer was negative.  Patient was started on aggressive antibiotics.  Patient did have a bowel movement with enema.   2/28.  Downgraded antibiotics to Rocephin and Zithromax.  Will use lactulose twice today for bowel movement.  Started urgently this morning on BiPAP with CO2 retention of 73 and patient being lethargic.   3/1: Potassium 2.3 with magnesium of 1.5-replating electrolytes.  Mentation seems to be at baseline now.  Chest x-ray without any infiltrate.  Remained tachycardic so metoprolol dose was increased.  Discontinuing antibiotic. Patient likely needs some oxygen with BiPAP.  Discussed with mother to use BiPAP for few hours during the day.  Mother to discuss with neurology as he was already told that he is approaching a trach and ventilatory support due to weakening diaphragm.  Patient will follow-up with his neurologist for further recommendation.  3/2: Patient remains on BiPAP per PCCM recommendation given marked hypercarbia.  Patient's mother is concerned about taking him home on BiPAP machine, is not sure how she would get him back and forth from the doctors visits.  She feels that he might need a trach now.  We discussed that even if he were to get a trach, he would still have a machine similar to a BiPAP machine.  Also of note patient's tube feeds have not been started because patient's mother thinks they are the wrong feeds and the correct feeds  need to be gotten from [facility].    Objective: Vitals:   02/11/24 1100 02/11/24 1200 02/11/24 1300 02/11/24 1340  BP: (!) 86/64 93/61 96/66    Pulse: 65 86 67 85  Resp: 12 16 12  (!) 22  Temp:      TempSrc:      SpO2: 100% 100% 100% 100%  Weight:      Height:        Intake/Output Summary (Last 24 hours) at 02/11/2024 1349 Last data filed at 02/11/2024 1300 Gross per 24 hour  Intake 195.29 ml  Output 2400 ml  Net -2204.71 ml   Filed Weights   02/08/24 2243  Weight: 61.7 kg     Exam:  General: Patient with clear physical growth retardation as well as cognitively impaired lying in bed with BiPAP in place with some contracture of neck CVS: S1-S2, regular  Respiratory: Upper respiratory sounds/BiPAP GI: NABS, soft, NT  LE: Multiple contractures, minimal muscle mass    Data Reviewed:  Basic Metabolic Panel: Recent Labs  Lab 02/10/24 0723 02/10/24 1514 02/10/24 1645 02/10/24 1953 02/11/24 0027 02/11/24 0502  NA 135 135 137 138  --  140  K 2.3* >7.5* 6.5* 5.0 4.7 4.0  CL 90* 92* 94* 89*  --  88*  CO2 32 36* 34* 39*  --  39*  GLUCOSE 117* 312* 204* 123*  --  75  BUN <5* <5* <5* <5*  --  <5*  CREATININE <0.30* <0.30* <0.30* <0.30*  --  <0.30*  CALCIUM 8.9 8.6* 9.6 10.2  --  9.3  MG 1.5*  --   --   --   --  1.7  PHOS  --  4.2  --   --   --  3.2    CBC: Recent Labs  Lab 02/08/24 2300 02/10/24 0723 02/11/24 0502  WBC 11.5* 9.0 9.4  NEUTROABS 8.7*  --   --   HGB 16.5 16.1 14.7  HCT 49.0 47.1 44.2  MCV 92.3 89.9 91.5  PLT 227 166 175     Scheduled Meds:  carbamazepine  100 mg Per Tube BID   Chlorhexidine Gluconate Cloth  6 each Topical Daily   diltiazem  30 mg Per Tube Q6H   enoxaparin (LOVENOX) injection  40 mg Subcutaneous Q24H   metoprolol tartrate  50 mg Per Tube BID    morphine injection  2 mg Intravenous Once   polyethylene glycol  17 g Per Tube Daily   Continuous Infusions:  sodium chloride     sodium chloride       Assessment &  Plan:   Chronic hypoxic respiratory failure secondary to restrictive lung disease secondary to muscular dystrophy Hypercarbia Obstructive sleep apnea treated with bilevel positive airway pressure (BiPAP) TOC confirmed that patient has a BiPAP at home Patient has been on BiPAP since yesterday Will ordered repeat VBG His mother states that she is aware that he is close to needing a tracheostomy She is hesitant about taking him home with BiPAP because of concern that the BiPAP machine would not allow her to be mobile and get to doctors offices easily She states that she does not have a BiPAP machine at home although TOC has confirmed that he does have BiPAP, will need to clarify prior to discharge  Hypokalemia Potassium and magnesium were abnormal yesterday  Will recheck and continues supplementation as warranted  Nutrition Patient's tube feeds have not been started as patient's mother is concerned that the wrong feeds were being given Apparently the correct feeds are supposed to be coming from [facility] Will start low-dose IVF in the meanwhile  Sinus tachycardia Patient was started on metoprolol and Cardizem yesterday   Copied and pasted from previous note, not addressed today: Duchenne muscular dystrophy (HCC) Autism with intellectual disability Functional quadriplegia Supportive care   Autism spectrum disorder with accompanying language impairment and intellectual disability, requiring very substantial support  Seizure disorder Continue carbamazepine  Chronic constipation Abdominal distention Gastrostomy status CT abdomen pelvis was unrevealing Continue lactulose    DVT prophylaxis: Lovenox Code Status: Full Family Communication: Mother at bedside throughout     Studies: DG Chest Port 1 View Result Date: 02/10/2024 CLINICAL DATA:  Hypoxia. History of due shins muscular dystrophy, Oda Kilts some and seizure disorder with restrictive lung disease. EXAM: PORTABLE CHEST 1  VIEW COMPARISON:  02/08/2024 FINDINGS: Stable cardiomediastinal contours. Low lung volumes. Trace bilateral pleural effusions. No interstitial edema or airspace consolidation. Spinal fusion hardware. No acute osseous findings., IMPRESSION: Low lung volumes and trace bilateral pleural effusions. Electronically Signed   By: Signa Kell M.D.   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

## 2024-02-11 NOTE — Progress Notes (Signed)
 Patient's mom said,"The patient is not talking right."  This nurse asked patient if he was ok and the patient said, "yea." Mom stated, "This is not how he normally sounds and he needs to go back on the bi-pap."  Pulmonary provider and respiratory therapy notified and patient placed back on bi-pap.  Will continue to monitor.

## 2024-02-11 NOTE — Progress Notes (Signed)
 Patient's bridge of nose and area around the bi-pap mask is becoming red - skin is blanchable. Provider notified and ordered for patient to be be transitioned to heated high flow nasal cannula.  Respiratory Therapy notified as well.

## 2024-02-11 NOTE — Consult Note (Signed)
 PHARMACY CONSULT NOTE - ELECTROLYTES  Pharmacy Consult for Electrolyte Monitoring and Replacement   Recent Labs: Height: 5' (152.4 cm) Weight: 61.7 kg (136 lb) IBW/kg (Calculated) : 50 CrCl cannot be calculated (This lab value cannot be used to calculate CrCl because it is not a number: <0.30). Potassium (mmol/L)  Date Value  02/11/2024 4.0  03/12/2015 3.7   Magnesium (mg/dL)  Date Value  95/63/8756 1.7   Calcium (mg/dL)  Date Value  43/32/9518 9.3   Calcium, Total (mg/dL)  Date Value  84/16/6063 9.0   Albumin (g/dL)  Date Value  01/60/1093 3.9  03/12/2015 4.8   Phosphorus (mg/dL)  Date Value  23/55/7322 3.2   Sodium (mmol/L)  Date Value  02/11/2024 140  03/12/2015 136    Assessment  Lucas [name] is a 23 y.o. 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 Patient also tachypneic and tachycardic.CODE BLUE on 3/1. Pharmacy has been consulted to monitor and replace electrolytes.  Diet: NPO  Goal of Therapy: Electrolytes WNL  Plan:  2 grams IV magnesium sulfate x 1 Check electrolytes with AM labs  Thank you for allowing pharmacy to be a part of this patient's care.  Burnis Medin, PharmD, BCPS

## 2024-02-11 NOTE — Plan of Care (Signed)
  Problem: Clinical Measurements: Goal: Signs and symptoms of infection will decrease Outcome: Progressing   Problem: Respiratory: Goal: Ability to maintain adequate ventilation will improve Outcome: Progressing   Problem: Clinical Measurements: Goal: Will remain free from infection Outcome: Progressing Goal: Respiratory complications will improve Outcome: Progressing   Problem: Activity: Goal: Risk for activity intolerance will decrease Outcome: Progressing   Problem: Nutrition: Goal: Adequate nutrition will be maintained Outcome: Progressing   Problem: Elimination: Goal: Will not experience complications related to bowel motility Outcome: Progressing   Problem: Pain Managment: Goal: General experience of comfort will improve and/or be controlled Outcome: Progressing   Problem: Skin Integrity: Goal: Risk for impaired skin integrity will decrease Outcome: Progressing

## 2024-02-11 NOTE — Progress Notes (Signed)
 RT came to bedside and switched patient over to [facility].

## 2024-02-11 NOTE — Plan of Care (Signed)
 Continuing with plan of care.

## 2024-02-11 NOTE — Progress Notes (Signed)
 The patient has had no urine output since 0600 this am and 0mL on bladder scan; reached out to provider her ordered NS at 22mL/hr.

## 2024-02-12 ENCOUNTER — Encounter (INDEPENDENT_AMBULATORY_CARE_PROVIDER_SITE_OTHER): Payer: Self-pay

## 2024-02-12 DIAGNOSIS — K5909 Other constipation: Secondary | ICD-10-CM | POA: Diagnosis not present

## 2024-02-12 DIAGNOSIS — R140 Abdominal distension (gaseous): Secondary | ICD-10-CM | POA: Diagnosis not present

## 2024-02-12 DIAGNOSIS — R000 Tachycardia, unspecified: Secondary | ICD-10-CM | POA: Diagnosis not present

## 2024-02-12 DIAGNOSIS — R6510 Systemic inflammatory response syndrome (SIRS) of non-infectious origin without acute organ dysfunction: Secondary | ICD-10-CM | POA: Diagnosis not present

## 2024-02-12 LAB — CBC
HCT: 43.9 % (ref 39.0–52.0)
Hemoglobin: 14.5 g/dL (ref 13.0–17.0)
MCH: 30.3 pg (ref 26.0–34.0)
MCHC: 33 g/dL (ref 30.0–36.0)
MCV: 91.6 fL (ref 80.0–100.0)
Platelets: 194 10*3/uL (ref 150–400)
RBC: 4.79 MIL/uL (ref 4.22–5.81)
RDW: 13.5 % (ref 11.5–15.5)
WBC: 8 10*3/uL (ref 4.0–10.5)
nRBC: 0 % (ref 0.0–0.2)

## 2024-02-12 LAB — BASIC METABOLIC PANEL
Anion gap: 16 — ABNORMAL HIGH (ref 5–15)
BUN: 7 mg/dL (ref 6–20)
CO2: 34 mmol/L — ABNORMAL HIGH (ref 22–32)
Calcium: 8.9 mg/dL (ref 8.9–10.3)
Chloride: 91 mmol/L — ABNORMAL LOW (ref 98–111)
Creatinine, Ser: <0.3 mg/dL — ABNORMAL LOW (ref 0.61–1.24)
Glucose, Bld: 54 mg/dL — ABNORMAL LOW (ref 70–99)
Potassium: 3.1 mmol/L — ABNORMAL LOW (ref 3.5–5.1)
Sodium: 141 mmol/L (ref 135–145)

## 2024-02-12 LAB — GLUCOSE, CAPILLARY
Glucose-Capillary: 231 mg/dL — ABNORMAL HIGH (ref 70–99)
Glucose-Capillary: 273 mg/dL — ABNORMAL HIGH (ref 70–99)
Glucose-Capillary: 54 mg/dL — ABNORMAL LOW (ref 70–99)
Glucose-Capillary: 170 mg/dL — ABNORMAL HIGH (ref 70–99)

## 2024-02-12 LAB — MAGNESIUM: Magnesium: 2 mg/dL (ref 1.7–2.4)

## 2024-02-12 MED ORDER — NON FORMULARY: 237.0000 mL | Freq: Every day | Status: DC

## 2024-02-12 MED ORDER — PEDIASURE GROW & GAIN PO LIQD
237.0000 mL | Freq: Every day | ORAL | Status: DC
  Administered 2024-02-12 – 2024-02-13 (×2): 237 mL
  Filled 2024-02-12 (×6): qty 237

## 2024-02-12 MED ORDER — POTASSIUM CHLORIDE 20 MEQ PO PACK
40.0000 meq | PACK | Freq: Once | ORAL | Status: AC
  Administered 2024-02-12: 40 meq
  Filled 2024-02-12: qty 2

## 2024-02-12 MED ORDER — DEXTROSE 50 % IV SOLN
25.0000 g | INTRAVENOUS | Status: AC
Start: 2024-02-12 — End: 2024-02-12
  Administered 2024-02-12: 25 g via INTRAVENOUS
  Filled 2024-02-12: qty 50

## 2024-02-12 MED ORDER — PEDIASURE GROW & GAIN PO LIQD
237.0000 mL | Freq: Every day | ORAL | Status: DC
  Administered 2024-02-12: 237 mL

## 2024-02-12 MED ORDER — FREE WATER
100.0000 mL | Freq: Every day | Status: DC
  Administered 2024-02-12 – 2024-02-13 (×5): 100 mL

## 2024-02-12 NOTE — Consult Note (Signed)
 PHARMACY CONSULT NOTE - ELECTROLYTES  Pharmacy Consult for Electrolyte Monitoring and Replacement   Recent Labs: Height: 5' (152.4 cm) Weight: 61.7 kg (136 lb) IBW/kg (Calculated) : 50 CrCl cannot be calculated (This lab value cannot be used to calculate CrCl because it is not a number: <0.30). Potassium (mmol/L)  Date Value  02/12/2024 3.1 (L)  03/12/2015 3.7   Magnesium (mg/dL)  Date Value  16/09/9603 2.0   Calcium (mg/dL)  Date Value  54/08/8118 8.9   Calcium, Total (mg/dL)  Date Value  14/78/2956 9.0   Albumin (g/dL)  Date Value  21/30/8657 3.9  03/12/2015 4.8   Phosphorus (mg/dL)  Date Value  84/69/6295 3.2   Sodium (mmol/L)  Date Value  02/12/2024 141  03/12/2015 136   Assessment  Lucas [name] is a 23 y.o. 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 Patient also tachypneic and tachycardic.CODE BLUE on 3/1. Pharmacy has been consulted to monitor and replace electrolytes.  Diet: NPO  Goal of Therapy: Electrolytes WNL  Plan:  Give 40 mEq of PO Kcl x 1  Check electrolytes with AM labs  Thank you for allowing pharmacy to be a part of this patient's care.  Effie Shy, PharmD Pharmacy Resident  02/12/2024 7:17 AM

## 2024-02-12 NOTE — Progress Notes (Signed)
 This nurse attempted to call patient's mother at number listed in chart, to inform her that Ercell would be transferring to room 247. No answer, no voicemail available to leave a message.

## 2024-02-12 NOTE — Progress Notes (Signed)
 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  Patient also tachypneic and tachycardic.  CT scan of the abdomen did not show any acute infectious process.  D-dimer was negative.  Patient was started on aggressive antibiotics.  Patient did have a bowel movement with enema.  2/28.  Downgraded antibiotics to Rocephin and Zithromax.  Will use lactulose twice today for bowel movement.  Started urgently this morning on BiPAP with CO2 retention of 73 and patient being lethargic.  3/1: Potassium 2.3 with magnesium of 1.5-replating electrolytes.  Mentation seems to be at baseline now.  Chest x-ray without any infiltrate.  Remained tachycardic so metoprolol dose was increased.  Discontinuing antibiotic. Patient likely needs some oxygen with BiPAP.  Discussed with mother to use BiPAP for few hours during the day.  Mother to discuss with neurology as he was already told that he is approaching a trach and ventilatory support due to weakening diaphragm.  Patient will follow-up with his neurologist for further recommendation.  3/2: Patient had unexpected respiratory arrest requiring CPR in the late afternoon of 3/1.  Recovered quickly.  Very conflicting potassium, it was 2 point 3 in the morning and was replaced with 60 mEq through PEG tube, after CPR labs were redrawn and they came back more than 7.5 followed by 6.5 on repeat.  Patient received hyperkalemia management.  Pharmacy was consulted to monitor his electrolytes carefully.  Likely a lab error as there is very significant variation which does not make  sense.  3/3: Patient is again at baseline, weaning off from oxygen as he was on 2 L of oxygen before his respiratory arrest on 3/1.  Patient likely need BiPAP during night and while taking naps during the day.  Repeat VBG yesterday with normal pCO2. Mother again asked about putting a trach in.  Patient appears stable and comfortable, there is no emergent need for tracheostomy at this time but patient likely will approach to that point due to his progressive Duchenne muscular dystrophy.  Discussed with pulmonology. That better be done by his neurologist and outpatient ENT. Restarting tube feed. If remains stable likely be discharged tomorrow    Assessment and Plan: * Acute respiratory failure with hypoxia and hypercapnia (HCC) With CO2 73 on admission.  Repeat VBG from yesterday with improved CO2. -Continue BiPAP at night and while taking naps during the day  SIRS (systemic inflammatory response syndrome) (HCC) I do not have a source of infection.  Patient had tachycardia and tachypnea and slight leukocytosis.  Procalcitonin is negative.  Repeat chest x-ray without any infiltrate.  CT scan of the abdomen does not show infectious source.  Urine analysis also negative. -Discontinuing antibiotics  Sinus tachycardia Improved D-dimer negative which goes against pulmonary embolism.   Continue with metoprolol and Cardizem.  Electrolyte abnormality Patient with significant variations in potassium results which does not make much sense. Currently potassium trending down -Replace electrolytes per pharmacy as needed  Chronic constipation Abdominal distention Gastrostomy status CT abdomen and pelvis was unrevealing 2 doses of lactulose today.    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      Subjective: Patient was awake and at baseline when seen today.  Mother was again asking about putting trach, he was saturating 100% on 6 L of oxygen.  Physical Exam: Vitals:   02/12/24 0800 02/12/24 0844 02/12/24 0900 02/12/24 1333  BP: (!) 101/55 (!) 101/55 113/75   Pulse: 98  98   Resp: (!) 28  20   Temp:   97.9 F (36.6 C)   TempSrc:   Axillary   SpO2: 98%  100% 100%  Weight:      Height:       General.  Quadriplegic gentleman, in no acute distress. Pulmonary.  Lungs clear bilaterally, normal respiratory effort. CV.  Regular rate and rhythm, no JVD, rub or murmur. Abdomen.  Soft, nontender, nondistended, BS positive. CNS.  Awake, multiple contractures Extremities.  No edema, no cyanosis, pulses intact and symmetrical.   Data Reviewed: Prior data reviewed  Family Communication: Spoke with mother at the bedside  Disposition: Status is: Inpatient Remains inpatient appropriate because: Need replacement of electrolytes  Planned Discharge Destination: Home with Home Health  DVT prophylaxis.  Lovenox Time spent: 50 minutes,   This record has been created using Conservation officer, historic buildings. Errors have been sought and corrected,but may not always be located. Such creation errors do not reflect on the standard of care.   Author: Arnetha Courser, MD 02/12/2024 1:42 PM  For on call review [email or web address].

## 2024-02-12 NOTE — Progress Notes (Addendum)
 Nutrition Follow-up  DOCUMENTATION CODES:   Not applicable  INTERVENTION:   -TF via g-tube:    1 carton (237 ml) Pediasure Grow and Gain 5 times daily (237 ml 5 times daily- administered 237 ml/hr for 1 hour each feed)    50 ml free water flush before and after each feeding   Tube feeding regimen provides 1200 kcal (100% of needs), 35 grams of protein.  NUTRITION DIAGNOSIS:   Inadequate oral intake related to dysphagia as evidenced by NPO status.  Ongoing  GOAL:   Patient will meet greater than or equal to 90% of their needs  Unmet  MONITOR:   TF tolerance  REASON FOR ASSESSMENT:   Consult Enteral/tube feeding initiation and management  ASSESSMENT:   Pt with medical history significant for Duchenne's muscular dystrophy, autism with intellectual disability, seizure disorder, obstructive and restrictive lung disease BiPAP, chronic constipation on chronic laxatives, G-tube dependent, last hospitalized in December 2023 with sepsis secondary to pneumonia, who was brought in due to concern for abdominal distention noticed in the past 1 day PTA.  3/1- code blue, transferred to ICU  Reviewed I/O's: +419 ml x 24 hours and -2.7 L since admission  UOP: 100 ml x 24 hours  Pt currently on HHFNC.   Case discussed with MD and pharmacy. Pt's TF were not started over the weekend and pt mom did not accept the Pediasure Enteral formula. RD discussed with pharmacy purchasing agent- plan to have pt's home formula (PediaSure Grow and Gain) couriered over today. RD adjusted TF ordered to reflect this.   Per MD and RN, pt mom does not want bolus feeds. RD adjusted orders to infuse boluses per pump.   Labs reviewed: K: 3.1, CBGS: 54-273 (inpatient orders for glycemic control are none).    Diet Order:   Diet Order             Diet NPO time specified  Diet effective now                   EDUCATION NEEDS:   Education needs have been addressed  Skin:  Skin Assessment:  Reviewed RN Assessment  Last BM:  Unknown  Height:   Ht Readings from Last 1 Encounters:  02/08/24 5' (1.524 m)    Weight:   Wt Readings from Last 1 Encounters:  02/08/24 61.7 kg    Ideal Body Weight:  45.5 kg  BMI:  Body mass index is 26.56 kg/m.  Estimated Nutritional Needs:   Kcal:  1250-1450  Protein:  70-85 grams  Fluid:  > 1.2 L    Levada Schilling, RD, LDN, CDCES Registered Dietitian III Certified Diabetes Care and Education Specialist If unable to reach this RD, please use "RD Inpatient" group chat on secure chat between hours of 8am-4 pm daily

## 2024-02-12 NOTE — Plan of Care (Signed)
 Patient remains on 2L Nasal Cannula. Oxygen saturation remains at 100%. Tube feed started at 220ml/hr.   Problem: Clinical Measurements: Goal: Diagnostic test results will improve Outcome: Progressing   Problem: Respiratory: Goal: Ability to maintain adequate ventilation will improve Outcome: Progressing

## 2024-02-13 ENCOUNTER — Other Ambulatory Visit: Payer: Self-pay

## 2024-02-13 DIAGNOSIS — R0682 Tachypnea, not elsewhere classified: Secondary | ICD-10-CM | POA: Diagnosis not present

## 2024-02-13 DIAGNOSIS — K5900 Constipation, unspecified: Secondary | ICD-10-CM | POA: Diagnosis not present

## 2024-02-13 DIAGNOSIS — R000 Tachycardia, unspecified: Secondary | ICD-10-CM | POA: Diagnosis not present

## 2024-02-13 DIAGNOSIS — R6510 Systemic inflammatory response syndrome (SIRS) of non-infectious origin without acute organ dysfunction: Secondary | ICD-10-CM | POA: Diagnosis not present

## 2024-02-13 LAB — CULTURE, BLOOD (ROUTINE X 2)
Culture: NO GROWTH
Culture: NO GROWTH

## 2024-02-13 LAB — CBC
HCT: 41.6 % (ref 39.0–52.0)
Hemoglobin: 13.8 g/dL (ref 13.0–17.0)
MCH: 30.4 pg (ref 26.0–34.0)
MCHC: 33.2 g/dL (ref 30.0–36.0)
MCV: 91.6 fL (ref 80.0–100.0)
Platelets: 191 K/uL (ref 150–400)
RBC: 4.54 MIL/uL (ref 4.22–5.81)
RDW: 13.2 % (ref 11.5–15.5)
WBC: 6.9 K/uL (ref 4.0–10.5)
nRBC: 0 % (ref 0.0–0.2)

## 2024-02-13 LAB — BASIC METABOLIC PANEL
Anion gap: 10 (ref 5–15)
BUN: 6 mg/dL (ref 6–20)
CO2: 33 mmol/L — ABNORMAL HIGH (ref 22–32)
Calcium: 9 mg/dL (ref 8.9–10.3)
Chloride: 96 mmol/L — ABNORMAL LOW (ref 98–111)
Creatinine, Ser: <0.3 mg/dL — ABNORMAL LOW (ref 0.61–1.24)
Glucose, Bld: 60 mg/dL — ABNORMAL LOW (ref 70–99)
Potassium: 4 mmol/L (ref 3.5–5.1)
Sodium: 139 mmol/L (ref 135–145)

## 2024-02-13 LAB — PHOSPHORUS: Phosphorus: 3 mg/dL (ref 2.5–4.6)

## 2024-02-13 LAB — MAGNESIUM: Magnesium: 1.8 mg/dL (ref 1.7–2.4)

## 2024-02-13 MED ORDER — METOPROLOL TARTRATE 50 MG PO TABS
50.0000 mg | ORAL_TABLET | Freq: Two times a day (BID) | ORAL | 1 refills | Status: AC
  Filled 2024-02-13: qty 60, 30d supply, fill #0

## 2024-02-13 NOTE — Plan of Care (Signed)
  Problem: Respiratory: Goal: Ability to maintain adequate ventilation will improve Outcome: Progressing   Problem: Education: Goal: Knowledge of General Education information will improve Description: Including pain rating scale, medication(s)/side effects and non-pharmacologic comfort measures Outcome: Progressing   Problem: Health Behavior/Discharge Planning: Goal: Ability to manage health-related needs will improve Outcome: Progressing   Problem: Clinical Measurements: Goal: Diagnostic test results will improve Outcome: Progressing

## 2024-02-13 NOTE — Plan of Care (Signed)

## 2024-02-13 NOTE — TOC Progression Note (Addendum)
 Transition of Care [facility]) - Progression Note    Patient Details  Name: Lucas [name] MRN: [id number] Date of Birth: 2000/12/30  Transition of Care [facility] CM/SW Contact  Margarito Liner, LCSW Phone Number: 02/13/2024, 1:01 PM  Clinical Narrative:  Mom does not have a way home. CSW faxed cab voucher to [employer]. RN will notify CSW when they arrive so that EMS transport can be set up for patient. Patient does not qualify for home oxygen and mom stated she cannot private pay. CSW encouraged her to reach out to his PCP if his saturations decrease to a level where he will qualify. CSW faxed discharge summary to private duty nursing agency.  Expected Discharge Plan: Home w Home Health Services Barriers to Discharge: Continued Medical Work up  Expected Discharge Plan and Services       Living arrangements for the past 2 months: Single Family Home Expected Discharge Date: 02/13/24                                     Social Determinants of Health (SDOH) Interventions SDOH Screenings   Food Insecurity: No Food Insecurity (02/11/2024)  Housing: Low Risk  (02/11/2024)  Transportation Needs: No Transportation Needs (02/11/2024)  Utilities: Not At Risk (02/11/2024)  Tobacco Use: Medium Risk (12/17/2023)  Health Literacy: Low Risk  (03/21/2021)   Received from [facility], [facility]    Readmission Risk Interventions     No data to display

## 2024-02-13 NOTE — Consult Note (Signed)
 Medications delivered to room.   Paschal Dopp, PharmD, BCPS

## 2024-02-13 NOTE — Discharge Summary (Addendum)
 Physician Discharge Summary   Patient: Lucas [name] MRN: [id number] DOB: [birth year]  Admit date:     02/08/2024  Discharge date: 02/13/24  Discharge Physician: Arnetha Courser   PCP: Kandyce Rud, MD   Recommendations at discharge:  Please obtain CBC and BMP on follow-up Please make sure that patient is using BiPAP at night and while taking naps during the day to prevent buildup of CO2 Follow-up with his neurologist for further recommendations as he might be approaching to become vent dependent due to his underlying disease. Follow-up with primary care provider Follow-up with his neurologist. Patient will resume his private nursing on discharge.  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  Patient also tachypneic and tachycardic.  CT scan of the abdomen did not show any acute infectious process.  D-dimer was negative.  Patient was started on aggressive antibiotics.  Patient did have a bowel movement with enema.  2/28.  Downgraded antibiotics to Rocephin and Zithromax.  Will use lactulose twice today for bowel movement.  Started urgently this  morning on BiPAP with CO2 retention of 73 and patient being lethargic.  3/1: Potassium 2.3 with magnesium of 1.5-replating electrolytes.  Mentation seems to be at baseline now.  Chest x-ray without any infiltrate.  Remained tachycardic so metoprolol dose was increased.  Discontinuing antibiotic. Patient likely needs some oxygen with BiPAP.  Discussed with mother to use BiPAP for few hours during the day.  Mother to discuss with neurology as he was already told that he is approaching a trach and ventilatory support due to weakening diaphragm.  Patient will follow-up with his neurologist for further recommendation.  3/2: Patient had unexpected respiratory arrest requiring CPR in the late afternoon of 3/1.  Recovered quickly.  Very conflicting potassium, it was 2.3  in the morning and was replaced with 60 mEq through PEG tube, after CPR labs were redrawn and they came back more than 7.5 followed by 6.5 on repeat.  Patient received hyperkalemia management.  Pharmacy was consulted to monitor his electrolytes carefully.  Likely a lab error as there is very significant variation which does not make sense.  3/3: Patient is again at baseline, weaning off from oxygen as he was on 2 L of oxygen before his respiratory arrest on 3/1.  Patient likely need BiPAP during night and while taking naps during the day.  Repeat VBG yesterday with normal pCO2. Mother again asked about putting a trach in.  Patient appears stable and comfortable, there is no emergent need for tracheostomy at this time but patient likely will approach to that point due to his progressive Duchenne muscular dystrophy.  Discussed with pulmonology. That better be done by his neurologist and outpatient ENT. Restarting tube feed.  3/4: Patient remained hemodynamically stable.  He was saturating well on  room air.  He is being given home oxygen to use as needed.  Mother was instructed to use BiPAP during night and while taking naps during day to prevent  buildup of CO2 and follow-up with his neurologist for further recommendations.  Patient was also given metoprolol to help with sinus tachycardia.  He will continue the rest of his care and medications and follow-up with his providers for further assistance.  Assessment and Plan: * Acute respiratory failure with hypoxia and hypercapnia (HCC) With CO2 73 on admission.  Repeat VBG  with improved CO2. -Continue BiPAP at night and while taking naps during the day  SIRS (systemic inflammatory response syndrome) (HCC) I do not have a source of infection.  Patient had tachycardia and tachypnea and slight leukocytosis.  Procalcitonin is negative.  Repeat chest x-ray without any infiltrate.  CT scan of the abdomen does not show infectious source.  Urine analysis also negative. -Discontinuing antibiotics  Sinus tachycardia Improved D-dimer negative which goes against pulmonary embolism.   Continue with metoprolol .  Electrolyte abnormality Patient with significant variations in potassium results which does not make much sense. Currently potassium normal -Replace electrolytes per pharmacy as needed  Constipation Abdominal distention Gastrostomy status CT abdomen and pelvis was unrevealing Started getting bowel movement with lactulose.   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      Consultants: PCCM Procedures performed: None Disposition: Home Diet recommendation:  Discharge Diet Orders (From admission, onward)     Start     Ordered    02/13/24 0000  Diet - low sodium heart healthy        02/13/24 1050           NPO patient is on tube feed DISCHARGE MEDICATION: Allergies as of 02/13/2024       Reactions   Lisinopril Rash   Red bubbly rash   Penicillins Hives, Rash   Has patient had a PCN reaction causing immediate rash, facial/tongue/throat swelling, SOB or lightheadedness with hypotension: Yes Has patient had a PCN reaction causing severe rash involving mucus membranes or skin necrosis: Yes Has patient had a PCN reaction that required hospitalization: No Has patient had a PCN reaction occurring within the last 10 years: No If all of the above answers are "NO", then may proceed with Cephalosporin use.   Docusate Sodium    Rash and cant sleep   Metamucil [psyllium] Other (See Comments)   "Interacted with seizure medicine"   Multivitamins    Red in face, itchy, bumps   Other Other (See Comments)   Senkot -- gets rash and cant sleep   Aloe Vera Rash   Mom report "red little dots" after applying onto face   Calcitonin Rash   Esomeprazole Magnesium Nausea And Vomiting   Omeprazole Other (See Comments)   Constipation and possible rash after drug was stopped   Simethicone Palpitations, Rash   Sunflower Oil Itching, Rash, Other (See Comments)   GI upset - Avoid products containing this additive   Vitamin D Analogs Other (See Comments)   Rash on face if it contains sunflower seed oil Excessive urine output and thirsty, increase in gas  Medication List     TAKE these medications    acetaminophen 160 MG/5ML elixir Commonly known as: TYLENOL Take 20.3 mLs (650 mg total) by mouth every 4 (four) hours as needed for pain.   carbamazepine 100 MG chewable tablet Commonly known as: TEGRETOL TAKE 1 & 1/2 TABLETS BY MOUTH TWICE A DAY   GoodSense ClearLax 17 GM/SCOOP powder Generic drug: polyethylene glycol powder TAKE 17 GM BY MOUTH TWICE DAILY AS DIRECTED WITH PRUNE JUICE   levalbuterol 1.25 MG/3ML  nebulizer solution Commonly known as: XOPENEX Take 1.25 mg by nebulization every 4 (four) hours as needed.   metoprolol tartrate 50 MG tablet Commonly known as: LOPRESSOR Place 1 tablet (50 mg total) into feeding tube 2 (two) times daily.   nystatin powder Commonly known as: MYCOSTATIN/NYSTOP Apply 1 Application topically 3 (three) times daily as needed.   PediaSure Grow & Gain Liqd Drink 1 bottle spaced out 4 times a day   prednisoLONE 15 MG/5ML solution Commonly known as: ORAPRED PLACE 3 ML INTO FEEDING TUBE DAILY               Durable Medical Equipment  (From admission, onward)           Start     Ordered   02/10/24 1054  For home use only DME oxygen  Once       Question Answer Comment  Length of Need Lifetime   Mode or (Route) Nasal cannula   Liters per Minute 2   Frequency Continuous (stationary and portable oxygen unit needed)   Oxygen conserving device Yes   Oxygen delivery system Gas      02/10/24 1054            Follow-up Information     Kandyce Rud, MD. Schedule an appointment as soon as possible for a visit in 1 week(s).   Specialty: Family Medicine Contact information: [address] [facility] [place] [address] [phone]         Margurite Auerbach, MD .   Specialty: Pediatric Neurology Contact information: [address] [place] [address] [phone]                Discharge Exam: Ceasar Mons Weights   02/08/24 2243 02/13/24 0500  Weight: 61.7 kg 62 kg   General.  Cognitively impaired and quadriplegic gentleman, in no acute distress. Pulmonary.  Lungs clear bilaterally, normal respiratory effort. CV.  Regular rate and rhythm, no JVD, rub or murmur. Abdomen.  Soft, nontender, nondistended, BS positive.  PEG tube is in CNS.  Alert with multiple contractures Extremities.  No edema, no cyanosis, pulses intact and symmetrical.  Condition at discharge:  stable  The results of significant diagnostics from this hospitalization (including imaging, microbiology, ancillary and laboratory) are listed below for reference.   Imaging Studies: DG Chest Port 1 View Result Date: 02/10/2024 CLINICAL DATA:  Hypoxia. History of due shins muscular dystrophy, Oda Kilts some and seizure disorder with restrictive lung disease. EXAM: PORTABLE CHEST 1 VIEW COMPARISON:  02/08/2024 FINDINGS: Stable cardiomediastinal contours. Low lung volumes. Trace bilateral pleural effusions. No interstitial edema or airspace consolidation. Spinal fusion hardware. No acute osseous findings., IMPRESSION: Low lung volumes and trace bilateral pleural effusions. Electronically Signed   By: Signa Kell M.D.   On: 02/10/2024 10:56   CT ABDOMEN PELVIS W CONTRAST Result Date: 02/09/2024 CLINICAL DATA:  Acute abdominal pain EXAM: CT ABDOMEN AND PELVIS WITH CONTRAST TECHNIQUE: Multidetector CT  imaging of the abdomen and pelvis was performed using the standard protocol following bolus administration of intravenous contrast. RADIATION DOSE REDUCTION: This exam was performed according to the departmental dose-optimization program which includes automated exposure control, adjustment of the mA and/or kV according to patient size and/or use of iterative reconstruction technique. CONTRAST:  OMNIPAQUE IOHEXOL 300 MG/ML  SOLN COMPARISON:  11/19/2022 FINDINGS: Lower chest: Lung bases are free of acute infiltrate. A very small right-sided pleural effusion is noted. Hepatobiliary: No focal liver abnormality is seen. No gallstones, gallbladder wall thickening, or biliary dilatation. Pancreas: Unremarkable. No pancreatic ductal dilatation or surrounding inflammatory changes. Spleen: Normal in size without focal abnormality. Adrenals/Urinary Tract: Adrenal glands are within normal limits. Kidneys demonstrate a normal enhancement pattern bilaterally. Scattered nonobstructing right renal calculi are noted measuring  2-3 mm. No obstructive changes are seen. The bladder is well distended. Stomach/Bowel: No obstructive or inflammatory changes of the colon are seen. The appendix is within normal limits. Gastrostomy catheter is noted in place. Small bowel is within normal limits. Vascular/Lymphatic: No significant vascular findings are present. No enlarged abdominal or pelvic lymph nodes. Reproductive: Prostate is unremarkable. Other: No abdominal wall hernia or abnormality. No abdominopelvic ascites. Musculoskeletal: Postsurgical changes are noted in the thoracolumbar spine. Scoliosis is seen concave to the left. IMPRESSION: Nonobstructing right renal calculi. No other focal acute abnormality is noted Electronically Signed   By: Alcide Clever M.D.   On: 02/09/2024 00:59   DG Chest Port 1 View Result Date: 02/09/2024 CLINICAL DATA:  Constipation, shortness of breath, acting different, muscular dystrophy, questionable sepsis EXAM: PORTABLE CHEST 1 VIEW COMPARISON:  Radiograph and CT 11/19/2022 FINDINGS: Stable cardiomediastinal silhouette. Low lung volumes. No focal consolidation, pleural effusion, or pneumothorax. Spinal fusion hardware. No displaced rib fractures. IMPRESSION: Low lung volumes without acute cardiopulmonary disease. Electronically Signed   By: Minerva Fester M.D.   On: 02/09/2024 00:48    Microbiology: Results for orders placed or performed during the hospital encounter of 02/08/24  Blood Culture (routine x 2)     Status: None   Collection Time: 02/08/24 11:34 PM   Specimen: BLOOD  Result Value Ref Range Status   Specimen Description BLOOD BLOOD RIGHT ARM  Final   Special Requests   Final    BOTTLES DRAWN AEROBIC AND ANAEROBIC Blood Culture results may not be optimal due to an inadequate volume of blood received in culture bottles   Culture   Final    NO GROWTH 5 DAYS Performed at [facility], [address] [place], [place] [address]    Report Status 02/13/2024 FINAL  Final  Blood  Culture (routine x 2)     Status: None   Collection Time: 02/08/24 11:34 PM   Specimen: BLOOD  Result Value Ref Range Status   Specimen Description BLOOD BLOOD RIGHT HAND  Final   Special Requests   Final    BOTTLES DRAWN AEROBIC AND ANAEROBIC Blood Culture results may not be optimal due to an inadequate volume of blood received in culture bottles   Culture   Final    NO GROWTH 5 DAYS Performed at [facility], [address] [place], [place] [address]    Report Status 02/13/2024 FINAL  Final  Resp panel by RT-PCR (RSV, Flu A&B, Covid) Anterior Nasal Swab     Status: None   Collection Time: 02/08/24 11:34 PM   Specimen: Anterior Nasal Swab  Result Value Ref Range Status   SARS Coronavirus 2 by RT PCR  NEGATIVE NEGATIVE Final    Comment: (NOTE) SARS-CoV-2 target nucleic acids are NOT DETECTED.  The SARS-CoV-2 RNA is generally detectable in upper respiratory specimens during the acute phase of infection. The lowest concentration of SARS-CoV-2 viral copies this assay can detect is 138 copies/mL. A negative result does not preclude SARS-Cov-2 infection and should not be used as the sole basis for treatment or other patient management decisions. A negative result may occur with  improper specimen collection/handling, submission of specimen other than nasopharyngeal swab, presence of viral mutation(s) within the areas targeted by this assay, and inadequate number of viral copies(<138 copies/mL). A negative result must be combined with clinical observations, patient history, and epidemiological information. The expected result is Negative.  Fact Sheet for Patients:  BloggerCourse.com  Fact Sheet for Healthcare Providers:  SeriousBroker.it  This test is no t yet approved or cleared by the [place] FDA and  has been authorized for detection and/or diagnosis of SARS-CoV-2 by FDA under an Emergency Use Authorization (EUA).  This EUA will remain  in effect (meaning this test can be used) for the duration of the COVID-19 declaration under Section 564(b)(1) of the Act, 21 U.S.C.section 360bbb-3(b)(1), unless the authorization is terminated  or revoked sooner.       Influenza A by PCR NEGATIVE NEGATIVE Final   Influenza B by PCR NEGATIVE NEGATIVE Final    Comment: (NOTE) The Xpert Xpress SARS-CoV-2/FLU/RSV plus assay is intended as an aid in the diagnosis of influenza from Nasopharyngeal swab specimens and should not be used as a sole basis for treatment. Nasal washings and aspirates are unacceptable for Xpert Xpress SARS-CoV-2/FLU/RSV testing.  Fact Sheet for Patients: BloggerCourse.com  Fact Sheet for Healthcare Providers: SeriousBroker.it  This test is not yet approved or cleared by the [place] FDA and has been authorized for detection and/or diagnosis of SARS-CoV-2 by FDA under an Emergency Use Authorization (EUA). This EUA will remain in effect (meaning this test can be used) for the duration of the COVID-19 declaration under Section 564(b)(1) of the Act, 21 U.S.C. section 360bbb-3(b)(1), unless the authorization is terminated or revoked.     Resp Syncytial Virus by PCR NEGATIVE NEGATIVE Final    Comment: (NOTE) Fact Sheet for Patients: BloggerCourse.com  Fact Sheet for Healthcare Providers: SeriousBroker.it  This test is not yet approved or cleared by the [place] FDA and has been authorized for detection and/or diagnosis of SARS-CoV-2 by FDA under an Emergency Use Authorization (EUA). This EUA will remain in effect (meaning this test can be used) for the duration of the COVID-19 declaration under Section 564(b)(1) of the Act, 21 U.S.C. section 360bbb-3(b)(1), unless the authorization is terminated or revoked.  Performed at [facility], [address]  [place], [place] [address]   MRSA Next Gen by PCR, Nasal     Status: None   Collection Time: 02/10/24  3:42 PM   Specimen: Nasal Mucosa; Nasal Swab  Result Value Ref Range Status   MRSA by PCR Next Gen NOT DETECTED NOT DETECTED Final    Comment: (NOTE) The GeneXpert MRSA Assay (FDA approved for NASAL specimens only), is one component of a comprehensive MRSA colonization surveillance program. It is not intended to diagnose MRSA infection nor to guide or monitor treatment for MRSA infections. Test performance is not FDA approved in patients less than 59 years old. Performed at [facility], [address] [place], [place] [address]   Respiratory (~20 pathogens) panel by PCR     Status:  None   Collection Time: 02/10/24  6:19 PM   Specimen: Nasopharyngeal Swab; Respiratory  Result Value Ref Range Status   Adenovirus NOT DETECTED NOT DETECTED Final   Coronavirus 229E NOT DETECTED NOT DETECTED Final    Comment: (NOTE) The Coronavirus on the Respiratory Panel, DOES NOT test for the novel  Coronavirus (2019 nCoV)    Coronavirus HKU1 NOT DETECTED NOT DETECTED Final   Coronavirus NL63 NOT DETECTED NOT DETECTED Final   Coronavirus OC43 NOT DETECTED NOT DETECTED Final   Metapneumovirus NOT DETECTED NOT DETECTED Final   Rhinovirus / Enterovirus NOT DETECTED NOT DETECTED Final   Influenza A NOT DETECTED NOT DETECTED Final   Influenza B NOT DETECTED NOT DETECTED Final   Parainfluenza Virus 1 NOT DETECTED NOT DETECTED Final   Parainfluenza Virus 2 NOT DETECTED NOT DETECTED Final   Parainfluenza Virus 3 NOT DETECTED NOT DETECTED Final   Parainfluenza Virus 4 NOT DETECTED NOT DETECTED Final   Respiratory Syncytial Virus NOT DETECTED NOT DETECTED Final   Bordetella pertussis NOT DETECTED NOT DETECTED Final   Bordetella Parapertussis NOT DETECTED NOT DETECTED Final   Chlamydophila pneumoniae NOT DETECTED NOT DETECTED Final   Mycoplasma pneumoniae NOT DETECTED NOT DETECTED Final     Comment: Performed at [facility], [address] [place], [place] [address]    Labs: CBC: Recent Labs  Lab 02/08/24 2300 02/10/24 0723 02/11/24 0502 02/12/24 0324 02/13/24 0511  WBC 11.5* 9.0 9.4 8.0 6.9  NEUTROABS 8.7*  --   --   --   --   HGB 16.5 16.1 14.7 14.5 13.8  HCT 49.0 47.1 44.2 43.9 41.6  MCV 92.3 89.9 91.5 91.6 91.6  PLT 227 166 175 194 191   Basic Metabolic Panel: Recent Labs  Lab 02/10/24 0723 02/10/24 1514 02/10/24 1645 02/10/24 1953 02/11/24 0027 02/11/24 0502 02/11/24 1643 02/12/24 0324 02/13/24 0511  NA 135 135   < > 138  --  140 138 141 139  K 2.3* >7.5*   < > 5.0 4.7 4.0 3.4* 3.1* 4.0  CL 90* 92*   < > 89*  --  88* 89* 91* 96*  CO2 32 36*   < > 39*  --  39* 27 34* 33*  GLUCOSE 117* 312*   < > 123*  --  75 65* 54* 60*  BUN <5* <5*   < > <5*  --  <5* 6 7 6   CREATININE <0.30* <0.30*   < > <0.30*  --  <0.30* <0.30* <0.30* <0.30*  CALCIUM 8.9 8.6*   < > 10.2  --  9.3 8.7* 8.9 9.0  MG 1.5*  --   --   --   --  1.7  --  2.0 1.8  PHOS  --  4.2  --   --   --  3.2  --   --  3.0   < > = values in this interval not displayed.   Liver Function Tests: Recent Labs  Lab 02/08/24 2300 02/10/24 1953  AST 52* 109*  ALT 53* 73*  ALKPHOS 94 89  BILITOT 0.6 1.0  PROT 7.2 7.0  ALBUMIN 4.2 3.9   CBG: Recent Labs  Lab 02/10/24 1500 02/10/24 1538 02/12/24 0831 02/12/24 0856 02/12/24 1005  GLUCAP 231* 251* 54* 273* 170*    Discharge time spent: greater than 30 minutes.  This record has been created using Conservation officer, historic buildings. Errors have been sought and corrected,but may not always be located. Such creation errors  do not reflect on the standard of care.   Signed: Arnetha Courser, MD [facility] 02/13/2024

## 2024-02-13 NOTE — Progress Notes (Signed)
 Brief Nutrition Follow-Up Note  RD received secure chat from pharmacy, MD, and RN regarding pt's TF formula. Per RN, no formula available currently for 1000 feeding.   Spoke with pharmacy purchasing, who reports formula has been ordered through [employer] and shipment is expected to arrive at 10:30 AM today. 4 cans of formula were also couriered over from [name] yesterday. Of note, pt mom is not open to formulary substitutions and unable to provide formula from home. Information communicated to RN, MD, pharmacy, and nursing director.   INTERVENTION:    -TF via g-tube:    1 carton (237 ml) Pediasure Grow and Gain 5 times daily (237 ml 5 times daily- administered 237 ml/hr for 1 hour each feed)    50 ml free water flush before and after each feeding   Tube feeding regimen provides 1200 kcal (100% of needs), 35 grams of protein.  Levada Schilling, RD, LDN, CDCES Registered Dietitian III Certified Diabetes Care and Education Specialist If unable to reach this RD, please use "RD Inpatient" group chat on secure chat between hours of 8am-4 pm daily

## 2024-02-13 NOTE — Progress Notes (Signed)
 SATURATION QUALIFICATIONS: (This note is used to comply with regulatory documentation for home oxygen)  Patient Saturations on Room Air at Rest = 91%  Patient Saturations on 2L at Rest = 99%

## 2024-02-13 NOTE — TOC Transition Note (Signed)
 Transition of Care [facility]) - Discharge Note   Patient Details  Name: Lucas [name] MRN: [id number] Date of Birth: Nov 01, 2001  [facility] of [facility]) CM/SW Contact:  Margarito Liner, LCSW Phone Number: 02/13/2024, 2:33 PM   Clinical Narrative:  Patient has orders to discharge home today. RN confirmed mom has been picked up by taxi. [facility] has been arranged. Tried calling mom to notify but number is not in service. Also left voicemail for private duty nurse agency. No further concerns. CSW signing off.   Final next level of care: Home/Self Care Barriers to Discharge: Barriers Resolved   Patient Goals and CMS Choice Patient states their goals for this hospitalization and ongoing recovery are:: home with mother CMS Medicare.gov Compare Post Acute Care list provided to:: Patient Represenative (must comment) Choice offered to / list presented to : Parent      Discharge Placement                Patient to be transferred to facility by: [facility] Name of family member notified: Lucas [name] Patient and family notified of of transfer: 02/13/24  Discharge Plan and Services Additional resources added to the After Visit Summary for Autism/IDD                                     Social Drivers of Health (SDOH) Interventions SDOH Screenings   Food Insecurity: No Food Insecurity (02/11/2024)  Housing: Low Risk  (02/11/2024)  Transportation Needs: No Transportation Needs (02/11/2024)  Utilities: Not At Risk (02/11/2024)  Tobacco Use: Medium Risk (12/17/2023)  Health Literacy: Low Risk  (03/21/2021)   Received from [facility], [facility]     Readmission Risk Interventions     No data to display

## 2024-02-15 NOTE — Addendum Note (Signed)
 Addended by: Margurite Auerbach on: 02/15/2024 11:53 PM   Modules accepted: Orders

## 2024-02-16 NOTE — Progress Notes (Addendum)
 Patient: Lucas [name] MRN: [id number] Sex: male DOB: 01/30/2001  Provider: Marny Sires, MD Location of Care: [facility]- Pediatric [facility] Care Note type: Routine return visit   This is a Pediatric Specialist E-Visit follow up consult provided via MyChart Lucas [name] and their parent/guardian Lucas [name] consented to an E-Visit consult today.  Location of patient: Lucas [name] is at home in [place], [place]  Location of provider: Marny Sires, MD is at [facility]  The following participants were involved in this E-Visit:  Lucas Sires, MD, Lucas [name], CMA, Lucas [name], Scribe, Lucas [name], patient, and their parent/guardian Lucas [name]  This visit was done via VIDEO    History of Present Illness: Referral Source: Lucas Gardner, MD  History from: patient and prior records Chief Complaint: Pediatric Complex Care  Lucas [name] is a 23 y.o. male with history of Duchenne Muscular Dystrophy, autism spectrum disorder, level 3, restrictive lung disease, compensated congestive heart failure, severe dysphagia requiring tube feeding, and obstructive sleep apnea who I am seeing in follow-up for complex care management. Patient was last seen on 11/27/2023 where I increased prednisone, continued Tegratol, and recommended follow up with GI, pulmonology, cardiology, and the [facility] with Dr. Jiles Mote. Since that appointment, patient was hospitalized on 02/08/2024 for constipation and was tachypneic and tachycardic where they recommended Lucas [name] wear his Bi-PAP while sleeping and discuss that he might be approaching becoming vent dependent with neurology.   Patient presents today with mother who reports the following:   Symptom management:   Discussed recent hospitalization with mother.  Per her, while in the hospital, patient had pulmonary arrest. He was sent to ICU. Had high potassium on initial labs but normal when they rechecked. Mom unhappy with  hospitalization, went to hospital for constipation and CO2 was 73, they don't know why it happened, had angina attack while in the hospital and started him on a new medication  Will pull off BiPAP at night, sleeps with it during the day, O2 dropped after coming home from the hospital but now resolved, possible gas leak in the house. Lucas [name] has been feeling better since that was fixed  Had gastroenteritis and was having diarrhea and foul-smelling stool, possibly due to CO poisoning, stopped Miralax  due to diarrhea, was having diarrhea around hard stool at hospital  Now using 1 capful Miralax  every other day and 1 glassful prune juice daily, started metamucil but had to stop because it was binding to seizure meds. Not able to reach GI for recommendations while in the hospital  Has not had seizures, changed the times of the meds to 10 pm   Hospital said he was doing well with the BiPAP, one day he may need a trach and mom wants to proceed with that when the time comes  Care coordination (other providers): Patient saw Lucas [name] on 02/06/2024 where he let mom know that Lucas [name] likely had viral gastroenteritis, which was likely the reason for a higher heart rate. He recommended metamucil and miralax  daily.   Patient is scheduled with pulmonology on March 11, 2024 and cardiology on March 18, 2024. GI would only see him in person so mom did not want an appointment. Referred to [name] GI. [facility] requested mom call to make an appointment.   Case management needs:  No new needs discussed today  Equipment needs:  Ordered a pulse ox and planned to reach out to [employer] and [employer] about patient's hoyer lift.   Decision making/Advanced  care planning: Discussed possibility of trach in the future. Mom would like to proceed with that at that time.   Past Medical History Past Medical History:  Diagnosis Date   Autism    Autism    Community acquired pneumonia of left lower lobe of  lung 03/03/2022   Diarrhea 06/09/2015   Family history of adverse reaction to anesthesia    MGGM- N/V   Muscular dystrophy (HCC)    Scoliosis    Seizures (HCC)    Last one 2013   Sepsis (HCC) 03/03/2022   Transient alteration of awareness    Viral gastroenteritis    Yeast infection of the skin 10/24/2020    Surgical History Past Surgical History:  Procedure Laterality Date   CIRCUMCISION     at birth   LAPAROSCOPIC GASTROSTOMY N/A 05/24/2017   Procedure: LAPAROSCOPIC GASTROSTOMY TUBE PLACEMENT;  Surgeon: Verlena Glenn, MD;  Location: [name];  Service: General;  Laterality: N/A;   SPINE SURGERY N/A    Phreesia 06/29/2020    Family History family history includes Alcohol abuse in his father; Asthma in an other family member; Cancer in his paternal grandfather; Dementia in his paternal grandmother; Heart failure in his maternal grandmother; Hyperlipidemia in his mother and another family member; Hypertension in his maternal grandmother and another family member; Learning disabilities in his mother; Other in his maternal aunt, maternal grandmother, maternal uncle, and mother; Vision loss in an other family member.   Social History Social History   Social History Narrative   He lives with mother.   He graduated high school in 22.    He is not currently in a day program     Allergies Allergies  Allergen Reactions   Lisinopril Rash    Red bubbly rash   Penicillins Hives and Rash    Has patient had a PCN reaction causing immediate rash, facial/tongue/throat swelling, SOB or lightheadedness with hypotension: Yes Has patient had a PCN reaction causing severe rash involving mucus membranes or skin necrosis: Yes Has patient had a PCN reaction that required hospitalization: No Has patient had a PCN reaction occurring within the last 10 years: No If all of the above answers are "NO", then may proceed with Cephalosporin use.    Apple Juice    Docusate Sodium      Rash and cant sleep    Metamucil [Psyllium] Other (See Comments)    "Interacted with seizure medicine"   Multivitamins     Red in face, itchy, bumps   Other Other (See Comments)    Senkot -- gets rash and cant sleep   Aloe Vera Rash    Mom report "red little dots" after applying onto face   Calcitonin Rash   Esomeprazole Magnesium  Nausea And Vomiting   Omeprazole Other (See Comments)    Constipation and possible rash after drug was stopped   Simethicone  Palpitations and Rash   Sunflower Oil Itching, Rash and Other (See Comments)    GI upset - Avoid products containing this additive   Vitamin D  Analogs Other (See Comments)    Rash on face if it contains sunflower seed oil Excessive urine output and thirsty, increase in gas    Medications Current Outpatient Medications on File Prior to Visit  Medication Sig Dispense Refill   acetaminophen  (TYLENOL ) 160 MG/5ML elixir Take 20.3 mLs (650 mg total) by mouth every 4 (four) hours as needed for pain. (Patient taking differently: Take 650 mg by mouth every 6 (six) hours as needed for pain.)  120 mL 0   carbamazepine  (TEGRETOL ) 100 MG chewable tablet TAKE 1 & 1/2 TABLETS BY MOUTH TWICE A DAY 270 tablet 2   levalbuterol  (XOPENEX ) 1.25 MG/3ML nebulizer solution Take 1.25 mg by nebulization every 4 (four) hours as needed.     metoprolol  tartrate (LOPRESSOR ) 50 MG tablet Place 1 tablet (50 mg total) into feeding tube 2 (two) times daily. 60 tablet 1   Nutritional Supplements (PEDIASURE GROW & GAIN) LIQD Drink 1 bottle spaced out 4 times a day 28440 mL 12   nystatin  (MYCOSTATIN /NYSTOP ) powder Apply 1 Application topically 3 (three) times daily as needed.     No current facility-administered medications on file prior to visit.   The medication list was reviewed and reconciled. All changes or newly prescribed medications were explained.  A complete medication list was provided to the patient/caregiver.  Physical Exam Wt 136 lb (61.7 kg)   BMI 26.56 kg/m  Weight for  age: Facility age limit for growth %iles is 20 years.  Length for age: Facility age limit for growth %iles is 20 years. BMI: Body mass index is 26.56 kg/m. No results found. Exam limited by virtual visit.  Patient wheelchair dependent, awake and alert.  able to voice single word responses on request.    Diagnosis:  1. Obstructive sleep apnea syndrome   2. Constipation, unspecified constipation type   3. Partial epilepsy with impairment of consciousness   4. Duchenne muscular dystrophy (HCC)      Assessment and Plan KAY RICCIUTI is a 22 y.o. male with history of Duchenne Muscular Dystrophy, autism spectrum disorder, level 3, restrictive lung disease, compensated congestive heart failure, severe dysphagia requiring tube feeding, and obstructive sleep apnea who presents for follow-up in the pediatric complex care clinic.  Symptom management:  Patient doing well on current medication regimen. Discussed his bowel regimen and clarified what he should be receiving. Discussed that Marlan does not need sodium in his feeds anymore as his labs were normal in the hospital when he was off of it. Recommended that Lucas [name] check Lawrence's sodium levels when he orders labs. Had previously ordered a sleep study to evaluate his BiPAP needs, I encouraged keepigng this appointment. Referred to GI to discuss Brandt's bowel regimen. Reviewed his upcoming appointments.   Continue medications Continue giving 1 capful of Miralax  every other day and give 8 oz of prune juice daily. We will send orders for Donevin's nurses Stop the sodium in Zeferino's feeds. We will send an updated order  We will make sure that Lucas [name] checks Tamir's labs and his sodium Ordered a sleep study. We will follow up to make sure it is scheduled  Care coordination: We will follow up on the [facility] GI referral You have an upcoming appointment with Lucas [name] on 02/26/2024 at 11 am, the pulmonologist on 03/11/2024 at 2 pm, and  the cardiologist on 03/18/2024 at 2 pm  Case management needs:  No new case management needs  Equipment needs:  Due to patient's medical condition, patient is indefinitely incontinent of stool and urine.  It is medically necessary for them to use diapers, underpads, and gloves to assist with hygiene and skin integrity.  They require a frequency of up to 200 a month.  Decision making/Advanced care planning: Mom would like to Hashim to get a trach if necessary. MOST form on file  The CARE PLAN for reviewed and revised to represent the changes above.  This is available in Epic under snapshot, and a physical  binder provided to the patient, that can be used for anyone providing care for the patient.    I spend 40 minutes on day of service on this patient including review of chart, discussion with patient and family, coordination with other providers and management of orders and paperwork.   Return in about 3 months (around 05/21/2024).  I, Lucas [name], scribed for and in the presence of Lucas Sires, MD at today's visit on 02/19/2024.  I, Lucas Sires MD MPH, personally performed the services described in this documentation, as scribed by Lucas [name] in my presence on 02/19/2024 and it is accurate, complete, and reviewed by me.    Lucas Sires MD MPH Neurology,  Neurodevelopment and Neuropalliative care [facility] Child Neurology  [address] [place], [place], [place] [address] Phone: [phone]

## 2024-02-19 ENCOUNTER — Encounter (INDEPENDENT_AMBULATORY_CARE_PROVIDER_SITE_OTHER): Payer: Self-pay | Admitting: Pediatrics

## 2024-02-19 ENCOUNTER — Telehealth (INDEPENDENT_AMBULATORY_CARE_PROVIDER_SITE_OTHER): Payer: Self-pay | Admitting: Pediatrics

## 2024-02-19 VITALS — Wt 136.0 lb

## 2024-02-19 DIAGNOSIS — G7101 Duchenne or Becker muscular dystrophy: Secondary | ICD-10-CM

## 2024-02-19 DIAGNOSIS — G4733 Obstructive sleep apnea (adult) (pediatric): Secondary | ICD-10-CM | POA: Diagnosis not present

## 2024-02-19 DIAGNOSIS — K5900 Constipation, unspecified: Secondary | ICD-10-CM | POA: Diagnosis not present

## 2024-02-19 DIAGNOSIS — G40209 Localization-related (focal) (partial) symptomatic epilepsy and epileptic syndromes with complex partial seizures, not intractable, without status epilepticus: Secondary | ICD-10-CM | POA: Diagnosis not present

## 2024-02-19 NOTE — Patient Instructions (Addendum)
 Symptom management: Continue medications Continue giving 1 capful of Miralax every other day and give 8 oz of prune juice daily. We will send orders for Lucas [name]'s nurses Stop the sodium in Lucas [name]'s feeds. We will send an updated order  We will make sure that Dr. Larwance Sachs checks Lucas [name]'s labs and his sodium Ordered a sleep study. We will follow up to make sure it is scheduled Care Coordination: We will follow up on the [facility] GI referral You have an upcoming appointment with Dr. Larwance Sachs on 02/26/2024 at 11 am, the pulmonologist on 03/11/2024 at 2 pm, and the cardiologist on 03/18/2024 at 2 pm

## 2024-02-21 ENCOUNTER — Telehealth (INDEPENDENT_AMBULATORY_CARE_PROVIDER_SITE_OTHER): Payer: Self-pay | Admitting: Pediatrics

## 2024-02-21 NOTE — Telephone Encounter (Signed)
 I called and approved reducing the prune juice dose to 5oz. TG

## 2024-02-21 NOTE — Telephone Encounter (Signed)
  Name of who is calling: Jake   Caller's Relationship to Patient: nurse   Best contact number: [phone]  Provider they see: Artis Flock  Reason for call: Care team & mom wanted to know if they can cut down the amount of prune juice due to the pt's bowels being runny. They wanted to cut it down form 8oz to about 5oz. Please give them a call     PRESCRIPTION REFILL ONLY  Name of prescription:  Pharmacy:

## 2024-03-11 ENCOUNTER — Institutional Professional Consult (permissible substitution): Payer: Medicaid Other | Admitting: Internal Medicine

## 2024-03-11 ENCOUNTER — Ambulatory Visit: Admitting: Sleep Medicine

## 2024-03-11 ENCOUNTER — Encounter: Payer: Self-pay | Admitting: Sleep Medicine

## 2024-03-11 VITALS — BP 110/60 | HR 101 | Temp 96.9°F | Ht 60.0 in | Wt 113.3 lb

## 2024-03-11 DIAGNOSIS — G4733 Obstructive sleep apnea (adult) (pediatric): Secondary | ICD-10-CM

## 2024-03-11 DIAGNOSIS — G7101 Duchenne or Becker muscular dystrophy: Secondary | ICD-10-CM

## 2024-03-11 NOTE — Patient Instructions (Signed)

## 2024-03-11 NOTE — Progress Notes (Signed)
 Name:Lucas [name] MRN: [id number] DOB: 12/10/2001   CHIEF COMPLAINT:  EXCESSIVE DAYTIME SLEEPINESS   HISTORY OF PRESENT ILLNESS:  Lucas [name] is a 23 y.o. w/ a h/o Duchenne muscular dystrophy, intellectual disability, autism and epilepsy who presents to establish care for OSA. Patient's mother and his nurse are present during visit. Reports that he was initially diagnosed with OSA during childhood and subsequently started on CPAP therapy. Patient's mother reports that a PAP titration was completed in 2021 and changed over to BIPAP therapy. Patient is using BIPAP therapy 10/6 cm H2O with a full face mask. He is currently using the [facility], which has caused significant skin breakdown. Patient's mother reports that Lucas [name] is more awake and alert with PAP therapy. States that patient naps for a few hours during the day. Reports a family history of sleep apnea.   Bedtime 6-11 pm Sleep onset 10 mins Rise time 8-9 am  PAST MEDICAL HISTORY :   has a past medical history of Autism, Autism, Community acquired pneumonia of left lower lobe of lung (03/03/2022), Diarrhea (06/09/2015), Family history of adverse reaction to anesthesia, Muscular dystrophy (HCC), Scoliosis, Seizures (HCC), Sepsis (HCC) (03/03/2022), Transient alteration of awareness, Viral gastroenteritis, and Yeast infection of the skin (10/24/2020).  has a past surgical history that includes Circumcision; Laparoscopic gastrostomy (N/A, 05/24/2017); and Spine surgery (N/A). Prior to Admission medications   Medication Sig Start Date End Date Taking? Authorizing Provider  acetaminophen (TYLENOL) 160 MG/5ML elixir Take 20.3 mLs (650 mg total) by mouth every 4 (four) hours as needed for pain. 12/02/22   Pilar Jarvis, MD  carbamazepine (TEGRETOL) 100 MG chewable tablet TAKE 1 & 1/2 TABLETS BY MOUTH TWICE A DAY 11/27/23   Margurite Auerbach, MD  levalbuterol Pauline Aus) 1.25 MG/3ML nebulizer solution Take 1.25 mg by nebulization every  4 (four) hours as needed. 03/03/22   [provider]  metoprolol tartrate (LOPRESSOR) 50 MG tablet Place 1 tablet (50 mg total) into feeding tube 2 (two) times daily. 02/13/24   Arnetha Courser, MD  Nutritional Supplements (PEDIASURE GROW & GAIN) LIQD Drink 1 bottle spaced out 4 times a day 05/29/23   Margurite Auerbach, MD  nystatin (MYCOSTATIN/NYSTOP) powder Apply 1 Application topically 3 (three) times daily as needed. 08/02/22   [provider]  polyethylene glycol powder (GOODSENSE CLEARLAX) 17 GM/SCOOP powder TAKE 17 GM BY MOUTH TWICE DAILY AS DIRECTED WITH PRUNE JUICE 10/31/23   Vanga, Loel Dubonnet, MD  prednisoLONE (ORAPRED) 15 MG/5ML solution PLACE 3 ML INTO FEEDING TUBE DAILY 12/07/23   Margurite Auerbach, MD   Allergies  Allergen Reactions   Lisinopril Rash    Red bubbly rash   Penicillins Hives and Rash    Has patient had a PCN reaction causing immediate rash, facial/tongue/throat swelling, SOB or lightheadedness with hypotension: Yes Has patient had a PCN reaction causing severe rash involving mucus membranes or skin necrosis: Yes Has patient had a PCN reaction that required hospitalization: No Has patient had a PCN reaction occurring within the last 10 years: No If all of the above answers are "NO", then may proceed with Cephalosporin use.    Docusate Sodium     Rash and cant sleep   Metamucil [Psyllium] Other (See Comments)    "Interacted with seizure medicine"   Multivitamins     Red in face, itchy, bumps   Other Other (See Comments)    Senkot -- gets rash and cant sleep   Aloe Dwana Curd  Rash    Mom report "red little dots" after applying onto face   Calcitonin Rash   Esomeprazole Magnesium Nausea And Vomiting   Omeprazole Other (See Comments)    Constipation and possible rash after drug was stopped   Simethicone Palpitations and Rash   Sunflower Oil Itching, Rash and Other (See Comments)    GI upset - Avoid products containing this additive   Vitamin D  Analogs Other (See Comments)    Rash on face if it contains sunflower seed oil Excessive urine output and thirsty, increase in gas    FAMILY HISTORY:  family history includes Alcohol abuse in his father; Asthma in an other family member; Cancer in his paternal grandfather; Dementia in his paternal grandmother; Heart failure in his maternal grandmother; Hyperlipidemia in his mother and another family member; Hypertension in his maternal grandmother and another family member; Learning disabilities in his mother; Other in his maternal aunt, maternal grandmother, maternal uncle, and mother; Vision loss in an other family member. SOCIAL HISTORY:  reports that he has never smoked. He has been exposed to tobacco smoke. He has never used smokeless tobacco. He reports that he does not drink alcohol and does not use drugs.   Review of Systems:  Gen:  Denies  fever, sweats, chills weight loss  HEENT: Denies blurred vision, double vision, ear pain, eye pain, hearing loss, nose bleeds, sore throat Cardiac:  No dizziness, chest pain or heaviness, chest tightness,edema, No JVD Resp:   No cough, -sputum production, -shortness of breath,-wheezing, -hemoptysis,  Gi: Denies swallowing difficulty, stomach pain, nausea or vomiting, diarrhea, constipation, bowel incontinence Gu:  Denies bladder incontinence, burning urine Ext:   Denies Joint pain, stiffness or swelling Skin: Denies  skin rash, easy bruising or bleeding or hives Endoc:  Denies polyuria, polydipsia , polyphagia or weight change Psych:   Denies depression, insomnia or hallucinations  Other:  All other systems negative  VITAL SIGNS: BP 110/60 (BP Location: Right Arm, Cuff Size: Normal)   Pulse (!) 101   Temp (!) 96.9 F (36.1 C)   Ht 5' (1.524 m)   Wt 113 lb 4.5 oz (51.4 kg) Comment: per mother. patient in a wheelchair today  SpO2 95%   BMI 22.12 kg/m     Physical Examination:   General Appearance: No distress  EYES PERRLA, EOM intact.    NECK Supple, No JVD Pulmonary: normal breath sounds, No wheezing.  CardiovascularNormal S1,S2.  No m/r/g.   Abdomen: Benign, Soft, non-tender. Skin:   warm, no rashes, no ecchymosis  Extremities: normal, no cyanosis, clubbing. Neuro:without focal findings,  speech normal  PSYCHIATRIC: Mood, affect within normal limits.   ASSESSMENT AND PLAN  OSA Patient is using and benefiting from PAP therapy. Reviewed compliance data with patient's caretakers. No adjustments made to settings today. For skin breakdown, tried fitting patient with an under the nose/full face mask but patient was intolerant of this mask. Ended up fitting with the medium Amara Full face mask and also sent order to DME company. Discussed the consequences of untreated sleep apnea. Advised patient's caretakers to only keep PAP mask on while patient is sleeping. Will follow up in 6 months to review PAP efficacy and compliance data.   Duchenne muscular dystrophy Stable, following with neurology.    Patient's caretakers satisfied with Plan of action and management. All questions answered  I spent a total of 61 minutes reviewing chart data, face-to-face evaluation with the patient, counseling and coordination of care as detailed above.  Tempie Hoist, M.D.  Sleep Medicine [facility]

## 2024-03-12 ENCOUNTER — Other Ambulatory Visit: Payer: Self-pay

## 2024-03-16 NOTE — Progress Notes (Deleted)
 Cardiology Office Note  Date:  03/16/2024   ID:  Lucas [name], DOB 2001/12/05, MRN [id number]  PCP:  Kandyce Rud, MD   No chief complaint on file.   HPI:  Mr. Lucas [name] is a 23 year old gentleman with past medical history of Duchenne muscular dystrophy Intellectual disability/autism/epilepsy History of pneumonia March 2023/sepsis Obstructive sleep apnea Who presents by referral from Dr. Larwance Sachs for sinus tachycardia  Echocardiogram April 2024 at [facility] normal ejection fraction otherwise no other abnormalities  Echo January 11, 2022 [facility] Normal study  PMH:   has a past medical history of Autism, Autism, Community acquired pneumonia of left lower lobe of lung (03/03/2022), Diarrhea (06/09/2015), Family history of adverse reaction to anesthesia, Muscular dystrophy (HCC), Scoliosis, Seizures (HCC), Sepsis (HCC) (03/03/2022), Transient alteration of awareness, Viral gastroenteritis, and Yeast infection of the skin (10/24/2020).  PSH:    Past Surgical History:  Procedure Laterality Date   CIRCUMCISION     at birth   LAPAROSCOPIC GASTROSTOMY N/A 05/24/2017   Procedure: LAPAROSCOPIC GASTROSTOMY TUBE PLACEMENT;  Surgeon: Kandice Hams, MD;  Location: [name];  Service: General;  Laterality: N/A;   SPINE SURGERY N/A    Phreesia 06/29/2020    Current Outpatient Medications  Medication Sig Dispense Refill   acetaminophen (TYLENOL) 160 MG/5ML elixir Take 20.3 mLs (650 mg total) by mouth every 4 (four) hours as needed for pain. (Patient taking differently: Take 650 mg by mouth every 6 (six) hours as needed for pain.) 120 mL 0   carbamazepine (TEGRETOL) 100 MG chewable tablet TAKE 1 & 1/2 TABLETS BY MOUTH TWICE A DAY 270 tablet 2   levalbuterol (XOPENEX) 1.25 MG/3ML nebulizer solution Take 1.25 mg by nebulization every 4 (four) hours as needed.     metoprolol tartrate (LOPRESSOR) 50 MG tablet Place 1 tablet (50 mg total) into feeding tube 2 (two) times daily. 60 tablet 1   Nutritional  Supplements (PEDIASURE GROW & GAIN) LIQD Drink 1 bottle spaced out 4 times a day 28440 mL 12   nystatin (MYCOSTATIN/NYSTOP) powder Apply 1 Application topically 3 (three) times daily as needed.     polyethylene glycol powder (GOODSENSE CLEARLAX) 17 GM/SCOOP powder TAKE 17 GM BY MOUTH TWICE DAILY AS DIRECTED WITH PRUNE JUICE 3060 g 0   prednisoLONE (ORAPRED) 15 MG/5ML solution PLACE 3 ML INTO FEEDING TUBE DAILY 270 mL 2   ZINC OXIDE, TOPICAL, EX Apply topically once daily as needed Rash around G-tube and perineal area     No current facility-administered medications for this visit.     Allergies:   Lisinopril, Penicillins, Docusate sodium, Metamucil [psyllium], Multivitamins, Other, Aloe vera, Calcitonin, Esomeprazole magnesium, Omeprazole, Simethicone, Sunflower oil, and Vitamin d analogs   Social History:  The patient  reports that he has never smoked. He has been exposed to tobacco smoke. He has never used smokeless tobacco. He reports that he does not drink alcohol and does not use drugs.   Family History:   family history includes Alcohol abuse in his father; Asthma in an other family member; Cancer in his paternal grandfather; Dementia in his paternal grandmother; Heart failure in his maternal grandmother; Hyperlipidemia in his mother and another family member; Hypertension in his maternal grandmother and another family member; Learning disabilities in his mother; Other in his maternal aunt, maternal grandmother, maternal uncle, and mother; Vision loss in an other family member.    Review of Systems: ROS   PHYSICAL EXAM: VS:  There were no vitals taken for this visit. ,  BMI There is no height or weight on file to calculate BMI. GEN: Well nourished, well developed, in no acute distress HEENT: normal Neck: no JVD, carotid bruits, or masses Cardiac: RRR; no murmurs, rubs, or gallops,no edema  Respiratory:  clear to auscultation bilaterally, normal work of breathing GI: soft, nontender,  nondistended, + BS MS: no deformity or atrophy Skin: warm and dry, no rash Neuro:  Strength and sensation are intact Psych: euthymic mood, full affect    Recent Labs: 02/09/2024: TSH 0.874 02/10/2024: ALT 73 02/13/2024: BUN 6; Creatinine, Ser <0.30; Hemoglobin 13.8; Magnesium 1.8; Platelets 191; Potassium 4.0; Sodium 139    Lipid Panel No results found for: "CHOL", "HDL", "LDLCALC", "TRIG"    Wt Readings from Last 3 Encounters:  03/11/24 113 lb 4.5 oz (51.4 kg)  02/19/24 136 lb (61.7 kg)  02/13/24 136 lb 11 oz (62 kg)       ASSESSMENT AND PLAN:  Problem List Items Addressed This Visit   None    Disposition:   F/U  12 months   Total encounter time more than 30 minutes  Greater than 50% was spent in counseling and coordination of care with the patient    Signed, Dossie Arbour, M.D., Ph.D. [facility] [place], [place] [phone]

## 2024-03-18 ENCOUNTER — Ambulatory Visit: Payer: Medicaid Other | Admitting: Cardiovascular Disease

## 2024-03-23 ENCOUNTER — Emergency Department

## 2024-03-23 ENCOUNTER — Inpatient Hospital Stay
Admission: EM | Admit: 2024-03-23 | Discharge: 2024-03-26 | DRG: 871 | Disposition: A | Discharge status: Home Health | Attending: Student | Admitting: Student

## 2024-03-23 ENCOUNTER — Other Ambulatory Visit: Payer: Self-pay

## 2024-03-23 ENCOUNTER — Encounter: Payer: Self-pay | Admitting: Emergency Medicine

## 2024-03-23 DIAGNOSIS — J984 Other disorders of lung: Secondary | ICD-10-CM

## 2024-03-23 DIAGNOSIS — Z83438 Family history of other disorder of lipoprotein metabolism and other lipidemia: Secondary | ICD-10-CM

## 2024-03-23 DIAGNOSIS — M419 Scoliosis, unspecified: Secondary | ICD-10-CM | POA: Diagnosis present

## 2024-03-23 DIAGNOSIS — Z8269 Family history of other diseases of the musculoskeletal system and connective tissue: Secondary | ICD-10-CM

## 2024-03-23 DIAGNOSIS — J9601 Acute respiratory failure with hypoxia: Secondary | ICD-10-CM | POA: Diagnosis present

## 2024-03-23 DIAGNOSIS — Z888 Allergy status to other drugs, medicaments and biological substances status: Secondary | ICD-10-CM

## 2024-03-23 DIAGNOSIS — F79 Unspecified intellectual disabilities: Secondary | ICD-10-CM | POA: Diagnosis present

## 2024-03-23 DIAGNOSIS — Z79899 Other long term (current) drug therapy: Secondary | ICD-10-CM

## 2024-03-23 DIAGNOSIS — R000 Tachycardia, unspecified: Secondary | ICD-10-CM | POA: Diagnosis present

## 2024-03-23 DIAGNOSIS — Z9102 Food additives allergy status: Secondary | ICD-10-CM

## 2024-03-23 DIAGNOSIS — G7101 Duchenne or Becker muscular dystrophy: Secondary | ICD-10-CM | POA: Diagnosis present

## 2024-03-23 DIAGNOSIS — R1310 Dysphagia, unspecified: Secondary | ICD-10-CM | POA: Diagnosis present

## 2024-03-23 DIAGNOSIS — M24561 Contracture, right knee: Secondary | ICD-10-CM | POA: Diagnosis present

## 2024-03-23 DIAGNOSIS — Z87440 Personal history of urinary (tract) infections: Secondary | ICD-10-CM

## 2024-03-23 DIAGNOSIS — F809 Developmental disorder of speech and language, unspecified: Secondary | ICD-10-CM | POA: Diagnosis present

## 2024-03-23 DIAGNOSIS — R532 Functional quadriplegia: Secondary | ICD-10-CM | POA: Diagnosis present

## 2024-03-23 DIAGNOSIS — Z821 Family history of blindness and visual loss: Secondary | ICD-10-CM

## 2024-03-23 DIAGNOSIS — Z1152 Encounter for screening for COVID-19: Secondary | ICD-10-CM

## 2024-03-23 DIAGNOSIS — E876 Hypokalemia: Secondary | ICD-10-CM | POA: Diagnosis present

## 2024-03-23 DIAGNOSIS — R092 Respiratory arrest: Secondary | ICD-10-CM

## 2024-03-23 DIAGNOSIS — A419 Sepsis, unspecified organism: Principal | ICD-10-CM | POA: Diagnosis present

## 2024-03-23 DIAGNOSIS — R21 Rash and other nonspecific skin eruption: Secondary | ICD-10-CM | POA: Diagnosis present

## 2024-03-23 DIAGNOSIS — Z825 Family history of asthma and other chronic lower respiratory diseases: Secondary | ICD-10-CM

## 2024-03-23 DIAGNOSIS — Z880 Allergy status to penicillin: Secondary | ICD-10-CM

## 2024-03-23 DIAGNOSIS — Z91018 Allergy to other foods: Secondary | ICD-10-CM

## 2024-03-23 DIAGNOSIS — Z91048 Other nonmedicinal substance allergy status: Secondary | ICD-10-CM

## 2024-03-23 DIAGNOSIS — J449 Chronic obstructive pulmonary disease, unspecified: Secondary | ICD-10-CM | POA: Diagnosis present

## 2024-03-23 DIAGNOSIS — F840 Autistic disorder: Secondary | ICD-10-CM | POA: Diagnosis present

## 2024-03-23 DIAGNOSIS — Z820 Family history of epilepsy and other diseases of the nervous system: Secondary | ICD-10-CM

## 2024-03-23 DIAGNOSIS — Z811 Family history of alcohol abuse and dependence: Secondary | ICD-10-CM

## 2024-03-23 DIAGNOSIS — R509 Fever, unspecified: Secondary | ICD-10-CM

## 2024-03-23 DIAGNOSIS — Z8249 Family history of ischemic heart disease and other diseases of the circulatory system: Secondary | ICD-10-CM

## 2024-03-23 DIAGNOSIS — G7100 Muscular dystrophy, unspecified: Secondary | ICD-10-CM

## 2024-03-23 DIAGNOSIS — G4733 Obstructive sleep apnea (adult) (pediatric): Secondary | ICD-10-CM | POA: Diagnosis present

## 2024-03-23 DIAGNOSIS — G40909 Epilepsy, unspecified, not intractable, without status epilepticus: Secondary | ICD-10-CM | POA: Diagnosis present

## 2024-03-23 DIAGNOSIS — M24562 Contracture, left knee: Secondary | ICD-10-CM | POA: Diagnosis present

## 2024-03-23 DIAGNOSIS — R7401 Elevation of levels of liver transaminase levels: Secondary | ICD-10-CM | POA: Diagnosis present

## 2024-03-23 DIAGNOSIS — Z809 Family history of malignant neoplasm, unspecified: Secondary | ICD-10-CM

## 2024-03-23 DIAGNOSIS — E162 Hypoglycemia, unspecified: Secondary | ICD-10-CM | POA: Diagnosis present

## 2024-03-23 DIAGNOSIS — Z931 Gastrostomy status: Secondary | ICD-10-CM

## 2024-03-23 LAB — RESP PANEL BY RT-PCR (RSV, FLU A&B, COVID)  RVPGX2
Influenza A by PCR: NEGATIVE
Influenza B by PCR: NEGATIVE
Resp Syncytial Virus by PCR: NEGATIVE
SARS Coronavirus 2 by RT PCR: NEGATIVE

## 2024-03-23 LAB — COMPREHENSIVE METABOLIC PANEL WITH GFR
ALT: 63 U/L — ABNORMAL HIGH (ref 0–44)
AST: 45 U/L — ABNORMAL HIGH (ref 15–41)
Albumin: 4.4 g/dL (ref 3.5–5.0)
Alkaline Phosphatase: 81 U/L (ref 38–126)
Anion gap: 12 (ref 5–15)
BUN: 7 mg/dL (ref 6–20)
CO2: 29 mmol/L (ref 22–32)
Calcium: 9.2 mg/dL (ref 8.9–10.3)
Chloride: 95 mmol/L — ABNORMAL LOW (ref 98–111)
Creatinine, Ser: <0.3 mg/dL — ABNORMAL LOW (ref 0.61–1.24)
Glucose, Bld: 117 mg/dL — ABNORMAL HIGH (ref 70–99)
Potassium: 3.5 mmol/L (ref 3.5–5.1)
Sodium: 136 mmol/L (ref 135–145)
Total Bilirubin: 0.6 mg/dL (ref 0.0–1.2)
Total Protein: 7.2 g/dL (ref 6.5–8.1)

## 2024-03-23 LAB — CBC WITH DIFFERENTIAL/PLATELET
Abs Immature Granulocytes: 0.08 10*3/uL — ABNORMAL HIGH (ref 0.00–0.07)
Basophils Absolute: 0.1 10*3/uL (ref 0.0–0.1)
Basophils Relative: 0 %
Eosinophils Absolute: 0 10*3/uL (ref 0.0–0.5)
Eosinophils Relative: 0 %
HCT: 44.7 % (ref 39.0–52.0)
Hemoglobin: 15.3 g/dL (ref 13.0–17.0)
Immature Granulocytes: 0 %
Lymphocytes Relative: 3 %
Lymphs Abs: 0.6 10*3/uL — ABNORMAL LOW (ref 0.7–4.0)
MCH: 30.7 pg (ref 26.0–34.0)
MCHC: 34.2 g/dL (ref 30.0–36.0)
MCV: 89.6 fL (ref 80.0–100.0)
Monocytes Absolute: 1.4 10*3/uL — ABNORMAL HIGH (ref 0.1–1.0)
Monocytes Relative: 8 %
Neutro Abs: 15.7 10*3/uL — ABNORMAL HIGH (ref 1.7–7.7)
Neutrophils Relative %: 89 %
Platelets: 218 10*3/uL (ref 150–400)
RBC: 4.99 MIL/uL (ref 4.22–5.81)
RDW: 13.4 % (ref 11.5–15.5)
WBC: 17.8 10*3/uL — ABNORMAL HIGH (ref 4.0–10.5)
nRBC: 0 % (ref 0.0–0.2)

## 2024-03-23 LAB — PROTIME-INR
INR: 1 (ref 0.8–1.2)
Prothrombin Time: 13.2 s (ref 11.4–15.2)

## 2024-03-23 LAB — URINALYSIS, W/ REFLEX TO CULTURE (INFECTION SUSPECTED)
Bacteria, UA: NONE SEEN
Bilirubin Urine: NEGATIVE
Glucose, UA: 50 mg/dL — AB
Hgb urine dipstick: NEGATIVE
Ketones, ur: NEGATIVE mg/dL
Leukocytes,Ua: NEGATIVE
Nitrite: NEGATIVE
Protein, ur: NEGATIVE mg/dL
Specific Gravity, Urine: 1.009 (ref 1.005–1.030)
Squamous Epithelial / HPF: 0 /HPF (ref 0–5)
pH: 7 (ref 5.0–8.0)

## 2024-03-23 LAB — LACTIC ACID, PLASMA: Lactic Acid, Venous: 1.3 mmol/L (ref 0.5–1.9)

## 2024-03-23 MED ORDER — LACTATED RINGERS IV BOLUS (SEPSIS)
1000.0000 mL | Freq: Once | INTRAVENOUS | Status: AC
  Administered 2024-03-23: 1000 mL via INTRAVENOUS

## 2024-03-23 MED ORDER — VANCOMYCIN HCL IN DEXTROSE 1-5 GM/200ML-% IV SOLN
1000.0000 mg | Freq: Once | INTRAVENOUS | Status: AC
  Administered 2024-03-23: 1000 mg via INTRAVENOUS
  Filled 2024-03-23: qty 200

## 2024-03-23 MED ORDER — SODIUM CHLORIDE 0.9 % IV SOLN
2.0000 g | Freq: Once | INTRAVENOUS | Status: AC
  Administered 2024-03-23: 2 g via INTRAVENOUS
  Filled 2024-03-23: qty 12.5

## 2024-03-23 MED ORDER — METRONIDAZOLE 500 MG/100ML IV SOLN
500.0000 mg | Freq: Once | INTRAVENOUS | Status: AC
  Administered 2024-03-23: 500 mg via INTRAVENOUS
  Filled 2024-03-23: qty 100

## 2024-03-23 NOTE — ED Provider Notes (Signed)
 [facility] Provider Note    Event Date/Time   First MD Initiated Contact with Patient 03/23/24 2152     (approximate)   History   Rash   HPI Lucas [name] is a 23 y.o. male with complex past medical history including Duchenne muscular dystrophy, autism spectrum disorder with significant intellectual and language deficits, restrictive lung disease, neuromuscular scoliosis, seizures, G-tube dependence presenting today for tachycardia.  Patient is here with legal guardian who states that she noticed him having an elevated heart rate as well as a rash on his arms.  She believes this is due to his Lopressor medication for which he has been on for the past 2 to 3 months.  No prior reaction to it.  No change to his baseline oxygen.  Legal guardian otherwise denies any other recent symptoms that she is aware of such as cough, congestion, shortness of breath, vomiting.  She does state he has a history of UTIs.  Chart review: Patient had a recent admission approximately 1 month ago with discharge on 02/13/2024 for acute hypoxic and hypercapnic respiratory failure.     Physical Exam   Triage Vital Signs: ED Triage Vitals  Encounter Vitals Group     BP 03/23/24 2154 (!) 155/103     Systolic BP Percentile --      Diastolic BP Percentile --      Pulse Rate 03/23/24 2157 (!) 160     Resp 03/23/24 2154 17     Temp 03/23/24 2154 (!) 101 F (38.3 C)     Temp Source 03/23/24 2154 Oral     SpO2 03/23/24 2154 97 %     Weight 03/23/24 2153 106 lb 14.8 oz (48.5 kg)     Height 03/23/24 2153 5' (1.524 m)     Head Circumference --      Peak Flow --      Pain Score --      Pain Loc --      Pain Education --      Exclude from Growth Chart --     Most recent vital signs: Vitals:   03/23/24 2154 03/23/24 2157  BP: (!) 155/103   Pulse:  (!) 160  Resp: 17   Temp: (!) 101 F (38.3 C)   SpO2: 97%    I have reviewed the vital signs. General:  Awake, alert, no acute  distress.  Essentially chronic quadriplegia Head:  Normocephalic, Atraumatic. EENT:  PERRL, EOMI, Oral mucosa pink and moist, Neck is supple. Cardiovascular: Tachycardic rate, 2+ distal pulses. Respiratory:  Normal respiratory effort, symmetrical expansion, slight tachypnea, no obvious crackles, wheezing, rhonchi. Extremities: Duchenne muscular dystrophy with minimal movement of his extremities. Neuro:  Alert.  Looks around the room.  Language deficit at baseline and unable to provide any other extensive communication Skin: Slight erythema noted throughout the arms.  No hives present.   ED Results / Procedures / Treatments   Labs (all labs ordered are listed, but only abnormal results are displayed) Labs Reviewed  RESP PANEL BY RT-PCR (RSV, FLU A&B, COVID)  [id number]  CULTURE, BLOOD (ROUTINE X 2)  CULTURE, BLOOD (ROUTINE X 2)  LACTIC ACID, PLASMA  LACTIC ACID, PLASMA  COMPREHENSIVE METABOLIC PANEL WITH GFR  CBC WITH DIFFERENTIAL/PLATELET  PROTIME-INR  URINALYSIS, W/ REFLEX TO CULTURE (INFECTION SUSPECTED)     EKG My EKG interpretation: Rate of 160, sinus tachycardia, normal axis, normal intervals.  No acute ST elevations or depressions   RADIOLOGY Independently interpreted chest  x-ray with no acute abnormalities   PROCEDURES:  Critical Care performed: Yes, see critical care procedure note(s)  .Critical Care  Performed by: Kandee Orion, MD Authorized by: Kandee Orion, MD   Critical care provider statement:    Critical care time (minutes):  30   Critical care was necessary to treat or prevent imminent or life-threatening deterioration of the following conditions:  Sepsis   Critical care was time spent personally by me on the following activities:  Development of treatment plan with patient or surrogate, discussions with consultants, evaluation of patient's response to treatment, examination of patient, ordering and review of laboratory studies, ordering and review of  radiographic studies, ordering and performing treatments and interventions, pulse oximetry, re-evaluation of patient's condition and review of old charts   I assumed direction of critical care for this patient from another provider in my specialty: no      MEDICATIONS ORDERED IN ED: Medications  lactated ringers bolus 1,000 mL (1,000 mLs Intravenous New Bag/Given 03/23/24 2254)  ceFEPIme (MAXIPIME) 2 g in sodium chloride 0.9 % 100 mL IVPB (2 g Intravenous New Bag/Given 03/23/24 2255)  metroNIDAZOLE (FLAGYL) IVPB 500 mg (has no administration in time range)  vancomycin (VANCOCIN) IVPB 1000 mg/200 mL premix (has no administration in time range)     IMPRESSION / MDM / ASSESSMENT AND PLAN / ED COURSE  I reviewed the triage vital signs and the nursing notes.                              Differential diagnosis includes, but is not limited to, pneumonia, UTI, bacteremia, sepsis, COVID, flu, RSV  Patient's presentation is most consistent with acute presentation with potential threat to life or bodily function.  Patient is a 23 year old male presenting today for tachycardia.  Mom noticed the rash on his arms.  However on arrival, he is febrile at 101 F, tachycardic at 160 concerning for sepsis.  Rash does not appear indicative of acute allergic reaction with no specific hives.  No wheezing seen.  Started on broad-spectrum antibiotics given significant chronic medical history.  Chest x-ray shows no infiltrates.  Patient signed out pending completion of sepsis workup and suspect likely admission.  The patient is on the cardiac monitor to evaluate for evidence of arrhythmia and/or significant heart rate changes.     FINAL CLINICAL IMPRESSION(S) / ED DIAGNOSES   Final diagnoses:  Sepsis, due to unspecified organism, unspecified whether acute organ dysfunction present (HCC)  Fever, unspecified fever cause     Rx / DC Orders   ED Discharge Orders     None        Note:  This document  was prepared using Dragon voice recognition software and may include unintentional dictation errors.   Kandee Orion, MD 03/23/24 [phone]

## 2024-03-23 NOTE — Progress Notes (Signed)
 CODE SEPSIS - PHARMACY COMMUNICATION  **Broad Spectrum Antibiotics should be administered within 1 hour of Sepsis diagnosis**  Time Code Sepsis Called/Page Received: 4/12 @ 2205  Antibiotics Ordered: Vanc, cefepime  Time of 1st antibiotic administration: Cefepime 2 gm IV X 1 on 4/12 @ 2255   Additional action taken by pharmacy:   If necessary, Name of Provider/Nurse Contacted:     Toryn Mcclinton D ,PharmD Clinical Pharmacist  03/23/2024  11:08 PM

## 2024-03-23 NOTE — ED Notes (Addendum)
 IV team at bedside. EMS primary RN had 2 unsuccessful IV start attempts delaying blood draw & medication administration. EDP aware.

## 2024-03-23 NOTE — ED Triage Notes (Signed)
 Pt arrives via ems reports rash this date starting post admin this date although pt taking Lopressor 50mg  BID since 02/12/2034 per ems pt VSS pt mother @ bs

## 2024-03-23 NOTE — Sepsis Progress Note (Signed)
 Elink following code sepsis

## 2024-03-24 ENCOUNTER — Inpatient Hospital Stay

## 2024-03-24 DIAGNOSIS — Z931 Gastrostomy status: Secondary | ICD-10-CM | POA: Diagnosis not present

## 2024-03-24 DIAGNOSIS — F840 Autistic disorder: Secondary | ICD-10-CM | POA: Diagnosis present

## 2024-03-24 DIAGNOSIS — J9601 Acute respiratory failure with hypoxia: Secondary | ICD-10-CM | POA: Diagnosis present

## 2024-03-24 DIAGNOSIS — Z888 Allergy status to other drugs, medicaments and biological substances status: Secondary | ICD-10-CM | POA: Diagnosis not present

## 2024-03-24 DIAGNOSIS — R1310 Dysphagia, unspecified: Secondary | ICD-10-CM | POA: Diagnosis present

## 2024-03-24 DIAGNOSIS — G40909 Epilepsy, unspecified, not intractable, without status epilepticus: Secondary | ICD-10-CM | POA: Diagnosis present

## 2024-03-24 DIAGNOSIS — Z91018 Allergy to other foods: Secondary | ICD-10-CM | POA: Diagnosis not present

## 2024-03-24 DIAGNOSIS — G7101 Duchenne or Becker muscular dystrophy: Secondary | ICD-10-CM | POA: Diagnosis present

## 2024-03-24 DIAGNOSIS — Z1152 Encounter for screening for COVID-19: Secondary | ICD-10-CM | POA: Diagnosis not present

## 2024-03-24 DIAGNOSIS — J984 Other disorders of lung: Secondary | ICD-10-CM | POA: Diagnosis present

## 2024-03-24 DIAGNOSIS — A419 Sepsis, unspecified organism: Principal | ICD-10-CM

## 2024-03-24 DIAGNOSIS — Z91048 Other nonmedicinal substance allergy status: Secondary | ICD-10-CM | POA: Diagnosis not present

## 2024-03-24 DIAGNOSIS — E162 Hypoglycemia, unspecified: Secondary | ICD-10-CM | POA: Diagnosis present

## 2024-03-24 DIAGNOSIS — Z880 Allergy status to penicillin: Secondary | ICD-10-CM | POA: Diagnosis not present

## 2024-03-24 DIAGNOSIS — E876 Hypokalemia: Secondary | ICD-10-CM | POA: Diagnosis present

## 2024-03-24 DIAGNOSIS — Z9102 Food additives allergy status: Secondary | ICD-10-CM | POA: Diagnosis not present

## 2024-03-24 DIAGNOSIS — R21 Rash and other nonspecific skin eruption: Secondary | ICD-10-CM | POA: Diagnosis present

## 2024-03-24 DIAGNOSIS — R7401 Elevation of levels of liver transaminase levels: Secondary | ICD-10-CM | POA: Diagnosis present

## 2024-03-24 DIAGNOSIS — F809 Developmental disorder of speech and language, unspecified: Secondary | ICD-10-CM | POA: Diagnosis present

## 2024-03-24 DIAGNOSIS — R092 Respiratory arrest: Secondary | ICD-10-CM

## 2024-03-24 DIAGNOSIS — R509 Fever, unspecified: Secondary | ICD-10-CM | POA: Diagnosis present

## 2024-03-24 DIAGNOSIS — R532 Functional quadriplegia: Secondary | ICD-10-CM | POA: Diagnosis present

## 2024-03-24 DIAGNOSIS — J449 Chronic obstructive pulmonary disease, unspecified: Secondary | ICD-10-CM | POA: Diagnosis present

## 2024-03-24 DIAGNOSIS — Z8249 Family history of ischemic heart disease and other diseases of the circulatory system: Secondary | ICD-10-CM | POA: Diagnosis not present

## 2024-03-24 DIAGNOSIS — F79 Unspecified intellectual disabilities: Secondary | ICD-10-CM | POA: Diagnosis present

## 2024-03-24 LAB — BLOOD GAS, VENOUS
Acid-Base Excess: 6 mmol/L — ABNORMAL HIGH (ref 0.0–2.0)
Bicarbonate: 32.2 mmol/L — ABNORMAL HIGH (ref 20.0–28.0)
O2 Saturation: 80.2 %
Patient temperature: 37
pCO2, Ven: 52 mmHg (ref 44–60)
pH, Ven: 7.4 (ref 7.25–7.43)
pO2, Ven: 45 mmHg (ref 32–45)

## 2024-03-24 LAB — BASIC METABOLIC PANEL WITH GFR
Anion gap: 9 (ref 5–15)
BUN: 7 mg/dL (ref 6–20)
CO2: 30 mmol/L (ref 22–32)
Calcium: 8.9 mg/dL (ref 8.9–10.3)
Chloride: 97 mmol/L — ABNORMAL LOW (ref 98–111)
Creatinine, Ser: <0.3 mg/dL — ABNORMAL LOW (ref 0.61–1.24)
Glucose, Bld: 110 mg/dL — ABNORMAL HIGH (ref 70–99)
Potassium: 3.2 mmol/L — ABNORMAL LOW (ref 3.5–5.1)
Sodium: 136 mmol/L (ref 135–145)

## 2024-03-24 LAB — CBC
HCT: 41.2 % (ref 39.0–52.0)
Hemoglobin: 13.9 g/dL (ref 13.0–17.0)
MCH: 31.1 pg (ref 26.0–34.0)
MCHC: 33.7 g/dL (ref 30.0–36.0)
MCV: 92.2 fL (ref 80.0–100.0)
Platelets: 176 10*3/uL (ref 150–400)
RBC: 4.47 MIL/uL (ref 4.22–5.81)
RDW: 13.3 % (ref 11.5–15.5)
WBC: 14.6 10*3/uL — ABNORMAL HIGH (ref 4.0–10.5)
nRBC: 0 % (ref 0.0–0.2)

## 2024-03-24 LAB — LACTIC ACID, PLASMA: Lactic Acid, Venous: 1.8 mmol/L (ref 0.5–1.9)

## 2024-03-24 LAB — RESPIRATORY PANEL BY PCR

## 2024-03-24 LAB — D-DIMER, QUANTITATIVE: D-Dimer, Quant: <0.27 ug{FEU}/mL (ref 0.00–0.50)

## 2024-03-24 LAB — MAGNESIUM: Magnesium: 1.8 mg/dL (ref 1.7–2.4)

## 2024-03-24 LAB — VANCOMYCIN, RANDOM: Vancomycin Rm: <4 ug/mL

## 2024-03-24 LAB — TSH: TSH: 0.93 u[IU]/mL (ref 0.350–4.500)

## 2024-03-24 LAB — PROCALCITONIN: Procalcitonin: <0.1 ng/mL

## 2024-03-24 LAB — PHOSPHORUS: Phosphorus: 2.7 mg/dL (ref 2.5–4.6)

## 2024-03-24 MED ORDER — METOPROLOL TARTRATE 5 MG/5ML IV SOLN: 2.5000 mg | Freq: Four times a day (QID) | INTRAVENOUS | Status: DC | PRN

## 2024-03-24 MED ORDER — ONDANSETRON HCL 4 MG/2ML IJ SOLN: 4.0000 mg | Freq: Four times a day (QID) | INTRAMUSCULAR | Status: DC | PRN

## 2024-03-24 MED ORDER — CARBAMAZEPINE 100 MG PO CHEW
300.0000 mg | CHEWABLE_TABLET | Freq: Two times a day (BID) | ORAL | Status: DC
  Administered 2024-03-24 (×2): 300 mg via ORAL
  Filled 2024-03-24 (×3): qty 3

## 2024-03-24 MED ORDER — METOPROLOL TARTRATE 50 MG PO TABS
50.0000 mg | ORAL_TABLET | Freq: Once | ORAL | Status: AC
Start: 2024-03-24 — End: 2024-03-24
  Administered 2024-03-24: 50 mg via ORAL
  Filled 2024-03-24: qty 1

## 2024-03-24 MED ORDER — HYDROXYZINE HCL 10 MG/5ML PO SYRP: 25.0000 mg | ORAL_SOLUTION | Freq: Three times a day (TID) | ORAL | Status: DC | PRN

## 2024-03-24 MED ORDER — MIDODRINE HCL 5 MG PO TABS: 10.0000 mg | ORAL_TABLET | Freq: Three times a day (TID) | ORAL | Status: DC

## 2024-03-24 MED ORDER — METOPROLOL TARTRATE 5 MG/5ML IV SOLN
5.0000 mg | Freq: Four times a day (QID) | INTRAVENOUS | Status: DC | PRN
  Administered 2024-03-25: 5 mg via INTRAVENOUS
  Filled 2024-03-24: qty 5

## 2024-03-24 MED ORDER — SODIUM CHLORIDE 0.9 % IV SOLN
2.0000 g | Freq: Three times a day (TID) | INTRAVENOUS | Status: DC
  Administered 2024-03-24 – 2024-03-25 (×4): 2 g via INTRAVENOUS
  Filled 2024-03-24 (×6): qty 12.5

## 2024-03-24 MED ORDER — METRONIDAZOLE 500 MG/100ML IV SOLN
500.0000 mg | Freq: Two times a day (BID) | INTRAVENOUS | Status: DC
  Administered 2024-03-24 – 2024-03-25 (×3): 500 mg via INTRAVENOUS
  Filled 2024-03-24 (×4): qty 100

## 2024-03-24 MED ORDER — VANCOMYCIN VARIABLE DOSE PER UNSTABLE RENAL FUNCTION (PHARMACIST DOSING): Status: DC

## 2024-03-24 MED ORDER — POTASSIUM CHLORIDE 20 MEQ PO PACK
60.0000 meq | PACK | Freq: Once | ORAL | Status: DC
Start: 2024-03-24 — End: 2024-03-24

## 2024-03-24 MED ORDER — ACETAMINOPHEN 325 MG PO TABS: 650.0000 mg | ORAL_TABLET | Freq: Four times a day (QID) | ORAL | Status: DC | PRN

## 2024-03-24 MED ORDER — HYDROXYZINE HCL 25 MG PO TABS: 25.0000 mg | ORAL_TABLET | Freq: Three times a day (TID) | ORAL | Status: DC | PRN

## 2024-03-24 MED ORDER — POTASSIUM CHLORIDE 20 MEQ PO PACK
40.0000 meq | PACK | Freq: Once | ORAL | Status: AC
  Administered 2024-03-24: 40 meq
  Filled 2024-03-24: qty 2

## 2024-03-24 MED ORDER — LACTATED RINGERS IV SOLN
150.0000 mL/h | INTRAVENOUS | Status: AC
  Administered 2024-03-24: 150 mL/h via INTRAVENOUS

## 2024-03-24 MED ORDER — VANCOMYCIN HCL 750 MG/150ML IV SOLN
750.0000 mg | Freq: Two times a day (BID) | INTRAVENOUS | Status: DC
  Administered 2024-03-24 – 2024-03-25 (×3): 750 mg via INTRAVENOUS
  Filled 2024-03-24 (×4): qty 150

## 2024-03-24 MED ORDER — BISACODYL 5 MG PO TBEC
10.0000 mg | DELAYED_RELEASE_TABLET | Freq: Every day | ORAL | Status: DC | PRN
  Filled 2024-03-24: qty 2

## 2024-03-24 MED ORDER — ENOXAPARIN SODIUM 40 MG/0.4ML IJ SOSY
40.0000 mg | PREFILLED_SYRINGE | INTRAMUSCULAR | Status: DC
  Administered 2024-03-24 – 2024-03-26 (×3): 40 mg via SUBCUTANEOUS
  Filled 2024-03-24 (×3): qty 0.4

## 2024-03-24 MED ORDER — ACETAMINOPHEN 650 MG RE SUPP: 650.0000 mg | Freq: Four times a day (QID) | RECTAL | Status: DC | PRN

## 2024-03-24 MED ORDER — ACETAMINOPHEN 325 MG PO TABS
650.0000 mg | ORAL_TABLET | Freq: Once | ORAL | Status: AC
  Administered 2024-03-24: 650 mg via ORAL
  Filled 2024-03-24: qty 2

## 2024-03-24 MED ORDER — BISACODYL 10 MG RE SUPP: 10.0000 mg | Freq: Every day | RECTAL | Status: DC | PRN

## 2024-03-24 MED ORDER — POLYETHYLENE GLYCOL 3350 17 G PO PACK
17.0000 g | PACK | Freq: Two times a day (BID) | ORAL | Status: DC
  Administered 2024-03-24 – 2024-03-26 (×5): 17 g via ORAL
  Filled 2024-03-24 (×5): qty 1

## 2024-03-24 MED ORDER — LEVALBUTEROL HCL 1.25 MG/0.5ML IN NEBU: 1.2500 mg | INHALATION_SOLUTION | RESPIRATORY_TRACT | Status: DC | PRN

## 2024-03-24 MED ORDER — MIDODRINE HCL 5 MG PO TABS
10.0000 mg | ORAL_TABLET | Freq: Three times a day (TID) | ORAL | Status: DC
  Administered 2024-03-24 – 2024-03-25 (×4): 10 mg
  Filled 2024-03-24 (×4): qty 2

## 2024-03-24 MED ORDER — ONDANSETRON HCL 4 MG PO TABS: 4.0000 mg | ORAL_TABLET | Freq: Four times a day (QID) | ORAL | Status: DC | PRN

## 2024-03-24 NOTE — H&P (Signed)
 History and Physical    Patient: Lucas [name]:[id number] DOB: 2001-05-21 DOA: 03/23/2024 DOS: the patient was seen and examined on 03/24/2024 PCP: Nestor Banter, MD  Patient coming from: Home  Chief Complaint:  Chief Complaint  Patient presents with   Rash    HPI: Lucas [name] is a 23 y.o. 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  Mother states patient has been on metoprolol since his hospitalization in March and it usually takes care of his tachycardia. He has had no cough or shortness of breath, vomiting or diarrhea. ED course and data review: Tmax 101, tachycardic to the 160s, BP 155/103, intermittently tachypneic to 53, O2 sat in the high 90s on room air. Labs with WBC 17,000, lactic acid pending Respiratory viral panel negative Mild transaminitis of AST/ALT of 45/63 with otherwise normal CMP Urinalysis sterile EKG showing sinus tachycardia at 160 with occasional VPCs and probably right ventricular hypertrophy Chest x-ray with no acute cardiopulmonary disease  Patient started on sepsis fluids and broad-spectrum antibiotics for sepsis of unknown source  Hospitalist consulted for admission.     Past Medical History:  Diagnosis Date   Autism    Autism    Community acquired pneumonia of left lower lobe of lung 03/03/2022   Diarrhea 06/09/2015   Family history of adverse reaction to anesthesia    MGGM- N/V   Muscular dystrophy (HCC)    Scoliosis    Seizures (HCC)    Last one 2013    Sepsis (HCC) 03/03/2022   Transient alteration of awareness    Viral gastroenteritis    Yeast infection of the skin 10/24/2020   Past Surgical History:  Procedure Laterality Date   CIRCUMCISION     at birth   LAPAROSCOPIC GASTROSTOMY N/A 05/24/2017   Procedure: LAPAROSCOPIC GASTROSTOMY TUBE PLACEMENT;  Surgeon: Verlena Glenn, MD;  Location: [name];  Service: General;  Laterality: N/A;   SPINE SURGERY N/A    Phreesia 06/29/2020   Social History:  reports that he has never smoked. He has been exposed to tobacco smoke. He has never used smokeless tobacco. He reports that he does not drink alcohol and does not use drugs.  Allergies  Allergen Reactions   Lisinopril Rash    Red bubbly rash   Penicillins Hives and Rash    Has patient had a PCN reaction causing immediate rash, facial/tongue/throat swelling, SOB or lightheadedness with hypotension: Yes Has patient had a PCN reaction causing severe rash involving mucus membranes or skin necrosis: Yes Has patient had a PCN reaction that required hospitalization: No Has patient had a PCN reaction occurring within the last 10 years: No If all of the above answers are "NO", then may proceed with Cephalosporin use.    Docusate Sodium     Rash and cant sleep   Metamucil [Psyllium] Other (See Comments)    "Interacted with seizure medicine"   Multivitamins     Red in face, itchy, bumps   Other Other (See Comments)    Senkot -- gets rash and cant sleep   Aloe Vera Rash    Mom report "  red little dots" after applying onto face   Calcitonin Rash   Esomeprazole Magnesium Nausea And Vomiting   Omeprazole Other (See Comments)    Constipation and possible rash after drug was stopped   Simethicone Palpitations and Rash   Sunflower Oil Itching, Rash and Other (See Comments)    GI upset - Avoid products containing this additive   Vitamin D Analogs Other (See Comments)    Rash on face if it contains sunflower seed oil Excessive urine output and  thirsty, increase in gas    Family History  Problem Relation Age of Onset   Cancer Paternal Grandfather        died at age 37   Dementia Paternal Grandmother        died at age 27   Other Mother        in special education, a carrier for Duchenne Muscular Dystrophy   Hyperlipidemia Mother    Learning disabilities Mother    Other Maternal Uncle        Duchenne Muscular Dystrophy, was in special educations   Other Maternal Aunt        was in special education   Other Maternal Grandmother        a carrier for Duchenne Muscular Dystrophy/Died due to septic shock at 23 years old    Heart failure Maternal Grandmother    Hypertension Maternal Grandmother    Alcohol abuse Father    Asthma Other    Hyperlipidemia Other    Hypertension Other    Vision loss Other     Prior to Admission medications   Medication Sig Start Date End Date Taking? Authorizing Provider  acetaminophen (TYLENOL) 160 MG/5ML elixir Take 20.3 mLs (650 mg total) by mouth every 4 (four) hours as needed for pain. Patient taking differently: Take 650 mg by mouth every 6 (six) hours as needed for pain. 12/02/22   Buell Carmin, MD  carbamazepine (TEGRETOL) 100 MG chewable tablet TAKE 1 & 1/2 TABLETS BY MOUTH TWICE A DAY 11/27/23   Lowell Rude, MD  levalbuterol (XOPENEX) 1.25 MG/3ML nebulizer solution Take 1.25 mg by nebulization every 4 (four) hours as needed. 03/03/22   [provider]  metoprolol tartrate (LOPRESSOR) 50 MG tablet Place 1 tablet (50 mg total) into feeding tube 2 (two) times daily. 02/13/24   Luna Salinas, MD  Nutritional Supplements (PEDIASURE GROW & GAIN) LIQD Drink 1 bottle spaced out 4 times a day 05/29/23   Lowell Rude, MD  nystatin (MYCOSTATIN/NYSTOP) powder Apply 1 Application topically 3 (three) times daily as needed. 08/02/22   [provider]  polyethylene glycol powder (GOODSENSE CLEARLAX) 17 GM/SCOOP powder TAKE 17 GM BY MOUTH TWICE DAILY AS DIRECTED WITH PRUNE JUICE  10/31/23   Vanga, Rohini Reddy, MD  prednisoLONE (ORAPRED) 15 MG/5ML solution PLACE 3 ML INTO FEEDING TUBE DAILY 12/07/23   Lowell Rude, MD  ZINC OXIDE, TOPICAL, EX Apply topically once daily as needed Rash around G-tube and perineal area    [provider]    Physical Exam: Vitals:   03/23/24 2300 03/23/24 2330 03/23/24 2348 03/24/24 0000  BP: 129/86 (!) 131/91    Pulse: (!) 157 (!) 159    Resp: (!) 48 (!) 53    Temp:   99.6 F (37.6 C) 99.7 F (37.6 C)  TempSrc:   Oral Oral  SpO2: 98% 97%    Weight:      Height:        Physical Exam Vitals  and nursing note reviewed.  Constitutional:      General: He is not in acute distress.    Comments: Nonverbal, awake tachypneic  HENT:     Head: Normocephalic and atraumatic.  Cardiovascular:     Rate and Rhythm: Regular rhythm. Tachycardia present.     Heart sounds: Normal heart sounds.  Pulmonary:     Effort: Tachypnea present.     Breath sounds: Normal breath sounds.  Abdominal:     General: There is distension.     Palpations: Abdomen is soft.     Tenderness: There is no abdominal tenderness.  Musculoskeletal:     Comments: Contracted extremities  Neurological:     Mental Status: Mental status is at baseline.   Labs on Admission: I have personally reviewed following labs and imaging studies  CBC: Recent Labs  Lab 03/23/24 2205  WBC 17.8*  NEUTROABS 15.7*  HGB 15.3  HCT 44.7  MCV 89.6  PLT 218   Basic Metabolic Panel: Recent Labs  Lab 03/23/24 2205  NA 136  K 3.5  CL 95*  CO2 29  GLUCOSE 117*  BUN 7  CREATININE <0.30*  CALCIUM 9.2   GFR: CrCl cannot be calculated (This lab value cannot be used to calculate CrCl because it is not a number: <0.30). Liver Function Tests: Recent Labs  Lab 03/23/24 2205  AST 45*  ALT 63*  ALKPHOS 81  BILITOT 0.6  PROT 7.2  ALBUMIN 4.4   No results for input(s): "LIPASE", "AMYLASE" in the last 168 hours. No results for input(s): "AMMONIA" in the last  168 hours. Coagulation Profile: Recent Labs  Lab 03/23/24 2205  INR 1.0   Cardiac Enzymes: No results for input(s): "CKTOTAL", "CKMB", "CKMBINDEX", "TROPONINI" in the last 168 hours. BNP (last 3 results) No results for input(s): "PROBNP" in the last 8760 hours. HbA1C: No results for input(s): "HGBA1C" in the last 72 hours. CBG: No results for input(s): "GLUCAP" in the last 168 hours. Lipid Profile: No results for input(s): "CHOL", "HDL", "LDLCALC", "TRIG", "CHOLHDL", "LDLDIRECT" in the last 72 hours. Thyroid Function Tests: No results for input(s): "TSH", "T4TOTAL", "FREET4", "T3FREE", "THYROIDAB" in the last 72 hours. Anemia Panel: No results for input(s): "VITAMINB12", "FOLATE", "FERRITIN", "TIBC", "IRON", "RETICCTPCT" in the last 72 hours. Urine analysis:    Component Value Date/Time   COLORURINE YELLOW (A) 03/23/2024 2206   APPEARANCEUR CLOUDY (A) 03/23/2024 2206   APPEARANCEUR Clear 06/08/2014 1957   LABSPEC 1.009 03/23/2024 2206   LABSPEC 1.019 06/08/2014 1957   PHURINE 7.0 03/23/2024 2206   GLUCOSEU 50 (A) 03/23/2024 2206   GLUCOSEU Negative 06/08/2014 1957   HGBUR NEGATIVE 03/23/2024 2206   BILIRUBINUR NEGATIVE 03/23/2024 2206   BILIRUBINUR Negative 06/08/2014 1957   KETONESUR NEGATIVE 03/23/2024 2206   PROTEINUR NEGATIVE 03/23/2024 2206   NITRITE NEGATIVE 03/23/2024 2206   LEUKOCYTESUR NEGATIVE 03/23/2024 2206   LEUKOCYTESUR Negative 06/08/2014 1957    Radiological Exams on Admission: DG Chest Port 1 View Result Date: 03/23/2024 CLINICAL DATA:  Questionable sepsis - evaluate for abnormality. Rash. EXAM: PORTABLE CHEST 1 VIEW COMPARISON:  02/10/2024 FINDINGS: Heart and mediastinal contours are within normal limits. No focal opacities or effusions. No acute bony abnormality. Low lung volumes. Posterior spinal rods noted. IMPRESSION: No active cardiopulmonary disease.  Low volumes. Electronically Signed   By: Janeece Mechanic M.D.   On: 03/23/2024 22:29     Data  Reviewed: Relevant notes from primary care and specialist visits, past discharge summaries as available in EHR, including Care  Everywhere. Prior diagnostic testing as pertinent to current admission diagnoses Updated medications and problem lists for reconciliation ED course, including vitals, labs, imaging, treatment and response to treatment Triage notes, nursing and pharmacy notes and ED provider's notes Notable results as noted in HPI   Assessment and Plan: * Sepsis, unspecified organism (HCC) Suspect sepsis but no obvious source Will continue broad-spectrum antibiotics with cefepime vancomycin and Flagyl Continue sepsis fluids Follow-up procalcitonin    Sinus tachycardia Heart rate up to the 160s Patient has baseline sinus tachycardia and is on metoprolol  Continue metoprolol Will get D-dimer and consider chest CTA if elevated  Will give an initial 1 mg/kg dose of Lovenox while further workup in progress for possibility of PE  Restrictive and obstructive lung disease on bilevel positive airway pressure (BiPAP) History of respiratory arrest 02/11/2024 requiring CPR Duchenne's muscular dystrophy Patient with tachypnea, O2 sat high 90s on room air Continue BiPAP nightly Close monitoring in view of history of respiratory arrest--patient was referred to ENT for consideration of elective tracheostomy   Epilepsy (HCC) Continue carbamazepine   Duchenne muscular dystrophy (HCC) Autism with intellectual disability Functional quadriplegia Requires very substantial support  G-tube status Will hold G-tube feeds tonight    DVT prophylaxis: Lovenox  Consults: none  Advance Care Planning:   Code Status: Prior   Family Communication: mother  Disposition Plan: Back to previous home environment  Severity of Illness: The appropriate patient status for this patient is INPATIENT. Inpatient status is judged to be reasonable and necessary in order to provide the required intensity  of service to ensure the patient's safety. The patient's presenting symptoms, physical exam findings, and initial radiographic and laboratory data in the context of their chronic comorbidities is felt to place them at high risk for further clinical deterioration. Furthermore, it is not anticipated that the patient will be medically stable for discharge from the hospital within 2 midnights of admission.   * I certify that at the point of admission it is my clinical judgment that the patient will require inpatient hospital care spanning beyond 2 midnights from the point of admission due to high intensity of service, high risk for further deterioration and high frequency of surveillance required.*  Author: Lanetta Pion, MD 03/24/2024 12:54 AM  For on call review [email or web address].

## 2024-03-24 NOTE — ED Notes (Signed)
 Started cefepime, pt and mother are sleeping. Pt tolerating bipap.

## 2024-03-24 NOTE — ED Notes (Signed)
 Received report from night RN. Pt is sleeping. Per report, pt has been tachycardic all night and was receiving metoprolol only once daily instead of BID as prescribed because mother of pt believed it was causing a rash. Pt sleeps with cpap and currently is on bipap. Messaged provider to update.

## 2024-03-24 NOTE — ED Notes (Signed)
 Pt alert, smiling. Mother requesting abx ointment.

## 2024-03-24 NOTE — ED Notes (Signed)
 Mother and pt are sleeping.

## 2024-03-24 NOTE — Progress Notes (Signed)
 Patient was set up on bipap earlier for sleep. Patient with notable pressure area at bridge of nose. Gel cushion used over area when applying bipap therapy.

## 2024-03-24 NOTE — Assessment & Plan Note (Addendum)
 Suspect sepsis but no obvious source Will continue broad-spectrum antibiotics with cefepime vancomycin and Flagyl Continue sepsis fluids Follow-up procalcitonin

## 2024-03-24 NOTE — Progress Notes (Signed)
 Pharmacy Antibiotic Note  Lucas [name] is a 23 y.o. male admitted on 03/23/2024 with sepsis.  Pharmacy has been consulted for Vancomycin dosing.  Pt has muscular dystrophy with very low SrCr and therefore cannot accurately determine renal function.  Will dose by levels.   Plan: Vancomycin 1 gm IV X 1 given in ED on 4/12 @ 2347. - Will vanc level ~ 12 hrs from initial dose on 4/13 @ 1200.  Goal trough:  15 - 20 mcg/mL   Height: 5' (152.4 cm) Weight: 48.5 kg (106 lb 14.8 oz) IBW/kg (Calculated) : 50  Temp (24hrs), Avg:100.1 F (37.8 C), Min:99.6 F (37.6 C), Max:101 F (38.3 C)  Recent Labs  Lab 03/23/24 2205  WBC 17.8*  CREATININE <0.30*  LATICACIDVEN 1.3    CrCl cannot be calculated (This lab value cannot be used to calculate CrCl because it is not a number: <0.30).    Allergies  Allergen Reactions   Lisinopril Rash    Red bubbly rash   Penicillins Hives and Rash    Has patient had a PCN reaction causing immediate rash, facial/tongue/throat swelling, SOB or lightheadedness with hypotension: Yes Has patient had a PCN reaction causing severe rash involving mucus membranes or skin necrosis: Yes Has patient had a PCN reaction that required hospitalization: No Has patient had a PCN reaction occurring within the last 10 years: No If all of the above answers are "NO", then may proceed with Cephalosporin use.    Docusate Sodium     Rash and cant sleep   Metamucil [Psyllium] Other (See Comments)    "Interacted with seizure medicine"   Multivitamins     Red in face, itchy, bumps   Other Other (See Comments)    Senkot -- gets rash and cant sleep   Aloe Vera Rash    Mom report "red little dots" after applying onto face   Calcitonin Rash   Esomeprazole Magnesium Nausea And Vomiting   Omeprazole Other (See Comments)    Constipation and possible rash after drug was stopped   Simethicone Palpitations and Rash   Sunflower Oil Itching, Rash and Other (See Comments)    GI upset  - Avoid products containing this additive   Vitamin D Analogs Other (See Comments)    Rash on face if it contains sunflower seed oil Excessive urine output and thirsty, increase in gas    Antimicrobials this admission:   >>    >>   Dose adjustments this admission:   Microbiology results:  BCx:   UCx:    Sputum:    MRSA PCR:   Thank you for allowing pharmacy to be a part of this patient's care.  Briena Swingler D 03/24/2024 1:20 AM

## 2024-03-24 NOTE — ED Notes (Signed)
Blanket provided to pt mother

## 2024-03-24 NOTE — ED Notes (Signed)
 RT at bedside. Bipap removed. Pt is awake. Pt saturating above 95% on RA, RR still in 30s and HR in 130s. Also pt ha skin breakdown to bridge of nose from using cpap every night. Messaged Dr Hubert Madden.

## 2024-03-24 NOTE — Progress Notes (Signed)
 [facility] Progress Note  Patient: Lucas [name]    DGU:[id number]  DOA: 03/23/2024     Date of Service: the patient was seen and examined on 03/24/2024  Chief Complaint  Patient presents with   Rash   Brief hospital course: Lucas [name] is a 23 y.o. 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  Mother states patient has been on metoprolol since his hospitalization in March and it usually takes care of his tachycardia. He has had no cough or shortness of breath, vomiting or diarrhea. ED course and data review: Tmax 101, tachycardic to the 160s, BP 155/103, intermittently tachypneic to 53, O2 sat in the high 90s on room air. Labs with WBC 17,000, lactic acid pending Respiratory viral panel negative Mild transaminitis of AST/ALT of 45/63 with otherwise normal CMP Urinalysis sterile EKG showing sinus tachycardia at 160 with occasional VPCs and probably right ventricular hypertrophy Chest x-ray with no acute cardiopulmonary disease   Patient started on sepsis fluids and broad-spectrum antibiotics for sepsis of unknown source   Hospitalist consulted for admission and further management as below   Assessment and Plan:  # SIRS, suspect sepsis but no obvious source continue broad-spectrum antibiotics with cefepime vancomycin and Flagyl S/p sepsis fluids Procalcitonin negative, lactic acid negative Follow RVP and blood cultures CXR: No active cardiopulmonary disease. Low volumes.     # Sinus  tachycardia Patient has baseline sinus tachycardia and is on metoprolol  S/P metoprolol 50 mg one-time dose, started IV Lopressor as needed due to low blood pressure Started midodrine 10 mg 3 times daily due to low blood pressure Will get D-dimer and consider chest CTA if elevated  S/P 1 mg/kg dose of Lovenox while further workup in progress for possibility of PE Continue Lovenox 40 mg subcu daily Started Atarax 25 mg p.o. 3 times daily as needed for anxiety, might be causing tachycardia  Restrictive and obstructive lung disease on bilevel positive airway pressure (BiPAP) History of respiratory arrest 02/11/2024 requiring CPR Duchenne's muscular dystrophy Patient with tachypnea, O2 sat high 90s on room air Continue BiPAP nightly Close monitoring in view of history of respiratory arrest--patient was referred to ENT for consideration of elective tracheostomy     Hypokalemia, potassium repleted  Epilepsy (HCC) Continue carbamazepine   Duchenne muscular dystrophy (HCC) Autism with intellectual disability Functional quadriplegia Requires very substantial support   G-tube status Continue G-tube feeding Follow x-ray KUB to rule out constipation     Body mass index is 20.88 kg/m.  Interventions:  Diet: PEG tube feeding DVT Prophylaxis: Subcutaneous Lovenox   Advance goals of care discussion: Full code  Family Communication: family was present at bedside, at the time of interview.  Patient is nonverbal at baseline due to autism   Disposition:  Pt is from Home, admitted with SIRS, SOB, still has SOB, and on IV Abxwhich precludes a safe discharge. Discharge to Home, when stable, most likely in 2-3 days.  Subjective: Patient was admitted overnight due to respiratory distress, suspected SIRS, started on IV antibiotics.  Still patient is tachycardic, nonverbal due to autism.  Management plan discussed  with patient's mother at bedside.  Physical Exam: General: Autistic, nonverbal  at baseline and mild respiratory distress on BiPAP Eyes: PERRLA ENT: On BiPAP   Neck: no JVD,  Cardiovascular: S1 and S2 Present, no Murmur, tachycardic Respiratory: Equal air entry bilaterally, mild crackles, no wheezes  Abdomen: Bowel Sound present, Soft and no tenderness,  Skin: no rashes Extremities: no Pedal edema Neurologic: Autistic, nonverbal, functional quadriplegic due to Duchenne muscular dystrophy.  Vitals:   03/24/24 1125 03/24/24 1130 03/24/24 1200 03/24/24 1215  BP:  109/67 102/61   Pulse: (!) 134 (!) 134 (!) 120 100  Resp: (!) 33 (!) 35 (!) 28 20  Temp:      TempSrc:      SpO2: 96% 95% 93% 91%  Weight:      Height:        Intake/Output Summary (Last 24 hours) at 03/24/2024 1230 Last data filed at 03/24/2024 0818 Gross per 24 hour  Intake 2349 ml  Output --  Net 2349 ml   Filed Weights   03/23/24 2153  Weight: 48.5 kg    Data Reviewed: I have personally reviewed and interpreted daily labs, tele strips, imagings as discussed above. I reviewed all nursing notes, pharmacy notes, vitals, pertinent old records I have discussed plan of care as described above with RN and patient/family.  CBC: Recent Labs  Lab 03/23/24 2205 03/24/24 0425  WBC 17.8* 14.6*  NEUTROABS 15.7*  --   HGB 15.3 13.9  HCT 44.7 41.2  MCV 89.6 92.2  PLT 218 176   Basic Metabolic Panel: Recent Labs  Lab 03/23/24 2205 03/24/24 0425  NA 136 136  K 3.5 3.2*  CL 95* 97*  CO2 29 30  GLUCOSE 117* 110*  BUN 7 7  CREATININE <0.30* <0.30*  CALCIUM 9.2 8.9  MG  --  1.8  PHOS  --  2.7    Studies: DG Chest Port 1 View Result Date: 03/23/2024 CLINICAL DATA:  Questionable sepsis - evaluate for abnormality. Rash. EXAM: PORTABLE CHEST 1 VIEW COMPARISON:  02/10/2024 FINDINGS: Heart and mediastinal contours are within normal limits. No focal opacities or effusions. No acute bony abnormality. Low lung volumes. Posterior spinal rods noted. IMPRESSION: No active cardiopulmonary disease.   Low volumes. Electronically Signed   By: Janeece Mechanic M.D.   On: 03/23/2024 22:29    Scheduled Meds:  carbamazepine  300 mg Oral BID   enoxaparin (LOVENOX) injection  40 mg Subcutaneous Q24H   midodrine  10 mg Per Tube TID WC   polyethylene glycol  17 g Oral BID   vancomycin variable dose per unstable renal function (pharmacist dosing)   Does not apply See admin instructions   Continuous Infusions:  ceFEPime (MAXIPIME) IV Stopped (03/24/24 0818)   lactated ringers 150 mL/hr (03/24/24 0818)   metronidazole 500 mg (03/24/24 1059)   PRN Meds: acetaminophen **OR** acetaminophen, bisacodyl, bisacodyl, hydrOXYzine, levalbuterol, metoprolol tartrate, ondansetron **OR** ondansetron (ZOFRAN) IV  Time spent: 55 minutes  Author: Althia Atlas. MD [facility] 03/24/2024 12:30 PM  To reach On-call, see care teams to locate the attending and reach out to them via [email or web address]. If 7PM-7AM, please contact night-coverage If you still have difficulty reaching the attending provider, please page the [facility] (Director on Call) for [facility] on amion for assistance.

## 2024-03-24 NOTE — ED Notes (Signed)
 Pt provided sips of water. Pt swallowing fine.

## 2024-03-24 NOTE — ED Notes (Signed)
 Provided sandwich tray and drink for mother of pt.

## 2024-03-24 NOTE — ED Notes (Signed)
 Pt was falling asleep again (didn't sleep last night until 0430 today). RT called to place pt back on bipap per request of mother.

## 2024-03-24 NOTE — ED Notes (Signed)
 Mother of pt woke up, introduced self to mother, pt continues to sleep on bipap. Provided recliner for mother.

## 2024-03-24 NOTE — Assessment & Plan Note (Signed)
 History of respiratory arrest 02/11/2024 requiring CPR Duchenne's muscular dystrophy Patient with tachypnea, O2 sat high 90s on room air Continue BiPAP nightly Close monitoring in view of history of respiratory arrest--patient was referred to ENT for consideration of elective tracheostomy

## 2024-03-24 NOTE — Progress Notes (Signed)
 Pharmacy Antibiotic Note  Lucas [name] is a 23 y.o. male admitted on 03/23/2024 with sepsis.  Pharmacy has been consulted for Vancomycin dosing.  Pt has muscular dystrophy and functional quadriplegia.    -Hx of Red Man reaction with vancomycin- extend infusion time -?sepsis source -patient also on Cefepime and Metronidazole  Plan: Vancomycin 1 gm IV X 1 given in ED on 4/12 @ 2347. - vancomycin random level  4/13@1230 = < 4 mcg/ml (~ 12 hrs from initial dose)  Will order Vancomycin 750 mg IV q12h Goal AUC 400-550. Expected AUC: 494 Cmin 12.8 SCr used: 0.8    (Actual Scr <0.30,  Crcl likey overestimated in pt w/ low muscle mass) TBW   Vd 0.72   Will continue to assess renal fxn, cultures, length of therapy, etc   Height: 5' (152.4 cm) Weight: 48.5 kg (106 lb 14.8 oz) IBW/kg (Calculated) : 50  Temp (24hrs), Avg:100.3 F (37.9 C), Min:99.6 F (37.6 C), Max:101 F (38.3 C)  Recent Labs  Lab 03/23/24 2205 03/24/24 0102 03/24/24 0425 03/24/24 1230  WBC 17.8*  --  14.6*  --   CREATININE <0.30*  --  <0.30*  --   LATICACIDVEN 1.3 1.8  --   --   VANCORANDOM  --   --   --  <4    CrCl cannot be calculated (This lab value cannot be used to calculate CrCl because it is not a number: <0.30).    Allergies  Allergen Reactions   Lisinopril Rash    Red bubbly rash   Penicillins Hives and Rash    Has patient had a PCN reaction causing immediate rash, facial/tongue/throat swelling, SOB or lightheadedness with hypotension: Yes Has patient had a PCN reaction causing severe rash involving mucus membranes or skin necrosis: Yes Has patient had a PCN reaction that required hospitalization: No Has patient had a PCN reaction occurring within the last 10 years: No If all of the above answers are "NO", then may proceed with Cephalosporin use.    Apple Juice    Docusate Sodium     Rash and cant sleep   Metamucil [Psyllium] Other (See Comments)    "Interacted with seizure medicine"    Multivitamins     Red in face, itchy, bumps   Other Other (See Comments)    Senkot -- gets rash and cant sleep   Aloe Vera Rash    Mom report "red little dots" after applying onto face   Calcitonin Rash   Esomeprazole Magnesium Nausea And Vomiting   Omeprazole Other (See Comments)    Constipation and possible rash after drug was stopped   Simethicone Palpitations and Rash   Sunflower Oil Itching, Rash and Other (See Comments)    GI upset - Avoid products containing this additive   Vitamin D Analogs Other (See Comments)    Rash on face if it contains sunflower seed oil Excessive urine output and thirsty, increase in gas    Antimicrobials this admission:  Cefepime 4/12 (evening) >>    Metronidazole 4/12 (evening)>>  Vancomycin 4/12 (evening) >>  Dose adjustments this admission:   Microbiology results:  4/12 BCx: NGTD -UA neg, CXR neg,   UCx:    Sputum:    MRSA PCR:   Thank you for allowing pharmacy to be a part of this patient's care.  Thomasine Flick PharmD Clinical Pharmacist 03/24/2024

## 2024-03-24 NOTE — ED Provider Notes (Signed)
 Care of this patient assumed from prior physician at 2300 pending labs, completion of workup, anticipated admission. Please see prior physician note for further details.  Briefly this is a 23 year old male with history of muscular dystrophy presenting to the emergency department for evaluation of elevated heart rate and rash.  Febrile, tachycardic on presentation.  Sepsis orders initiated.  Rash not felt related to allergic reaction or cellulitis.  Chest x-Amanpreet Delmont without infiltrates.  Labs with leukocytosis WC 17.8, labs otherwise overall reassuring.  Urine without evidence of infection.  Normal lactate.  Patient already received broad-spectrum antibiotics with cefepime, vancomycin, Flagyl from prior provider as well as a liter of fluid.  Will give a dose of Tylenol given fever on presentation.  Mom also requesting just metoprolol.  Ongoing concern for sepsis though source is not completely clear.  Do thing patient is referred for admission.  Case discussed with Dr. Vallarie Gauze who will evaluate the patient for anticipated admission.   Claria Crofts, MD 03/24/24 [phone]

## 2024-03-24 NOTE — ED Notes (Signed)
 At pt bedside from arrival until now.

## 2024-03-24 NOTE — Assessment & Plan Note (Signed)
 Heart rate up to the 160s Patient has baseline sinus tachycardia and is on metoprolol  Continue metoprolol Will get D-dimer and consider chest CTA if elevated  Will give an initial 1 mg/kg dose of Lovenox while further workup in progress for possibility of PE

## 2024-03-24 NOTE — ED Notes (Signed)
 VBG sent & respiratory notified

## 2024-03-24 NOTE — ED Notes (Signed)
 Pt continues to sleep.

## 2024-03-25 DIAGNOSIS — A419 Sepsis, unspecified organism: Secondary | ICD-10-CM | POA: Diagnosis not present

## 2024-03-25 LAB — GLUCOSE, CAPILLARY
Glucose-Capillary: 127 mg/dL — ABNORMAL HIGH (ref 70–99)
Glucose-Capillary: 157 mg/dL — ABNORMAL HIGH (ref 70–99)
Glucose-Capillary: 163 mg/dL — ABNORMAL HIGH (ref 70–99)

## 2024-03-25 LAB — CBC
HCT: 37.7 % — ABNORMAL LOW (ref 39.0–52.0)
Hemoglobin: 12.4 g/dL — ABNORMAL LOW (ref 13.0–17.0)
MCH: 30.9 pg (ref 26.0–34.0)
MCHC: 32.9 g/dL (ref 30.0–36.0)
MCV: 94 fL (ref 80.0–100.0)
Platelets: 162 10*3/uL (ref 150–400)
RBC: 4.01 MIL/uL — ABNORMAL LOW (ref 4.22–5.81)
RDW: 13.7 % (ref 11.5–15.5)
WBC: 7.5 10*3/uL (ref 4.0–10.5)
nRBC: 0 % (ref 0.0–0.2)

## 2024-03-25 LAB — BASIC METABOLIC PANEL WITH GFR
Anion gap: 14 (ref 5–15)
BUN: 8 mg/dL (ref 6–20)
CO2: 21 mmol/L — ABNORMAL LOW (ref 22–32)
Calcium: 8.5 mg/dL — ABNORMAL LOW (ref 8.9–10.3)
Chloride: 102 mmol/L (ref 98–111)
Creatinine, Ser: <0.3 mg/dL — ABNORMAL LOW (ref 0.61–1.24)
Glucose, Bld: 42 mg/dL — CL (ref 70–99)
Potassium: 3 mmol/L — ABNORMAL LOW (ref 3.5–5.1)
Sodium: 137 mmol/L (ref 135–145)

## 2024-03-25 LAB — PHOSPHORUS: Phosphorus: 2.8 mg/dL (ref 2.5–4.6)

## 2024-03-25 LAB — MAGNESIUM: Magnesium: 1.8 mg/dL (ref 1.7–2.4)

## 2024-03-25 LAB — CBG MONITORING, ED
Glucose-Capillary: 205 mg/dL — ABNORMAL HIGH (ref 70–99)
Glucose-Capillary: 37 mg/dL — CL (ref 70–99)

## 2024-03-25 MED ORDER — DEXTROSE 50 % IV SOLN: 1.0000 | INTRAVENOUS | Status: DC | PRN

## 2024-03-25 MED ORDER — CARBAMAZEPINE 100 MG PO CHEW
150.0000 mg | CHEWABLE_TABLET | Freq: Two times a day (BID) | ORAL | Status: DC
  Administered 2024-03-25 – 2024-03-26 (×2): 150 mg
  Filled 2024-03-25 (×3): qty 1.5

## 2024-03-25 MED ORDER — NON FORMULARY: 237.0000 mL | Freq: Every day | Status: DC

## 2024-03-25 MED ORDER — KCL IN DEXTROSE-NACL 20-5-0.9 MEQ/L-%-% IV SOLN
INTRAVENOUS | Status: AC
  Filled 2024-03-25 (×3): qty 1000

## 2024-03-25 MED ORDER — OSMOLITE 1.2 CAL PO LIQD: 1000.0000 mL | ORAL | Status: DC

## 2024-03-25 MED ORDER — POTASSIUM CHLORIDE 10 MEQ/100ML IV SOLN
10.0000 meq | INTRAVENOUS | Status: AC
  Administered 2024-03-25 (×3): 10 meq via INTRAVENOUS
  Filled 2024-03-25 (×3): qty 100

## 2024-03-25 MED ORDER — MIDODRINE HCL 5 MG PO TABS
5.0000 mg | ORAL_TABLET | Freq: Three times a day (TID) | ORAL | Status: DC
  Administered 2024-03-26: 5 mg
  Filled 2024-03-25 (×2): qty 1

## 2024-03-25 MED ORDER — FREE WATER
100.0000 mL | Freq: Every day | Status: DC
  Administered 2024-03-25 – 2024-03-26 (×8): 100 mL
  Filled 2024-03-25 (×3): qty 100

## 2024-03-25 MED ORDER — CARBAMAZEPINE 100 MG PO CHEW
150.0000 mg | CHEWABLE_TABLET | Freq: Two times a day (BID) | ORAL | Status: DC
  Filled 2024-03-25 (×4): qty 1.5

## 2024-03-25 MED ORDER — ACETAMINOPHEN 160 MG/5ML PO SOLN: 650.0000 mg | Freq: Four times a day (QID) | ORAL | Status: DC | PRN

## 2024-03-25 MED ORDER — SODIUM CHLORIDE 0.9 % IV SOLN
2.0000 g | INTRAVENOUS | Status: DC
  Administered 2024-03-25 – 2024-03-26 (×2): 2 g via INTRAVENOUS
  Filled 2024-03-25 (×2): qty 20

## 2024-03-25 MED ORDER — PEDIASURE GROW & GAIN PO LIQD: 237.0000 mL | Freq: Every day | ORAL | Status: DC

## 2024-03-25 MED ORDER — DEXTROSE 10 % IV SOLN
INTRAVENOUS | Status: DC
  Filled 2024-03-25: qty 1000

## 2024-03-25 MED ORDER — SODIUM CHLORIDE 0.9 % IV BOLUS
500.0000 mL | Freq: Once | INTRAVENOUS | Status: AC
  Administered 2024-03-25: 500 mL via INTRAVENOUS

## 2024-03-25 MED ORDER — DEXTROSE 50 % IV SOLN
1.0000 | Freq: Once | INTRAVENOUS | Status: AC
  Administered 2024-03-25: 50 mL via INTRAVENOUS
  Filled 2024-03-25: qty 50

## 2024-03-25 MED ORDER — AZITHROMYCIN 250 MG PO TABS
500.0000 mg | ORAL_TABLET | Freq: Every day | ORAL | Status: DC
  Administered 2024-03-25: 500 mg
  Filled 2024-03-25: qty 2

## 2024-03-25 MED ORDER — POTASSIUM CHLORIDE 20 MEQ PO PACK
40.0000 meq | PACK | Freq: Once | ORAL | Status: AC
  Administered 2024-03-25: 40 meq
  Filled 2024-03-25: qty 2

## 2024-03-25 MED ORDER — PEDIASURE GROW & GAIN PO LIQD
237.0000 mL | Freq: Every day | ORAL | Status: DC
  Administered 2024-03-25 – 2024-03-26 (×6): 237 mL
  Filled 2024-03-25 (×8): qty 237

## 2024-03-25 NOTE — Progress Notes (Signed)
 Initial Nutrition Assessment  DOCUMENTATION CODES:   Not applicable  INTERVENTION:   -D/c Osmolite 1.2 -TF via g-tube:    1 carton (237 ml) Pediasure Grow and Gain 5 times daily (237 ml 5 times daily- administered 117 ml/hr for 3 hours each feed)    50 ml free water flush before and after each feeding   Tube feeding regimen provides 1200 kcal (100% of needs), 35 grams of protein.  NUTRITION DIAGNOSIS:   Inadequate oral intake related to inability to eat, dysphagia as evidenced by NPO status.  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  Mother states patient has been on metoprolol since his hospitalization in March and it usually takes care of his tachycardia.  Pt admitted with SIRS, suspected sepsis, and sinus tachycardia.   Reviewed I/O's: +3.8 L x 24 hours and +5 L since admission  Pt familiar to this RD due to multiple prior admissions. Pt just transferred to floor after being held in ED. Mom at bedside. Pt takes Pediasure Grow and Gain without Fiber formula PTA and mom refuses substitutions, stating he will not tolerate an adult formula. Pt mom is not open to formulary substitutions and unable to provide formula from home. Pt is NPO and receives TF via g-tube for sole source nutrition. Confirms home TF regimen  of 1 carton Pediasure Grow and Gain 5 times daily and 50 ml free water flush before and after each feeding administration. Regimen provides 1200 kcals and 35 grams protein. Home DME agency is [employer]. Pt does not do bolus feeds by gravity at home; instead is infused via pump. Each bolus if infused at 117 ml/hr over 3 hours.   Wt has been stable over the past 4 months.   Medications reviewed and include lovenox, miralax, and dextrose 5% and 0.9% NaCl with KCCl 20 mEq/L infusion @ 75 ml/hr.   No results found for: "HGBA1C" PTA DM medications are .   Labs reviewed: K: 3.0, CBGS: 37-205 (inpatient orders for glycemic control are none).    NUTRITION - FOCUSED PHYSICAL EXAM:  Flowsheet Row Most Recent Value  Orbital Region No depletion  Upper Arm Region No depletion  Thoracic and Lumbar Region No depletion  Buccal Region No depletion  Temple Region No depletion  Clavicle Bone Region No depletion  Clavicle and Acromion Bone Region No depletion  Scapular Bone Region No depletion  Dorsal Hand No depletion  Patellar Region No depletion  Anterior Thigh Region No depletion  Posterior Calf Region No depletion  Edema (RD Assessment) None  Hair Reviewed  Eyes Reviewed  Mouth Reviewed  Skin Reviewed  Nails Reviewed      Diet Order:   Diet Order             Diet NPO time specified  Diet effective now                   EDUCATION  NEEDS:   No education needs have been identified at this time  Skin:  Skin Assessment: Reviewed RN Assessment  Last BM:  03/24/24 (type 6)  Height:   Ht Readings from Last 1 Encounters:  03/23/24 5' (1.524 m)    Weight:   Wt Readings from Last 1 Encounters:  03/23/24 48.5 kg    Ideal Body Weight:  48.2 kg  BMI:  Body mass index is 20.88 kg/m.  Estimated Nutritional Needs:   Kcal:  1250-1450  Protein:  70-85 grams  Fluid:  1.2-2.4 L    Herschel Lords, RD, LDN, CDCES Registered Dietitian III Certified Diabetes Care and Education  Specialist If unable to reach this RD, please use "RD Inpatient" group chat on secure chat between hours of 8am-4 pm daily

## 2024-03-25 NOTE — ED Notes (Signed)
 Tube feed began at this time.

## 2024-03-25 NOTE — ED Notes (Signed)
 Pt bipap mask was readjusted and pt was also repositioned to left sided tilt.

## 2024-03-25 NOTE — ED Notes (Signed)
 This RN received report from Woodroe Hazel RN and performed care handoff. This RN introduced self to pt. Call light in reach, bed wheels locked, side rail raised, pt updated on plan of care. Rounding completed.

## 2024-03-25 NOTE — Plan of Care (Addendum)
       CROSS COVER NOTE  NAME: Lucas [name] MRN: [id number] DOB : April 05, 2001    Concern as stated by nurse / staff   Paged for hypoglycemia of 42  Patient is G-tube dependent and is not a known diabetic     Pertinent findings on chart review:    Latest Ref Rng & Units 03/25/2024    5:28 AM 03/24/2024    4:25 AM 03/23/2024   10:05 PM  BMP  Glucose 70 - 99 mg/dL 42  829  562   BUN 6 - 20 mg/dL 8  7  7    Creatinine 0.61 - 1.24 mg/dL <1.30  <8.65  <7.84   Sodium 135 - 145 mmol/L 137  136  136   Potassium 3.5 - 5.1 mmol/L 3.0  3.2  3.5   Chloride 98 - 111 mmol/L 102  97  95   CO2 22 - 32 mmol/L 21  30  29    Calcium 8.9 - 10.3 mg/dL 8.5  8.9  9.2      Assessment and  Interventions   Assessment:  Hypoglycemia, not a diabetic-likely related to inadequate nourishment G-tube dependent  Plan: Continue hypoglycemia protocol including administration of D 50 CBG q1  Start D 10 Will start tube feeds and get dietary consult

## 2024-03-25 NOTE — Progress Notes (Signed)
 Brief Nutrition Note  Consult received for enteral/tube feeding initiation and management.  Adult Enteral Nutrition Protocol initiated. Full assessment to follow.  G-tube tube in place with tip located in stomach per xray imaging.   Pt familiar to this RD due to prior admission. Pt is NPO and receives TF via g-tube for sole source nutrition. Confirms home TF regimen of 1 carton Pediasure Grow and Gain 5 times daily and 50 ml free water flush before and after each feeding administration. Regimen provides 1200 kcals and 35 grams protein. Pt mom is not open to formulary substitutions and unable to provide formula from home.   -TF via g-tube:    1 carton (237 ml) Pediasure Grow and Gain 5 times daily (237 ml 5 times daily- administered 237 ml/hr for 1 hour each feed)    50 ml free water flush before and after each feeding   Tube feeding regimen provides 1200 kcal (100% of needs), 35 grams of protein.  Admitting Dx: Sepsis, unspecified organism (HCC) [A41.9] Acute hypoxic respiratory failure (HCC) [J96.01]  Labs:  Recent Labs  Lab 03/23/24 2205 03/24/24 0425 03/25/24 0528  NA 136 136 137  K 3.5 3.2* 3.0*  CL 95* 97* 102  CO2 29 30 21*  BUN 7 7 8   CREATININE <0.30* <0.30* <0.30*  CALCIUM 9.2 8.9 8.5*  MG  --  1.8 1.8  PHOS  --  2.7 2.8  GLUCOSE 117* 110* 42*    Ramiz Turpin W, RD, LDN, CDCES Registered Dietitian III Certified Diabetes Care and Education Specialist If unable to reach this RD, please use "RD Inpatient" group chat on secure chat between hours of 8am-4 pm daily

## 2024-03-25 NOTE — ED Notes (Signed)
 Secretary ordered a hospital bed for the patient but no there are no beds available.

## 2024-03-25 NOTE — ED Notes (Signed)
 Pediasure Tube Feeds started at rate of 117 ml/hr per mother's request, pump programmed to flush 50 ml after, mother states she gives tube feeds every 3 hours at home.

## 2024-03-25 NOTE — Progress Notes (Signed)
 [facility] Progress Note  Patient: Lucas [name]    ZOX:[id number]  DOA: 03/23/2024     Date of Service: the patient was seen and examined on 03/25/2024  Chief Complaint  Patient presents with   Rash   Brief hospital course: Lucas [name] is a 23 y.o. 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  Mother states patient has been on metoprolol since his hospitalization in March and it usually takes care of his tachycardia. He has had no cough or shortness of breath, vomiting or diarrhea. ED course and data review: Tmax 101, tachycardic to the 160s, BP 155/103, intermittently tachypneic to 53, O2 sat in the high 90s on room air. Labs with WBC 17,000, lactic acid pending Respiratory viral panel negative Mild transaminitis of AST/ALT of 45/63 with otherwise normal CMP Urinalysis sterile EKG showing sinus tachycardia at 160 with occasional VPCs and probably right ventricular hypertrophy Chest x-ray with no acute cardiopulmonary disease   Patient started on sepsis fluids and broad-spectrum antibiotics for sepsis of unknown source   Hospitalist consulted for admission and further management as below   Assessment and Plan:  # SIRS, suspect sepsis but no obvious source continue broad-spectrum antibiotics with cefepime vancomycin and Flagyl S/p sepsis fluids Procalcitonin negative, lactic acid negative RVP viral panel negative Blood cultures NGTD CXR: No active cardiopulmonary disease. Low volumes.      # Sinus tachycardia Patient has baseline sinus tachycardia and is on metoprolol  S/P metoprolol 50 mg one-time dose, started IV Lopressor as needed due to low blood pressure Started midodrine 10 mg 3 times daily due to low blood pressure D-dimer 0.27 negative, less likely PE, no need of CTA   Continue Lovenox 40 mg subcu daily for dvt ppx Started Atarax 25 mg p.o. 3 times daily as needed for anxiety, might be causing tachycardia TSH 0.9 within normal range   Restrictive and obstructive lung disease on bilevel positive airway pressure (BiPAP) History of respiratory arrest 02/11/2024 requiring CPR Duchenne's muscular dystrophy Patient with tachypnea, O2 sat high 90s on room air Continue BiPAP nightly Close monitoring in view of history of respiratory arrest--patient was referred to ENT for consideration of elective tracheostomy    Hypoglycemia secondary to decreased nutrition, as patient was not getting PEG tube feeding. Blood glucose improved after D50, started D5 NS Continue PEG tube feeding Monitor CBG    Hypokalemia, potassium repleted Monitor electrolytes and replete as needed  Epilepsy (HCC) Continue carbamazepine   Duchenne muscular dystrophy (HCC) Autism with intellectual disability Functional quadriplegia Requires very substantial support   G-tube status Continue G-tube feeding x-ray KUB negative for constipation Continue laxatives   Body mass index is 20.88 kg/m.  Interventions:  Diet: PEG tube feeding DVT Prophylaxis: Subcutaneous Lovenox   Advance goals of care discussion: Full code  Family Communication: family was present at bedside, at the time of interview.  Patient is nonverbal at baseline due to autism   Disposition:  Pt is from Home, admitted with SIRS, SOB, still has SOB, and on IV Abx, which precludes a safe discharge. Discharge to Home, when stable, most likely  in 2-3 days.  Subjective: No significant events overnight, patient was on  BiPAP in the morning, transition to nasal cannula and we will check O2 sat on room air.   Patient's mother was at bedside, stated he does not wear oxygen at baseline at home Management plan discussed.   Physical Exam: General: Autistic, nonverbal at baseline and mild respiratory distress on BiPAP Eyes: PERRLA ENT: On BiPAP   Neck: no JVD,  Cardiovascular: S1 and S2 Present, no Murmur, tachycardic Respiratory: Equal air entry bilaterally, mild crackles, no wheezes  Abdomen: Bowel Sound present, Soft and no tenderness,  Skin: no rashes Extremities: no Pedal edema Neurologic: Autistic, nonverbal, functional quadriplegic due to Duchenne muscular dystrophy.  Vitals:   03/25/24 1500 03/25/24 1510 03/25/24 1515 03/25/24 1536  BP: 133/82   (!) 141/105  Pulse: (!) 116 (!) 112 (!) 119 (!) 118  Resp: (!) 32 (!) 43 (!) 25 (!) 28  Temp:    98.8 F (37.1 C)  TempSrc:    Oral  SpO2: 95% (!) 83% 93% 98%  Weight:      Height:        Intake/Output Summary (Last 24 hours) at 03/25/2024 1625 Last data filed at 03/25/2024 1610 Gross per 24 hour  Intake 1700 ml  Output --  Net 1700 ml   Filed Weights   03/23/24 2153  Weight: 48.5 kg    Data Reviewed: I have personally reviewed and interpreted daily labs, tele strips, imagings as discussed above. I reviewed all nursing notes, pharmacy notes, vitals, pertinent old records I have discussed plan of care as described above with RN and patient/family.  CBC: Recent Labs  Lab 03/23/24 2205 03/24/24 0425 03/25/24 0528  WBC 17.8* 14.6* 7.5  NEUTROABS 15.7*  --   --   HGB 15.3 13.9 12.4*  HCT 44.7 41.2 37.7*  MCV 89.6 92.2 94.0  PLT 218 176 162   Basic Metabolic Panel: Recent Labs  Lab 03/23/24 2205 03/24/24 0425 03/25/24 0528  NA 136 136 137  K 3.5 3.2* 3.0*  CL 95* 97* 102  CO2 29 30 21*  GLUCOSE 117* 110* 42*  BUN 7 7 8   CREATININE <0.30* <0.30* <0.30*  CALCIUM 9.2 8.9 8.5*  MG  --  1.8 1.8  PHOS  --  2.7 2.8     Studies: No results found.   Scheduled Meds:  carbamazepine  150 mg Oral BID   enoxaparin (LOVENOX) injection  40 mg Subcutaneous Q24H   free water  100 mL Per Tube 5 X Daily   midodrine  5 mg Per Tube TID WC   PediaSure Grow & Gain  237 mL Tube 5 X Daily   polyethylene glycol  17 g Oral BID   Continuous Infusions:  ceFEPime (MAXIPIME) IV Stopped (03/25/24 0620)   dextrose 5 % and 0.9 % NaCl with KCl 20 mEq/L 75 mL/hr at 03/25/24 0944   metronidazole 500 mg (03/25/24 1437)   vancomycin Stopped (03/25/24 0527)   PRN Meds: acetaminophen (TYLENOL) oral liquid 160 mg/5 mL, [DISCONTINUED] acetaminophen **OR** acetaminophen, bisacodyl, bisacodyl, dextrose, hydrOXYzine, levalbuterol, metoprolol tartrate, ondansetron **OR** ondansetron (ZOFRAN) IV  Time spent: 55 minutes  Author: Gillis Santa. MD [facility] 03/25/2024 4:25 PM  To reach On-call, see care teams to locate the attending and reach out to them via [email or web address]. If 7PM-7AM, please contact night-coverage If you still have difficulty reaching the attending provider, please page the [facility] (Director on Call) for [facility] on amion for assistance.

## 2024-03-25 NOTE — ED Notes (Signed)
 Dr. Vallarie Gauze informed of Glucose of 42. 1 Amp of D50 was administered. Pt is still easily arousal to voice.

## 2024-03-25 NOTE — ED Notes (Signed)
 Pt had a large BM at this time. Nursing team performed hygiene and changed pt's sheets and gown as well as depends.

## 2024-03-25 NOTE — ED Notes (Signed)
 Nursing team completed full bed change for pt at this time and hygiene care. Warm blankets given.

## 2024-03-26 DIAGNOSIS — A419 Sepsis, unspecified organism: Secondary | ICD-10-CM | POA: Diagnosis not present

## 2024-03-26 LAB — BASIC METABOLIC PANEL WITH GFR
Anion gap: 6 (ref 5–15)
BUN: <5 mg/dL — ABNORMAL LOW (ref 6–20)
CO2: 28 mmol/L (ref 22–32)
Calcium: 8.4 mg/dL — ABNORMAL LOW (ref 8.9–10.3)
Chloride: 104 mmol/L (ref 98–111)
Creatinine, Ser: <0.3 mg/dL — ABNORMAL LOW (ref 0.61–1.24)
Glucose, Bld: 98 mg/dL (ref 70–99)
Potassium: 3.6 mmol/L (ref 3.5–5.1)
Sodium: 138 mmol/L (ref 135–145)

## 2024-03-26 LAB — GLUCOSE, CAPILLARY
Glucose-Capillary: 143 mg/dL — ABNORMAL HIGH (ref 70–99)
Glucose-Capillary: 166 mg/dL — ABNORMAL HIGH (ref 70–99)
Glucose-Capillary: 213 mg/dL — ABNORMAL HIGH (ref 70–99)

## 2024-03-26 LAB — CBC
HCT: 37 % — ABNORMAL LOW (ref 39.0–52.0)
Hemoglobin: 12.6 g/dL — ABNORMAL LOW (ref 13.0–17.0)
MCH: 31 pg (ref 26.0–34.0)
MCHC: 34.1 g/dL (ref 30.0–36.0)
MCV: 90.9 fL (ref 80.0–100.0)
Platelets: 182 10*3/uL (ref 150–400)
RBC: 4.07 MIL/uL — ABNORMAL LOW (ref 4.22–5.81)
RDW: 13.2 % (ref 11.5–15.5)
WBC: 6.9 10*3/uL (ref 4.0–10.5)
nRBC: 0 % (ref 0.0–0.2)

## 2024-03-26 LAB — PHOSPHORUS: Phosphorus: 1.9 mg/dL — ABNORMAL LOW (ref 2.5–4.6)

## 2024-03-26 LAB — MAGNESIUM: Magnesium: 1.7 mg/dL (ref 1.7–2.4)

## 2024-03-26 MED ORDER — POTASSIUM PHOSPHATES 15 MMOLE/5ML IV SOLN
30.0000 mmol | Freq: Once | INTRAVENOUS | Status: AC
  Administered 2024-03-26: 30 mmol via INTRAVENOUS
  Filled 2024-03-26: qty 10

## 2024-03-26 MED ORDER — HYDROXYZINE HCL 10 MG/5ML PO SYRP: 25.0000 mg | ORAL_SOLUTION | Freq: Three times a day (TID) | ORAL | 0 refills | Status: AC | PRN

## 2024-03-26 NOTE — TOC Transition Note (Signed)
 Transition of Care [facility]) - Discharge Note   Patient Details  Name: Lucas [name] MRN: [id number] Date of Birth: [birth year]  [facility] CM/SW Contact:  Odilia Bennett, LCSW Phone Number: 03/26/2024, 3:39 PM   Clinical Narrative:   Patient has orders to discharge home today. Patient has personal care services through [facility]. CSW faxed discharge summary to agency. Patient has an infusion running until late this afternoon. [facility] arranged for 6:15. Mom is aware and confirmed address on facesheet is correct. CSW asked RN to call mom once transport arrives. No further concerns. CSW signing off.  Final next level of care: Home w Home Health Services Barriers to Discharge: Barriers Resolved   Patient Goals and CMS Choice            Discharge Placement                Patient to be transferred to facility by: [facility] Name of family member notified: Richie Bonanno Patient and family notified of of transfer: 03/26/24  Discharge Plan and Services Additional resources added to the After Visit Summary for Autism/IDD                                     Social Drivers of Health (SDOH) Interventions SDOH Screenings   Food Insecurity: No Food Insecurity (03/25/2024)  Housing: Low Risk  (03/25/2024)  Transportation Needs: No Transportation Needs (03/25/2024)  Utilities: Not At Risk (03/25/2024)  Social Connections: Unknown (03/25/2024)  Tobacco Use: Medium Risk (03/23/2024)  Health Literacy: Low Risk  (03/21/2021)   Received from [facility], [facility]     Readmission Risk Interventions     No data to display

## 2024-03-26 NOTE — Plan of Care (Signed)
 1922 Received report, family present. Tele box noted. Follows with eyes, does not speak. Color pale, skin warm and dry. Sitting high fowlers. Assessment completed, peg tub in place with dry dressing. Tube feed infusing pediasure as ordered 117/hr on hold at this time. D5NS with 20meq KCL infusing to R FA at 75cc/hr via pump, good blood return noted, flushes well. Limited movement noted to upper extremities. L hand fingers contracted. Dry dressing  taped in place over abrasion on top of nose, parent reports abrasion is from the CPAP mask.  2020 #3 tube feed initiated via feeding pump at 117/hr. Parent reports will stop feeding at bedtime to sleep. 2036 Resp called, parent asking to start CPAP, on the way. Repositioned.  0208 Peg tube flushed with 50ml water. Peg tube feed completed. Incontinent of urine, pericare and linen changed.  0400 awake when checked. Repositioned.  0600 awake when checked. Continued peg tub feed as ordered. Repositioned. Slept on and off all night. 4-5 hrs. Peg tube flushed and feed started as ordered. Incontinent of urine and linen and gown changed.

## 2024-03-26 NOTE — Discharge Summary (Signed)
 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)   Duchenne muscular dystrophy (HCC)   Feeding by G-tube (HCC)   Autism spectrum disorder with accompanying language impairment and intellectual disability, requiring very substantial support   Contractures involving both knees   Acute hypoxic respiratory failure (HCC)   Admitted From: Home Disposition:  Home with HH  Recommendations for Outpatient Follow-up:  PCP: 1 wk Follow up LABS/TEST:  None   Diet recommendation: PEG tube feeding  Activity: The patient is advised to gradually reintroduce usual activities, as tolerated  Discharge Condition: stable  Code Status: Full code   History of present illness: As per the H and P dictated on admission Hospital Course:  Lucas [name] is a 23 y.o. 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  Mother states patient has been on metoprolol since his hospitalization in March and it usually takes care of his tachycardia. He has had no cough or shortness of breath, vomiting  or diarrhea. ED course and data review: Tmax 101, tachycardic to the 160s, BP 155/103, intermittently tachypneic to 53, O2 sat in the high 90s on room air. Labs with WBC 17,000, lactic acid pending Respiratory viral panel negative Mild transaminitis of AST/ALT of 45/63 with otherwise normal CMP Urinalysis sterile EKG showing sinus tachycardia at 160 with occasional VPCs and probably right ventricular hypertrophy Chest x-ray with no acute cardiopulmonary disease   Patient started on sepsis fluids and broad-spectrum antibiotics for sepsis of unknown source   Hospitalist consulted for admission and further management as below     Assessment and Plan:   # SIRS, suspect sepsis but no obvious source S/p broad-spectrum antibiotics, cefepime vancomycin and Flagyl S/p sepsis fluids. Procalcitonin negative, lactic acid negative RVP viral panel negative. Blood cultures NGTD CXR: No active cardiopulmonary disease. Low volumes.  Antibiotics de-escalated to ceftriaxone and azithromycin for 1 day, patient does not need more antibiotics, continue to monitor off antibiotics at home. Follow-up with PCP in 1 week.   # Sinus tachycardia due to autism Patient has baseline sinus tachycardia and is on metoprolol  S/P metoprolol 50 mg one-time dose, s/p IV Lopressor prn given during hospital stay. S/p midodrine 10 mg 3 times daily due to low blood pressure D-dimer 0.27 negative, less likely PE, no need of CTA   S/p Lovenox 40 mg subcu daily for dvt ppx Started Atarax 25 mg p.o. 3 times daily as needed for anxiety, might be causing tachycardia. 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  Continue supplemental O2 admission as needed.  Continue BiPAP nightly and as needed. Close monitoring in view  of history of respiratory arrest one month ago--patient was referred to ENT for consideration of elective tracheostomy     # Hypoglycemia secondary to decreased nutrition, as patient was not getting PEG tube feeding. s/p D50, blood glucose improved.  Patient received D5 and NS which was stopped when blood glucose improved.  PEG tube feeding was resumed.  Patient is tolerating well.  # Hypokalemia, potassium repleted and resolved. # Hypophosphatemia, Phos repleted IV before discharge. 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 Continue laxatives    Body mass index is 21.36 kg/m.  Nutrition Problem: Inadequate oral intake Etiology: inability to eat, dysphagia Nutrition Interventions: Interventions: Tube feeding  Clinically patient is stable, medically optimized.  Management plan discussed with patient's mother and she agreed with the discharge planning.   Consultants: None Procedures: None  Discharge Exam: General: Autistic, nonverbal at baseline, no resp distress Eyes: PERRLA ENT: Oral clear and moist Neck: no JVD,  Cardiovascular: S1 and S2 Present, no Murmur, tachycardic Respiratory: Equal air entry bilaterally, No crackles and no wheezes  Abdomen: Bowel Sound present, Soft and no tenderness,  Skin: no rashes Extremities: no Pedal edema Neurologic: Autistic, nonverbal, functional quadriplegic due to Duchenne muscular dystrophy.   Filed Weights   03/23/24 2153 03/26/24 0705  Weight: 48.5 kg 49.6 kg   Vitals:   03/26/24 0937 03/26/24 1147  BP: 118/71 124/79  Pulse: (!) 142   Resp: 20   Temp: 98.7 F (37.1 C)   SpO2: 97%     DISCHARGE MEDICATION: Allergies as of 03/26/2024       Reactions   Lisinopril Rash   Red bubbly rash   Penicillins Hives, Rash   Has  patient had a PCN reaction causing immediate rash, facial/tongue/throat swelling, SOB or lightheadedness with hypotension: Yes Has patient had a PCN reaction causing severe rash involving mucus membranes or skin necrosis: Yes Has patient had a PCN reaction that required hospitalization: No Has patient had a PCN reaction occurring within the last 10 years: No If all of the above answers are "NO", then may proceed with Cephalosporin use.   Apple Juice    Docusate Sodium    Rash and cant sleep   Metamucil [psyllium] Other (See Comments)   "Interacted with seizure medicine"   Multivitamins    Red in face, itchy, bumps   Other Other (See Comments)   Senkot -- gets rash and cant sleep   Aloe Vera Rash   Mom report "red little dots" after applying onto face   Calcitonin Rash   Esomeprazole Magnesium Nausea And Vomiting   Omeprazole Other (See Comments)   Constipation and possible rash after drug was stopped   Simethicone Palpitations, Rash   Sunflower Oil Itching, Rash, Other (See Comments)   GI upset - Avoid products containing this additive   Vitamin D Analogs Other (See Comments)   Rash on face if it contains sunflower seed oil Excessive urine output and thirsty, increase in gas        Medication List     STOP taking these medications    prednisoLONE 15 MG/5ML solution Commonly known as: ORAPRED       TAKE these medications    acetaminophen 160 MG/5ML elixir Commonly known as: TYLENOL  Take 20.3 mLs (650 mg total) by mouth every 4 (four) hours as needed for pain. What changed: when to take this   carbamazepine 100 MG chewable tablet Commonly known as: TEGRETOL TAKE 1 & 1/2 TABLETS BY MOUTH TWICE A DAY   GoodSense ClearLax 17 GM/SCOOP powder Generic drug: polyethylene glycol powder TAKE 17 GM BY MOUTH TWICE DAILY AS DIRECTED WITH PRUNE JUICE What changed: See the new instructions.   hydrOXYzine 10 MG/5ML syrup Commonly known as: ATARAX Place 12.5 mLs (25 mg total)  into feeding tube 3 (three) times daily as needed for anxiety.   levalbuterol 1.25 MG/3ML nebulizer solution Commonly known as: XOPENEX Take 1.25 mg by nebulization every 4 (four) hours as needed.   metoprolol tartrate 50 MG tablet Commonly known as: LOPRESSOR Place 1 tablet (50 mg total) into feeding tube 2 (two) times daily.   nystatin powder Commonly known as: MYCOSTATIN/NYSTOP Apply 1 Application topically 3 (three) times daily as needed.   PediaSure Grow & Gain Liqd Drink 1 bottle spaced out 4 times a day   ZINC OXIDE (TOPICAL) EX Apply topically once daily as needed Rash around G-tube and perineal area       Allergies  Allergen Reactions   Lisinopril Rash    Red bubbly rash   Penicillins Hives and Rash    Has patient had a PCN reaction causing immediate rash, facial/tongue/throat swelling, SOB or lightheadedness with hypotension: Yes Has patient had a PCN reaction causing severe rash involving mucus membranes or skin necrosis: Yes Has patient had a PCN reaction that required hospitalization: No Has patient had a PCN reaction occurring within the last 10 years: No If all of the above answers are "NO", then may proceed with Cephalosporin use.    Apple Juice    Docusate Sodium     Rash and cant sleep   Metamucil [Psyllium] Other (See Comments)    "Interacted with seizure medicine"   Multivitamins     Red in face, itchy, bumps   Other Other (See Comments)    Senkot -- gets rash and cant sleep   Aloe Vera Rash    Mom report "red little dots" after applying onto face   Calcitonin Rash   Esomeprazole Magnesium Nausea And Vomiting   Omeprazole Other (See Comments)    Constipation and possible rash after drug was stopped   Simethicone Palpitations and Rash   Sunflower Oil Itching, Rash and Other (See Comments)    GI upset - Avoid products containing this additive   Vitamin D Analogs Other (See Comments)    Rash on face if it contains sunflower seed oil Excessive  urine output and thirsty, increase in gas   Discharge Instructions     Call MD for:  difficulty breathing, headache or visual disturbances   Complete by: As directed    Call MD for:  redness, tenderness, or signs of infection (pain, swelling, redness, odor or green/yellow discharge around incision site)   Complete by: As directed    Call MD for:  severe uncontrolled pain   Complete by: As directed    Call MD for:  temperature >100.4   Complete by: As directed    Diet - low sodium heart healthy   Complete by: As directed    Discharge instructions   Complete by: As directed    F/u with PCP in 1 wk   Increase activity slowly   Complete by: As directed        The results of significant  diagnostics from this hospitalization (including imaging, microbiology, ancillary and laboratory) are listed below for reference.    Significant Diagnostic Studies: DG Abd 1 View Result Date: 03/24/2024 CLINICAL DATA:  Constipation. History of muscular dystrophy. G-tube dependent. EXAM: ABDOMEN - 1 VIEW COMPARISON:  Abdominopelvic CT 02/08/2024. Radiographs 06/16/2020 and 05/24/2020. FINDINGS: 1303 hours. There is a normal nonobstructive bowel gas pattern. Colonic stool burden does not appear increased. Percutaneous G-tube projects over the left upper quadrant of the abdomen. There are extensive postsurgical changes from previous spinal fusion. No acute osseous findings or suspicious abdominal calcifications are demonstrated. IMPRESSION: No evidence of bowel obstruction or other acute abdominal process. Percutaneous G-tube projects over the left upper quadrant of the abdomen. Electronically Signed   By: Carey Bullocks M.D.   On: 03/24/2024 13:25   DG Chest Port 1 View Result Date: 03/23/2024 CLINICAL DATA:  Questionable sepsis - evaluate for abnormality. Rash. EXAM: PORTABLE CHEST 1 VIEW COMPARISON:  02/10/2024 FINDINGS: Heart and mediastinal contours are within normal limits. No focal opacities or  effusions. No acute bony abnormality. Low lung volumes. Posterior spinal rods noted. IMPRESSION: No active cardiopulmonary disease.  Low volumes. Electronically Signed   By: Charlett Nose M.D.   On: 03/23/2024 22:29    Microbiology: Recent Results (from the past 240 hours)  Resp panel by RT-PCR (RSV, Flu A&B, Covid) Anterior Nasal Swab     Status: None   Collection Time: 03/23/24 10:05 PM   Specimen: Anterior Nasal Swab  Result Value Ref Range Status   SARS Coronavirus 2 by RT PCR NEGATIVE NEGATIVE Final    Comment: (NOTE) SARS-CoV-2 target nucleic acids are NOT DETECTED.  The SARS-CoV-2 RNA is generally detectable in upper respiratory specimens during the acute phase of infection. The lowest concentration of SARS-CoV-2 viral copies this assay can detect is 138 copies/mL. A negative result does not preclude SARS-Cov-2 infection and should not be used as the sole basis for treatment or other patient management decisions. A negative result may occur with  improper specimen collection/handling, submission of specimen other than nasopharyngeal swab, presence of viral mutation(s) within the areas targeted by this assay, and inadequate number of viral copies(<138 copies/mL). A negative result must be combined with clinical observations, patient history, and epidemiological information. The expected result is Negative.  Fact Sheet for Patients:  BloggerCourse.com  Fact Sheet for Healthcare Providers:  SeriousBroker.it  This test is no t yet approved or cleared by the [place] FDA and  has been authorized for detection and/or diagnosis of SARS-CoV-2 by FDA under an Emergency Use Authorization (EUA). This EUA will remain  in effect (meaning this test can be used) for the duration of the COVID-19 declaration under Section 564(b)(1) of the Act, 21 U.S.C.section 360bbb-3(b)(1), unless the authorization is terminated  or revoked sooner.        Influenza A by PCR NEGATIVE NEGATIVE Final   Influenza B by PCR NEGATIVE NEGATIVE Final    Comment: (NOTE) The Xpert Xpress SARS-CoV-2/FLU/RSV plus assay is intended as an aid in the diagnosis of influenza from Nasopharyngeal swab specimens and should not be used as a sole basis for treatment. Nasal washings and aspirates are unacceptable for Xpert Xpress SARS-CoV-2/FLU/RSV testing.  Fact Sheet for Patients: BloggerCourse.com  Fact Sheet for Healthcare Providers: SeriousBroker.it  This test is not yet approved or cleared by the [place] FDA and has been authorized for detection and/or diagnosis of SARS-CoV-2 by FDA under an Emergency Use Authorization (EUA). This EUA will remain in effect (  meaning this test can be used) for the duration of the COVID-19 declaration under Section 564(b)(1) of the Act, 21 U.S.C. section 360bbb-3(b)(1), unless the authorization is terminated or revoked.     Resp Syncytial Virus by PCR NEGATIVE NEGATIVE Final    Comment: (NOTE) Fact Sheet for Patients: BloggerCourse.com  Fact Sheet for Healthcare Providers: SeriousBroker.it  This test is not yet approved or cleared by the [place]  FDA and has been authorized for detection and/or diagnosis of SARS-CoV-2 by FDA under an Emergency Use Authorization (EUA). This EUA will remain in effect (meaning this test can be used) for the duration of the COVID-19 declaration under Section 564(b)(1) of the Act, 21 U.S.C. section 360bbb-3(b)(1), unless the authorization is terminated or revoked.  Performed at [facility], [address] [place], [place] [address]   Blood Culture (routine x 2)     Status: None (Preliminary result)   Collection Time: 03/23/24 10:58 PM   Specimen: BLOOD  Result Value Ref Range Status   Specimen Description BLOOD BLOOD RIGHT ARM  Final   Special  Requests   Final    BOTTLES DRAWN AEROBIC AND ANAEROBIC Blood Culture results may not be optimal due to an excessive volume of blood received in culture bottles   Culture   Final    NO GROWTH 3 DAYS Performed at [facility], [address], [place], [place] [address]    Report Status PENDING  Incomplete  Blood Culture (routine x 2)     Status: None (Preliminary result)   Collection Time: 03/24/24  6:53 AM   Specimen: BLOOD  Result Value Ref Range Status   Specimen Description BLOOD BLOOD RIGHT HAND  Final   Special Requests   Final    BOTTLES DRAWN AEROBIC AND ANAEROBIC Blood Culture results may not be optimal due to an inadequate volume of blood received in culture bottles   Culture   Final    NO GROWTH 2 DAYS Performed at [facility], [address] [place], [place] [address]    Report Status PENDING  Incomplete  Respiratory (~20 pathogens) panel by PCR     Status: None   Collection Time: 03/24/24 11:00 AM   Specimen: Nasopharyngeal Swab; Respiratory  Result Value Ref Range Status   Adenovirus NOT DETECTED NOT DETECTED Final   Coronavirus 229E NOT DETECTED NOT DETECTED Final    Comment: (NOTE) The Coronavirus on the Respiratory Panel, DOES NOT test for the novel  Coronavirus (2019 nCoV)    Coronavirus HKU1 NOT DETECTED NOT DETECTED Final   Coronavirus NL63 NOT DETECTED NOT DETECTED Final   Coronavirus OC43 NOT DETECTED NOT DETECTED Final   Metapneumovirus NOT DETECTED NOT DETECTED Final   Rhinovirus / Enterovirus NOT DETECTED NOT DETECTED Final   Influenza A NOT DETECTED NOT DETECTED Final   Influenza B NOT DETECTED NOT DETECTED Final   Parainfluenza Virus 1 NOT DETECTED NOT DETECTED Final   Parainfluenza Virus 2 NOT DETECTED NOT DETECTED Final   Parainfluenza Virus 3 NOT DETECTED NOT DETECTED Final   Parainfluenza Virus 4 NOT DETECTED NOT DETECTED Final   Respiratory Syncytial Virus NOT DETECTED NOT DETECTED Final   Bordetella pertussis NOT DETECTED  NOT DETECTED Final   Bordetella Parapertussis NOT DETECTED NOT DETECTED Final   Chlamydophila pneumoniae NOT DETECTED NOT DETECTED Final   Mycoplasma pneumoniae NOT DETECTED NOT DETECTED Final    Comment: Performed at [facility], [address] [place], [place] [address]     Labs:  CBC: Recent Labs  Lab 03/23/24 2205 03/24/24 0425 03/25/24 0528 03/26/24 0530  WBC 17.8* 14.6* 7.5 6.9  NEUTROABS 15.7*  --   --   --   HGB 15.3 13.9 12.4* 12.6*  HCT 44.7 41.2 37.7* 37.0*  MCV 89.6 92.2 94.0 90.9  PLT 218 176 162 182   Basic Metabolic Panel: Recent Labs  Lab 03/23/24 2205 03/24/24 0425 03/25/24 0528 03/26/24 0530  NA 136 136 137 138  K 3.5 3.2* 3.0* 3.6  CL 95* 97* 102 104  CO2 29 30 21* 28  GLUCOSE 117* 110* 42* 98  BUN 7 7 8  <5*  CREATININE <0.30* <0.30* <0.30* <0.30*  CALCIUM 9.2 8.9 8.5* 8.4*  MG  --  1.8 1.8 1.7  PHOS  --  2.7 2.8 1.9*   Liver Function Tests: Recent Labs  Lab 03/23/24 2205  AST 45*  ALT 63*  ALKPHOS 81  BILITOT 0.6  PROT 7.2  ALBUMIN 4.4   No results for input(s): "LIPASE", "AMYLASE" in the last 168 hours. No results for input(s): "AMMONIA" in the last 168 hours. Cardiac Enzymes: No results for input(s): "CKTOTAL", "CKMB", "CKMBINDEX", "TROPONINI" in the last 168 hours. BNP (last 3 results) No results for input(s): "BNP" in the last 8760 hours. CBG: Recent Labs  Lab 03/25/24 1549 03/25/24 1919 03/25/24 2349 03/26/24 0743 03/26/24 1123  GLUCAP 127* 163* 157* 143* 166*    Time spent: 35 minutes  Signed:  Althia Atlas  [facility] 03/26/2024 2:08 PM

## 2024-03-26 NOTE — Plan of Care (Signed)

## 2024-03-26 NOTE — Progress Notes (Signed)
 Pt ready for discharge. EMS at bedside. PIV removed with pt tolerating well. PEG tube C/D/I with no complications noted. Mom made aware that pt is in route with EMS home. Plan of care completed  [facility] RN

## 2024-03-27 ENCOUNTER — Ambulatory Visit: Admitting: Sleep Medicine

## 2024-03-27 ENCOUNTER — Ambulatory Visit: Payer: Self-pay | Admitting: Sleep Medicine

## 2024-03-27 NOTE — Telephone Encounter (Signed)
 This RN was notified by [facility] staff that acute virtual appt is not appropriate at this time as Dr. Kieran Pellet only manages BiPAP machine/settings. This RN instructed to cancel appt with Dr. Kieran Pellet and advise PCP.   Called Sherrlyn Dolores (mother) to notify virtual appt with Dr. Kieran Pellet will be canceled. [facility] instructed Sherrlyn Dolores to call PCP for further medication clarification and to address acute sx. Parent verbalized understanding and to call back with additional questions.

## 2024-03-27 NOTE — Telephone Encounter (Signed)
 Chief Complaint: SOB Symptoms: tachypnea Frequency: this AM Pertinent Negatives: Patient denies fever, URI sx Disposition: [] ED /[] Urgent Care (no appt availability in office) / [x] Appointment(In office/virtual)/ []  [place] [facility]/ [] Home Care/ [] Refused Recommended Disposition /[] [place] [facility]/ []  Follow-up with PCP Additional Notes: Pt caregiver Gerilyn Pilgrim  [facility]) and mother calling c/o of SOB/tachypnea noticed this AM. Pt was recently discharged from hospital and prednisolone was discontinued. Caregivers report that he was previously taking prednisolone daily as maintenance, and stopped taking medication since Sunday per MD rec's. Today, pt presented with tachypnea/SOB and mother suspects it is r/t abruptly stopping prednisolone. Mother reported to give prednisolone and initiated BiPAP that resolved tachypnea/SOB. [facility] scheduled virtual visit on 03/27/2024. Caregiver verbalized understanding and to call back/go to ED with worsening symptoms.      Copied from CRM [id number]. Topic: Clinical - Red Word Triage >> Mar 27, 2024 11:19 AM Gaetano Hawthorne wrote: [facility] that prompted transfer to Nurse Triage: Family advised by Dr. Betti Cruz to stop giving Prednisolone while he was in hospital over the weekend (medication prescribed by Dr. Sheppard Penton with PCP's office)    Home Health nurse, Veryl Speak. Cleatis Polka  is calling to report that He has starting  getting fluid in lung and shortness of breath, patient is acting really strange like something is not right - They would like to know if they could restart the medication..Patient's family concerned about rebound of symptoms that got him into the hospital. Reason for Disposition  [1] MILD difficulty breathing (e.g., minimal/no SOB at rest, SOB with walking, pulse <100) AND [2] NEW-onset or WORSE than normal  Answer Assessment - Initial Assessment Questions E2C2 Pulmonary Triage - Initial Assessment Questions "Chief Complaint (e.g., cough,  sob, wheezing, fever, chills, sweat or additional symptoms) *Go to specific symptom protocol after initial questions. SOB - rapid breathing, "ronchi in left lungs" - possibly r/t taking d/c orapred prescribed by PCP - reports taking maintenance dose of orapred 3mL daily - reports has not been receiving since Sunday  "How long have symptoms been present?" Today Reports yesterday he was "fine"  Have you tested for COVID or Flu? Note: If not, ask patient if a home test can be taken. If so, instruct patient to call back for positive results. No  MEDICINES:   "Have you used any OTC meds to help with symptoms?" No If yes, ask "What medications?" N/a  "Have you used your inhalers/maintenance medication?" Yes If yes, "What medications?" Levoalbuterol - last given this AM with improvement   If inhaler, ask "How many puffs and how often?" Note: Review instructions on medication in the chart. N/a  OXYGEN: "Do you wear supplemental oxygen?" Yes If yes, "How many liters are you supposed to use?" BiPAP at night, but is currently on at the moment   "Do you monitor your oxygen levels?" Yes If yes, "What is your reading (oxygen level) today?" Vitals during event: T97.7 SpO2 88 RR: 40s, currently HR 110  Current vitals O2 99 RR 18 HR 122   "What is your usual oxygen saturation reading?"  (Note: Pulmonary O2 sats should be 90% or greater) Upper 90s   1. RESPIRATORY STATUS: "Describe your breathing?" (e.g., wheezing, shortness of breath, unable to speak, severe coughing)      SOB 2. ONSET: "When did this breathing problem begin?"      This AM 3. PATTERN "Does the difficult breathing come and go, or has it been constant since it started?"  intermittent 4. SEVERITY: "How bad is your breathing?" (e.g., mild, moderate, severe)    - MILD: No SOB at rest, mild SOB with walking, speaks normally in sentences, can lie down, no retractions, pulse < 100.    - MODERATE: SOB at rest, SOB  with minimal exertion and prefers to sit, cannot lie down flat, speaks in phrases, mild retractions, audible wheezing, pulse 100-120.    - SEVERE: Very SOB at rest, speaks in single words, struggling to breathe, sitting hunched forward, retractions, pulse > 120      Mild-moderate 5. RECURRENT SYMPTOM: "Have you had difficulty breathing before?" If Yes, ask: "When was the last time?" and "What happened that time?"      No, has never been off prednisolone x 3 days 6. CARDIAC HISTORY: "Do you have any history of heart disease?" (e.g., heart attack, angina, bypass surgery, angioplasty)      denies 7. LUNG HISTORY: "Do you have any history of lung disease?"  (e.g., pulmonary embolus, asthma, emphysema)     duchenne 8. CAUSE: "What do you think is causing the breathing problem?"      Abrupt stopping prednisolone 9. OTHER SYMPTOMS: "Do you have any other symptoms? (e.g., dizziness, runny nose, cough, chest pain, fever)     Dry cough  Protocols used: Breathing Difficulty-A-AH

## 2024-03-28 ENCOUNTER — Telehealth (INDEPENDENT_AMBULATORY_CARE_PROVIDER_SITE_OTHER): Payer: Self-pay

## 2024-03-28 LAB — CULTURE, BLOOD (ROUTINE X 2): Culture: NO GROWTH

## 2024-03-28 NOTE — Telephone Encounter (Signed)
 Received a call from [facility] the home health nurse who stated that that the ER nurse told them to stop taking the Prednisolone,  He stated that they called the ER and informed mom and Home Health nurse that that was a mistake on their end and that Lucas [name] should continue to take that medication.   Lucas [name] just called in wanting to make us  aware.   I informed Lucas [name] that I did not see the error or correction of error in his chart but I would relay the message to the provider.    Lucas [name] verbalized understanding of this.  SS, CCMA

## 2024-03-29 LAB — CULTURE, BLOOD (ROUTINE X 2): Culture: NO GROWTH

## 2024-04-01 ENCOUNTER — Encounter (INDEPENDENT_AMBULATORY_CARE_PROVIDER_SITE_OTHER): Payer: Self-pay | Admitting: Pediatrics

## 2024-04-01 MED ORDER — POLYETHYLENE GLYCOL 3350 17 GM/SCOOP PO POWD: ORAL | 3 refills | Status: DC

## 2024-04-01 NOTE — Telephone Encounter (Signed)
 Thank you.  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   Marny Sires MD MPH

## 2024-04-04 ENCOUNTER — Telehealth: Payer: Self-pay

## 2024-04-04 NOTE — Telephone Encounter (Signed)
 Received a fax to filled out paper work for patient. Patient has not been seen since 11/09/2022 and needs a appointment. Called mother and offered to scheduled a appointment for patient before Dr. Lorrayne Rosier the end of May. She states that he is going to be going to [facility] and will make a appointment with them. She declined to make a appointment. Faxed paper working back to [employer] care informing them this information.

## 2024-04-11 ENCOUNTER — Telehealth (INDEPENDENT_AMBULATORY_CARE_PROVIDER_SITE_OTHER): Payer: Self-pay | Admitting: Pediatrics

## 2024-04-11 ENCOUNTER — Encounter (INDEPENDENT_AMBULATORY_CARE_PROVIDER_SITE_OTHER): Payer: Self-pay

## 2024-04-11 NOTE — Telephone Encounter (Signed)
 Attempted to contact Home Health Nurse to inform him of the referral being sent back in March.  Home Health nurse unable to be reached. Unable to LVM.   Referral resent to [facility] today.   SS, CCMA

## 2024-04-11 NOTE — Telephone Encounter (Signed)
 Who's calling (name and relationship to patient) : Jacob A, Home health care  Best contact number: [phone]  Provider they see: [facility]  Reason for call: Lucas [name] is calling in regarding Lucas [name]. He wanted to know if he was referred to a new GI provider, and if so, that providers name. He is requesting a call back.    Call ID:      PRESCRIPTION REFILL ONLY  Name of prescription:  Pharmacy:

## 2024-04-23 ENCOUNTER — Telehealth: Payer: Self-pay

## 2024-04-23 NOTE — Telephone Encounter (Signed)
 Copied from CRM [id number]. Topic: Clinical - Home Health Verbal Orders >> Apr 23, 2024  3:21 PM Margarette Shawl wrote: Caller/Agency: Faith, [facility] Callback Number: [phone], FX# [phone] Service Requested: Rep is requesting updated CMN for CPAP supplies, confirmed pt was seen on 03/31  Any new concerns about the patient? No

## 2024-04-25 ENCOUNTER — Other Ambulatory Visit: Payer: Self-pay

## 2024-04-25 DIAGNOSIS — G4733 Obstructive sleep apnea (adult) (pediatric): Secondary | ICD-10-CM

## 2024-04-25 NOTE — Telephone Encounter (Signed)
 Supply order request has been submitted.

## 2024-05-08 NOTE — Progress Notes (Addendum)
 See provider note from same day

## 2024-05-13 ENCOUNTER — Encounter (INDEPENDENT_AMBULATORY_CARE_PROVIDER_SITE_OTHER): Payer: Self-pay | Admitting: Pediatrics

## 2024-05-13 ENCOUNTER — Telehealth (INDEPENDENT_AMBULATORY_CARE_PROVIDER_SITE_OTHER): Payer: Self-pay | Admitting: Pediatrics

## 2024-05-13 DIAGNOSIS — G40209 Localization-related (focal) (partial) symptomatic epilepsy and epileptic syndromes with complex partial seizures, not intractable, without status epilepticus: Secondary | ICD-10-CM | POA: Diagnosis not present

## 2024-05-13 DIAGNOSIS — G7101 Duchenne or Becker muscular dystrophy: Secondary | ICD-10-CM | POA: Diagnosis not present

## 2024-05-13 DIAGNOSIS — Z931 Gastrostomy status: Secondary | ICD-10-CM

## 2024-05-13 NOTE — Patient Instructions (Addendum)
 Symptom management: Please video Lucas [name] making the noise and send it to me  Continue medications Care Coordination: We will reach out to [facility] about Lucas [name]'s GI referral I recommend calling Dr. Reddy's office about Lucas [name]'s face mask Equipment needs: Ordered a wheelchair and shower chair. Sent a PT referral for an evaluation.  Ordered g-tube, extension tubings, and formula to [facility] Ordered diapers

## 2024-05-14 ENCOUNTER — Encounter (INDEPENDENT_AMBULATORY_CARE_PROVIDER_SITE_OTHER): Payer: Self-pay

## 2024-06-17 NOTE — Progress Notes (Unsigned)
 Cardiology Office Note  Date:  06/18/2024   ID:  Lucas [name], DOB 15-Jun-2001, MRN [id number]  PCP:  Diedra Lame, MD   Chief Complaint  Patient presents with   New Patient (Initial Visit)    Ref by Dr. Diedra for Sinus tachycardia.     HPI:  Mr. Lucas [name] is a 23 year old gentleman with past medical history of Restrictive lung disease Respiratory failure on BiPAP Autism Muscular dystrophy Who presents by referral from Dr. Ned for sinus tachycardia  Patient is nonverbal, presents in wheelchair with family who provide most of the history  Prior cardiac imaging reviewed Echocardiogram performed April 04, 2023 [facility] Low normal ejection fraction Otherwise no significant abnormality  Several recent hospitalizations reviewed  hospitalized from 2/27 to 02/13/2024 with acute respiratory failure requiring BiPAP and SIRS criteria (tachycardia and tachypnea) required CPR  In hospital 03/2024 SIRS, respiratory Brief course antibiotics, recovered with respiratory support  Presents today with family, caretaker, they feel he is at his baseline Heart rate much improved taking metoprolol  tartrate 50 mg twice a day BP also improved on metoprolol    Allergy to several medications including lisinopril  Has wound bridge of his nose which they attribute to his BiPAP mask,  looking better  EKG personally reviewed by myself on todays visit EKG Interpretation Date/Time:  Tuesday June 18 2024 11:54:08 EDT Ventricular Rate:  112 PR Interval:  138 QRS Duration:  82 QT Interval:  322 QTC Calculation: 439 R Axis:   -24  Text Interpretation: Sinus tachycardia Minimal voltage criteria for LVH, may be normal variant ( R in aVL ) Possible Inferior infarct , age undetermined When compared with ECG of 23-Mar-2024 21:59, No significant change was found Confirmed by Perla Lye [phone]) on 06/18/2024 11:58:51 AM     PMH:   has a past medical history of Autism, Autism, Community acquired  pneumonia of left lower lobe of lung (03/03/2022), Diarrhea (06/09/2015), Family history of adverse reaction to anesthesia, Muscular dystrophy (HCC), Scoliosis, Seizures (HCC), Sepsis (HCC) (03/03/2022), Transient alteration of awareness, Viral gastroenteritis, and Yeast infection of the skin (10/24/2020).  PSH:    Past Surgical History:  Procedure Laterality Date   CIRCUMCISION     at birth   LAPAROSCOPIC GASTROSTOMY N/A 05/24/2017   Procedure: LAPAROSCOPIC GASTROSTOMY TUBE PLACEMENT;  Surgeon: Chuckie Casimiro KIDD, MD;  Location: [name];  Service: General;  Laterality: N/A;   SPINE SURGERY N/A    Phreesia 06/29/2020    Current Outpatient Medications  Medication Sig Dispense Refill   acetaminophen  (TYLENOL ) 160 MG/5ML elixir Take 20.3 mLs (650 mg total) by mouth every 4 (four) hours as needed for pain. 120 mL 0   carbamazepine  (TEGRETOL ) 100 MG chewable tablet TAKE 1 & 1/2 TABLETS BY MOUTH TWICE A DAY 270 tablet 2   Control Gel Formula Dressing (DUODERM CGF SPOTS EXTRA THIN) MISC Apply 1 Units topically.     hydrOXYzine  (ATARAX ) 10 MG/5ML syrup Place 12.5 mLs (25 mg total) into feeding tube 3 (three) times daily as needed for anxiety. 240 mL 0   levalbuterol  (XOPENEX ) 1.25 MG/3ML nebulizer solution Take 1.25 mg by nebulization every 4 (four) hours as needed.     metoprolol  tartrate (LOPRESSOR ) 50 MG tablet Place 1 tablet (50 mg total) into feeding tube 2 (two) times daily. 60 tablet 1   Nutritional Supplements (PEDIASURE GROW & GAIN) LIQD Drink 1 bottle spaced out 4 times a day 28440 mL 12   nystatin  (MYCOSTATIN /NYSTOP ) powder Apply 1 Application topically 3 (three) times  daily as needed.     polyethylene glycol powder (GOODSENSE CLEARLAX) 17 GM/SCOOP powder TAKE 17 GM BY MOUTH EVERY OTHER DAY WITH 8oz PRUNE JUICE 3060 g 3   ZINC  OXIDE, TOPICAL, EX Apply topically once daily as needed Rash around G-tube and perineal area     No current facility-administered medications for this visit.      Allergies:   Lisinopril, Penicillins, Actical, Apple juice, Docusate sodium , Metamucil [psyllium], Multivitamins, Other, Aloe vera, Calcitonin, Esomeprazole magnesium , Omeprazole, Simethicone , Sunflower oil, and Vitamin d  analogs   Social History:  The patient  reports that he has never smoked. He has been exposed to tobacco smoke. He has never used smokeless tobacco. He reports that he does not drink alcohol and does not use drugs.   Family History:   family history includes Alcohol abuse in his father; Asthma in an other family member; Cancer in his paternal grandfather; Dementia in his paternal grandmother; Heart failure in his maternal grandmother; Hyperlipidemia in his mother and another family member; Hypertension in his maternal grandmother and another family member; Learning disabilities in his mother; Other in his maternal aunt, maternal grandmother, maternal uncle, and mother; Vision loss in an other family member.    Review of Systems: Review of Systems  Constitutional: Negative.   HENT: Negative.    Respiratory: Negative.    Cardiovascular: Negative.   Gastrointestinal: Negative.   Musculoskeletal: Negative.   Neurological: Negative.   Psychiatric/Behavioral: Negative.    All other systems reviewed and are negative.   PHYSICAL EXAM: VS:  BP 108/80 (BP Location: Right Arm, Patient Position: Sitting, Cuff Size: Normal)   Pulse (!) 112   Ht 5' (1.524 m)   Wt 113 lb (51.3 kg) Comment: check at last hospital visit  SpO2 94%   BMI 22.07 kg/m  , BMI Body mass index is 22.07 kg/m. GEN: Well nourished, in wheelchair, contractures of hands and feet HEENT: normal, deep wound on bridge of his nose Neck: no JVD, carotid bruits, or masses Cardiac: RRR; no murmurs, rubs, or gallops,no edema  Respiratory:  clear to auscultation bilaterally, normal work of breathing GI: soft, nontender, nondistended, + BS MS: Contractures hands and feet Skin: warm and dry, no rash Neuro: Full  exam not performed Psych: Nonverbal, alert   Recent Labs: 03/23/2024: ALT 63 03/24/2024: TSH 0.930 03/26/2024: BUN <5; Creatinine, Ser <0.30; Hemoglobin 12.6; Magnesium  1.7; Platelets 182; Potassium 3.6; Sodium 138    Lipid Panel No results found for: CHOL, HDL, LDLCALC, TRIG    Wt Readings from Last 3 Encounters:  06/18/24 113 lb (51.3 kg)  03/26/24 109 lb 5.6 oz (49.6 kg)  03/11/24 113 lb 4.5 oz (51.4 kg)       ASSESSMENT AND PLAN:  Problem List Items Addressed This Visit     Sinus tachycardia - Primary   Relevant Orders   EKG 12-Lead (Completed)   Sinus tachycardia Baseline sinus tachycardia on metoprolol  tartrate 50 twice daily, asymptomatic, echocardiogram 2024 was essentially normal -Elevated heart rates on hospitalizations end of February, March April in the setting of respiratory distress,SIRS,  rates improved since that time as he has recovered Little room to titrate metoprolol  heart rate given low blood pressure, recommend he continue 50 mg twice daily No additional workup needed at this time  Autism, Duchenne muscular dystrophy with generalized weakness, 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 [facility] [place], [place] [phone]

## 2024-06-18 ENCOUNTER — Encounter: Payer: Self-pay | Admitting: Cardiovascular Disease

## 2024-06-18 ENCOUNTER — Ambulatory Visit: Attending: Cardiovascular Disease | Admitting: Cardiovascular Disease

## 2024-06-18 VITALS — BP 108/80 | HR 112 | Ht 60.0 in | Wt 113.0 lb

## 2024-06-18 DIAGNOSIS — R000 Tachycardia, unspecified: Secondary | ICD-10-CM | POA: Insufficient documentation

## 2024-06-18 NOTE — Patient Instructions (Signed)

## 2024-06-30 ENCOUNTER — Encounter (INDEPENDENT_AMBULATORY_CARE_PROVIDER_SITE_OTHER): Payer: Self-pay | Admitting: Pediatrics

## 2024-06-30 MED ORDER — PEDIASURE GROW & GAIN PO LIQD: ORAL | 12 refills | Status: DC

## 2024-06-30 NOTE — Progress Notes (Signed)
 Patient: Lucas [name] MRN: [id number] Sex: male DOB: Jun 16, 2001  Provider: Corean Geralds, MD Location of Care: [facility] Note type: Routine return visit   This is a [facility] E-Visit follow up consult provided via MyChart Lucas [name] and their parent/guardian Lucas [name] consented to an E-Visit consult today.  Location of patient: Lucas [name] is at home in [place], [place] Location of provider: Corean Geralds, MD is at [facility]  The following participants were involved in this E-Visit:  Corean Geralds, MD, Lucas [name], CMA, Lucas [name], Scribe, Lucas [name], patient, and their parent/guardian Lucas [name].   This visit was done via VIDEO    History of Present Illness: Referral Source: Lucas CANDIE Larch, MD  History from: patient and prior records Chief Complaint: Pediatric Complex Care  Lucas [name] is a 23 y.o. male with history of Duchenne Muscular Dystrophy, autism spectrum disorder, level 3, restrictive lung disease, compensated congestive heart failure, severe dysphagia requiring tube feeding, and obstructive sleep apnea who I am seeing in follow-up for complex care management. Patient was last seen on 02/19/2024 where I continued his medications, continued Miralax  and prune juice for constipation, stopped sodium in his feeds, recommended getting labs and sodium checked at Dr. Genell office, ordered a sleep study, and planned to follow up on [facility] referral.  Since that appointment, patient was hospitalized on 03/23/2024 for tachycardia and rash. They started antibiotics for sepsis and started Atarax  as needed for anxiety to help tachycardia.    Patient presents today with mother who reports the following:   Symptom management:  Mom feels he had a stomach bug when he went to the hospital in April. They mistakenly told mom to stop prednisone at the hospital but mom has restarted it. He has not needed Atarax   for anxiety.   Mom has not noticed any seizures, but reports that he makes a noise. His heart rate and breathing don't change. His PCP witnessed it and was not worried per mom's report.   Care coordination (other providers): Patient saw Dr. Jess with [facility] on 03/11/2024 where she ordered a new face mask for his BIPAP to prevent skin breakdown and recommended only wearing the mask when asleep. Mom has not gotten new face mask. Dr. Jess said that Lucas did not need a sleep study right now and discussed doing it at home in the future because he could not transfer himself.   Patient is not scheduled with GI. Mom has not heard from [facility].    Equipment needs:  Patient needs a new manual wheelchair and shower chair. The shower chair's wheels are falling off so mom would like a new one quickly. He has outgrown his wheelchair. His lift is old so mom feels he needs a new one. He had tried to get an electric one, but was denied.   Mom requested an order for g-tube 14 Fr 2.3 cm mickey and supplies. Mom not sure who is ordering formula and requests an order for that as well, Pediasure Grow and Gain 4 per day.   Mom feels his diapers are too small. He currently wears XL diapers, and mom would like XXL.   Past Medical History Past Medical History:  Diagnosis Date   Autism    Autism    Community acquired pneumonia of left lower lobe of lung 03/03/2022   Diarrhea 06/09/2015   Family history of adverse reaction to anesthesia    MGGM- N/V   Muscular  dystrophy (HCC)    Scoliosis    Seizures (HCC)    Last one 2013   Sepsis (HCC) 03/03/2022   Transient alteration of awareness    Viral gastroenteritis    Yeast infection of the skin 10/24/2020    Surgical History Past Surgical History:  Procedure Laterality Date   CIRCUMCISION     at birth   LAPAROSCOPIC GASTROSTOMY N/A 05/24/2017   Procedure: LAPAROSCOPIC GASTROSTOMY TUBE PLACEMENT;  Surgeon: Lucas Casimiro KIDD, MD;  Location:  [name];  Service: General;  Laterality: N/A;   SPINE SURGERY N/A    Phreesia 06/29/2020    Family History family history includes Alcohol abuse in his father; Asthma in an other family member; Cancer in his paternal grandfather; Dementia in his paternal grandmother; Heart failure in his maternal grandmother; Hyperlipidemia in his mother and another family member; Hypertension in his maternal grandmother and another family member; Learning disabilities in his mother; Other in his maternal aunt, maternal grandmother, maternal uncle, and mother; Vision loss in an other family member.   Social History Social History   Social History Narrative   He lives with mother.   He graduated high school in 22.    He is not currently in a day program     Allergies Allergies  Allergen Reactions   Lisinopril Rash    Red bubbly rash   Penicillins Hives and Rash    Has patient had a PCN reaction causing immediate rash, facial/tongue/throat swelling, SOB or lightheadedness with hypotension: Yes Has patient had a PCN reaction causing severe rash involving mucus membranes or skin necrosis: Yes Has patient had a PCN reaction that required hospitalization: No Has patient had a PCN reaction occurring within the last 10 years: No If all of the above answers are NO, then may proceed with Cephalosporin use.    Actical     Red in face, itchy, bumps   Apple Juice    Docusate Sodium      Rash and cant sleep   Metamucil [Psyllium] Other (See Comments)    Interacted with seizure medicine   Multivitamins     Red in face, itchy, bumps   Other Other (See Comments)    Senkot -- gets rash and cant sleep   Aloe Vera Rash    Mom report red little dots after applying onto face   Calcitonin Rash   Esomeprazole Magnesium  Nausea And Vomiting   Omeprazole Other (See Comments)    Constipation and possible rash after drug was stopped   Simethicone  Palpitations and Rash   Sunflower Oil Itching, Rash and Other  (See Comments)    GI upset - Avoid products containing this additive   Vitamin D  Analogs Other (See Comments)    Rash on face if it contains sunflower seed oil Excessive urine output and thirsty, increase in gas    Medications Current Outpatient Medications on File Prior to Visit  Medication Sig Dispense Refill   carbamazepine  (TEGRETOL ) 100 MG chewable tablet TAKE 1 & 1/2 TABLETS BY MOUTH TWICE A DAY 270 tablet 2   Control Gel Formula Dressing (DUODERM CGF SPOTS EXTRA THIN) MISC Apply 1 Units topically.     metoprolol  tartrate (LOPRESSOR ) 50 MG tablet Place 1 tablet (50 mg total) into feeding tube 2 (two) times daily. 60 tablet 1   nystatin  (MYCOSTATIN /NYSTOP ) powder Apply 1 Application topically 3 (three) times daily as needed.     polyethylene glycol powder (GOODSENSE CLEARLAX) 17 GM/SCOOP powder TAKE 17 GM BY MOUTH EVERY OTHER DAY  WITH 8oz PRUNE JUICE 3060 g 3   ZINC  OXIDE, TOPICAL, EX Apply topically once daily as needed Rash around G-tube and perineal area     acetaminophen  (TYLENOL ) 160 MG/5ML elixir Take 20.3 mLs (650 mg total) by mouth every 4 (four) hours as needed for pain. 120 mL 0   hydrOXYzine  (ATARAX ) 10 MG/5ML syrup Place 12.5 mLs (25 mg total) into feeding tube 3 (three) times daily as needed for anxiety. 240 mL 0   levalbuterol  (XOPENEX ) 1.25 MG/3ML nebulizer solution Take 1.25 mg by nebulization every 4 (four) hours as needed.     No current facility-administered medications on file prior to visit.   The medication list was reviewed and reconciled. All changes or newly prescribed medications were explained.  A complete medication list was provided to the patient/caregiver.  Physical Exam There were no vitals taken for this visit. Weight for age: Facility age limit for growth %iles is 20 years.  Length for age: Facility age limit for growth %iles is 20 years. BMI: There is no height or weight on file to calculate BMI. No results found. Exam limited by virtual  visit General: NAD, well nourished  HEENT: normocephalic, no eye or nose discharge.  MMM. Open sore on bridge of nose.  Cardiovascular: pale but well perfused Lungs: Normal work of breathing. Abdomen: non distended, gtube in place.  Extremities:Contractures throughout.  Neuro: Awake, alert.  Limited speech. Moves arms at least against gravity.  Limited movement in legs.  Nonambulatory.      Diagnosis:  1. Duchenne muscular dystrophy (HCC)   2. Partial epilepsy with impairment of consciousness   3. Feeding by G-tube (HCC)      Assessment and Plan ALPHONZA TRAMELL is a 23 y.o. male with history of Duchenne Muscular Dystrophy, autism spectrum disorder, level 3, restrictive lung disease, compensated congestive heart failure, severe dysphagia requiring tube feeding, and obstructive sleep apnea who presents for follow-up in the pediatric complex care clinic. Patient overall doing well. Primarily focused on care coordination as patient is due to see GI and equipment needs as detailed below.   Symptom management:  Requested mom video Issaiah making the noise and send it to me. I am not concerned these events are seizure.   Continue Tegretol  150 mg BID  Care coordination: Plan to reach out to [facility] about [facility]'s GI referral Recommend calling Dr. Chase office about Jonatha's face mask as his current face mask is causing a sore on his nose.   Case management needs:  No new case management needs  Equipment needs:  Ordered diapers. Due to patient's medical condition, patient is indefinitely incontinent of stool and urine.  It is medically necessary for them to use diapers, underpads, and gloves to assist with hygiene and skin integrity.  They require a frequency of up to 200 a month. Ordered a wheelchair. Patient would functionally benefit from a wheelchair to assist with safe transportation to appointments and other activities. He is unable to sit unsupported in a typical manual  wheelchair. He has outgrown his current one. This is important to prevent skin breakdown while continuing to support transportation and mobility needs. Ordered a shower chair and sent a PT referral for an evaluation. Patient would functionally benefit from a bath chair to keep him safe in the shower as he has outgrown his current one. Ordered g-tube, extension tubings, and formula to [facility]. Patient will benefit as he is unable to consume enough calories PO to sustain life.   Decision  making/Advanced care planning: Not addressed at today's visit, patient has a MOST form on file.   The CARE PLAN for reviewed and revised to represent the changes above.  This is available in Epic under snapshot, and a physical binder provided to the patient, that can be used for anyone providing care for the patient.    I spend 40 minutes on day of service on this patient including review of chart, discussion with patient and family, coordination with other providers and management of orders and paperwork. This time does not include does include any behavioral screenings, baclofen pump refills, or VNS interrogations.   Return in about 3 months (around 08/13/2024).  I, Lucas [name], scribed for and in the presence of Corean Geralds, MD at today's visit on 05/13/2024.  I, Corean Geralds MD MPH, personally performed the services described in this documentation, as scribed by Lucas [name] in my presence on 05/13/2024 and it is accurate, complete, and reviewed by me.     Corean Geralds MD MPH Neurology,  Neurodevelopment and Neuropalliative care [facility]  [address] [place], [place], [place] [address] Phone: [phone]

## 2024-07-01 ENCOUNTER — Other Ambulatory Visit (INDEPENDENT_AMBULATORY_CARE_PROVIDER_SITE_OTHER): Payer: Self-pay | Admitting: Pediatrics

## 2024-07-01 ENCOUNTER — Encounter (INDEPENDENT_AMBULATORY_CARE_PROVIDER_SITE_OTHER): Payer: Self-pay

## 2024-07-02 ENCOUNTER — Other Ambulatory Visit (INDEPENDENT_AMBULATORY_CARE_PROVIDER_SITE_OTHER): Payer: Self-pay | Admitting: Family

## 2024-07-02 MED ORDER — PEDIASURE GROW & GAIN PO LIQD: ORAL | 12 refills | Status: DC

## 2024-07-18 ENCOUNTER — Telehealth (INDEPENDENT_AMBULATORY_CARE_PROVIDER_SITE_OTHER): Payer: Self-pay | Admitting: Pediatrics

## 2024-07-18 NOTE — Telephone Encounter (Signed)
 Who's calling (name and relationship to patient) : Proofreader  Best contact number: [phone]  Provider they see: Dr. waddell  Reason for call: Following up on Medicaid form that was faxed over on 07/10/24   Call ID:      PRESCRIPTION REFILL ONLY  Name of prescription:  Pharmacy:

## 2024-07-24 ENCOUNTER — Encounter (INDEPENDENT_AMBULATORY_CARE_PROVIDER_SITE_OTHER): Payer: Self-pay

## 2024-07-24 ENCOUNTER — Telehealth (INDEPENDENT_AMBULATORY_CARE_PROVIDER_SITE_OTHER): Payer: Self-pay | Admitting: Family

## 2024-07-24 DIAGNOSIS — Z931 Gastrostomy status: Secondary | ICD-10-CM

## 2024-07-24 MED ORDER — PEDIASURE GROW & GAIN PO LIQD
ORAL | 12 refills | Status: AC
Start: 2024-07-24 — End: ?

## 2024-07-24 NOTE — Telephone Encounter (Signed)
 I attempted to call Mom but there was no answer and no option for voicemail. I will send her a MyChart message.

## 2024-07-24 NOTE — Telephone Encounter (Signed)
 Mom called back and said that Lucas [name] has been receiving 5 cans per day of Pediasure since his hospitalization in April. She asked for the order to be updated to reflect 5 cans per day which I will do. He needs a follow up appointment with Dr Waddell and the dietician, and I will send a message to the scheduler for that.

## 2024-07-24 NOTE — Telephone Encounter (Signed)
 Who's calling (name and relationship to patient) : Dia Jefferys; guardian  Best contact number: [phone]  Provider they see: Ellouise Bollman, NP   Reason for call: Randine called in wanting to talk to [facility] regarding the denial for Pediasure, and that she would need to resubmit it.  She is requesting to speak with Ellouise only.     Call ID:      PRESCRIPTION REFILL ONLY  Name of prescription:  Pharmacy:

## 2024-07-25 NOTE — Telephone Encounter (Signed)
  Lucas [name] called back stating the only form that she is missing is the medicaid form that was faxed on [phone].

## 2024-08-14 NOTE — Telephone Encounter (Signed)
 Insurance will not approve Pediasure Grow and Gain as it is a pediatric formula and he has not proven intolerance or allergy.

## 2024-08-15 NOTE — Progress Notes (Unsigned)
 Lucas [name]   MRN:  [id number]  September 25, 2001   Provider: Ellouise Bollman NP-C Location of Care: [facility]  Visit type: Home visit  Last visit: 05/13/2024 with Dr Waddell  Referral source: Lucas Lame, MD History from: Epic chart and patient's mother  Brief history:  Copied from previous record: History of Duchenne Muscular Dystrophy, autism spectrum disorder, level 3, restrictive lung disease, compensated congestive heart failure, severe dysphagia requiring tube feeding, and obstructive sleep apnea   I went to patient's home today to take him some samples of Pediasure Grow and Gain. I also took some Peptamen Jr out of concern that he will completely run out of formula before receiving a shipment.   I explained to Mom that [employer] has denied coverage for Pediasure Grow and Gain as it is pediatric formula and he is an adult. I explained that an appeal was made by the DME company but that insurance has continued to deny coverage. I explained that it is important for Mom to contact Lucas [name]'s GI provider for orders for an adult formula. Mom was displeased but agreed to ask Lucas [name]'s private duty nurse to call.   Allergies as of 08/16/2024       Reactions   Lisinopril Rash   Red bubbly rash   Penicillins Hives, Rash   Has patient had a PCN reaction causing immediate rash, facial/tongue/throat swelling, SOB or lightheadedness with hypotension: Yes Has patient had a PCN reaction causing severe rash involving mucus membranes or skin necrosis: Yes Has patient had a PCN reaction that required hospitalization: No Has patient had a PCN reaction occurring within the last 10 years: No If all of the above answers are NO, then may proceed with Cephalosporin use.   Actical    Red in face, itchy, bumps   Apple Juice    Docusate Sodium     Rash and cant sleep   Metamucil [psyllium] Other (See Comments)   Interacted with seizure  medicine   Multivitamins    Red in face, itchy, bumps   Other Other (See Comments)   Senkot -- gets rash and cant sleep   Aloe Vera Rash   Mom report red little dots after applying onto face   Calcitonin Rash   Esomeprazole Magnesium  Nausea And Vomiting   Omeprazole Other (See Comments)   Constipation and possible rash after drug was stopped   Simethicone  Palpitations, Rash   Sunflower Oil Itching, Rash, Other (See Comments)   GI upset - Avoid products containing this additive   Vitamin D  Analogs Other (See Comments)   Rash on face if it contains sunflower seed oil Excessive urine output and thirsty, increase in gas        Medication List        Accurate as of August 15, 2024  8:09 PM. If you have any questions, ask your nurse or doctor.          acetaminophen  160 MG/5ML elixir Commonly known as: TYLENOL  Take 20.3 mLs (650 mg total) by mouth every 4 (four) hours as needed for pain.   carbamazepine  100 MG chewable tablet Commonly known as: TEGRETOL  TAKE 1 & 1/2 TABLETS BY MOUTH TWICE A DAY   DuoDERM CGF Spots Extra Thin Misc Apply 1 Units topically.   hydrOXYzine  10 MG/5ML syrup Commonly known as: ATARAX  Place 12.5 mLs (25 mg total) into feeding tube 3 (three) times daily as needed for anxiety.   levalbuterol  1.25 MG/3ML nebulizer solution Commonly known  as: XOPENEX  Take 1.25 mg by nebulization every 4 (four) hours as needed.   metoprolol  tartrate 50 MG tablet Commonly known as: LOPRESSOR  Place 1 tablet (50 mg total) into feeding tube 2 (two) times daily.   nystatin  powder Commonly known as: MYCOSTATIN /NYSTOP  Apply 1 Application topically 3 (three) times daily as needed.   PediaSure Grow & Gain Liqd Give 1 container ( ) 5 times a day via pump at 237ml/hour   polyethylene glycol powder 17 GM/SCOOP powder Commonly known as: GoodSense ClearLax TAKE 17 GM BY MOUTH EVERY OTHER DAY WITH 8oz PRUNE JUICE   prednisoLONE  15 MG/5ML solution Commonly known  as: ORAPRED  PLACE 4 MLS INTO FEEDING TUBE DAILY   ZINC  OXIDE (TOPICAL) EX Apply topically once daily as needed Rash around G-tube and perineal area         Ellouise Bollman NP-C [facility] [address] [place], [place] [address] Ph. [phone] Fax [phone]

## 2024-08-16 ENCOUNTER — Other Ambulatory Visit (INDEPENDENT_AMBULATORY_CARE_PROVIDER_SITE_OTHER): Payer: Self-pay | Admitting: Family

## 2024-08-16 ENCOUNTER — Encounter (INDEPENDENT_AMBULATORY_CARE_PROVIDER_SITE_OTHER): Payer: Self-pay | Admitting: Family

## 2024-08-16 DIAGNOSIS — Z931 Gastrostomy status: Secondary | ICD-10-CM

## 2024-08-16 DIAGNOSIS — G7101 Duchenne or Becker muscular dystrophy: Secondary | ICD-10-CM

## 2024-08-16 NOTE — Patient Instructions (Signed)
 It was a pleasure to see you today!  Instructions for you until your next appointment are as follows: Call Dreshaun's GI provider to request orders for an adult formula as his insurance will not pay for a pediatric formula Please sign up for MyChart if you have not done so. Please plan to return for follow up with Dr Waddell later this month or sooner if needed.  Feel free to contact our office during normal business hours at [phone] with questions or concerns. If there is no answer or the call is outside business hours, please leave a message and our clinic staff will call you back within the next business day.  If you have an urgent concern, please stay on the line for our after-hours answering service and ask for the on-call neurologist.     I also encourage you to use MyChart to communicate with me more directly. If you have not yet signed up for MyChart within [facility], the front desk staff can help you. However, please note that this inbox is NOT monitored on nights or weekends, and response can take up to 2 business days.  Urgent matters should be discussed with the on-call pediatric neurologist.   At [facility], we are committed to providing exceptional care. You will receive a patient satisfaction survey through text or email regarding your visit today. Your opinion is important to me. Comments are appreciated.

## 2024-08-28 NOTE — Progress Notes (Incomplete)
 Patient: Lucas [name] MRN: [id number] Sex: male DOB: May 02, 2001  Provider: Corean Geralds, MD Location of Care: Pediatric Specialist- Pediatric Complex Care Note type: Routine return visit  History of Present Illness: Referral Source: Edie CANDIE Larch, MD  History from: patient and prior records Chief Complaint: Pediatric Complex Care  Lucas [name] is a 23 y.o. male with history of Duchenne Muscular Dystrophy, autism spectrum disorder, level 3, restrictive lung disease, compensated congestive heart failure, severe dysphagia requiring tube feeding, and obstructive sleep apnea who I am seeing in follow-up for complex care management. Patient was last seen on 05/13/2024 where I continued his medications, requested mom video his events, and planned to follow up on GI referral and face mask order.  Since that appointment, patient has not been to the hospital or ED.   Patient presents today with {CHL AMB PARENT/GUARDIAN:[id number]} who reports the following:   Symptom management:     Care coordination (other providers): Patient saw Dr. Unk with [facility] on 06/03/2024 where she put in orders for feeding supplies and pediatric formula.   Patient saw Dr. Gollan with [facility] cardiology on 06/18/2024 where he continued his medications and recommended follow up as needed.   Patient saw Ellouise Bollman, [facility] on 08/16/2024 in order to get formula samples as his insurance is no longer providing pediatric formula. Mom agreed to call GI to talk through different adult formula options.   Case management needs:   Equipment needs:  At the last visit, ordered a wheelchair and shower chair.   Dr. Chase office resent the order for a face mask for his bipap.   Decision making/Advanced care planning:  Diagnostics/Patient history:   Past Medical History Past Medical History:  Diagnosis Date   Autism    Autism    Community acquired pneumonia of left lower lobe of lung 03/03/2022   Diarrhea  06/09/2015   Family history of adverse reaction to anesthesia    MGGM- N/V   Muscular dystrophy (HCC)    Scoliosis    Seizures (HCC)    Last one 2013   Sepsis (HCC) 03/03/2022   Transient alteration of awareness    Viral gastroenteritis    Yeast infection of the skin 10/24/2020    Surgical History Past Surgical History:  Procedure Laterality Date   CIRCUMCISION     at birth   LAPAROSCOPIC GASTROSTOMY N/A 05/24/2017   Procedure: LAPAROSCOPIC GASTROSTOMY TUBE PLACEMENT;  Surgeon: Chuckie Casimiro KIDD, MD;  Location: [name];  Service: General;  Laterality: N/A;   SPINE SURGERY N/A    Phreesia 06/29/2020    Family History family history includes Alcohol abuse in his father; Asthma in an other family member; Cancer in his paternal grandfather; Dementia in his paternal grandmother; Heart failure in his maternal grandmother; Hyperlipidemia in his mother and another family member; Hypertension in his maternal grandmother and another family member; Learning disabilities in his mother; Other in his maternal aunt, maternal grandmother, maternal uncle, and mother; Vision loss in an other family member.   Social History Social History   Social History Narrative   He lives with mother.   He graduated high school in 22.    He is not currently in a day program     Allergies Allergies  Allergen Reactions   Lisinopril Rash    Red bubbly rash   Penicillins Hives and Rash    Has patient had a PCN reaction causing immediate rash, facial/tongue/throat swelling, SOB or lightheadedness with hypotension: Yes Has patient had  a PCN reaction causing severe rash involving mucus membranes or skin necrosis: Yes Has patient had a PCN reaction that required hospitalization: No Has patient had a PCN reaction occurring within the last 10 years: No If all of the above answers are NO, then may proceed with Cephalosporin use.    Actical     Red in face, itchy, bumps   Apple Juice    Docusate Sodium      Rash  and cant sleep   Metamucil [Psyllium] Other (See Comments)    Interacted with seizure medicine   Multivitamins     Red in face, itchy, bumps   Other Other (See Comments)    Senkot -- gets rash and cant sleep   Aloe Vera Rash    Mom report red little dots after applying onto face   Calcitonin Rash   Esomeprazole Magnesium  Nausea And Vomiting   Omeprazole Other (See Comments)    Constipation and possible rash after drug was stopped   Simethicone  Palpitations and Rash   Sunflower Oil Itching, Rash and Other (See Comments)    GI upset - Avoid products containing this additive   Vitamin D  Analogs Other (See Comments)    Rash on face if it contains sunflower seed oil Excessive urine output and thirsty, increase in gas    Medications Current Outpatient Medications on File Prior to Visit  Medication Sig Dispense Refill   acetaminophen  (TYLENOL ) 160 MG/5ML elixir Take 20.3 mLs (650 mg total) by mouth every 4 (four) hours as needed for pain. 120 mL 0   carbamazepine  (TEGRETOL ) 100 MG chewable tablet TAKE 1 & 1/2 TABLETS BY MOUTH TWICE A DAY 270 tablet 2   Control Gel Formula Dressing (DUODERM CGF SPOTS EXTRA THIN) MISC Apply 1 Units topically.     hydrOXYzine  (ATARAX ) 10 MG/5ML syrup Place 12.5 mLs (25 mg total) into feeding tube 3 (three) times daily as needed for anxiety. 240 mL 0   levalbuterol  (XOPENEX ) 1.25 MG/3ML nebulizer solution Take 1.25 mg by nebulization every 4 (four) hours as needed.     metoprolol  tartrate (LOPRESSOR ) 50 MG tablet Place 1 tablet (50 mg total) into feeding tube 2 (two) times daily. 60 tablet 1   Nutritional Supplements (PEDIASURE GROW & GAIN) LIQD Give 1 container ( ) 5 times a day via pump at 237ml/hour 35550 mL 12   nystatin  (MYCOSTATIN /NYSTOP ) powder Apply 1 Application topically 3 (three) times daily as needed.     polyethylene glycol powder (GOODSENSE CLEARLAX) 17 GM/SCOOP powder TAKE 17 GM BY MOUTH EVERY OTHER DAY WITH 8oz PRUNE JUICE 3060 g 3    prednisoLONE  (ORAPRED ) 15 MG/5ML solution PLACE 4 MLS INTO FEEDING TUBE DAILY 360 mL 3   ZINC  OXIDE, TOPICAL, EX Apply topically once daily as needed Rash around G-tube and perineal area     No current facility-administered medications on file prior to visit.   The medication list was reviewed and reconciled. All changes or newly prescribed medications were explained.  A complete medication list was provided to the patient/caregiver.  Physical Exam There were no vitals taken for this visit. Weight for age: Facility age limit for growth %iles is 20 years.  Length for age: Facility age limit for growth %iles is 20 years. BMI: There is no height or weight on file to calculate BMI. No results found.   Diagnosis: No diagnosis found.   Assessment and Plan Lucas [name] is a 23 y.o. male with history of Duchenne Muscular Dystrophy, autism spectrum disorder, level 3,  restrictive lung disease, compensated congestive heart failure, severe dysphagia requiring tube feeding, and obstructive sleep apnea who presents for follow-up in the pediatric complex care clinic.  Symptom management:     Care coordination:  Case management needs:   Equipment needs:  Due to patient's medical condition, patient is indefinitely incontinent of stool and urine.  It is medically necessary for them to use diapers, underpads, and gloves to assist with hygiene and skin integrity.  They require a frequency of up to 200 a month.   Decision making/Advanced care planning:  The CARE PLAN for reviewed and revised to represent the changes above.  This is available in Epic under snapshot, and a physical binder provided to the patient, that can be used for anyone providing care for the patient.    I spend ** minutes on day of service on this patient including review of chart, discussion with patient and family, coordination with other providers and management of orders and paperwork.      No follow-ups on  file.  Corean Geralds MD MPH Neurology,  Neurodevelopment and Neuropalliative care [facility] Child Neurology  [address] [place], [place], [place] [address] Phone: [phone]

## 2024-08-29 ENCOUNTER — Telehealth (INDEPENDENT_AMBULATORY_CARE_PROVIDER_SITE_OTHER): Payer: Self-pay | Admitting: Pediatrics

## 2024-08-29 NOTE — Telephone Encounter (Signed)
 Medicaid transportation called back and reiterated that [place], the transportation vendor they use does not transport outside of the county after 11 AM. I informed the representative, [place], that we were not aware of this and asked if there was another vendor that we could use?   [place] transferred me to the transportation supervisor, [employer], to see if she would be able to switch vendors.   Annalsia stated that she does not believe that they have any venders who are willing to do this, but she would see. She stated that if they refuse then there is nothing that can be done about it on their end.   Annalisa stated that she would call back if refused.   SS, CCMA

## 2024-08-29 NOTE — Telephone Encounter (Signed)
 Contacted patients mother.  Verified patients name and DOB as well as mothers name.  Mom stated that Roselie is his home health nurse. She provided the number [phone] as the number for the transportation representative that she spoke to. Mom was informed by this representative that the cut off for appointment transportation was 11 AM.   Mom stated that their office closes at 3 PM, so we may not be able to reach anyone today.   I attempted to contact the transportation representative at the number above, unable to be reached.  LVM to call back.   SS, CCMA

## 2024-08-29 NOTE — Telephone Encounter (Signed)
 Attempted to contact Scharlotte back.  She was unable to be reached.  LVM to call back.   SS, CCMA

## 2024-08-29 NOTE — Telephone Encounter (Signed)
 Who's calling (name and relationship to patient) : Rueben Moats LPN, [employer]   Best contact number: [phone]  Provider they see: Dr. Waddell   Reason for call: Medicaid Transportation wants to know if appt can be changed to an earlier time, they do not pick up that late from the home. She is requesting a call back, and will be out of the office after 3 pm.    Call ID:      PRESCRIPTION REFILL ONLY  Name of prescription:  Pharmacy:

## 2024-08-30 NOTE — Telephone Encounter (Signed)
 Scharlotte Davis LPN, [employer] is calling back regarding the appt. She stated that the appt that is schedule would need to be rescheduled for an earlier time or another date due to transportation. She said it has been 2 days and appt has not be changed. She stated that mom, can be called back regarding appt.

## 2024-08-30 NOTE — Telephone Encounter (Signed)
 Contacted patients mother.  Verified patients name and DOB as well as mothers name. Ms. Roselie was present for this call.   I informed mom that I was able to speak to Medicaid transportation yesterday and we're just waiting for an approval or denial from the other vender. Mom asked when do we expect to hear back from them. I informed mom that it would either be today or Monday.   Mom verbalized understanding of this!  SS, CCMA

## 2024-08-31 ENCOUNTER — Other Ambulatory Visit: Payer: Self-pay

## 2024-08-31 ENCOUNTER — Inpatient Hospital Stay
Admission: EM | Admit: 2024-08-31 | Discharge: 2024-09-03 | DRG: 871 | Disposition: A | Discharge status: Home / Self Care | Attending: Internal Medicine | Admitting: Internal Medicine

## 2024-08-31 ENCOUNTER — Emergency Department

## 2024-08-31 DIAGNOSIS — A419 Sepsis, unspecified organism: Secondary | ICD-10-CM | POA: Insufficient documentation

## 2024-08-31 DIAGNOSIS — J189 Pneumonia, unspecified organism: Principal | ICD-10-CM | POA: Diagnosis present

## 2024-08-31 DIAGNOSIS — J9601 Acute respiratory failure with hypoxia: Secondary | ICD-10-CM

## 2024-08-31 DIAGNOSIS — G7100 Muscular dystrophy, unspecified: Secondary | ICD-10-CM | POA: Diagnosis present

## 2024-08-31 DIAGNOSIS — Z1152 Encounter for screening for COVID-19: Secondary | ICD-10-CM | POA: Diagnosis not present

## 2024-08-31 DIAGNOSIS — Z931 Gastrostomy status: Secondary | ICD-10-CM

## 2024-08-31 DIAGNOSIS — R1310 Dysphagia, unspecified: Secondary | ICD-10-CM | POA: Diagnosis present

## 2024-08-31 DIAGNOSIS — G809 Cerebral palsy, unspecified: Secondary | ICD-10-CM | POA: Diagnosis present

## 2024-08-31 DIAGNOSIS — R0602 Shortness of breath: Secondary | ICD-10-CM | POA: Diagnosis present

## 2024-08-31 DIAGNOSIS — L89813 Pressure ulcer of head, stage 3: Secondary | ICD-10-CM | POA: Diagnosis present

## 2024-08-31 DIAGNOSIS — I2510 Atherosclerotic heart disease of native coronary artery without angina pectoris: Secondary | ICD-10-CM | POA: Diagnosis present

## 2024-08-31 LAB — RESPIRATORY PANEL BY PCR

## 2024-08-31 LAB — CBC WITH DIFFERENTIAL/PLATELET
Abs Immature Granulocytes: 0.15 K/uL — ABNORMAL HIGH (ref 0.00–0.07)
Basophils Absolute: 0.1 K/uL (ref 0.0–0.1)
Basophils Relative: 1 %
Eosinophils Absolute: 0 K/uL (ref 0.0–0.5)
Eosinophils Relative: 0 %
HCT: 47.1 % (ref 39.0–52.0)
Hemoglobin: 14.4 g/dL (ref 13.0–17.0)
Immature Granulocytes: 1 %
Lymphocytes Relative: 6 %
Lymphs Abs: 0.6 K/uL — ABNORMAL LOW (ref 0.7–4.0)
MCH: 28.3 pg (ref 26.0–34.0)
MCHC: 30.6 g/dL (ref 30.0–36.0)
MCV: 92.7 fL (ref 80.0–100.0)
Monocytes Absolute: 0.8 K/uL (ref 0.1–1.0)
Monocytes Relative: 7 %
Neutro Abs: 9.7 K/uL — ABNORMAL HIGH (ref 1.7–7.7)
Neutrophils Relative %: 85 %
Platelets: 339 K/uL (ref 150–400)
RBC: 5.08 MIL/uL (ref 4.22–5.81)
RDW: 14.3 % (ref 11.5–15.5)
WBC: 11.3 K/uL — ABNORMAL HIGH (ref 4.0–10.5)
nRBC: 0 % (ref 0.0–0.2)

## 2024-08-31 LAB — URINALYSIS, W/ REFLEX TO CULTURE (INFECTION SUSPECTED)
Bacteria, UA: NONE SEEN
Bilirubin Urine: NEGATIVE
Glucose, UA: NEGATIVE mg/dL
Hgb urine dipstick: NEGATIVE
Ketones, ur: 5 mg/dL — AB
Leukocytes,Ua: NEGATIVE
Nitrite: NEGATIVE
Protein, ur: 30 mg/dL — AB
Specific Gravity, Urine: 1.017 (ref 1.005–1.030)
Squamous Epithelial / HPF: 0 /HPF (ref 0–5)
pH: 7 (ref 5.0–8.0)

## 2024-08-31 LAB — PROTIME-INR
INR: 1 (ref 0.8–1.2)
Prothrombin Time: 14.1 s (ref 11.4–15.2)

## 2024-08-31 LAB — COMPREHENSIVE METABOLIC PANEL WITH GFR
ALT: 73 U/L — ABNORMAL HIGH (ref 0–44)
AST: 48 U/L — ABNORMAL HIGH (ref 15–41)
Albumin: 3.7 g/dL (ref 3.5–5.0)
Alkaline Phosphatase: 66 U/L (ref 38–126)
Anion gap: 15 (ref 5–15)
BUN: 13 mg/dL (ref 6–20)
CO2: 34 mmol/L — ABNORMAL HIGH (ref 22–32)
Calcium: 9.2 mg/dL (ref 8.9–10.3)
Chloride: 91 mmol/L — ABNORMAL LOW (ref 98–111)
Creatinine, Ser: <0.3 mg/dL — ABNORMAL LOW (ref 0.61–1.24)
Glucose, Bld: 128 mg/dL — ABNORMAL HIGH (ref 70–99)
Potassium: 3.6 mmol/L (ref 3.5–5.1)
Sodium: 140 mmol/L (ref 135–145)
Total Bilirubin: 0.7 mg/dL (ref 0.0–1.2)
Total Protein: 7.6 g/dL (ref 6.5–8.1)

## 2024-08-31 LAB — STREP PNEUMONIAE URINARY ANTIGEN: Strep Pneumo Urinary Antigen: NEGATIVE

## 2024-08-31 LAB — LACTIC ACID, PLASMA: Lactic Acid, Venous: 0.8 mmol/L (ref 0.5–1.9)

## 2024-08-31 LAB — HIV ANTIBODY (ROUTINE TESTING W REFLEX): HIV Screen 4th Generation wRfx: NONREACTIVE

## 2024-08-31 LAB — RESP PANEL BY RT-PCR (RSV, FLU A&B, COVID)  RVPGX2
Influenza A by PCR: NEGATIVE
Influenza B by PCR: NEGATIVE
Resp Syncytial Virus by PCR: NEGATIVE
SARS Coronavirus 2 by RT PCR: NEGATIVE

## 2024-08-31 MED ORDER — IOHEXOL 350 MG/ML SOLN
75.0000 mL | Freq: Once | INTRAVENOUS | Status: AC | PRN
Start: 2024-08-31 — End: 2024-08-31
  Administered 2024-08-31: 75 mL via INTRAVENOUS

## 2024-08-31 MED ORDER — SODIUM CHLORIDE 0.9 % IV SOLN
1.0000 g | Freq: Once | INTRAVENOUS | Status: AC
  Administered 2024-08-31: 1 g via INTRAVENOUS
  Filled 2024-08-31: qty 10

## 2024-08-31 MED ORDER — ENOXAPARIN SODIUM 40 MG/0.4ML IJ SOSY
40.0000 mg | PREFILLED_SYRINGE | INTRAMUSCULAR | Status: DC
  Administered 2024-09-01 – 2024-09-02 (×2): 40 mg via SUBCUTANEOUS
  Filled 2024-08-31 (×2): qty 0.4

## 2024-08-31 MED ORDER — GUAIFENESIN 100 MG/5ML PO LIQD: 5.0000 mL | ORAL | Status: DC | PRN

## 2024-08-31 MED ORDER — CARBAMAZEPINE 100 MG PO CHEW
150.0000 mg | CHEWABLE_TABLET | Freq: Two times a day (BID) | ORAL | Status: DC
  Filled 2024-08-31: qty 1.5

## 2024-08-31 MED ORDER — ONDANSETRON HCL 4 MG PO TABS: 4.0000 mg | ORAL_TABLET | Freq: Four times a day (QID) | ORAL | Status: DC | PRN

## 2024-08-31 MED ORDER — SODIUM CHLORIDE 0.9 % IV SOLN: INTRAVENOUS | Status: DC

## 2024-08-31 MED ORDER — SODIUM CHLORIDE 0.9 % IV SOLN
500.0000 mg | Freq: Once | INTRAVENOUS | Status: AC
  Administered 2024-08-31: 500 mg via INTRAVENOUS
  Filled 2024-08-31: qty 5

## 2024-08-31 MED ORDER — SODIUM CHLORIDE 0.9 % IV SOLN
2.0000 g | INTRAVENOUS | Status: DC
  Administered 2024-09-01 – 2024-09-03 (×3): 2 g via INTRAVENOUS
  Filled 2024-08-31 (×3): qty 20

## 2024-08-31 MED ORDER — IPRATROPIUM-ALBUTEROL 0.5-2.5 (3) MG/3ML IN SOLN
3.0000 mL | Freq: Four times a day (QID) | RESPIRATORY_TRACT | Status: DC
  Administered 2024-08-31 – 2024-09-03 (×7): 3 mL via RESPIRATORY_TRACT
  Filled 2024-08-31 (×8): qty 3

## 2024-08-31 MED ORDER — ACETAMINOPHEN 650 MG RE SUPP: 650.0000 mg | Freq: Four times a day (QID) | RECTAL | Status: DC | PRN

## 2024-08-31 MED ORDER — SODIUM CHLORIDE 0.9 % IV SOLN
500.0000 mg | INTRAVENOUS | Status: DC
  Administered 2024-09-01 – 2024-09-03 (×3): 500 mg via INTRAVENOUS
  Filled 2024-08-31 (×3): qty 5

## 2024-08-31 MED ORDER — METOPROLOL TARTRATE 50 MG PO TABS
50.0000 mg | ORAL_TABLET | Freq: Two times a day (BID) | ORAL | Status: DC
  Administered 2024-08-31 – 2024-09-03 (×6): 50 mg
  Filled 2024-08-31 (×3): qty 1
  Filled 2024-08-31 (×2): qty 2
  Filled 2024-08-31: qty 1

## 2024-08-31 MED ORDER — PEDIASURE 1.5 CAL PO LIQD
237.0000 mL | Freq: Three times a day (TID) | ORAL | Status: DC
Start: 2024-08-31 — End: 2024-09-02
  Administered 2024-08-31 – 2024-09-01 (×3): 237 mL
  Filled 2024-08-31: qty 237

## 2024-08-31 MED ORDER — ONDANSETRON HCL 4 MG/2ML IJ SOLN: 4.0000 mg | Freq: Four times a day (QID) | INTRAMUSCULAR | Status: DC | PRN

## 2024-08-31 MED ORDER — ACETAMINOPHEN 325 MG PO TABS: 650.0000 mg | ORAL_TABLET | Freq: Four times a day (QID) | ORAL | Status: DC | PRN

## 2024-08-31 NOTE — ED Notes (Signed)
 Patients mother administered medications at bedside through G-tube. Per EDP Arlander this is okay. Mother provided with syringe.

## 2024-08-31 NOTE — ED Notes (Signed)
 Unable to gain IV access. IV team ordered. Lab called to collect labs.

## 2024-08-31 NOTE — ED Provider Notes (Addendum)
 [facility] Provider Note    Event Date/Time   First MD Initiated Contact with Patient 08/31/24 0745     (approximate)   History   Shortness of Breath  Patient is nonverbal, history per mother HPI  Lucas [name] is a 23 y.o. male who presents with reported shortness of breath.  Per mother his oxygen saturation when checked by nurse this morning was 78%, she reports it was 98% before bed last night.  She reports his mouth movements are a tic which is chronic     Physical Exam   Triage Vital Signs: ED Triage Vitals  Encounter Vitals Group     BP 08/31/24 0751 (!) 132/92     Girls Systolic BP Percentile --      Girls Diastolic BP Percentile --      Boys Systolic BP Percentile --      Boys Diastolic BP Percentile --      Pulse Rate 08/31/24 0745 (!) 139     Resp 08/31/24 0745 18     Temp 08/31/24 0747 99 F (37.2 C)     Temp Source 08/31/24 0745 Rectal     SpO2 08/31/24 0745 100 %     Weight 08/31/24 0747 59.5 kg (131 lb 2.8 oz)     Height 08/31/24 0746 1.626 m (5' 4)     Head Circumference --      Peak Flow --      Pain Score --      Pain Loc --      Pain Education --      Exclude from Growth Chart --     Most recent vital signs: Vitals:   08/31/24 1030 08/31/24 1045  BP: 124/65   Pulse: (!) 146   Resp: (!) 30 (!) 22  Temp:    SpO2: 100%      General: Awake, no distress.  CV:  Good peripheral perfusion.  Tachycardia Resp:  Normal effort.  No wheezing Abd:  No distention.  Other:  Frequent mouth movements Mild erythema to the lower extremities and face, not consistent with cellulitis, question viral rash versus allergic reaction   ED Results / Procedures / Treatments   Labs (all labs ordered are listed, but only abnormal results are displayed) Labs Reviewed  COMPREHENSIVE METABOLIC PANEL WITH GFR - Abnormal; Notable for the following components:      Result Value   Chloride 91 (*)    CO2 34 (*)    Glucose, Bld 128 (*)     Creatinine, Ser <0.30 (*)    AST 48 (*)    ALT 73 (*)    All other components within normal limits  CBC WITH DIFFERENTIAL/PLATELET - Abnormal; Notable for the following components:   WBC 11.3 (*)    Neutro Abs 9.7 (*)    Lymphs Abs 0.6 (*)    Abs Immature Granulocytes 0.15 (*)    All other components within normal limits  URINALYSIS, W/ REFLEX TO CULTURE (INFECTION SUSPECTED) - Abnormal; Notable for the following components:   Color, Urine YELLOW (*)    APPearance CLOUDY (*)    Ketones, ur 5 (*)    Protein, ur 30 (*)    All other components within normal limits  RESP PANEL BY RT-PCR (RSV, FLU A&B, COVID)  RVPGX2  CULTURE, BLOOD (ROUTINE X 2)  CULTURE, BLOOD (ROUTINE X 2)  LACTIC ACID, PLASMA  PROTIME-INR     EKG  ED ECG REPORT I, Lamar Price, the attending  physician, personally viewed and interpreted this ECG.   Rhythm: Tachycardia QRS Axis: normal Intervals: normal ST/T Wave abnormalities: normal Narrative Interpretation: no evidence of acute ischemia    RADIOLOGY Chest x-ray without acute abnormality    PROCEDURES:  Critical Care performed: yes  CRITICAL CARE Performed by: Lamar Price   Total critical care time: 30 minutes  Critical care time was exclusive of separately billable procedures and treating other patients.  Critical care was necessary to treat or prevent imminent or life-threatening deterioration.  Critical care was time spent personally by me on the following activities: development of treatment plan with patient and/or surrogate as well as nursing, discussions with consultants, evaluation of patient's response to treatment, examination of patient, obtaining history from patient or surrogate, ordering and performing treatments and interventions, ordering and review of laboratory studies, ordering and review of radiographic studies, pulse oximetry and re-evaluation of patient's condition.   Procedures   MEDICATIONS ORDERED IN  ED: Medications  cefTRIAXone  (ROCEPHIN ) 1 g in sodium chloride  0.9 % 100 mL IVPB (1 g Intravenous New Bag/Given 08/31/24 1143)  azithromycin  (ZITHROMAX ) 500 mg in sodium chloride  0.9 % 250 mL IVPB (has no administration in time range)  iohexol  (OMNIPAQUE ) 350 MG/ML injection 75 mL (75 mLs Intravenous Contrast Given 08/31/24 1015)     IMPRESSION / MDM / ASSESSMENT AND PLAN / ED COURSE  I reviewed the triage vital signs and the nursing notes. Patient's presentation is most consistent with acute presentation with potential threat to life or bodily function.  Patient presents with reports of hypoxia, no started on nonrebreather by EMS oxygen saturations are 100% here upon arrival switched to nasal cannula oxygen without issue.  But does continue to require supplemental oxygen  Mother has provided additional history, hypoxia this morning but no reports of fevers, possibly mild cough intermittently.  Heart rate is typically in the 120s but he has not had his metoprolol  yet  She also reports that he has had a rash to his lower extremities and face for the last 4 days  Chest x-ray is overall reassuring but limited exam, will send for CT angiography  CT scan is most consistent with pneumonia, IV antibiotics started Will admit to the hospitalist team     FINAL CLINICAL IMPRESSION(S) / ED DIAGNOSES   Final diagnoses:  Community acquired pneumonia of right lower lobe of lung  Acute respiratory failure with hypoxia (HCC)     Rx / DC Orders   ED Discharge Orders     None        Note:  This document was prepared using Dragon voice recognition software and may include unintentional dictation errors.   Price Lamar, MD 08/31/24 1141    Price Lamar, MD 08/31/24 [phone]

## 2024-08-31 NOTE — H&P (Signed)
 History and Physical    Dellas Guard FMW:[id number] DOB: 10-24-2001 DOA: 08/31/2024  PCP: Diedra Lame, MD (Confirm with patient/family/NH records and if not entered, this has to be entered at [facility] point of entry) Patient coming from: Home  I have personally briefly reviewed patient's old medical records in [facility]  Chief Complaint: Fever, cough  HPI: Lucas [name] is a 23 y.o. male with medical history significant of cerebral palsy, nonverbal at baseline, dysphagia on PEG tube feeding, brought in by family member for evaluation of fever and cough.  Patient is nonverbal at baseline, all history given by mother at bedside.  Patient had a fever of 102 on Tuesday and started to have a cough since yesterday, the cough has been dry.  ED Course: Temperature 99, tachycardia heart rate 120-130, blood pressure 130/70 O2 saturation 100% on 6 L.  CTA showed right lower lobe pneumonia and small pleural effusion.  Patient was given ceftriaxone  and azithromycin .blood work showed WBC 11.3 hemoglobin 13, BUN 13 creatinine 0.3 glucose 128.  0 point review of systems negative.   Review of Systems: Unable to perform, patient is nonverbal  History reviewed. No pertinent past medical history.  History reviewed. No pertinent surgical history.   has no history on file for tobacco use, alcohol use, and drug use.  Not on File  History reviewed. No pertinent family history.  Prior to Admission medications   Not on File    Physical Exam: Vitals:   08/31/24 1045 08/31/24 1244 08/31/24 1253 08/31/24 1310  BP:  110/64  106/61  Pulse:  (!) 140 (!) 112 100  Resp: (!) 22 (!) 27 (!) 26 (!) 23  Temp:  97.9 F (36.6 C)    TempSrc:  Oral    SpO2:  100% 100% 100%  Weight:      Height:        Constitutional: NAD, calm, comfortable Vitals:   08/31/24 1045 08/31/24 1244 08/31/24 1253 08/31/24 1310  BP:  110/64  106/61  Pulse:  (!) 140 (!) 112 100  Resp: (!) 22 (!) 27 (!) 26 (!) 23  Temp:   97.9 F (36.6 C)    TempSrc:  Oral    SpO2:  100% 100% 100%  Weight:      Height:       Eyes: PERRL, lids and conjunctivae normal ENMT: Mucous membranes are moist. Posterior pharynx clear of any exudate or lesions.Normal dentition.  Neck: normal, supple, no masses, no thyromegaly Respiratory: clear to auscultation bilaterally, no wheezing, right-sided crackles, respiratory effort. No accessory muscle use.  Cardiovascular: Regular rate and rhythm, no murmurs / rubs / gallops. No extremity edema. 2+ pedal pulses. No carotid bruits.  Abdomen: no tenderness, no masses palpated. No hepatosplenomegaly. Bowel sounds positive.  Musculoskeletal: no clubbing / cyanosis. No joint deformity upper and lower extremities. Good ROM, no contractures. Normal muscle tone.  Skin: no rashes, lesions, ulcers. No induration Neurologic: Awake, no facial droops Psychiatric: Awake, nonverbal    Labs on Admission: I have personally reviewed following labs and imaging studies  CBC: Recent Labs  Lab 08/31/24 0836  WBC 11.3*  NEUTROABS 9.7*  HGB 14.4  HCT 47.1  MCV 92.7  PLT 339   Basic Metabolic Panel: Recent Labs  Lab 08/31/24 0836  NA 140  K 3.6  CL 91*  CO2 34*  GLUCOSE 128*  BUN 13  CREATININE <0.30*  CALCIUM 9.2   GFR: CrCl cannot be calculated (This lab value cannot be used to calculate  CrCl because it is not a number: <0.30). Liver Function Tests: Recent Labs  Lab 08/31/24 0836  AST 48*  ALT 73*  ALKPHOS 66  BILITOT 0.7  PROT 7.6  ALBUMIN 3.7   No results for input(s): LIPASE, AMYLASE in the last 168 hours. No results for input(s): AMMONIA in the last 168 hours. Coagulation Profile: Recent Labs  Lab 08/31/24 0836  INR 1.0   Cardiac Enzymes: No results for input(s): CKTOTAL, CKMB, CKMBINDEX, TROPONINI in the last 168 hours. BNP (last 3 results) No results for input(s): PROBNP in the last 8760 hours. HbA1C: No results for input(s): HGBA1C in the last  72 hours. CBG: No results for input(s): GLUCAP in the last 168 hours. Lipid Profile: No results for input(s): CHOL, HDL, LDLCALC, TRIG, CHOLHDL, LDLDIRECT in the last 72 hours. Thyroid Function Tests: No results for input(s): TSH, T4TOTAL, FREET4, T3FREE, THYROIDAB in the last 72 hours. Anemia Panel: No results for input(s): VITAMINB12, FOLATE, FERRITIN, TIBC, IRON, RETICCTPCT in the last 72 hours. Urine analysis:    Component Value Date/Time   COLORURINE YELLOW (A) 08/31/2024 0753   APPEARANCEUR CLOUDY (A) 08/31/2024 0753   LABSPEC 1.017 08/31/2024 0753   PHURINE 7.0 08/31/2024 0753   GLUCOSEU NEGATIVE 08/31/2024 0753   HGBUR NEGATIVE 08/31/2024 0753   BILIRUBINUR NEGATIVE 08/31/2024 0753   KETONESUR 5 (A) 08/31/2024 0753   PROTEINUR 30 (A) 08/31/2024 0753   NITRITE NEGATIVE 08/31/2024 0753   LEUKOCYTESUR NEGATIVE 08/31/2024 0753    Radiological Exams on Admission: CT Angio Chest PE W and/or Wo Contrast Result Date: 08/31/2024 CLINICAL DATA:  Difficulty breathing. History of muscular dystrophy. Clinical concern for pulmonary embolus. EXAM: CT ANGIOGRAPHY CHEST WITH CONTRAST TECHNIQUE: Multidetector CT imaging of the chest was performed using the standard protocol during bolus administration of intravenous contrast. Multiplanar CT image reconstructions and MIPs were obtained to evaluate the vascular anatomy. RADIATION DOSE REDUCTION: This exam was performed according to the departmental dose-optimization program which includes automated exposure control, adjustment of the mA and/or kV according to patient size and/or use of iterative reconstruction technique. CONTRAST:  75mL OMNIPAQUE  IOHEXOL  350 MG/ML SOLN COMPARISON:  11/19/2022 FINDINGS: Cardiovascular: The heart size is normal. No substantial pericardial effusion. No thoracic aortic aneurysm. No substantial atherosclerotic calcification of the thoracic aorta. No CT evidence for acute pulmonary  embolus. Assessment of subsegmental and segmental pulmonary arteries to the right lung is degraded by beam hardening artifact from spinal hardware. Mediastinum/Nodes: No mediastinal lymphadenopathy. There is no hilar lymphadenopathy. The esophagus has normal imaging features. There is no axillary lymphadenopathy. Lungs/Pleura: Right lower lobe collapse/consolidation is new in the interval with small to moderate right pleural effusion, also new since prior. There is some minimal atelectasis at the left base. Probable trace left pleural effusion. Upper Abdomen: Visualized portion of the upper abdomen shows no acute findings. Musculoskeletal: Thoracolumbar scoliosis with extensive thoracolumbar fusion hardware evident. Bones are diffusely demineralized. Review of the MIP images confirms the above findings. IMPRESSION: 1. No CT evidence for acute pulmonary embolus. Assessment of subsegmental and segmental pulmonary arteries to the right lung is degraded by beam hardening artifact from spinal hardware. 2. Right lower lobe collapse/consolidation with small to moderate right pleural effusion, new in the interval. 3. Probable trace left pleural effusion with minimal atelectasis at the left base. Electronically Signed   By: Camellia Candle M.D.   On: 08/31/2024 11:15   DG Chest Port 1 View Result Date: 08/31/2024 CLINICAL DATA:  Sepsis.  Muscular dystrophy. EXAM: PORTABLE CHEST 1  VIEW COMPARISON:  None Available. FINDINGS: Low volume film. Cardiopericardial silhouette is at upper limits of normal for size. Vascular congestion without overt edema. No pneumothorax. Basilar atelectasis with probable small right pleural effusion. Extensive thoracolumbar fusion hardware evident. Telemetry leads overlie the chest. IMPRESSION: Low volume film with vascular congestion and basilar atelectasis. Electronically Signed   By: Camellia Candle M.D.   On: 08/31/2024 08:50    EKG: Independently reviewed.  Sinus tachycardia, no acute ST  changes.  Assessment/Plan Principal Problem:   PNA (pneumonia) Active Problems:   Sepsis (HCC)   CAP (community acquired pneumonia)  (please populate well all problems here in Problem List. (For example, if patient is on BP meds at home and you resume or decide to hold them, it is a problem that needs to be her. 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 - Other supportive care, cough medications  Dysphagia PEG tube feeding - Continue PEG tube feeding - N.p.o. at baseline as per mom  DVT prophylaxis: Lovenox  Code Status: Full code Family Communication: Mother over the phone Disposition Plan: Expect more than 2 midnight hospital stay as patient has sepsis requiring IV antibiotics and IV fluid. Consults called: None Admission status: Telemetry observation   Cort ONEIDA Mana MD [facility] Pager [phone]  08/31/2024, 2:21 PM

## 2024-08-31 NOTE — ED Notes (Signed)
EDP in room  

## 2024-08-31 NOTE — ED Notes (Signed)
Pharmacy contacted for med rec  

## 2024-08-31 NOTE — ED Notes (Signed)
 CCMD called for monitoring

## 2024-08-31 NOTE — ED Notes (Signed)
 Updated EDP on pt and unable to gain IV access or labs and that primary RN called IV team and phlebotomy.

## 2024-08-31 NOTE — ED Notes (Signed)
 PHLOBOTOMY IN ROOM AND IV TEAM

## 2024-08-31 NOTE — ED Notes (Addendum)
Urinalysis collected at this time.

## 2024-08-31 NOTE — ED Notes (Signed)
 Messaged MD about med req. to be completed on this patient. Per mother none of the medications he takes at home have been ordered.

## 2024-08-31 NOTE — ED Triage Notes (Signed)
 pt BIB [facility] from Home for trouble breathing. Pt 95% on Bag mask. Unsure of baseline, pt has hx muscular dystrophy and is bed bound. EMS stating need to return to home to pick up mom. Pt is non-verbal.

## 2024-08-31 NOTE — ED Notes (Signed)
 Changed patient before transport to floor. Patients brief clean and new brief and bed pad applied.

## 2024-09-01 DIAGNOSIS — J189 Pneumonia, unspecified organism: Secondary | ICD-10-CM | POA: Diagnosis not present

## 2024-09-01 LAB — BASIC METABOLIC PANEL WITH GFR
Anion gap: 9 (ref 5–15)
BUN: 11 mg/dL (ref 6–20)
CO2: 38 mmol/L — ABNORMAL HIGH (ref 22–32)
Calcium: 8.8 mg/dL — ABNORMAL LOW (ref 8.9–10.3)
Chloride: 96 mmol/L — ABNORMAL LOW (ref 98–111)
Creatinine, Ser: <0.3 mg/dL — ABNORMAL LOW (ref 0.61–1.24)
Glucose, Bld: 77 mg/dL (ref 70–99)
Potassium: 3.6 mmol/L (ref 3.5–5.1)
Sodium: 143 mmol/L (ref 135–145)

## 2024-09-01 LAB — GLUCOSE, CAPILLARY: Glucose-Capillary: 108 mg/dL — ABNORMAL HIGH (ref 70–99)

## 2024-09-01 MED ORDER — CARBAMAZEPINE 100 MG PO CHEW
150.0000 mg | CHEWABLE_TABLET | Freq: Two times a day (BID) | ORAL | Status: DC
Start: 2024-09-01 — End: 2024-09-03
  Administered 2024-09-01 – 2024-09-03 (×5): 150 mg
  Filled 2024-09-01 (×7): qty 1.5

## 2024-09-01 MED ORDER — SMOG ENEMA
400.0000 mL | Freq: Once | RECTAL | Status: AC
Start: 2024-09-01 — End: 2024-09-01
  Administered 2024-09-01: 400 mL via RECTAL
  Filled 2024-09-01: qty 960

## 2024-09-01 MED ORDER — PREDNISOLONE SODIUM PHOSPHATE 15 MG/5ML PO SOLN
9.0000 mg | Freq: Every day | ORAL | Status: DC
Start: 2024-09-01 — End: 2024-09-03
  Administered 2024-09-01 – 2024-09-03 (×3): 9 mg
  Filled 2024-09-01 (×3): qty 3

## 2024-09-01 MED ORDER — POLYETHYLENE GLYCOL 3350 17 G PO PACK
17.0000 g | PACK | Freq: Every day | ORAL | Status: DC
  Administered 2024-09-01 – 2024-09-03 (×3): 17 g
  Filled 2024-09-01 (×3): qty 1

## 2024-09-01 MED ORDER — PREDNISOLONE SODIUM PHOSPHATE 15 MG/5ML PO SOLN: 9.0000 mg | Freq: Every day | ORAL | Status: DC

## 2024-09-01 MED ORDER — PREDNISOLONE SODIUM PHOSPHATE 15 MG/5ML PO SOLN: 12.0000 mg | Freq: Every day | ORAL | Status: DC

## 2024-09-01 NOTE — ED Notes (Signed)
Pt cleaned of urinary incontinence.

## 2024-09-01 NOTE — Progress Notes (Addendum)
 PROGRESS NOTE    Lucas [name]  FMW:[id number] DOB: 2001/01/30 DOA: 08/31/2024 PCP: Diedra Lame, MD   Assessment & Plan:   Principal Problem:   PNA (pneumonia) Active Problems:   Sepsis (HCC)   CAP (community acquired pneumonia)  Assessment and Plan: Sepsis: met criteria w/ tachycardia, leukocytosis, & CAP. Continue on IV rocephin , azithromycin , bronchodilators.  CAP: continue on IV rocephin , azithromycin , bronchodilators    Hx of muscular dystrophy: will restart home dose of prednisone when pharmacy can confirm dosage. Pt's mother says the pt takes 3mL daily but this is not on med rec and I am unable to find this dosage in epic. Pharmacy will look into this. Non-verbal at baseline. No hx of cerebral palsy as per pt's mother  Chronic dysphagia: continue w/ tube feeds. NPO at baseline as per pt's mother       DVT prophylaxis: lovenox   Code Status: full  Family Communication: discussed pt's care w/ pt's mother and answered her questions Disposition Plan: likely d/c back home  Level of care: Progressive  Status is: Inpatient Remains inpatient appropriate because: severity of illness    Consultants:    Procedures:   Antimicrobials: rocephin , azithromycin     Subjective: Pt is nonverbal at baseline   Objective: Vitals:   09/01/24 0400 09/01/24 0441 09/01/24 0850 09/01/24 0902  BP: (!) 100/53  112/72   Pulse: 95 (!) 111 (!) 113   Resp: 17 17  17   Temp: 98 F (36.7 C)     TempSrc:      SpO2: 99% 96% 100%   Weight:      Height:        Intake/Output Summary (Last 24 hours) at 09/01/2024 0922 Last data filed at 09/01/2024 0058 Gross per 24 hour  Intake 1000 ml  Output --  Net 1000 ml   Filed Weights   08/31/24 0747  Weight: 59.5 kg    Examination:  General exam: Appears calm and comfortable  Respiratory system: course breath sounds b/l  Cardiovascular system: S1 & S2 +. No rubs, gallops or clicks. Gastrointestinal system: Abdomen is  nondistended, soft and nontender. Hypoactive bowel sounds heard. Central nervous system: Awake.  Psychiatry: Judgement and insight appears poor     Data Reviewed: I have personally reviewed following labs and imaging studies  CBC: Recent Labs  Lab 08/31/24 0836  WBC 11.3*  NEUTROABS 9.7*  HGB 14.4  HCT 47.1  MCV 92.7  PLT 339   Basic Metabolic Panel: Recent Labs  Lab 08/31/24 0836 09/01/24 0527  NA 140 143  K 3.6 3.6  CL 91* 96*  CO2 34* 38*  GLUCOSE 128* 77  BUN 13 11  CREATININE <0.30* <0.30*  CALCIUM 9.2 8.8*   GFR: CrCl cannot be calculated (This lab value cannot be used to calculate CrCl because it is not a number: <0.30). Liver Function Tests: Recent Labs  Lab 08/31/24 0836  AST 48*  ALT 73*  ALKPHOS 66  BILITOT 0.7  PROT 7.6  ALBUMIN 3.7   No results for input(s): LIPASE, AMYLASE in the last 168 hours. No results for input(s): AMMONIA in the last 168 hours. Coagulation Profile: Recent Labs  Lab 08/31/24 0836  INR 1.0   Cardiac Enzymes: No results for input(s): CKTOTAL, CKMB, CKMBINDEX, TROPONINI in the last 168 hours. BNP (last 3 results) No results for input(s): PROBNP in the last 8760 hours. HbA1C: No results for input(s): HGBA1C in the last 72 hours. CBG: No results for input(s): GLUCAP in the last 168  hours. Lipid Profile: No results for input(s): CHOL, HDL, LDLCALC, TRIG, CHOLHDL, LDLDIRECT in the last 72 hours. Thyroid Function Tests: No results for input(s): TSH, T4TOTAL, FREET4, T3FREE, THYROIDAB in the last 72 hours. Anemia Panel: No results for input(s): VITAMINB12, FOLATE, FERRITIN, TIBC, IRON, RETICCTPCT in the last 72 hours. Sepsis Labs: Recent Labs  Lab 08/31/24 0836  LATICACIDVEN 0.8    Recent Results (from the past 240 hours)  Resp panel by RT-PCR (RSV, Flu A&B, Covid) Anterior Nasal Swab     Status: None   Collection Time: 08/31/24  7:52 AM   Specimen: Anterior  Nasal Swab  Result Value Ref Range Status   SARS Coronavirus 2 by RT PCR NEGATIVE NEGATIVE Final    Comment: (NOTE) SARS-CoV-2 target nucleic acids are NOT DETECTED.  The SARS-CoV-2 RNA is generally detectable in upper respiratory specimens during the acute phase of infection. The lowest concentration of SARS-CoV-2 viral copies this assay can detect is 138 copies/mL. A negative result does not preclude SARS-Cov-2 infection and should not be used as the sole basis for treatment or other patient management decisions. A negative result may occur with  improper specimen collection/handling, submission of specimen other than nasopharyngeal swab, presence of viral mutation(s) within the areas targeted by this assay, and inadequate number of viral copies(<138 copies/mL). A negative result must be combined with clinical observations, patient history, and epidemiological information. The expected result is Negative.  Fact Sheet for Patients:  BloggerCourse.com  Fact Sheet for Healthcare Providers:  SeriousBroker.it  This test is no t yet approved or cleared by the [place]  FDA and  has been authorized for detection and/or diagnosis of SARS-CoV-2 by FDA under an Emergency Use Authorization (EUA). This EUA will remain  in effect (meaning this test can be used) for the duration of the COVID-19 declaration under Section 564(b)(1) of the Act, 21 U.S.C.section 360bbb-3(b)(1), unless the authorization is terminated  or revoked sooner.       Influenza A by PCR NEGATIVE NEGATIVE Final   Influenza B by PCR NEGATIVE NEGATIVE Final    Comment: (NOTE) The Xpert Xpress SARS-CoV-2/FLU/RSV plus assay is intended as an aid in the diagnosis of influenza from Nasopharyngeal swab specimens and should not be used as a sole basis for treatment. Nasal washings and aspirates are unacceptable for Xpert Xpress SARS-CoV-2/FLU/RSV testing.  Fact Sheet for  Patients: BloggerCourse.com  Fact Sheet for Healthcare Providers: SeriousBroker.it  This test is not yet approved or cleared by the [place]  FDA and has been authorized for detection and/or diagnosis of SARS-CoV-2 by FDA under an Emergency Use Authorization (EUA). This EUA will remain in effect (meaning this test can be used) for the duration of the COVID-19 declaration under Section 564(b)(1) of the Act, 21 U.S.C. section 360bbb-3(b)(1), unless the authorization is terminated or revoked.     Resp Syncytial Virus by PCR NEGATIVE NEGATIVE Final    Comment: (NOTE) Fact Sheet for Patients: BloggerCourse.com  Fact Sheet for Healthcare Providers: SeriousBroker.it  This test is not yet approved or cleared by the [place]  FDA and has been authorized for detection and/or diagnosis of SARS-CoV-2 by FDA under an Emergency Use Authorization (EUA). This EUA will remain in effect (meaning this test can be used) for the duration of the COVID-19 declaration under Section 564(b)(1) of the Act, 21 U.S.C. section 360bbb-3(b)(1), unless the authorization is terminated or revoked.  Performed at [facility], [address], [place], [place] [address]   Blood Culture (routine x 2)  Status: None (Preliminary result)   Collection Time: 08/31/24  7:53 AM   Specimen: BLOOD RIGHT HAND  Result Value Ref Range Status   Specimen Description BLOOD RIGHT HAND  Final   Special Requests   Final    BOTTLES DRAWN AEROBIC ONLY Blood Culture results may not be optimal due to an inadequate volume of blood received in culture bottles   Culture   Final    NO GROWTH < 24 HOURS Performed at [facility], [address] [place], [place] [address]    Report Status PENDING  Incomplete  Blood Culture (routine x 2)     Status: None (Preliminary result)   Collection Time: 08/31/24  8:35  AM   Specimen: BLOOD  Result Value Ref Range Status   Specimen Description BLOOD BLOOD RIGHT FOREARM  Final   Special Requests   Final    BOTTLES DRAWN AEROBIC AND ANAEROBIC Blood Culture results may not be optimal due to an inadequate volume of blood received in culture bottles   Culture   Final    NO GROWTH < 24 HOURS Performed at [facility], [address] [place], [place] [address]    Report Status PENDING  Incomplete  Respiratory (~20 pathogens) panel by PCR     Status: None   Collection Time: 08/31/24  1:56 PM   Specimen: Nasopharyngeal Swab; Respiratory  Result Value Ref Range Status   Adenovirus NOT DETECTED NOT DETECTED Final   Coronavirus 229E NOT DETECTED NOT DETECTED Final    Comment: (NOTE) The Coronavirus on the Respiratory Panel, DOES NOT test for the novel  Coronavirus (2019 nCoV)    Coronavirus HKU1 NOT DETECTED NOT DETECTED Final   Coronavirus NL63 NOT DETECTED NOT DETECTED Final   Coronavirus OC43 NOT DETECTED NOT DETECTED Final   Metapneumovirus NOT DETECTED NOT DETECTED Final   Rhinovirus / Enterovirus NOT DETECTED NOT DETECTED Final   Influenza A NOT DETECTED NOT DETECTED Final   Influenza B NOT DETECTED NOT DETECTED Final   Parainfluenza Virus 1 NOT DETECTED NOT DETECTED Final   Parainfluenza Virus 2 NOT DETECTED NOT DETECTED Final   Parainfluenza Virus 3 NOT DETECTED NOT DETECTED Final   Parainfluenza Virus 4 NOT DETECTED NOT DETECTED Final   Respiratory Syncytial Virus NOT DETECTED NOT DETECTED Final   Bordetella pertussis NOT DETECTED NOT DETECTED Final   Bordetella Parapertussis NOT DETECTED NOT DETECTED Final   Chlamydophila pneumoniae NOT DETECTED NOT DETECTED Final   Mycoplasma pneumoniae NOT DETECTED NOT DETECTED Final    Comment: Performed at [facility], [address] [place], [place] [address]         Radiology Studies: CT Angio Chest PE W and/or Wo Contrast Result Date: 08/31/2024 CLINICAL DATA:  Difficulty  breathing. History of muscular dystrophy. Clinical concern for pulmonary embolus. EXAM: CT ANGIOGRAPHY CHEST WITH CONTRAST TECHNIQUE: Multidetector CT imaging of the chest was performed using the standard protocol during bolus administration of intravenous contrast. Multiplanar CT image reconstructions and MIPs were obtained to evaluate the vascular anatomy. RADIATION DOSE REDUCTION: This exam was performed according to the departmental dose-optimization program which includes automated exposure control, adjustment of the mA and/or kV according to patient size and/or use of iterative reconstruction technique. CONTRAST:  75mL OMNIPAQUE  IOHEXOL  350 MG/ML SOLN COMPARISON:  11/19/2022 FINDINGS: Cardiovascular: The heart size is normal. No substantial pericardial effusion. No thoracic aortic aneurysm. No substantial atherosclerotic calcification of the thoracic aorta. No CT evidence for acute pulmonary embolus. Assessment of subsegmental and  segmental pulmonary arteries to the right lung is degraded by beam hardening artifact from spinal hardware. Mediastinum/Nodes: No mediastinal lymphadenopathy. There is no hilar lymphadenopathy. The esophagus has normal imaging features. There is no axillary lymphadenopathy. Lungs/Pleura: Right lower lobe collapse/consolidation is new in the interval with small to moderate right pleural effusion, also new since prior. There is some minimal atelectasis at the left base. Probable trace left pleural effusion. Upper Abdomen: Visualized portion of the upper abdomen shows no acute findings. Musculoskeletal: Thoracolumbar scoliosis with extensive thoracolumbar fusion hardware evident. Bones are diffusely demineralized. Review of the MIP images confirms the above findings. IMPRESSION: 1. No CT evidence for acute pulmonary embolus. Assessment of subsegmental and segmental pulmonary arteries to the right lung is degraded by beam hardening artifact from spinal hardware. 2. Right lower lobe  collapse/consolidation with small to moderate right pleural effusion, new in the interval. 3. Probable trace left pleural effusion with minimal atelectasis at the left base. Electronically Signed   By: Camellia Candle M.D.   On: 08/31/2024 11:15   DG Chest Port 1 View Result Date: 08/31/2024 CLINICAL DATA:  Sepsis.  Muscular dystrophy. EXAM: PORTABLE CHEST 1 VIEW COMPARISON:  None Available. FINDINGS: Low volume film. Cardiopericardial silhouette is at upper limits of normal for size. Vascular congestion without overt edema. No pneumothorax. Basilar atelectasis with probable small right pleural effusion. Extensive thoracolumbar fusion hardware evident. Telemetry leads overlie the chest. IMPRESSION: Low volume film with vascular congestion and basilar atelectasis. Electronically Signed   By: Camellia Candle M.D.   On: 08/31/2024 08:50        Scheduled Meds:  carbamazepine   150 mg Per Tube BID   enoxaparin  (LOVENOX ) injection  40 mg Subcutaneous Q24H   feeding supplement (PEDIASURE 1.5)  237 mL Per Tube TID WC   ipratropium-albuterol   3 mL Nebulization Q6H   metoprolol  tartrate  50 mg Per Tube BID   Continuous Infusions:  sodium chloride  Stopped (09/01/24 0058)   azithromycin      cefTRIAXone  (ROCEPHIN )  IV       LOS: 1 day       Anthony CHRISTELLA Pouch, MD [facility] Pager [phone]  If 7PM-7AM, please contact night-coverage [email or web address] 09/01/2024, 9:22 AM

## 2024-09-01 NOTE — ED Notes (Addendum)
 This RN called dietary to request patient's feeding supplement (pediasure 1.5). Per dietary, a whole case had been delivered to them but is now missing. Dietary requested that this RN make sure it hadn't been brought up. This RN checked ED fridge & CPOD fridge, as well as cabinets in ED nutrition area. No pediasure found in the ED by this RN. Dietary notified that feeding supplement was not found on the unit.

## 2024-09-01 NOTE — Consult Note (Addendum)
 PHARMACIST - PHYSICIAN COMMUNICATION  CONCERNING:  Prednisolone  Home Dose   MD contacted pharmacy to confirm home medication. Spoke with the patient's mother regarding Prednisolone  home medication since it is not in the dispense history, however patient does have 2 separate charts marked for merge that does show Prednisolone  15mg /9ml: Give 3ml (9mg ) per feeding tube once daily  Patient uses [facility] [phone]. Spoke with Rph Robin and confirmed patient is taking Prednisolone  15mg /43ml: Give 3ml (9mg ) per feeding tube once daily. This has been ordered.   Annabella LOISE Banks, PharmD Clinical Pharmacist 09/01/2024 3:42 PM

## 2024-09-02 DIAGNOSIS — J189 Pneumonia, unspecified organism: Secondary | ICD-10-CM | POA: Diagnosis not present

## 2024-09-02 LAB — CBC
HCT: 42.2 % (ref 39.0–52.0)
Hemoglobin: 12.7 g/dL — ABNORMAL LOW (ref 13.0–17.0)
MCH: 28.9 pg (ref 26.0–34.0)
MCHC: 30.1 g/dL (ref 30.0–36.0)
MCV: 95.9 fL (ref 80.0–100.0)
Platelets: 268 K/uL (ref 150–400)
RBC: 4.4 MIL/uL (ref 4.22–5.81)
RDW: 14.6 % (ref 11.5–15.5)
WBC: 8 K/uL (ref 4.0–10.5)
nRBC: 0 % (ref 0.0–0.2)

## 2024-09-02 LAB — BASIC METABOLIC PANEL WITH GFR
Anion gap: 14 (ref 5–15)
BUN: 8 mg/dL (ref 6–20)
CO2: 40 mmol/L — ABNORMAL HIGH (ref 22–32)
Calcium: 8.9 mg/dL (ref 8.9–10.3)
Chloride: 90 mmol/L — ABNORMAL LOW (ref 98–111)
Creatinine, Ser: <0.3 mg/dL — ABNORMAL LOW (ref 0.61–1.24)
Glucose, Bld: 83 mg/dL (ref 70–99)
Potassium: 4.4 mmol/L (ref 3.5–5.1)
Sodium: 144 mmol/L (ref 135–145)

## 2024-09-02 LAB — PROCALCITONIN: Procalcitonin: <0.1 ng/mL

## 2024-09-02 MED ORDER — ORAL CARE MOUTH RINSE: 15.0000 mL | OROMUCOSAL | Status: DC | PRN

## 2024-09-02 MED ORDER — ORAL CARE MOUTH RINSE
15.0000 mL | Freq: Two times a day (BID) | OROMUCOSAL | Status: DC
  Administered 2024-09-02 – 2024-09-03 (×2): 15 mL via OROMUCOSAL

## 2024-09-02 MED ORDER — MUPIROCIN 2 % EX OINT
TOPICAL_OINTMENT | Freq: Two times a day (BID) | CUTANEOUS | Status: DC
  Filled 2024-09-02: qty 22

## 2024-09-02 MED ORDER — NON FORMULARY: 237.0000 mL | Freq: Every day | Status: DC

## 2024-09-02 MED ORDER — FREE WATER
100.0000 mL | Freq: Every day | Status: DC
  Administered 2024-09-02 – 2024-09-03 (×6): 100 mL

## 2024-09-02 MED ORDER — PEDIASURE GROW & GAIN PO LIQD
237.0000 mL | Freq: Every day | ORAL | Status: DC
  Administered 2024-09-02 – 2024-09-03 (×3): 237 mL
  Filled 2024-09-02 (×8): qty 237

## 2024-09-02 NOTE — Progress Notes (Signed)
 Initial Nutrition Assessment  DOCUMENTATION CODES:   Not applicable  INTERVENTION:   -TF via PEG:  237 ml Pediasure Grow and Gain 5 times daily  50 ml free water  flush before and after each feeding administration  Tube feeding regimen provides 1100 kcals, 30 grams of protein.   NUTRITION DIAGNOSIS:   Inadequate oral intake related to inability to eat, dysphagia as evidenced by NPO status.  GOAL:   Patient will meet greater than or equal to 90% of their needs  MONITOR:   TF tolerance  REASON FOR ASSESSMENT:   Consult Enteral/tube feeding initiation and management  ASSESSMENT:   Pt with medical history significant of cerebral palsy, nonverbal at baseline, dysphagia on PEG tube feeding, brought in by family member for evaluation of fever and cough.  Pt admitted with sepsis and CAP.   Reviewed I/O's: -700 ml x 24 hours and +300 ml since admission  UOP: 700 ml x 24 hours  Pt familiar to this RD from multiple prior admissions. Pt is NPO and PEG dependent secondary to dysphagia and cerebral policy. Pt's home regimen is 237 ml Pediasure Grow and Gain 5 times daily with 50 ml free water  flush before and after each feeding. Pt mom requests that pt be on this specific formula as he has tried multiple other formulas in the past that he does not tolerate well. Mom does not accept any formulary substitutions.   Case discussed with RN, who was providing bolus feeding at time of visit. No family at bedside. RN confirmed home TF formula of Pediasure Grow and Gain; family brought in a small home supply (noted Pediasure Peptide 1.5 ordered in ED). RD adjusted orders to reflect home TF orders.   No wt history available to assess at this time.   Medications reviewed and include lovenox , miralax , and rocephin .   Labs reviewed: CBGS: 108 (inpatient orders for glycemic control are none).    NUTRITION - FOCUSED PHYSICAL EXAM:  Flowsheet Row Most Recent Value  Orbital Region No  depletion  Upper Arm Region No depletion  Thoracic and Lumbar Region No depletion  Buccal Region No depletion  Temple Region No depletion  Clavicle Bone Region No depletion  Clavicle and Acromion Bone Region No depletion  Scapular Bone Region No depletion  Dorsal Hand No depletion  Patellar Region No depletion  Anterior Thigh Region No depletion  Posterior Calf Region No depletion  Edema (RD Assessment) None  Hair Reviewed  Eyes Reviewed  Mouth Reviewed  Skin Reviewed  Nails Reviewed    Diet Order:   Diet Order             Diet NPO time specified  Diet effective now                   EDUCATION NEEDS:   Not appropriate for education at this time  Skin:  Skin Assessment: Reviewed RN Assessment  Last BM:  09/01/24 (type 6)  Height:   Ht Readings from Last 1 Encounters:  08/31/24 5' 4 (1.626 m)    Weight:   Wt Readings from Last 1 Encounters:  08/31/24 59.5 kg    Ideal Body Weight:  59.1 kg  BMI:  Body mass index is 22.52 kg/m.  Estimated Nutritional Needs:   Kcal:  1300-1500  Protein:  70-85 grams  Fluid:  1.3-1.5 L    Margery ORN, RD, LDN, CDCES Registered Dietitian III Certified Diabetes Care and Education Specialist If unable to reach this RD, please use RD  Inpatient group chat on secure chat between hours of 8am-4 pm daily

## 2024-09-02 NOTE — Progress Notes (Signed)
 PROGRESS NOTE    Lucas [name]  FMW:[id number] DOB: Nov 11, 2001 DOA: 08/31/2024 PCP: Diedra Lame, MD   Assessment & Plan:   Principal Problem:   PNA (pneumonia) Active Problems:   Sepsis (HCC)   CAP (community acquired pneumonia)  Assessment and Plan: Sepsis: met criteria w/ tachycardia, leukocytosis, & CAP. Continue on IV rocephin , azithromycin , bronchodilators. Sepsis resolved  CAP: continue on IV rocephin , azithromycin , bronchodilators. WBC is WNL today   Hx of muscular dystrophy: continue on home dose of prednisolone . No hx of cerebral palsy as per pt's mother. Nonverbal at baseline   Chronic dysphagia: continue on tube feeds. NPO at baseline as per pt's mother       DVT prophylaxis: lovenox   Code Status: full  Family Communication: discussed pt's care w/ pt's mother and answered her questions Disposition Plan: likely d/c back home  Level of care: Progressive  Status is: Inpatient Remains inpatient appropriate because: severity of illness, requiring IV abxs     Consultants:    Procedures:   Antimicrobials: rocephin , azithromycin     Subjective: Pt is nonverbal at baseline   Objective: Vitals:   09/02/24 0053 09/02/24 0444 09/02/24 0830 09/02/24 0842  BP: 120/87 123/86 125/71   Pulse: (!) 103  86 (!) 124  Resp: 18 20    Temp: (!) 97.4 F (36.3 C) 98.8 F (37.1 C)  98.3 F (36.8 C)  TempSrc:      SpO2: 92% 98%  100%  Weight:      Height:        Intake/Output Summary (Last 24 hours) at 09/02/2024 0915 Last data filed at 09/02/2024 0100 Gross per 24 hour  Intake --  Output 700 ml  Net -700 ml   Filed Weights   08/31/24 0747  Weight: 59.5 kg    Examination:  General exam: appears comfortable  Respiratory system: decreased breath sounds b/l  Cardiovascular system: S1/S2+. No rubs or clicks  Gastrointestinal system: abd is soft, NT, ND, hypoactive bowel sounds Central nervous system: awake Psychiatry: judgement and insight appears poor      Data Reviewed: I have personally reviewed following labs and imaging studies  CBC: Recent Labs  Lab 08/31/24 0836 09/02/24 0341  WBC 11.3* 8.0  NEUTROABS 9.7*  --   HGB 14.4 12.7*  HCT 47.1 42.2  MCV 92.7 95.9  PLT 339 268   Basic Metabolic Panel: Recent Labs  Lab 08/31/24 0836 09/01/24 0527 09/02/24 0341  NA 140 143 144  K 3.6 3.6 4.4  CL 91* 96* 90*  CO2 34* 38* 40*  GLUCOSE 128* 77 83  BUN 13 11 8   CREATININE <0.30* <0.30* <0.30*  CALCIUM 9.2 8.8* 8.9   GFR: CrCl cannot be calculated (This lab value cannot be used to calculate CrCl because it is not a number: <0.30). Liver Function Tests: Recent Labs  Lab 08/31/24 0836  AST 48*  ALT 73*  ALKPHOS 66  BILITOT 0.7  PROT 7.6  ALBUMIN 3.7   No results for input(s): LIPASE, AMYLASE in the last 168 hours. No results for input(s): AMMONIA in the last 168 hours. Coagulation Profile: Recent Labs  Lab 08/31/24 0836  INR 1.0   Cardiac Enzymes: No results for input(s): CKTOTAL, CKMB, CKMBINDEX, TROPONINI in the last 168 hours. BNP (last 3 results) No results for input(s): PROBNP in the last 8760 hours. HbA1C: No results for input(s): HGBA1C in the last 72 hours. CBG: Recent Labs  Lab 09/01/24 1708  GLUCAP 108*   Lipid Profile: No results for  input(s): CHOL, HDL, LDLCALC, TRIG, CHOLHDL, LDLDIRECT in the last 72 hours. Thyroid Function Tests: No results for input(s): TSH, T4TOTAL, FREET4, T3FREE, THYROIDAB in the last 72 hours. Anemia Panel: No results for input(s): VITAMINB12, FOLATE, FERRITIN, TIBC, IRON, RETICCTPCT in the last 72 hours. Sepsis Labs: Recent Labs  Lab 08/31/24 0836 09/02/24 0341  PROCALCITON  --  <0.10  LATICACIDVEN 0.8  --     Recent Results (from the past 240 hours)  Resp panel by RT-PCR (RSV, Flu A&B, Covid) Anterior Nasal Swab     Status: None   Collection Time: 08/31/24  7:52 AM   Specimen: Anterior Nasal Swab   Result Value Ref Range Status   SARS Coronavirus 2 by RT PCR NEGATIVE NEGATIVE Final    Comment: (NOTE) SARS-CoV-2 target nucleic acids are NOT DETECTED.  The SARS-CoV-2 RNA is generally detectable in upper respiratory specimens during the acute phase of infection. The lowest concentration of SARS-CoV-2 viral copies this assay can detect is 138 copies/mL. A negative result does not preclude SARS-Cov-2 infection and should not be used as the sole basis for treatment or other patient management decisions. A negative result may occur with  improper specimen collection/handling, submission of specimen other than nasopharyngeal swab, presence of viral mutation(s) within the areas targeted by this assay, and inadequate number of viral copies(<138 copies/mL). A negative result must be combined with clinical observations, patient history, and epidemiological information. The expected result is Negative.  Fact Sheet for Patients:  BloggerCourse.com  Fact Sheet for Healthcare Providers:  SeriousBroker.it  This test is no t yet approved or cleared by the [place]  FDA and  has been authorized for detection and/or diagnosis of SARS-CoV-2 by FDA under an Emergency Use Authorization (EUA). This EUA will remain  in effect (meaning this test can be used) for the duration of the COVID-19 declaration under Section 564(b)(1) of the Act, 21 U.S.C.section 360bbb-3(b)(1), unless the authorization is terminated  or revoked sooner.       Influenza A by PCR NEGATIVE NEGATIVE Final   Influenza B by PCR NEGATIVE NEGATIVE Final    Comment: (NOTE) The Xpert Xpress SARS-CoV-2/FLU/RSV plus assay is intended as an aid in the diagnosis of influenza from Nasopharyngeal swab specimens and should not be used as a sole basis for treatment. Nasal washings and aspirates are unacceptable for Xpert Xpress SARS-CoV-2/FLU/RSV testing.  Fact Sheet for  Patients: BloggerCourse.com  Fact Sheet for Healthcare Providers: SeriousBroker.it  This test is not yet approved or cleared by the [place]  FDA and has been authorized for detection and/or diagnosis of SARS-CoV-2 by FDA under an Emergency Use Authorization (EUA). This EUA will remain in effect (meaning this test can be used) for the duration of the COVID-19 declaration under Section 564(b)(1) of the Act, 21 U.S.C. section 360bbb-3(b)(1), unless the authorization is terminated or revoked.     Resp Syncytial Virus by PCR NEGATIVE NEGATIVE Final    Comment: (NOTE) Fact Sheet for Patients: BloggerCourse.com  Fact Sheet for Healthcare Providers: SeriousBroker.it  This test is not yet approved or cleared by the [place]  FDA and has been authorized for detection and/or diagnosis of SARS-CoV-2 by FDA under an Emergency Use Authorization (EUA). This EUA will remain in effect (meaning this test can be used) for the duration of the COVID-19 declaration under Section 564(b)(1) of the Act, 21 U.S.C. section 360bbb-3(b)(1), unless the authorization is terminated or revoked.  Performed at [facility], [address] [place], [place] [address]   Blood  Culture (routine x 2)     Status: None (Preliminary result)   Collection Time: 08/31/24  7:53 AM   Specimen: BLOOD RIGHT HAND  Result Value Ref Range Status   Specimen Description BLOOD RIGHT HAND  Final   Special Requests   Final    BOTTLES DRAWN AEROBIC ONLY Blood Culture results may not be optimal due to an inadequate volume of blood received in culture bottles   Culture   Final    NO GROWTH 2 DAYS Performed at [facility], [address] [place], [place] [address]    Report Status PENDING  Incomplete  Blood Culture (routine x 2)     Status: None (Preliminary result)   Collection Time: 08/31/24  8:35 AM    Specimen: BLOOD  Result Value Ref Range Status   Specimen Description BLOOD BLOOD RIGHT FOREARM  Final   Special Requests   Final    BOTTLES DRAWN AEROBIC AND ANAEROBIC Blood Culture results may not be optimal due to an inadequate volume of blood received in culture bottles   Culture   Final    NO GROWTH 2 DAYS Performed at [facility], [address] [place], [place] [address]    Report Status PENDING  Incomplete  Respiratory (~20 pathogens) panel by PCR     Status: None   Collection Time: 08/31/24  1:56 PM   Specimen: Nasopharyngeal Swab; Respiratory  Result Value Ref Range Status   Adenovirus NOT DETECTED NOT DETECTED Final   Coronavirus 229E NOT DETECTED NOT DETECTED Final    Comment: (NOTE) The Coronavirus on the Respiratory Panel, DOES NOT test for the novel  Coronavirus (2019 nCoV)    Coronavirus HKU1 NOT DETECTED NOT DETECTED Final   Coronavirus NL63 NOT DETECTED NOT DETECTED Final   Coronavirus OC43 NOT DETECTED NOT DETECTED Final   Metapneumovirus NOT DETECTED NOT DETECTED Final   Rhinovirus / Enterovirus NOT DETECTED NOT DETECTED Final   Influenza A NOT DETECTED NOT DETECTED Final   Influenza B NOT DETECTED NOT DETECTED Final   Parainfluenza Virus 1 NOT DETECTED NOT DETECTED Final   Parainfluenza Virus 2 NOT DETECTED NOT DETECTED Final   Parainfluenza Virus 3 NOT DETECTED NOT DETECTED Final   Parainfluenza Virus 4 NOT DETECTED NOT DETECTED Final   Respiratory Syncytial Virus NOT DETECTED NOT DETECTED Final   Bordetella pertussis NOT DETECTED NOT DETECTED Final   Bordetella Parapertussis NOT DETECTED NOT DETECTED Final   Chlamydophila pneumoniae NOT DETECTED NOT DETECTED Final   Mycoplasma pneumoniae NOT DETECTED NOT DETECTED Final    Comment: Performed at [facility], [address] [place], [place] [address]         Radiology Studies: CT Angio Chest PE W and/or Wo Contrast Result Date: 08/31/2024 CLINICAL DATA:  Difficulty breathing.  History of muscular dystrophy. Clinical concern for pulmonary embolus. EXAM: CT ANGIOGRAPHY CHEST WITH CONTRAST TECHNIQUE: Multidetector CT imaging of the chest was performed using the standard protocol during bolus administration of intravenous contrast. Multiplanar CT image reconstructions and MIPs were obtained to evaluate the vascular anatomy. RADIATION DOSE REDUCTION: This exam was performed according to the departmental dose-optimization program which includes automated exposure control, adjustment of the mA and/or kV according to patient size and/or use of iterative reconstruction technique. CONTRAST:  75mL OMNIPAQUE  IOHEXOL  350 MG/ML SOLN COMPARISON:  11/19/2022 FINDINGS: Cardiovascular: The heart size is normal. No substantial pericardial effusion. No thoracic aortic aneurysm. No substantial atherosclerotic calcification of the thoracic aorta. No CT evidence for acute  pulmonary embolus. Assessment of subsegmental and segmental pulmonary arteries to the right lung is degraded by beam hardening artifact from spinal hardware. Mediastinum/Nodes: No mediastinal lymphadenopathy. There is no hilar lymphadenopathy. The esophagus has normal imaging features. There is no axillary lymphadenopathy. Lungs/Pleura: Right lower lobe collapse/consolidation is new in the interval with small to moderate right pleural effusion, also new since prior. There is some minimal atelectasis at the left base. Probable trace left pleural effusion. Upper Abdomen: Visualized portion of the upper abdomen shows no acute findings. Musculoskeletal: Thoracolumbar scoliosis with extensive thoracolumbar fusion hardware evident. Bones are diffusely demineralized. Review of the MIP images confirms the above findings. IMPRESSION: 1. No CT evidence for acute pulmonary embolus. Assessment of subsegmental and segmental pulmonary arteries to the right lung is degraded by beam hardening artifact from spinal hardware. 2. Right lower lobe  collapse/consolidation with small to moderate right pleural effusion, new in the interval. 3. Probable trace left pleural effusion with minimal atelectasis at the left base. Electronically Signed   By: Camellia Candle M.D.   On: 08/31/2024 11:15        Scheduled Meds:  carbamazepine   150 mg Per Tube BID   enoxaparin  (LOVENOX ) injection  40 mg Subcutaneous Q24H   free water   100 mL Per Tube 5 X Daily   ipratropium-albuterol   3 mL Nebulization Q6H   metoprolol  tartrate  50 mg Per Tube BID   PediaSure Grow & Gain  237 mL Per Tube 5 X Daily   polyethylene glycol  17 g Per Tube Daily   prednisoLONE   9 mg Per Tube Daily   Continuous Infusions:  azithromycin  500 mg (09/02/24 0827)   cefTRIAXone  (ROCEPHIN )  IV Stopped (09/01/24 1044)     LOS: 2 days       Anthony CHRISTELLA Pouch, MD [facility] Pager [phone]  If 7PM-7AM, please contact night-coverage [email or web address] 09/02/2024, 9:15 AM

## 2024-09-02 NOTE — Consult Note (Addendum)
 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  If bleeding cover with a small piece of silver hydrofiber ([employer]qu[employer] Lawson #[id number]) before applying silicone foam.  Try to keep pressure off area as much as possible however patient appears to wear CPAP during the day when sleeping as well.    POC discussed with bedside nurse.  WOC team will not follow. Re-consult if further needs arise.   Thank you,    Powell Bar MSN, RN-BC, [employer]

## 2024-09-02 NOTE — TOC CM/SW Note (Signed)
 Transition of Care [facility]) - Inpatient Brief Assessment   Patient Details  Name: Lucas [name] MRN: [id number] Date of Birth: 15-Oct-2001  Transition of Care [facility] CM/SW Contact:    Alfonso Rummer, LCSW Phone Number: 09/02/2024, 5:40 PM   Clinical Narrative:  TOC chart review completed no identified toc needs.   Transition of Care Asessment:

## 2024-09-03 DIAGNOSIS — J189 Pneumonia, unspecified organism: Secondary | ICD-10-CM | POA: Diagnosis not present

## 2024-09-03 LAB — CBC
HCT: 40.4 % (ref 39.0–52.0)
Hemoglobin: 12.3 g/dL — ABNORMAL LOW (ref 13.0–17.0)
MCH: 28.7 pg (ref 26.0–34.0)
MCHC: 30.4 g/dL (ref 30.0–36.0)
MCV: 94.4 fL (ref 80.0–100.0)
Platelets: 248 K/uL (ref 150–400)
RBC: 4.28 MIL/uL (ref 4.22–5.81)
RDW: 14.4 % (ref 11.5–15.5)
WBC: 7.1 K/uL (ref 4.0–10.5)
nRBC: 0 % (ref 0.0–0.2)

## 2024-09-03 LAB — BASIC METABOLIC PANEL WITH GFR
Anion gap: 12 (ref 5–15)
BUN: 8 mg/dL (ref 6–20)
CO2: 39 mmol/L — ABNORMAL HIGH (ref 22–32)
Calcium: 8.9 mg/dL (ref 8.9–10.3)
Chloride: 88 mmol/L — ABNORMAL LOW (ref 98–111)
Creatinine, Ser: <0.3 mg/dL — ABNORMAL LOW (ref 0.61–1.24)
Glucose, Bld: 108 mg/dL — ABNORMAL HIGH (ref 70–99)
Potassium: 4.3 mmol/L (ref 3.5–5.1)
Sodium: 139 mmol/L (ref 135–145)

## 2024-09-03 MED ORDER — LEVALBUTEROL HCL 1.25 MG/0.5ML IN NEBU
1.2500 mg | INHALATION_SOLUTION | Freq: Three times a day (TID) | RESPIRATORY_TRACT | Status: DC
Start: 2024-09-03 — End: 2024-09-03
  Administered 2024-09-03: 1.25 mg via RESPIRATORY_TRACT
  Filled 2024-09-03: qty 0.5

## 2024-09-03 MED ORDER — AZITHROMYCIN 200 MG/5ML PO SUSR
250.0000 mg | Freq: Every day | ORAL | 0 refills | Status: AC
Start: 2024-09-03 — End: 2024-09-06

## 2024-09-03 MED ORDER — PREDNISOLONE SODIUM PHOSPHATE 15 MG/5ML PO SOLN: 9.0000 mg | Freq: Every day | ORAL | Status: DC

## 2024-09-03 MED ORDER — AMOXICILLIN-POT CLAVULANATE 875-125 MG PO TABS: 1.0000 | ORAL_TABLET | Freq: Two times a day (BID) | ORAL | 0 refills | Status: DC

## 2024-09-03 MED ORDER — CEFUROXIME AXETIL 500 MG PO TABS: 500.0000 mg | ORAL_TABLET | Freq: Two times a day (BID) | ORAL | 0 refills | Status: AC

## 2024-09-03 NOTE — Plan of Care (Signed)

## 2024-09-03 NOTE — Discharge Summary (Addendum)
 Physician Discharge Summary  Lucas [name] FMW:[id number] DOB: [birth year] DOA: 08/31/2024  PCP: Diedra Lame, MD  Admit date: 08/31/2024 Discharge date: 09/03/2024  Admitted From: home  Disposition:  home w/ home health  Recommendations for Outpatient Follow-up:  Follow up with PCP in 1-2 weeks Will need repeat CXR in 4-6 weeks to confirm resolution of pneumonia  Resume home services with [facility]:  Equipment/Devices:   Discharge Condition: stable  CODE STATUS: Full Diet recommendation: NPO   Brief/Interim Summary: HPI was taken from Dr. Laurita: Lucas [name] is a 23 y.o. male with medical history significant of cerebral palsy, nonverbal at baseline, dysphagia on PEG tube feeding, brought in by family member for evaluation of fever and cough.   Patient is nonverbal at baseline, all history given by mother at bedside.  Patient had a fever of 102 on Tuesday and started to have a cough since yesterday, the cough has been dry.   ED Course: Temperature 99, tachycardia heart rate 120-130, blood pressure 130/70 O2 saturation 100% on 6 L.  CTA showed right lower lobe pneumonia and small pleural effusion.  Patient was given ceftriaxone  and azithromycin .blood work showed WBC 11.3 hemoglobin 13, BUN 13 creatinine 0.3 glucose 128.  0 point review of systems negative.   Discharge Diagnoses:  Principal Problem:   PNA (pneumonia) Active Problems:   Sepsis (HCC)   CAP (community acquired pneumonia)  Sepsis: met criteria w/ tachycardia, leukocytosis, & CAP. Continue on IV rocephin , azithromycin , bronchodilators. Sepsis resolved. D/c home w/ cefuroxime , azithromycin  to complete the course    CAP: continue on IV rocephin , azithromycin , bronchodilators. D/c home w/ cefuroxime , azithromycin  to complete the course. WBC is WNL again today    Hx of muscular dystrophy: continue on home dose of prednisolone . No hx of cerebral palsy as per pt's mother. Nonverbal at baseline     Chronic dysphagia: continue on tube feeds. NPO at baseline as per pt's mother   Discharge Instructions  Discharge Instructions     Discharge instructions   Complete by: As directed    F/u w/ PCP in 1-2 weeks. Will need a repeat CXR in 4-6 weeks to confirm resolution of pneumonia.   Discharge wound care:   Complete by: As directed    Daily      Comments: Cleanse wound bridge of nose with NS, apply Mupirocin  ointment to wound bed 2 times daily and secure with silicone foam.  If bleeding cover wound bed with a small piece of silver hydrofiber (Aquacel ANDRIA Collum [phone]) before applying silicone foam.   Increase activity slowly   Complete by: As directed       Allergies as of 09/03/2024   Not on File      Medication List     TAKE these medications    azithromycin  200 MG/5ML suspension Commonly known as: ZITHROMAX  Place 6.3 mLs (250 mg total) into feeding tube daily for 3 days.   carbamazepine  100 MG chewable tablet Commonly known as: TEGRETOL  Chew 150 mg by mouth 2 (two) times daily.   cefUROXime  500 MG tablet Commonly known as: CEFTIN  Place 1 tablet (500 mg total) into feeding tube 2 (two) times daily with a meal for 3 days.   metoprolol  tartrate 50 MG tablet Commonly known as: LOPRESSOR  Place 50 mg into feeding tube 2 (two) times daily.   polyethylene glycol powder 17 GM/SCOOP powder Commonly known as: GLYCOLAX /MIRALAX  Take 17 g by mouth daily. Dissolve 1 capful (17g) in 4-8 ounces of liquid and  take by mouth daily.   prednisoLONE  15 MG/5ML solution Commonly known as: ORAPRED  Place 3 mLs (9 mg total) into feeding tube daily. Start taking on: September 04, 2024               Discharge Care Instructions  (From admission, onward)           Start     Ordered   09/03/24 0000  Discharge wound care:       Comments: Daily      Comments: Cleanse wound bridge of nose with NS, apply Mupirocin  ointment to wound bed 2 times daily and secure with silicone foam.   If bleeding cover wound bed with a small piece of silver hydrofiber (Aquacel ANDRIA Collum #[id number]) before applying silicone foam.   09/03/24 1243            Not on File  Consultations:    Procedures/Studies: CT Angio Chest PE W and/or Wo Contrast Result Date: 08/31/2024 CLINICAL DATA:  Difficulty breathing. History of muscular dystrophy. Clinical concern for pulmonary embolus. EXAM: CT ANGIOGRAPHY CHEST WITH CONTRAST TECHNIQUE: Multidetector CT imaging of the chest was performed using the standard protocol during bolus administration of intravenous contrast. Multiplanar CT image reconstructions and MIPs were obtained to evaluate the vascular anatomy. RADIATION DOSE REDUCTION: This exam was performed according to the departmental dose-optimization program which includes automated exposure control, adjustment of the mA and/or kV according to patient size and/or use of iterative reconstruction technique. CONTRAST:  75mL OMNIPAQUE  IOHEXOL  350 MG/ML SOLN COMPARISON:  11/19/2022 FINDINGS: Cardiovascular: The heart size is normal. No substantial pericardial effusion. No thoracic aortic aneurysm. No substantial atherosclerotic calcification of the thoracic aorta. No CT evidence for acute pulmonary embolus. Assessment of subsegmental and segmental pulmonary arteries to the right lung is degraded by beam hardening artifact from spinal hardware. Mediastinum/Nodes: No mediastinal lymphadenopathy. There is no hilar lymphadenopathy. The esophagus has normal imaging features. There is no axillary lymphadenopathy. Lungs/Pleura: Right lower lobe collapse/consolidation is new in the interval with small to moderate right pleural effusion, also new since prior. There is some minimal atelectasis at the left base. Probable trace left pleural effusion. Upper Abdomen: Visualized portion of the upper abdomen shows no acute findings. Musculoskeletal: Thoracolumbar scoliosis with extensive thoracolumbar fusion hardware evident.  Bones are diffusely demineralized. Review of the MIP images confirms the above findings. IMPRESSION: 1. No CT evidence for acute pulmonary embolus. Assessment of subsegmental and segmental pulmonary arteries to the right lung is degraded by beam hardening artifact from spinal hardware. 2. Right lower lobe collapse/consolidation with small to moderate right pleural effusion, new in the interval. 3. Probable trace left pleural effusion with minimal atelectasis at the left base. Electronically Signed   By: Camellia Candle M.D.   On: 08/31/2024 11:15   DG Chest Port 1 View Result Date: 08/31/2024 CLINICAL DATA:  Sepsis.  Muscular dystrophy. EXAM: PORTABLE CHEST 1 VIEW COMPARISON:  None Available. FINDINGS: Low volume film. Cardiopericardial silhouette is at upper limits of normal for size. Vascular congestion without overt edema. No pneumothorax. Basilar atelectasis with probable small right pleural effusion. Extensive thoracolumbar fusion hardware evident. Telemetry leads overlie the chest. IMPRESSION: Low volume film with vascular congestion and basilar atelectasis. Electronically Signed   By: Camellia Candle M.D.   On: 08/31/2024 08:50   (Echo, Carotid, EGD, Colonoscopy, ERCP)    Subjective: Pt appears comfortable    Discharge Exam: Vitals:   09/03/24 0830 09/03/24 1200  BP: (!) 146/105 104/73  Pulse: (!) 102 (!)  110  Resp: 20 20  Temp: 98.6 F (37 C) 98.4 F (36.9 C)  SpO2: 100% 97%   Vitals:   09/03/24 0306 09/03/24 0725 09/03/24 0830 09/03/24 1200  BP: (!) 128/97  (!) 146/105 104/73  Pulse: (!) 102  (!) 102 (!) 110  Resp: 18  20 20   Temp: 98.4 F (36.9 C)  98.6 F (37 C) 98.4 F (36.9 C)  TempSrc:   Oral Oral  SpO2: 97% 97% 100% 97%  Weight:      Height:        General: Pt is alert, awake, not in acute distress Cardiovascular: S1/S2 +, no rubs, no gallops Respiratory: decreased breath sounds b/l  Abdominal: Soft, NT, ND, bowel sounds + Extremities: no edema, no  cyanosis    The results of significant diagnostics from this hospitalization (including imaging, microbiology, ancillary and laboratory) are listed below for reference.     Microbiology: Recent Results (from the past 240 hours)  Resp panel by RT-PCR (RSV, Flu A&B, Covid) Anterior Nasal Swab     Status: None   Collection Time: 08/31/24  7:52 AM   Specimen: Anterior Nasal Swab  Result Value Ref Range Status   SARS Coronavirus 2 by RT PCR NEGATIVE NEGATIVE Final    Comment: (NOTE) SARS-CoV-2 target nucleic acids are NOT DETECTED.  The SARS-CoV-2 RNA is generally detectable in upper respiratory specimens during the acute phase of infection. The lowest concentration of SARS-CoV-2 viral copies this assay can detect is 138 copies/mL. A negative result does not preclude SARS-Cov-2 infection and should not be used as the sole basis for treatment or other patient management decisions. A negative result may occur with  improper specimen collection/handling, submission of specimen other than nasopharyngeal swab, presence of viral mutation(s) within the areas targeted by this assay, and inadequate number of viral copies(<138 copies/mL). A negative result must be combined with clinical observations, patient history, and epidemiological information. The expected result is Negative.  Fact Sheet for Patients:  BloggerCourse.com  Fact Sheet for Healthcare Providers:  SeriousBroker.it  This test is no t yet approved or cleared by the [place]  FDA and  has been authorized for detection and/or diagnosis of SARS-CoV-2 by FDA under an Emergency Use Authorization (EUA). This EUA will remain  in effect (meaning this test can be used) for the duration of the COVID-19 declaration under Section 564(b)(1) of the Act, 21 U.S.C.section 360bbb-3(b)(1), unless the authorization is terminated  or revoked sooner.       Influenza A by PCR NEGATIVE  NEGATIVE Final   Influenza B by PCR NEGATIVE NEGATIVE Final    Comment: (NOTE) The Xpert Xpress SARS-CoV-2/FLU/RSV plus assay is intended as an aid in the diagnosis of influenza from Nasopharyngeal swab specimens and should not be used as a sole basis for treatment. Nasal washings and aspirates are unacceptable for Xpert Xpress SARS-CoV-2/FLU/RSV testing.  Fact Sheet for Patients: BloggerCourse.com  Fact Sheet for Healthcare Providers: SeriousBroker.it  This test is not yet approved or cleared by the [place]  FDA and has been authorized for detection and/or diagnosis of SARS-CoV-2 by FDA under an Emergency Use Authorization (EUA). This EUA will remain in effect (meaning this test can be used) for the duration of the COVID-19 declaration under Section 564(b)(1) of the Act, 21 U.S.C. section 360bbb-3(b)(1), unless the authorization is terminated or revoked.     Resp Syncytial Virus by PCR NEGATIVE NEGATIVE Final    Comment: (NOTE) Fact Sheet for Patients: BloggerCourse.com  Fact Sheet for  Healthcare Providers: SeriousBroker.it  This test is not yet approved or cleared by the [place]  FDA and has been authorized for detection and/or diagnosis of SARS-CoV-2 by FDA under an Emergency Use Authorization (EUA). This EUA will remain in effect (meaning this test can be used) for the duration of the COVID-19 declaration under Section 564(b)(1) of the Act, 21 U.S.C. section 360bbb-3(b)(1), unless the authorization is terminated or revoked.  Performed at [facility], [address] [place], [place] [address]   Blood Culture (routine x 2)     Status: None (Preliminary result)   Collection Time: 08/31/24  7:53 AM   Specimen: BLOOD RIGHT HAND  Result Value Ref Range Status   Specimen Description BLOOD RIGHT HAND  Final   Special Requests   Final    BOTTLES DRAWN  AEROBIC ONLY Blood Culture results may not be optimal due to an inadequate volume of blood received in culture bottles   Culture   Final    NO GROWTH 3 DAYS Performed at [facility], [address] [place], [place] [address]    Report Status PENDING  Incomplete  Blood Culture (routine x 2)     Status: None (Preliminary result)   Collection Time: 08/31/24  8:35 AM   Specimen: BLOOD  Result Value Ref Range Status   Specimen Description BLOOD BLOOD RIGHT FOREARM  Final   Special Requests   Final    BOTTLES DRAWN AEROBIC AND ANAEROBIC Blood Culture results may not be optimal due to an inadequate volume of blood received in culture bottles   Culture   Final    NO GROWTH 3 DAYS Performed at [facility], [address] [place], [place] [address]    Report Status PENDING  Incomplete  Respiratory (~20 pathogens) panel by PCR     Status: None   Collection Time: 08/31/24  1:56 PM   Specimen: Nasopharyngeal Swab; Respiratory  Result Value Ref Range Status   Adenovirus NOT DETECTED NOT DETECTED Final   Coronavirus 229E NOT DETECTED NOT DETECTED Final    Comment: (NOTE) The Coronavirus on the Respiratory Panel, DOES NOT test for the novel  Coronavirus (2019 nCoV)    Coronavirus HKU1 NOT DETECTED NOT DETECTED Final   Coronavirus NL63 NOT DETECTED NOT DETECTED Final   Coronavirus OC43 NOT DETECTED NOT DETECTED Final   Metapneumovirus NOT DETECTED NOT DETECTED Final   Rhinovirus / Enterovirus NOT DETECTED NOT DETECTED Final   Influenza A NOT DETECTED NOT DETECTED Final   Influenza B NOT DETECTED NOT DETECTED Final   Parainfluenza Virus 1 NOT DETECTED NOT DETECTED Final   Parainfluenza Virus 2 NOT DETECTED NOT DETECTED Final   Parainfluenza Virus 3 NOT DETECTED NOT DETECTED Final   Parainfluenza Virus 4 NOT DETECTED NOT DETECTED Final   Respiratory Syncytial Virus NOT DETECTED NOT DETECTED Final   Bordetella pertussis NOT DETECTED NOT DETECTED Final   Bordetella  Parapertussis NOT DETECTED NOT DETECTED Final   Chlamydophila pneumoniae NOT DETECTED NOT DETECTED Final   Mycoplasma pneumoniae NOT DETECTED NOT DETECTED Final    Comment: Performed at [facility], [address], [place], [place] [address]     Labs: BNP (last 3 results) No results for input(s): BNP in the last 8760 hours. Basic Metabolic Panel: Recent Labs  Lab 08/31/24 0836 09/01/24 0527 09/02/24 0341 09/03/24 0343  NA 140 143 144 139  K 3.6 3.6 4.4 4.3  CL 91* 96* 90* 88*  CO2 34* 38* 40* 39*  GLUCOSE 128* 77 83 108*  BUN 13 11 8 8   CREATININE <0.30* <0.30* <0.30* <0.30*  CALCIUM 9.2 8.8* 8.9 8.9   Liver Function Tests: Recent Labs  Lab 08/31/24 0836  AST 48*  ALT 73*  ALKPHOS 66  BILITOT 0.7  PROT 7.6  ALBUMIN 3.7   No results for input(s): LIPASE, AMYLASE in the last 168 hours. No results for input(s): AMMONIA in the last 168 hours. CBC: Recent Labs  Lab 08/31/24 0836 09/02/24 0341 09/03/24 0343  WBC 11.3* 8.0 7.1  NEUTROABS 9.7*  --   --   HGB 14.4 12.7* 12.3*  HCT 47.1 42.2 40.4  MCV 92.7 95.9 94.4  PLT 339 268 248   Cardiac Enzymes: No results for input(s): CKTOTAL, CKMB, CKMBINDEX, TROPONINI in the last 168 hours. BNP: Invalid input(s): POCBNP CBG: Recent Labs  Lab 09/01/24 1708  GLUCAP 108*   D-Dimer No results for input(s): DDIMER in the last 72 hours. Hgb A1c No results for input(s): HGBA1C in the last 72 hours. Lipid Profile No results for input(s): CHOL, HDL, LDLCALC, TRIG, CHOLHDL, LDLDIRECT in the last 72 hours. Thyroid function studies No results for input(s): TSH, T4TOTAL, T3FREE, THYROIDAB in the last 72 hours.  Invalid input(s): FREET3 Anemia work up No results for input(s): VITAMINB12, FOLATE, FERRITIN, TIBC, IRON, RETICCTPCT in the last 72 hours. Urinalysis    Component Value Date/Time   COLORURINE YELLOW (A) 08/31/2024 0753   APPEARANCEUR CLOUDY (A)  08/31/2024 0753   LABSPEC 1.017 08/31/2024 0753   PHURINE 7.0 08/31/2024 0753   GLUCOSEU NEGATIVE 08/31/2024 0753   HGBUR NEGATIVE 08/31/2024 0753   BILIRUBINUR NEGATIVE 08/31/2024 0753   KETONESUR 5 (A) 08/31/2024 0753   PROTEINUR 30 (A) 08/31/2024 0753   NITRITE NEGATIVE 08/31/2024 0753   LEUKOCYTESUR NEGATIVE 08/31/2024 0753   Sepsis Labs Recent Labs  Lab 08/31/24 0836 09/02/24 0341 09/03/24 0343  WBC 11.3* 8.0 7.1   Microbiology Recent Results (from the past 240 hours)  Resp panel by RT-PCR (RSV, Flu A&B, Covid) Anterior Nasal Swab     Status: None   Collection Time: 08/31/24  7:52 AM   Specimen: Anterior Nasal Swab  Result Value Ref Range Status   SARS Coronavirus 2 by RT PCR NEGATIVE NEGATIVE Final    Comment: (NOTE) SARS-CoV-2 target nucleic acids are NOT DETECTED.  The SARS-CoV-2 RNA is generally detectable in upper respiratory specimens during the acute phase of infection. The lowest concentration of SARS-CoV-2 viral copies this assay can detect is 138 copies/mL. A negative result does not preclude SARS-Cov-2 infection and should not be used as the sole basis for treatment or other patient management decisions. A negative result may occur with  improper specimen collection/handling, submission of specimen other than nasopharyngeal swab, presence of viral mutation(s) within the areas targeted by this assay, and inadequate number of viral copies(<138 copies/mL). A negative result must be combined with clinical observations, patient history, and epidemiological information. The expected result is Negative.  Fact Sheet for Patients:  BloggerCourse.com  Fact Sheet for Healthcare Providers:  SeriousBroker.it  This test is no t yet approved or cleared by the [place]  FDA and  has been authorized for detection and/or diagnosis of SARS-CoV-2 by FDA under an Emergency Use Authorization (EUA). This EUA will remain   in effect (meaning this test can be used) for the duration of the COVID-19 declaration under Section 564(b)(1) of the Act, 21 U.S.C.section 360bbb-3(b)(1), unless the authorization is terminated  or revoked sooner.  Influenza A by PCR NEGATIVE NEGATIVE Final   Influenza B by PCR NEGATIVE NEGATIVE Final    Comment: (NOTE) The Xpert Xpress SARS-CoV-2/FLU/RSV plus assay is intended as an aid in the diagnosis of influenza from Nasopharyngeal swab specimens and should not be used as a sole basis for treatment. Nasal washings and aspirates are unacceptable for Xpert Xpress SARS-CoV-2/FLU/RSV testing.  Fact Sheet for Patients: BloggerCourse.com  Fact Sheet for Healthcare Providers: SeriousBroker.it  This test is not yet approved or cleared by the [place]  FDA and has been authorized for detection and/or diagnosis of SARS-CoV-2 by FDA under an Emergency Use Authorization (EUA). This EUA will remain in effect (meaning this test can be used) for the duration of the COVID-19 declaration under Section 564(b)(1) of the Act, 21 U.S.C. section 360bbb-3(b)(1), unless the authorization is terminated or revoked.     Resp Syncytial Virus by PCR NEGATIVE NEGATIVE Final    Comment: (NOTE) Fact Sheet for Patients: BloggerCourse.com  Fact Sheet for Healthcare Providers: SeriousBroker.it  This test is not yet approved or cleared by the [place]  FDA and has been authorized for detection and/or diagnosis of SARS-CoV-2 by FDA under an Emergency Use Authorization (EUA). This EUA will remain in effect (meaning this test can be used) for the duration of the COVID-19 declaration under Section 564(b)(1) of the Act, 21 U.S.C. section 360bbb-3(b)(1), unless the authorization is terminated or revoked.  Performed at [facility], [address] [place], [place] [address]   Blood  Culture (routine x 2)     Status: None (Preliminary result)   Collection Time: 08/31/24  7:53 AM   Specimen: BLOOD RIGHT HAND  Result Value Ref Range Status   Specimen Description BLOOD RIGHT HAND  Final   Special Requests   Final    BOTTLES DRAWN AEROBIC ONLY Blood Culture results may not be optimal due to an inadequate volume of blood received in culture bottles   Culture   Final    NO GROWTH 3 DAYS Performed at [facility], [address], [place], [place] [address]    Report Status PENDING  Incomplete  Blood Culture (routine x 2)     Status: None (Preliminary result)   Collection Time: 08/31/24  8:35 AM   Specimen: BLOOD  Result Value Ref Range Status   Specimen Description BLOOD BLOOD RIGHT FOREARM  Final   Special Requests   Final    BOTTLES DRAWN AEROBIC AND ANAEROBIC Blood Culture results may not be optimal due to an inadequate volume of blood received in culture bottles   Culture   Final    NO GROWTH 3 DAYS Performed at [facility], [address] [place], [place] [address]    Report Status PENDING  Incomplete  Respiratory (~20 pathogens) panel by PCR     Status: None   Collection Time: 08/31/24  1:56 PM   Specimen: Nasopharyngeal Swab; Respiratory  Result Value Ref Range Status   Adenovirus NOT DETECTED NOT DETECTED Final   Coronavirus 229E NOT DETECTED NOT DETECTED Final    Comment: (NOTE) The Coronavirus on the Respiratory Panel, DOES NOT test for the novel  Coronavirus (2019 nCoV)    Coronavirus HKU1 NOT DETECTED NOT DETECTED Final   Coronavirus NL63 NOT DETECTED NOT DETECTED Final   Coronavirus OC43 NOT DETECTED NOT DETECTED Final   Metapneumovirus NOT DETECTED NOT DETECTED Final   Rhinovirus / Enterovirus NOT DETECTED NOT DETECTED Final   Influenza A NOT DETECTED NOT DETECTED Final  Influenza B NOT DETECTED NOT DETECTED Final   Parainfluenza Virus 1 NOT DETECTED NOT DETECTED Final   Parainfluenza Virus 2 NOT DETECTED NOT DETECTED Final    Parainfluenza Virus 3 NOT DETECTED NOT DETECTED Final   Parainfluenza Virus 4 NOT DETECTED NOT DETECTED Final   Respiratory Syncytial Virus NOT DETECTED NOT DETECTED Final   Bordetella pertussis NOT DETECTED NOT DETECTED Final   Bordetella Parapertussis NOT DETECTED NOT DETECTED Final   Chlamydophila pneumoniae NOT DETECTED NOT DETECTED Final   Mycoplasma pneumoniae NOT DETECTED NOT DETECTED Final    Comment: Performed at [facility], [address] [place], [place] [address]     Time coordinating discharge: 36 minutes  SIGNED:   Anthony CHRISTELLA Pouch, MD  [facility] 09/03/2024, 1:05 PM Pager   If 7PM-7AM, please contact night-coverage [email or web address]

## 2024-09-03 NOTE — TOC Transition Note (Signed)
 Transition of Care [facility]) - Discharge Note   Patient Details  Name: Lucas [name] MRN: [id number] Date of Birth: Dec 12, 2001  [facility] CM/SW Contact:  Lauraine JAYSON Carpen, LCSW Phone Number: 09/03/2024, 1:50 PM   Clinical Narrative: Patient has orders to discharge home today. Patient has personal care services with [facility]. CSW called liaison [phone]) and confirmed. Faxed discharge summary to them at [phone]. Mom requested cab voucher home. Patient needs ambulance transport home. Address on facesheet is correct. Cab has picked up mom. [facility] has been arranged. No further concerns. CSW signing off.    Final next level of care: Home/Self Care (with personal care services) Barriers to Discharge: No Barriers Identified   Patient Goals and CMS Choice            Discharge Placement                Patient to be transferred to facility by: [facility] Name of family member notified: Lucas [name] Patient and family notified of of transfer: 09/03/24  Discharge Plan and Services Additional resources added to the After Visit Summary for Autism/IDD     Post Acute Care Choice: Resumption of Svcs/PTA Provider                               Social Drivers of Health (SDOH) Interventions SDOH Screenings   Food Insecurity: No Food Insecurity (09/01/2024)  Housing: Unknown (09/01/2024)  Transportation Needs: Unmet Transportation Needs (09/01/2024)  Utilities: Not At Risk (09/01/2024)  Social Connections: Unknown (09/01/2024)     Readmission Risk Interventions     No data to display

## 2024-09-04 ENCOUNTER — Ambulatory Visit: Admitting: Sleep Medicine

## 2024-09-04 ENCOUNTER — Encounter (INDEPENDENT_AMBULATORY_CARE_PROVIDER_SITE_OTHER): Payer: Self-pay | Admitting: Family

## 2024-09-05 ENCOUNTER — Ambulatory Visit (INDEPENDENT_AMBULATORY_CARE_PROVIDER_SITE_OTHER): Payer: Self-pay

## 2024-09-05 ENCOUNTER — Ambulatory Visit (INDEPENDENT_AMBULATORY_CARE_PROVIDER_SITE_OTHER): Payer: Self-pay | Admitting: Pediatrics

## 2024-09-05 LAB — CULTURE, BLOOD (ROUTINE X 2)
Culture: NO GROWTH
Culture: NO GROWTH

## 2024-09-10 ENCOUNTER — Other Ambulatory Visit: Payer: Self-pay

## 2024-09-10 MED ORDER — LEVALBUTEROL HCL 1.25 MG/3ML IN NEBU: 1.2500 mg | INHALATION_SOLUTION | RESPIRATORY_TRACT | 0 refills | Status: AC | PRN

## 2024-09-10 NOTE — Telephone Encounter (Signed)
 I approved one refill. Future refills for Xopenex  should be directed to his PCP. Jaqualyn also needs appointment with Dr Waddell. I sent message to scheduler for that.

## 2024-09-10 NOTE — Telephone Encounter (Signed)
 Copied from CRM [id number]. Topic: Clinical - Medication Question >> Sep 10, 2024 10:38 AM Devaughn RAMAN wrote: Reason for CRM: Roselie with [facility] called and stated the patient needs a order for the levalbuterol  (XOPENEX ) 1.25 MG/3ML nebulizer solution. Patient was getting the medication through a pediatric pulmonologist however he is needing it refilled through the office since he is now an adult. Roselie confirmed the correct pharmacy for the patient is [facility].

## 2024-09-11 ENCOUNTER — Telehealth (INDEPENDENT_AMBULATORY_CARE_PROVIDER_SITE_OTHER): Payer: Self-pay | Admitting: Pharmacy Technician

## 2024-09-11 ENCOUNTER — Other Ambulatory Visit (HOSPITAL_COMMUNITY): Payer: Self-pay

## 2024-09-11 NOTE — Telephone Encounter (Signed)
 Pharmacy Patient Advocate Encounter   Received notification from CoverMyMeds that prior authorization for Levalbuterol  1.25 MG/3ML nebulizer solution is required/requested.   Insurance verification completed.   The patient is insured through [facility] MEDICAID.   Per test claim: PA required and submitted KEY/EOC/Request #: [id number] APPROVED from 09/11/24 to 09/11/25. Ran test claim, Copay is $4.00. This test claim was processed through [facility]- copay amounts may vary at other pharmacies due to pharmacy/plan contracts, or as the patient moves through the different stages of their insurance plan.

## 2024-09-12 ENCOUNTER — Other Ambulatory Visit (HOSPITAL_COMMUNITY): Payer: Self-pay

## 2024-09-17 ENCOUNTER — Ambulatory Visit: Admitting: Sleep Medicine

## 2024-09-17 ENCOUNTER — Encounter: Payer: Self-pay | Admitting: Sleep Medicine

## 2024-09-17 VITALS — BP 120/70 | HR 118 | Temp 97.6°F | Ht 60.0 in | Wt 131.0 lb

## 2024-09-17 DIAGNOSIS — G7101 Duchenne or Becker muscular dystrophy: Secondary | ICD-10-CM

## 2024-09-17 DIAGNOSIS — G4733 Obstructive sleep apnea (adult) (pediatric): Secondary | ICD-10-CM | POA: Diagnosis not present

## 2024-09-17 NOTE — Patient Instructions (Signed)
 Referring to Dr. Isaiah for oxygen  and cough assist device management.

## 2024-09-17 NOTE — Progress Notes (Signed)
 Name:Lucas [name] MRN: [id number] DOB: November 30, 2001   CHIEF COMPLAINT:  PAP F/U   HISTORY OF PRESENT ILLNESS:  Lucas [name] is a 23 y.o. w/ a h/o Duchenne muscular dystrophy, intellectual disability, autism and epilepsy who presents for BIPAP F/U visit. Per caretaker and patient's mother, patient is using PAP therapy every night, which is confirmed by compliance data. He is currently using a FFM, which has causes extensive skin breakdown on nasal bridge.     PAST MEDICAL HISTORY :   has a past medical history of Autism, Autism, Community acquired pneumonia of left lower lobe of lung (03/03/2022), Diarrhea (06/09/2015), Family history of adverse reaction to anesthesia, Muscular dystrophy (HCC), Scoliosis, Seizures (HCC), Sepsis (HCC) (03/03/2022), Transient alteration of awareness, Viral gastroenteritis, and Yeast infection of the skin (10/24/2020).  has a past surgical history that includes Circumcision; Laparoscopic gastrostomy (N/A, 05/24/2017); and Spine surgery (N/A). Prior to Admission medications   Medication Sig Start Date End Date Taking? Authorizing Provider  acetaminophen  (TYLENOL ) 160 MG/5ML elixir Take 20.3 mLs (650 mg total) by mouth every 4 (four) hours as needed for pain. 12/02/22   Cyrena Mylar, MD  carbamazepine  (TEGRETOL ) 100 MG chewable tablet TAKE 1 & 1/2 TABLETS BY MOUTH TWICE A DAY 11/27/23   Waddell Corean HERO, MD  levalbuterol  (XOPENEX ) 1.25 MG/3ML nebulizer solution Take 1.25 mg by nebulization every 4 (four) hours as needed. 03/03/22   [provider]  metoprolol  tartrate (LOPRESSOR ) 50 MG tablet Place 1 tablet (50 mg total) into feeding tube 2 (two) times daily. 02/13/24   Caleen Qualia, MD  Nutritional Supplements (PEDIASURE GROW & GAIN) LIQD Drink 1 bottle spaced out 4 times a day 05/29/23   Waddell Corean HERO, MD  nystatin  (MYCOSTATIN /NYSTOP ) powder Apply 1 Application topically 3 (three) times daily as needed. 08/02/22   [provider]   polyethylene glycol powder (GOODSENSE CLEARLAX) 17 GM/SCOOP powder TAKE 17 GM BY MOUTH TWICE DAILY AS DIRECTED WITH PRUNE JUICE 10/31/23   Vanga, Rohini Kynzli Rease, MD  prednisoLONE  (ORAPRED ) 15 MG/5ML solution PLACE 3 ML INTO FEEDING TUBE DAILY 12/07/23   Waddell Corean HERO, MD   Allergies  Allergen Reactions   Lisinopril Rash    Red bubbly rash   Penicillins Hives and Rash    Has patient had a PCN reaction causing immediate rash, facial/tongue/throat swelling, SOB or lightheadedness with hypotension: Yes Has patient had a PCN reaction causing severe rash involving mucus membranes or skin necrosis: Yes Has patient had a PCN reaction that required hospitalization: No Has patient had a PCN reaction occurring within the last 10 years: No If all of the above answers are NO, then may proceed with Cephalosporin use.    Actical     Red in face, itchy, bumps   Apple Juice    Docusate Sodium      Rash and cant sleep   Metamucil [Psyllium] Other (See Comments)    Interacted with seizure medicine   Multivitamins     Red in face, itchy, bumps   Other Other (See Comments)    Senkot -- gets rash and cant sleep   Aloe Vera Rash    Mom report red little dots after applying onto face   Calcitonin Rash   Esomeprazole Magnesium  Nausea And Vomiting   Omeprazole Other (See Comments)    Constipation and possible rash after drug was stopped   Simethicone  Palpitations and Rash   Sunflower Oil Itching, Rash and Other (See Comments)    GI upset -  Avoid products containing this additive   Vitamin D  Analogs Other (See Comments)    Rash on face if it contains sunflower seed oil Excessive urine output and thirsty, increase in gas    FAMILY HISTORY:  family history includes Alcohol abuse in his father; Asthma in an other family member; Cancer in his paternal grandfather; Dementia in his paternal grandmother; Heart failure in his maternal grandmother; Hyperlipidemia in his mother and another family member;  Hypertension in his maternal grandmother and another family member; Learning disabilities in his mother; Other in his maternal aunt, maternal grandmother, maternal uncle, and mother; Vision loss in an other family member. SOCIAL HISTORY:  reports that he has never smoked. He has been exposed to tobacco smoke. He has never used smokeless tobacco. He reports that he does not drink alcohol and does not use drugs.   Review of Systems:  Gen:  Denies  fever, sweats, chills weight loss  HEENT: Denies blurred vision, double vision, ear pain, eye pain, hearing loss, nose bleeds, sore throat Cardiac:  No dizziness, chest pain or heaviness, chest tightness,edema, No JVD Resp:   No cough, -sputum production, -shortness of breath,-wheezing, -hemoptysis,  Gi: Denies swallowing difficulty, stomach pain, nausea or vomiting, diarrhea, constipation, bowel incontinence Gu:  Denies bladder incontinence, burning urine Ext:   Denies Joint pain, stiffness or swelling Skin: Denies  skin rash, easy bruising or bleeding or hives Endoc:  Denies polyuria, polydipsia , polyphagia or weight change Psych:   Denies depression, insomnia or hallucinations  Other:  All other systems negative  VITAL SIGNS: Ht 5' (1.524 m)   BMI 25.62 kg/m  BP 120/70   Pulse (!) 118   Temp 97.6 F (36.4 C) Comment: auxillary.  Ht 5' (1.524 m)   Wt 131 lb (59.4 kg)   SpO2 92%   BMI 25.58 kg/m     Physical Examination:   General Appearance: No distress  EYES PERRLA, EOM intact.   NECK Supple, No JVD Pulmonary: normal breath sounds, No wheezing.  CardiovascularNormal S1,S2.  No m/r/g.   Abdomen: Benign, Soft, non-tender. Skin:   warm, no rashes, no ecchymosis  Extremities: normal, no cyanosis, clubbing. Neuro:without focal findings,  speech normal  PSYCHIATRIC: Mood, affect within normal limits.   ASSESSMENT AND PLAN  OSA Patient is using and benefiting from PAP therapy. Reviewed compliance data with patient's caretakers.  For skin breakdown, tried fitting patient with the Airfit F40 FFM. Discussed the consequences of untreated sleep apnea. Advised patient's caretakers to only keep PAP mask on while patient is sleeping. Will follow up in 6 months to review PAP efficacy and compliance data.   Duchenne muscular dystrophy Referring patient to pulmonary for O2 management and chest vest.     Patient's caretakers satisfied with Plan of action and management. All questions answered  I spent a total of 52 minutes reviewing chart data, face-to-face evaluation with the patient, counseling and coordination of care as detailed above.    Marte Celani, M.D.  Sleep Medicine [place] [facility]

## 2024-10-01 ENCOUNTER — Telehealth: Payer: Self-pay | Admitting: Cardiovascular Disease

## 2024-10-01 ENCOUNTER — Other Ambulatory Visit (INDEPENDENT_AMBULATORY_CARE_PROVIDER_SITE_OTHER): Payer: Self-pay | Admitting: Family

## 2024-10-01 DIAGNOSIS — H1013 Acute atopic conjunctivitis, bilateral: Secondary | ICD-10-CM

## 2024-10-01 NOTE — Telephone Encounter (Signed)
 Pt c/o medication issue:  1. Name of Medication:   metoprolol  tartrate (LOPRESSOR ) 50 MG tablet    2. How are you currently taking this medication (dosage and times per day)? As written   3. Are you having a reaction (difficulty breathing--STAT)? No   4. What is your medication issue? S[place]lotte, LPN called in stating pt's mother thinks this medication is making him sleep too much, but his BP has been fine. She asked to speak with a nurse about it.

## 2024-10-01 NOTE — Telephone Encounter (Signed)
 Returned call to patient's [facility] LPN, she states that patient's mom was concerned because pt has become more drowsy. LPN states pt's HR and BP are both good on the Metoprolol  50 mg twice a day. Pt's mom was given the phone and she explained that she feels that when he takes his seizure medication and his Metoprolol  are given together that he becomes very sleepy and drowsy.  Pt's mom stated that when pt was in the hospital they gave the 2 medications an hour apart and he was less drowsy.  I explained to her that she can also do that at home.  She had the [facility] LPN, Sharylotte, speak with me and I explained that the 2 medications could be given an hour apart from one another.  I explained that if after a week if the patient remains  drowsy after separating the 2 medication to give our office a call back and possibly a medication adjustment may be needed. Both pt's mom and HH LPN verbalized understanding and thanked me for the call back.

## 2024-10-07 ENCOUNTER — Ambulatory Visit (INDEPENDENT_AMBULATORY_CARE_PROVIDER_SITE_OTHER): Payer: Self-pay

## 2024-10-07 NOTE — Progress Notes (Signed)
 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, seizures, sleep apnea requiring treatment by Bi-Pap, dysphagia, G-tube  Recent hospitalizations: 9/20 - 09/03/24 d/t pneumonia ans sepsis  Food allergies/contraindications: apple juice, psyllium, multivitamins, calcitonin, sunflower oil  Pertinent Medications: see medication list  Vitamins/Supplements: none  Pertinent labs: (during hospital admission)  Component Ref Range & Units  (09/03/24)  WBC 7.1  RBC 4.28  Hemoglobin 12.3 Low   HCT 40.4  MCV 94.4  MCH 28.7  MCHC 30.4  RDW 14.4  Platelets 248   Component Ref Range & Units  (09/03/24)  Sodium 139  Potassium 4.3  Chloride 88 Low   CO2 39 High   Glucose, Bld 108 High   Comment: Glucose reference range applies only to samples taken after fasting for at least 8 hours.  BUN 8  Creatinine, Ser <0.30 Low   Calcium  8.9    Notes: Lucas [name] Mayers, 23 y.o., seen in person today accompanied by mother and [facility] for an initial appointment regarding G-tube feedings. Mom reported that Raymone did not tolerate any adult formulas previously tried. He had to decrease feeding volume and rate after trying to switch formulas d/t GI issues. Mom is interested in going back to previous volume and rate, but would like to increase it slowly d/t tolerance concerns. He takes Myralax with prune juice daily. No GI issues reported.   Mom had no additional questions or concerns at this time.   Nutrition Assessment:  Anthropometrics:  Wt Readings from Last 5 Encounters:  10/14/24 117 lb (53.1 kg)  09/17/24 131 lb (59.4 kg)  08/31/24 131 lb 2.8  oz (59.5 kg)  06/18/24 113 lb (51.3 kg)  03/26/24 109 lb 5.6 oz (49.6 kg)    Ht Readings from Last 5 Encounters:  10/14/24 4' 11.84 (1.52 m)  09/17/24 5' (1.524 m)  08/31/24 5' 4 (1.626 m)  06/18/24 5' (1.524 m)  03/23/24 5' (1.524 m)     BMI Readings from Last 5 Encounters:  10/14/24 22.97 kg/m  09/17/24 25.58 kg/m  08/31/24 22.52 kg/m  06/18/24 22.07 kg/m  03/26/24 21.36 kg/m    IBW based on BMI @ 18.5 kg/m: 43 kg  Estimated minimum needs: Based on weight 53.1 kg Calories: 1200-1300 kcal/day  Protein: 43-53 g/day  Fluid: 38 mL/kg/day (Holliday Segar)  Feeding Hx: (From previous records)  Feeding: DME: [employer] - fax [phone] Formula: Current regimen: Pediasure Grow and Gain Day feeds: 1 container/ 237 ml at 237 ml/hr x 4 feedings at 6am, 10am, 2pm, 6pm Overnight feeds: none           FWF: 50 ml before and after feedings            PO: limited tastes Supplements: none  Dietary Intake Hx:  DME: [facility]  Formula: Pediasure Grow and Gain Current regimen:  Day feeds: 205 mL @ 170 mL/hr x 4 feeds  (7 AM, 11 AM, 3 PM, 7 PM) Total volume: 820 mL  FWF: 50 mL before and after feeds; 10 mL with meds 2x/day Supplements: 5 oz prune juice + Myralax 1x/day  Provides: 820 mL, 830 kcal, 24.2 g of protein (0.5 g/kg), and 629 + 400 + 20 mL (20 mL/kg) based on wt 53.1 kg.  Prune juice: ~105 kcal  Chewing/swallowing difficulties with foods or liquids: [x]  Yes []  No  Texture modifications: [x]  Yes []  No  PO foods: none PO beverages: none Previous Nutrition Supplements Tried: Ensure (rash),    GI: 1x/day GU: yellow, no concerns N/V: none  Nutrition Diagnosis: Inadequate oral intake related to dysphagia as evidenced by pt dependent on G-tube feedings to meet 100% of nutritional needs. (Ongoing)  Intervention: Discussed pt's weight and current regimen. Discussed needs for age. Discussed recommendations below. All questions answered, family in agreement with  plan.   Nutrition Recommendations: - New feeding plan:  Formula: Pediasure Grow and Gain Day feeds: 215 mL @ 180 mL/hr x 4 feeds  (7 AM, 11 AM, 3 PM, 7 PM)  If well tolerated for 7 days: Day feeds: 237 mL @ 190 mL/hr x 4 feeds  (7 AM, 11 AM, 3 PM, 7 PM)  Increase feeding rate by 10 mL/hr every 7 days until goal: Day feeds: 237 mL @ 240 mL/hr x 4 feeds  (7 AM, 11 AM, 3 PM, 7 PM) FWF: 50 mL before and after feeds; 10 mL with meds 2x/day Supplements: 5 oz prune juice + Myralax 1x/day  Provides: 948 mL, 960 kcal, 28 g of protein (0.53 g/kg), and 800 mL + 400 mL + 20 mL (23 mL/kg) based on weight 53.1 kg.  Prune juice: ~105 kcal  - Follow SLP recommendations.   Teach back method used.  Monitoring/Evaluation: Continue to Monitor: - Weight trends  - TF tolerance - PO intake - Add Beneprotein - Fluid intake  Follow-up in in 7 months with Dr. Waddell.   Total time spent in chart review, face-to-face counseling, and documentation: 60 minutes.

## 2024-10-14 ENCOUNTER — Ambulatory Visit (INDEPENDENT_AMBULATORY_CARE_PROVIDER_SITE_OTHER): Payer: Self-pay

## 2024-10-14 VITALS — Ht 59.84 in | Wt 117.0 lb

## 2024-10-14 DIAGNOSIS — Z931 Gastrostomy status: Secondary | ICD-10-CM

## 2024-10-14 DIAGNOSIS — R638 Other symptoms and signs concerning food and fluid intake: Secondary | ICD-10-CM

## 2024-10-14 DIAGNOSIS — R1310 Dysphagia, unspecified: Secondary | ICD-10-CM

## 2024-10-14 DIAGNOSIS — R140 Abdominal distension (gaseous): Secondary | ICD-10-CM

## 2024-10-14 NOTE — Patient Instructions (Addendum)
 Day feeds: 215 mL @ 180 mL/hr x 4 feeds  (7 AM, 11 AM, 3 PM, 7 PM)  If well tolerated for 7 days: Day feeds: 237 mL @ 190 mL/hr x 4 feeds  (7 AM, 11 AM, 3 PM, 7 PM)  If well tolerated for 7 days: Day feeds: 237 mL @ 200 mL/hr x 4 feeds  (7 AM, 11 AM, 3 PM, 7 PM)  If well tolerated for 7 days: Day feeds: 237 mL @ 210 mL/hr x 4 feeds  (7 AM, 11 AM, 3 PM, 7 PM)  If well tolerated for 7 days: Day feeds: 237 mL @ 220 mL/hr x 4 feeds  (7 AM, 11 AM, 3 PM, 7 PM)  If well tolerated for 7 days: Day feeds: 237 mL @ 230 mL/hr x 4 feeds  (7 AM, 11 AM, 3 PM, 7 PM)  If well tolerated for 7 days: Goal Day feeds: 237 mL @ 240 mL/hr x 4 feeds  (7 AM, 11 AM, 3 PM, 7 PM)

## 2024-10-18 NOTE — Telephone Encounter (Signed)
 Pt's nurse calling to make provider aware that pt has gone back to taking Metoprolol  and seizure medication together. Please advise

## 2024-10-18 NOTE — Telephone Encounter (Signed)
 Returned call to [facility], who reported that the patient's mother wishes to resume giving Metoprolol  and the seizure medication together. HHN transferred the call to the patient's mother.  The patient's mother stated that when she administered Metoprolol  and the seizure medication one hour apart, the patient became increasingly drowsy, and his oxygen  saturation decreased from 98% to 93-94%. She expressed a preference to resume giving both medications together, as the patient had previously tolerated this regimen for approximately seven months.  I advised the patient's mother that she may resume giving the medications together as before, but that I would also contact Dr. Gollan for further recommendations. I reassured her that oxygen  saturation in the 93-94% range remains within normal limits, though she stated she prefers the patient's O2 saturations to be higher.  Both pt's mother and HHN verbalized understanding and thanked me for the call back.

## 2024-10-25 ENCOUNTER — Encounter: Payer: Self-pay | Admitting: Internal Medicine

## 2024-10-25 ENCOUNTER — Ambulatory Visit (INDEPENDENT_AMBULATORY_CARE_PROVIDER_SITE_OTHER): Admitting: Internal Medicine

## 2024-10-25 VITALS — BP 110/60 | HR 99 | Temp 98.1°F | Ht 60.0 in | Wt 117.0 lb

## 2024-10-25 DIAGNOSIS — R0689 Other abnormalities of breathing: Secondary | ICD-10-CM

## 2024-10-25 DIAGNOSIS — G4733 Obstructive sleep apnea (adult) (pediatric): Secondary | ICD-10-CM | POA: Diagnosis not present

## 2024-10-25 DIAGNOSIS — J9611 Chronic respiratory failure with hypoxia: Secondary | ICD-10-CM

## 2024-10-25 DIAGNOSIS — G7101 Duchenne or Becker muscular dystrophy: Secondary | ICD-10-CM | POA: Diagnosis not present

## 2024-10-25 DIAGNOSIS — Z8701 Personal history of pneumonia (recurrent): Secondary | ICD-10-CM

## 2024-10-25 DIAGNOSIS — G7100 Muscular dystrophy, unspecified: Secondary | ICD-10-CM

## 2024-10-25 NOTE — Progress Notes (Signed)
 Name:Lucas [name] MRN: [id number] DOB: Feb 07, 2001   CHIEF COMPLAINT:  Respiratory insufficiency  Recurrent pneumonias Respiratory muscle fatigue OSA on BiPAP   HISTORY OF PRESENT ILLNESS:  Lucas [name] is a 23 y.o. w/ a h/o Duchenne muscular dystrophy, intellectual disability, autism and epilepsy who presents for BIPAP F/U visit. Per caretaker and patient's mother, patient is using PAP therapy every night, which is confirmed by compliance data. He is currently using a FFM, which has causes extensive skin breakdown on nasal bridge.    Patient with recurrent bouts of pneumonia has significant respiratory sufficiency Patient has significant cognitive impairment Patient is unable to clear his own secretions Patient has had smart vest therapy from [employer] I have asked patients mother and caregiver to contact the company so we can fill out forms for renewal of Smart vest therapy Patient does have a CoughAssist device through this company as well  Patient is at high risk for recurrent aspiration and pneumonia patient may need tracheostomy and ventilatory support at some point in his life   PAST MEDICAL HISTORY :   has a past medical history of Autism, Autism, Community acquired pneumonia of left lower lobe of lung (03/03/2022), Diarrhea (06/09/2015), Family history of adverse reaction to anesthesia, Muscular dystrophy (HCC), Scoliosis, Seizures (HCC), Sepsis (HCC) (03/03/2022), Transient alteration of awareness, Viral gastroenteritis, and Yeast infection of the skin (10/24/2020).  has a past surgical history that includes Circumcision; Laparoscopic gastrostomy (N/A, 05/24/2017); and Spine surgery (N/A). Prior to Admission medications   Medication Sig Start Date End Date Taking? Authorizing Provider  acetaminophen  (TYLENOL ) 160 MG/5ML elixir Take 20.3 mLs (650 mg total) by mouth every 4 (four) hours as needed for pain. 12/02/22   Lucas Mylar, MD  carbamazepine  (TEGRETOL ) 100 MG  chewable tablet TAKE 1 & 1/2 TABLETS BY MOUTH TWICE A DAY 11/27/23   Lucas Corean HERO, MD  levalbuterol  (XOPENEX ) 1.25 MG/3ML nebulizer solution Take 1.25 mg by nebulization every 4 (four) hours as needed. 03/03/22   [provider]  metoprolol  tartrate (LOPRESSOR ) 50 MG tablet Place 1 tablet (50 mg total) into feeding tube 2 (two) times daily. 02/13/24   Lucas Qualia, MD  Nutritional Supplements (PEDIASURE GROW & GAIN) LIQD Drink 1 bottle spaced out 4 times a day 05/29/23   Lucas Corean HERO, MD  nystatin  (MYCOSTATIN /NYSTOP ) powder Apply 1 Application topically 3 (three) times daily as needed. 08/02/22   [provider]  polyethylene glycol powder (GOODSENSE CLEARLAX) 17 GM/SCOOP powder TAKE 17 GM BY MOUTH TWICE DAILY AS DIRECTED WITH PRUNE JUICE 10/31/23   Vanga, Corinn Skiff, MD  prednisoLONE  (ORAPRED ) 15 MG/5ML solution PLACE 3 ML INTO FEEDING TUBE DAILY 12/07/23   Lucas Corean HERO, MD   Allergies  Allergen Reactions   Lisinopril Rash    Red bubbly rash   Penicillins Hives and Rash    Has patient had a PCN reaction causing immediate rash, facial/tongue/throat swelling, SOB or lightheadedness with hypotension: Yes Has patient had a PCN reaction causing severe rash involving mucus membranes or skin necrosis: Yes Has patient had a PCN reaction that required hospitalization: No Has patient had a PCN reaction occurring within the last 10 years: No If all of the above answers are NO, then may proceed with Cephalosporin use.    Actical     Red in face, itchy, bumps   Apple Juice    Docusate Sodium      Rash and cant sleep   Metamucil [Psyllium] Other (See Comments)  Interacted with seizure medicine   Multivitamins     Red in face, itchy, bumps   Other Other (See Comments)    Senkot -- gets rash and cant sleep   Aloe Vera Rash    Mom report red little dots after applying onto face   Calcitonin Rash   Esomeprazole Magnesium  Nausea And Vomiting   Omeprazole Other  (See Comments)    Constipation and possible rash after drug was stopped   Simethicone  Palpitations and Rash   Sunflower Oil Itching, Rash and Other (See Comments)    GI upset - Avoid products containing this additive   Vitamin D  Analogs Other (See Comments)    Rash on face if it contains sunflower seed oil Excessive urine output and thirsty, increase in gas    FAMILY HISTORY:  family history includes Alcohol abuse in his father; Asthma in an other family member; Cancer in his paternal grandfather; Dementia in his paternal grandmother; Heart failure in his maternal grandmother; Hyperlipidemia in his mother and another family member; Hypertension in his maternal grandmother and another family member; Learning disabilities in his mother; Other in his maternal aunt, maternal grandmother, maternal uncle, and mother; Vision loss in an other family member. SOCIAL HISTORY:  reports that he has never smoked. He has been exposed to tobacco smoke. He has never used smokeless tobacco. He reports that he does not drink alcohol and does not use drugs.  BP 110/60   Pulse 99   Temp 98.1 F (36.7 C) (Axillary)   Ht 5' (1.524 m)   Wt 117 lb (53.1 kg)   SpO2 95%   BMI 22.85 kg/m    Patient unable to participate in conversation due to his cognitive dysfunction ROS unobtainable  Physical Examination:  General Appearance: No distress  EYES EOM intact.   NECK Supple, No JVD Pulmonary: normal breath sounds, No wheezing.  CardiovascularNormal S1,S2.  No m/r/g.   Severe disability cognitive impairment      ASSESSMENT AND PLAN  23 year old white male with significant muscular dystrophy with recurrent bouts of pneumonia poor respiratory effort respiratory insufficiency with underlying sleep apnea  OSA Continue BiPAP as prescribed by Lucas [name] AirFit F40 FFM  Respiratory insufficiency recurrent pneumonia Recommend Smart vest therapy twice daily as prescribed I have asked that patient's  caregiver and mom provide form so we can fill out for renewal Patient has a CoughAssist device Despite all this efforts patient has significant respiratory compromise due to his muscular dystrophy I can see patient progressing to severe respiratory failure requiring trach and ventilatory support  I will obtain overnight pulse oximetry while on BiPAP  Duchenne muscular dystrophy Referring patient to pulmonary for O2 management and chest vest.     Patient's caretakers satisfied with Plan of action and management. All questions answered  At next office visit I will discuss potential need for tracheostomy and ventilator therapy at home.  Patient is at high risk for progressive respiratory failure   CURRENT MEDICATIONS REVIEWED AT LENGTH WITH PATIENT TODAY   Patient  satisfied with Plan of action and management. All questions answered, very lengthy discussion with patient caregiver and mother answered all questions   Follow up 6 months   I spent a total of 60 minutes dedicated to the care of this patient on the date of this encounter to include pre-visit review of records, face-to-face time with the patient discussing conditions above, post visit ordering of testing, clinical documentation with the electronic health record, making appropriate referrals as documented,  and communicating necessary information to the patient's healthcare team.    The Patient requires high complexity decision making for assessment and support, frequent evaluation and titration of therapies, application of advanced monitoring technologies and extensive interpretation of multiple databases.  Patient satisfied with Plan of action and management. All questions answered    Nickolas Alm Cellar, M.D.  [facility]monary [facility] Critical [facility] Medicine  Medical Director [facility] [place]

## 2024-10-25 NOTE — Patient Instructions (Signed)
 Please contact [employer] company obtain forms so we can fill out for your smart vest Check oxygen  levels while on CPAP Continue CPAP as prescribed-follow-up with Dr. Jess

## 2024-10-30 NOTE — Progress Notes (Signed)
 Patient: Lucas [name] MRN: [id number] Sex: male DOB: 2001/02/27  Provider: Corean Geralds, MD Location of Care: [facility] Note type: Routine return visit  History of Present Illness: Referral Source: Edie CANDIE Larch, MD  History from: patient and prior records Chief Complaint: Pediatric Complex Care  Lucas [name] is a 23 y.o. male with history of Duchenne Muscular Dystrophy, autism spectrum disorder, level 3, restrictive lung disease, compensated congestive heart failure, severe dysphagia requiring tube feeding, and obstructive sleep apnea who I am seeing in follow-up for complex care management. Patient was last seen on 05/13/2024 where I requested mom video any events concerning for seizure, continued medications, planned to follow up on GI referral, and recommended calling his pulmonologist about a new face mask for his bipap. He saw Ellouise Bollman, [facility] on 08/16/2024 where she provided samples of different formulas as insurance will no longer cover Pediasure. Since that appointment, patient has been to the hospital for pneumonia on 08/31/2024.   Patient presents today with {CHL AMB PARENT/GUARDIAN:[id number]} who reports the following:   Symptom management:  Mom is concerned that Burl has had pneumonias, and the nurses were helping her determine when he was sick.   He is on 4 cans of Pediasure per day. He was on five, but it was decreased due to weight gain.   He has not done a pulse ox study because he lost his nursing. His oxygen  levels have dropped to 84, mom feels his disease is progressing. Mom has been using his chest vest twice daily. He wears bipap at night.   Mom is concerned about a noise he is making related to his breathing. It does not happen every day, but it has been going on for several months.   The breakdown on his nose is improved. Mom feels the wounds on his buttock is healing. She puts bacitracin on it around five times per  day.   Care coordination (other providers): Patient saw Dr. Unk with [facility] on 06/03/2024 where she ordered formula and tube feeding supplies.   Patient saw Dr. Gollan with cardiology on 06/18/2024 where he continued medications and recommended follow up as needed.   Patient saw Dr. Jess with sleep medicine on 09/17/2024 where he ordered a new face mask for bipap and referred to pulmonology for O2 management and chest vest. Patient saw Dr. Isaiah with pulmonology on 10/25/2024 where he recommended continuing to use his chest vest and cough assist, ordered a pulse oximeter, and planned to discuss potential need for tracheostomy and ventilator therapy at home at the next appointment.   Patient saw Graydon Rilla Willaim Herold, RD on 10/14/2024 where she increased his feeds and rate.   He has continued to follow with Dr. Diedra. On 10/24/2024, he recommended reducing his feeds so that he does not gain weight and recommended bacitracin for a buttock wound.   Case management needs:  Mom is very upset that he does not currently have nursing through [facility] because it was not approved through [place]. Mom does not want CNAs in the home, she would like his nursing back. Mom has trouble with transfers on her own. Mom has not been able to leave the house since she doesn't have nursing, she does not have a lot of support to help take care of Ryle without nursing.   Equipment needs:  At the last visit, ordered a wheelchair, bath chair, and feeding supplies.   Getting equipment well.   Decision making/Advanced care planning:  Mom would like to proceed with a trach when it is necessary.   Diagnostics/Patient history:   Past Medical History Past Medical History:  Diagnosis Date   Autism    Autism    Community acquired pneumonia of left lower lobe of lung 03/03/2022   Diarrhea 06/09/2015   Family history of adverse reaction to anesthesia    MGGM- N/V   Muscular dystrophy (HCC)    Scoliosis     Seizures (HCC)    Last one 2013   Sepsis (HCC) 03/03/2022   Transient alteration of awareness    Viral gastroenteritis    Yeast infection of the skin 10/24/2020    Surgical History Past Surgical History:  Procedure Laterality Date   CIRCUMCISION     at birth   LAPAROSCOPIC GASTROSTOMY N/A 05/24/2017   Procedure: LAPAROSCOPIC GASTROSTOMY TUBE PLACEMENT;  Surgeon: Chuckie Casimiro KIDD, MD;  Location: [name];  Service: General;  Laterality: N/A;   SPINE SURGERY N/A    Phreesia 06/29/2020    Family History family history includes Alcohol abuse in his father; Asthma in an other family member; Cancer in his paternal grandfather; Dementia in his paternal grandmother; Heart failure in his maternal grandmother; Hyperlipidemia in his mother and another family member; Hypertension in his maternal grandmother and another family member; Learning disabilities in his mother; Other in his maternal aunt, maternal grandmother, maternal uncle, and mother; Vision loss in an other family member.   Social History Social History   Social History Narrative   ** Merged History Encounter **       He lives with mother. He graduated high school in 22.  He is not currently in a day program      Allergies Allergies  Allergen Reactions   Lisinopril Rash    Red bubbly rash   Penicillins Hives and Rash    Has patient had a PCN reaction causing immediate rash, facial/tongue/throat swelling, SOB or lightheadedness with hypotension: Yes Has patient had a PCN reaction causing severe rash involving mucus membranes or skin necrosis: Yes Has patient had a PCN reaction that required hospitalization: No Has patient had a PCN reaction occurring within the last 10 years: No If all of the above answers are NO, then may proceed with Cephalosporin use.    Actical     Red in face, itchy, bumps   Apple Juice    Docusate Sodium      Rash and cant sleep   Metamucil [Psyllium] Other (See Comments)    Interacted with  seizure medicine   Multivitamins     Red in face, itchy, bumps   Other Other (See Comments)    Senkot -- gets rash and cant sleep   Aloe Vera Rash    Mom report red little dots after applying onto face   Calcitonin Rash   Esomeprazole Magnesium  Nausea And Vomiting   Omeprazole Other (See Comments)    Constipation and possible rash after drug was stopped   Simethicone  Palpitations and Rash   Sunflower Oil Itching, Rash and Other (See Comments)    GI upset - Avoid products containing this additive   Vitamin D  Analogs Other (See Comments)    Rash on face if it contains sunflower seed oil Excessive urine output and thirsty, increase in gas    Medications Current Outpatient Medications on File Prior to Visit  Medication Sig Dispense Refill   acetaminophen  (TYLENOL ) 160 MG/5ML elixir Take 20.3 mLs (650 mg total) by mouth every 4 (four) hours as needed for  pain. 120 mL 0   carbamazepine  (TEGRETOL ) 100 MG chewable tablet TAKE 1 & 1/2 TABLETS BY MOUTH TWICE A DAY 270 tablet 2   carbamazepine  (TEGRETOL ) 100 MG chewable tablet Chew 150 mg by mouth 2 (two) times daily.     Control Gel Formula Dressing (DUODERM CGF SPOTS EXTRA THIN) MISC Apply 1 Units topically.     hydrOXYzine  (ATARAX ) 10 MG/5ML syrup Place 12.5 mLs (25 mg total) into feeding tube 3 (three) times daily as needed for anxiety. 240 mL 0   levalbuterol  (XOPENEX ) 1.25 MG/3ML nebulizer solution Take 1.25 mg by nebulization every 4 (four) hours as needed for wheezing or shortness of breath (or cough). Future refills should be directed to his PCP -Dr Jerona Sayre 72 mL 0   metoprolol  tartrate (LOPRESSOR ) 50 MG tablet Place 1 tablet (50 mg total) into feeding tube 2 (two) times daily. 60 tablet 1   metoprolol  tartrate (LOPRESSOR ) 50 MG tablet Place 50 mg into feeding tube 2 (two) times daily.     Nutritional Supplements (PEDIASURE GROW & GAIN) LIQD Give 1 container ( ) 5 times a day via pump at 237ml/hour 35550 mL 12   nystatin   (MYCOSTATIN /NYSTOP ) powder Apply 1 Application topically 3 (three) times daily as needed.     polyethylene glycol powder (GLYCOLAX /MIRALAX ) 17 GM/SCOOP powder Take 17 g by mouth daily. Dissolve 1 capful (17g) in 4-8 ounces of liquid and take by mouth daily.     polyethylene glycol powder (GOODSENSE CLEARLAX) 17 GM/SCOOP powder TAKE 17 GM BY MOUTH EVERY OTHER DAY WITH 8oz PRUNE JUICE 3060 g 3   prednisoLONE  (ORAPRED ) 15 MG/5ML solution PLACE 4 MLS INTO FEEDING TUBE DAILY 360 mL 3   prednisoLONE  (ORAPRED ) 15 MG/5ML solution Place 3 mLs (9 mg total) into feeding tube daily.     ZINC  OXIDE, TOPICAL, EX Apply topically once daily as needed Rash around G-tube and perineal area     No current facility-administered medications on file prior to visit.   The medication list was reviewed and reconciled. All changes or newly prescribed medications were explained.  A complete medication list was provided to the patient/caregiver.  Physical Exam There were no vitals taken for this visit. Weight for age: Facility age limit for growth %iles is 20 years.  Length for age: Facility age limit for growth %iles is 20 years. BMI: There is no height or weight on file to calculate BMI. No results found.   Diagnosis: No diagnosis found.   Assessment and Plan BRITTNEY CARAWAY is a 23 y.o. male with history of Duchenne Muscular Dystrophy, autism spectrum disorder, level 3, restrictive lung disease, compensated congestive heart failure, severe dysphagia requiring tube feeding, and obstructive sleep apnea who presents for follow-up in the pediatric complex care clinic.  Symptom management:     Care coordination:  Case management needs:   Equipment needs:  Due to patient's medical condition, patient is indefinitely incontinent of stool and urine.  It is medically necessary for them to use diapers, underpads, and gloves to assist with hygiene and skin integrity.  They require a frequency of up to 200 a  month.   Decision making/Advanced care planning:  The CARE PLAN for reviewed and revised to represent the changes above.  This is available in Epic under snapshot, and a physical binder provided to the patient, that can be used for anyone providing care for the patient.    I spend ** minutes on day of service on this patient including review of chart,  discussion with patient and family, coordination with other providers and management of orders and paperwork.      No follow-ups on file.  Corean Geralds MD MPH Neurology,  Neurodevelopment and Neuropalliative care [name] Child Neurology  [address] [place], [place], [place] [address] Phone: [phone]

## 2024-11-01 ENCOUNTER — Encounter (INDEPENDENT_AMBULATORY_CARE_PROVIDER_SITE_OTHER): Payer: Self-pay | Admitting: Pediatrics

## 2024-11-01 NOTE — Telephone Encounter (Signed)
 Contacted [facility] to inquire about this, I spoke a representative by the name of Monica.   Lucas [name] stated that no reason was given as to why his services were denied. They filed an appeal yesterday, Medicaid requested 5 day to respond and that is where they are in the process. Lucas [name] assumes that a reason should be provided with the appeal response.   SS, CCMA

## 2024-11-01 NOTE — Telephone Encounter (Signed)
 Contacted Medicaid and spoke to a representative by the name of Chandler.   Barron stated that he did not see a PA on file for PDN and that his account is currently showing active coverage - Full Medicaid.   I conferenced this call with Odella with [name] and [place] with Medicaid.   I relayed this message form [place] to [place] and [place] offered to provide a PA number that was given at the time of the Denial.   After reviewing the patients chart [place] fund that no Pa number was given, she was however, able to provide a conformation number.   Conformation Number: [id number] W  [place] ran that number and stated that he was unable to find a PA with that confirmation number.   Monica then explained that their PA for PDN expired of November 5th and are unable to provide services until they receive authorization.   Maria Antonia then explained that he was in the Provider Services for Benefit Eligibility department and wouldn't be able to assist any further. He stated that this sounds like an [employer] issue and recommended they reach out to them.   I tried to contact [employer] myself and was unable to get anywhere due to the NCID not being able to be shared.  The [employer] representative hung up the call.   ___  Odella agreed to email me what it is that she is seeing on her side.  Odella stated that there isn't much else they can do on their end other than wait to hear back from insurance. She is aware that mom refused CNA services of which they do not provide. She stated that if that is mom's choice, they cannot force her to accept it.   Clinical supervisor will be calling us  as a official clinical capacity, her name is Leita.  SS, CCMA

## 2024-11-01 NOTE — Telephone Encounter (Signed)
 Mom called back.   She reiterated what was being said in this message.   Mom is worried because Lucas [name] has had pneumonia twice and the nurses were the ones to detect it.   Mom knows how to operate his CPAP Machine   Last G-Tube Change - A couple months ago. Will need to be changed soon.   Mom refused CNA services because they are unable to operate any of his equipment or give him his medications. She stated that the only thing that they are able to do is bathe him and take his blood pressure.   I asked mom who informed her of the CNA's duties, mom stated that she looked it up. [facility] informed her that the CNA's are able to administer medications and operate his equipment. Mom stated that because [name] is based out of [place], the CNA's may be able to operate equipment and manage medications in [place] but not in [place].   I informed mom that we would look into this and call her back by the end of the day with something.   Mom would like for this process to be expedited as he is currently without care other than her, and she does not know what all needs to be done.   Mom provided her new phone number [phone]. Updated within his chart.   SS, CCMA

## 2024-11-01 NOTE — Telephone Encounter (Signed)
 Attempted to contact patients mom to see if she was given an explanation as to why services were denied.   Mom unable to be reached.   Unable to LVM.  SS, CCMA

## 2024-11-11 ENCOUNTER — Ambulatory Visit (INDEPENDENT_AMBULATORY_CARE_PROVIDER_SITE_OTHER): Payer: Self-pay | Admitting: Pediatrics

## 2024-11-11 ENCOUNTER — Encounter (INDEPENDENT_AMBULATORY_CARE_PROVIDER_SITE_OTHER): Payer: Self-pay | Admitting: Pediatrics

## 2024-11-11 ENCOUNTER — Ambulatory Visit (INDEPENDENT_AMBULATORY_CARE_PROVIDER_SITE_OTHER): Admitting: Family

## 2024-11-11 VITALS — BP 122/72 | HR 100 | Temp 97.8°F | Wt 118.0 lb

## 2024-11-11 DIAGNOSIS — G7101 Duchenne or Becker muscular dystrophy: Secondary | ICD-10-CM

## 2024-11-11 DIAGNOSIS — J984 Other disorders of lung: Secondary | ICD-10-CM

## 2024-11-11 DIAGNOSIS — M2450 Contracture, unspecified joint: Secondary | ICD-10-CM | POA: Diagnosis not present

## 2024-11-11 DIAGNOSIS — G7100 Muscular dystrophy, unspecified: Secondary | ICD-10-CM | POA: Diagnosis not present

## 2024-11-11 DIAGNOSIS — Z8701 Personal history of pneumonia (recurrent): Secondary | ICD-10-CM | POA: Diagnosis not present

## 2024-11-11 DIAGNOSIS — G4733 Obstructive sleep apnea (adult) (pediatric): Secondary | ICD-10-CM

## 2024-11-11 DIAGNOSIS — Z7189 Other specified counseling: Secondary | ICD-10-CM

## 2024-11-11 DIAGNOSIS — M4145 Neuromuscular scoliosis, thoracolumbar region: Secondary | ICD-10-CM | POA: Diagnosis not present

## 2024-11-11 DIAGNOSIS — G40209 Localization-related (focal) (partial) symptomatic epilepsy and epileptic syndromes with complex partial seizures, not intractable, without status epilepticus: Secondary | ICD-10-CM

## 2024-11-11 DIAGNOSIS — Z431 Encounter for attention to gastrostomy: Secondary | ICD-10-CM

## 2024-11-11 DIAGNOSIS — Z931 Gastrostomy status: Secondary | ICD-10-CM

## 2024-11-11 MED ORDER — PREDNISOLONE SODIUM PHOSPHATE 15 MG/5ML PO SOLN: ORAL | 3 refills | Status: AC

## 2024-11-11 MED ORDER — CARBAMAZEPINE 100 MG PO CHEW: CHEWABLE_TABLET | ORAL | 2 refills | Status: AC

## 2024-11-11 NOTE — Patient Instructions (Addendum)
 Symptom management: Decrease his feeds to 3.5 cartons per day. We will follow up about Dr. Diedra Care Coordination: We will follow up with pulmonology about a pulse ox study, a sleep study, and discussing a possible tracheostomy Care management: You can visit [email or web address] to help advocate for Lum's nursing We will follow up on Brenton's nursing.

## 2024-11-11 NOTE — Patient Instructions (Signed)
 It was a pleasure to see you today! The g-tube was changed today. There is 4ml of water  in the balloon.   Instructions for you until your next appointment are as follows: Follow recommendations given by Dr Waddell today Please sign up for MyChart if you have not done so. Please plan to return for follow up in 3 months or sooner if needed.  Feel free to contact our office during normal business hours at [phone] with questions or concerns. If there is no answer or the call is outside business hours, please leave a message and our clinic staff will call you back within the next business day.  If you have an urgent concern, please stay on the line for our after-hours answering service and ask for the on-call neurologist.     I also encourage you to use MyChart to communicate with me more directly. If you have not yet signed up for MyChart within [facility], the front desk staff can help you. However, please note that this inbox is NOT monitored on nights or weekends, and response can take up to 2 business days.  Urgent matters should be discussed with the on-call pediatric neurologist.   At [facility], we are committed to providing exceptional care. You will receive a patient satisfaction survey through text or email regarding your visit today. Your opinion is important to me. Comments are appreciated.

## 2024-11-11 NOTE — Progress Notes (Unsigned)
 Lucas [name]   MRN:  [id number]  2001-01-10   Provider: Ellouise Bollman NP-C Location of Care: [facility]  Visit type: Return visit  Last visit: 08/16/2024  Referral source: Diedra Lame, MD History from: Epic chart and patient's mother  Brief history:  Copied from previous record: History of Duchenne Muscular Dystrophy, autism spectrum disorder, level 3, restrictive lung disease, compensated congestive heart failure, severe dysphagia requiring tube feeding, and obstructive sleep apnea   Due to his medical condition, Lucas [name] is indefinitely incontinent of stool and urine.  It is medically necessary for him to use diapers, underpads, and gloves to assist with hygiene and skin integrity.     Today's concerns: Lucas [name] is seen today for exchange of existing 14Fr 2.3cm MicKey balloon button gastrostomy tube He is seen today in joint visit with Dr Waddell in [facility]  Lucas [name] has been otherwise generally healthy since he was last seen. No health concerns today other than previously mentioned.  Review of systems: Please see HPI for neurologic and other pertinent review of systems. Otherwise all other systems were reviewed and were negative.  Problem List: Patient Active Problem List   Diagnosis Date Noted   Sepsis (HCC) 08/31/2024   CAP (community acquired pneumonia) 08/31/2024   PNA (pneumonia) 08/31/2024   Sepsis, unspecified organism (HCC) 03/24/2024   History of Respiratory arrest 02/11/24 requiring CPR (HCC) 03/24/2024   Acute hypoxic respiratory failure (HCC) 03/24/2024   Tachypnea 02/13/2024   Electrolyte abnormality 02/10/2024   SIRS (systemic inflammatory response syndrome) (HCC) 02/09/2024   Epilepsy (HCC) 02/09/2024   Abdominal distention 02/09/2024   Acute respiratory failure with hypoxia and hypercapnia (HCC) 02/09/2024   Lobar pneumonia 11/19/2022   Sinus tachycardia 03/03/2022   Ineffective airway  clearance 10/29/2021   Feeding by G-tube (HCC) 03/18/2019   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   Urinary retention 11/01/2018   Restrictive and obstructive lung disease on bilevel positive airway pressure (BiPAP) 10/03/2017   Failure to thrive in pediatric patient 05/24/2017   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   Right knee injury 06/30/2016   Constipation    Contractures involving both knees 07/21/2015   Seizure (HCC) 06/08/2015   Osteoporosis 10/30/2014   Autism spectrum disorder with accompanying language impairment and intellectual disability, requiring very substantial support 06/17/2014   Complex care coordination 04/30/2014   Partial epilepsy with impairment of consciousness (HCC) 04/30/2014   Duchenne muscular dystrophy (HCC) 04/30/2014     Past Medical History:  Diagnosis Date   Autism    Autism    Community acquired pneumonia of left lower lobe of lung 03/03/2022   Diarrhea 06/09/2015   Family history of adverse reaction to anesthesia    MGGM- N/V   Muscular dystrophy (HCC)    Scoliosis    Seizures (HCC)    Last one 2013   Sepsis (HCC) 03/03/2022   Transient alteration of awareness    Viral gastroenteritis    Yeast infection of the skin 10/24/2020    Past medical history comments: See HPI  Surgical history: Past Surgical History:  Procedure Laterality Date   CIRCUMCISION     at birth   LAPAROSCOPIC GASTROSTOMY N/A 05/24/2017   Procedure: LAPAROSCOPIC GASTROSTOMY TUBE PLACEMENT;  Surgeon: Chuckie Casimiro KIDD, MD;  Location: [name];  Service: General;  Laterality: N/A;   SPINE SURGERY N/A    Phreesia 06/29/2020     Family history: family history includes Alcohol abuse in  his father; Asthma in an other family member; Cancer in his paternal grandfather; Dementia in his paternal grandmother; Heart failure in his maternal grandmother; Hyperlipidemia in his mother and another family member; Hypertension in his  maternal grandmother and another family member; Learning disabilities in his mother; Other in his maternal aunt, maternal grandmother, maternal uncle, and mother; Vision loss in an other family member.   Social history: Social History   Socioeconomic History   Marital status: Single    Spouse name: Not on file   Number of children: Not on file   Years of education: Not on file   Highest education level: Not on file  Occupational History   Not on file  Tobacco Use   Smoking status: Never    Passive exposure: Yes   Smokeless tobacco: Never   Tobacco comments:    Mom smokes outside  Vaping Use   Vaping status: Never Used  Substance and Sexual Activity   Alcohol use: No   Drug use: No   Sexual activity: Never  Other Topics Concern   Not on file  Social History Narrative   ** Merged History Encounter **       He lives with mother. He graduated high school in 22.  He is not currently in a day program     Social Drivers of Health   Financial Resource Strain: Patient Declined (06/03/2024)   Received from [facility]   Overall Financial Resource Strain (CARDIA)    Difficulty of Paying Living Expenses: Patient declined  Food Insecurity: No Food Insecurity (09/01/2024)   Hunger Vital Sign    Worried About Running Out of Food in the Last Year: Never true    Ran Out of Food in the Last Year: Never true  Transportation Needs: Unmet Transportation Needs (09/01/2024)   PRAPARE - Administrator, Civil Service (Medical): Yes    Lack of Transportation (Non-Medical): Yes  Physical Activity: Not on file  Stress: Not on file  Social Connections: Unknown (09/01/2024)   Social Connection and Isolation Panel    Frequency of Communication with Friends and Family: Not on file    Frequency of Social Gatherings with Friends and Family: Not on file    Attends Religious Services: Never    Active Member of Clubs or Organizations: No    Attends Banker  Meetings: Never    Marital Status: Never married  Intimate Partner Violence: Unknown (09/01/2024)   Humiliation, Afraid, Rape, and Kick questionnaire    Fear of Current or Ex-Partner: No    Emotionally Abused: Not on file    Physically Abused: Not on file    Sexually Abused: Not on file    Past/failed meds: See allergy list  Allergies: Allergies  Allergen Reactions   Lisinopril Rash    Red bubbly rash   Penicillins Hives and Rash    Has patient had a PCN reaction causing immediate rash, facial/tongue/throat swelling, SOB or lightheadedness with hypotension: Yes Has patient had a PCN reaction causing severe rash involving mucus membranes or skin necrosis: Yes Has patient had a PCN reaction that required hospitalization: No Has patient had a PCN reaction occurring within the last 10 years: No If all of the above answers are NO, then may proceed with Cephalosporin use.    Actical     Red in face, itchy, bumps   Apple Juice    Docusate Sodium      Rash and cant sleep   Metamucil [Psyllium]  Other (See Comments)    Interacted with seizure medicine   Multivitamins     Red in face, itchy, bumps   Other Other (See Comments)    Senkot -- gets rash and cant sleep   Aloe Vera Rash    Mom report red little dots after applying onto face   Calcitonin Rash   Esomeprazole Magnesium  Nausea And Vomiting   Omeprazole Other (See Comments)    Constipation and possible rash after drug was stopped   Simethicone  Palpitations and Rash   Sunflower Oil Itching, Rash and Other (See Comments)    GI upset - Avoid products containing this additive   Vitamin D  Analogs Other (See Comments)    Rash on face if it contains sunflower seed oil Excessive urine output and thirsty, increase in gas    Immunizations: Immunization History  Administered Date(s) Administered   DTaP 08/20/2001, 10/26/2001, 01/11/2002, 10/18/2002, 08/19/2005   Dtap, Unspecified 08/20/2001, 10/26/2001, 01/11/2002, 10/18/2002,  08/19/2005   HIB (PRP-T) 08/20/2001, 10/26/2001, 01/11/2002, 07/11/2002   HPV 9-valent 06/24/2019   Hepatitis A, Ped/Adol-2 Dose 08/21/2014, 11/11/2016   Hepatitis B 2001/10/06, 08/20/2001, 01/11/2002   IPV 08/20/2001, 10/26/2001, 01/11/2002, 08/19/2005   Influenza Inj Mdck Quad Pf 09/10/2019, 10/15/2021, 10/17/2022   Influenza, Mdck, Trivalent,PF 6+ MOS(egg free) 10/19/2023   Influenza, Seasonal, Injecte, Preservative Fre 10/15/2009, 10/06/2010, 11/23/2011, 10/11/2012   Influenza,inj,Quad PF,6+ Mos 08/20/2015, 10/03/2016, 09/04/2017, 09/13/2018, 09/10/2020   Influenza-Unspecified 10/15/2009, 10/06/2010, 11/23/2011, 10/11/2012, 09/18/2013, 10/02/2014, 08/20/2015, 10/03/2016, 09/04/2017, 09/13/2018, 09/10/2019, 09/20/2020, 10/15/2021   MMR 10/18/2002, 08/19/2005   MenQuadfi_Meningococcal Groups ACYW Conjugate 02/26/2024   Meningococcal B, OMV 06/24/2019, 02/26/2024   Meningococcal Conjugate 08/21/2014, 02/16/2018   Moderna SARS-COV2 Booster Vaccination 11/18/2020, 04/29/2021   Moderna Sars-Covid-2 Vaccination 03/24/2020, 03/24/2020, 04/20/2020, 11/18/2020   Pneumococcal Conjugate-13 10/26/2001, 01/11/2002, 07/11/2002, 10/18/2002   Pneumococcal-Unspecified 10/26/2001, 01/11/2002, 07/11/2002, 10/18/2002   Tdap 10/11/2012   Varicella 07/11/2002, 10/15/2009    Diagnostics/Screenings:  Physical Exam: BP 122/72   Pulse 100   Temp 97.8 F (36.6 C)   Wt 118 lb (53.5 kg)   SpO2 91%   BMI 23.05 kg/m   Wt Readings from Last 3 Encounters:  11/11/24 118 lb (53.5 kg)  10/25/24 117 lb (53.1 kg)  10/14/24 117 lb (53.1 kg)  Examination was limited to the g-tube site as Lucas [name] is also being seen by Dr Waddell today.  The g-tube is intact, site clean and dry. Mild irritation at the upper aspect of the stoma.   Impression: Attention to G-tube [facility])  Duchenne muscular dystrophy (HCC)  Feeding by G-tube [facility])   Recommendations for plan of care: The patient's previous Epic records were  reviewed. No recent diagnostic studies to be reviewed with the patient. Zakai is seen today for exchange of existing 14Fr 2.3cm MicKey balloon button. The existing button was exchanged for new 14Fr 2.3cm MicKey balloon button without incident. The balloon was inflated with 4ml tap water . Placement was confirmed with the aspiration of gastric contents. Lucas [name] tolerated the procedure well.  Plan until next visit: Continue feedings and medications as prescribed  Reminded to check water  in the balloon once per week Call for questions or concerns Return in about 3 months (around 02/09/2025).  The medication list was reviewed and reconciled. No changes were made in the prescribed medications today. A complete medication list was provided to the patient.   Allergies as of 11/11/2024       Reactions   Lisinopril Rash   Red bubbly rash   Penicillins Hives, Rash  Has patient had a PCN reaction causing immediate rash, facial/tongue/throat swelling, SOB or lightheadedness with hypotension: Yes Has patient had a PCN reaction causing severe rash involving mucus membranes or skin necrosis: Yes Has patient had a PCN reaction that required hospitalization: No Has patient had a PCN reaction occurring within the last 10 years: No If all of the above answers are NO, then may proceed with Cephalosporin use.   Actical    Red in face, itchy, bumps   Apple Juice    Docusate Sodium     Rash and cant sleep   Metamucil [psyllium] Other (See Comments)   Interacted with seizure medicine   Multivitamins    Red in face, itchy, bumps   Other Other (See Comments)   Senkot -- gets rash and cant sleep   Aloe Vera Rash   Mom report red little dots after applying onto face   Calcitonin Rash   Esomeprazole Magnesium  Nausea And Vomiting   Omeprazole Other (See Comments)   Constipation and possible rash after drug was stopped   Simethicone  Palpitations, Rash   Sunflower Oil Itching, Rash, Other (See Comments)    GI upset - Avoid products containing this additive   Vitamin D  Analogs Other (See Comments)   Rash on face if it contains sunflower seed oil Excessive urine output and thirsty, increase in gas        Medication List        Accurate as of November 11, 2024 11:22 AM. If you have any questions, ask your nurse or doctor.          acetaminophen  160 MG/5ML elixir Commonly known as: TYLENOL  Take 20.3 mLs (650 mg total) by mouth every 4 (four) hours as needed for pain.   carbamazepine  100 MG chewable tablet Commonly known as: TEGRETOL  Chew 150 mg by mouth 2 (two) times daily.   carbamazepine  100 MG chewable tablet Commonly known as: TEGRETOL  TAKE 1 & 1/2 TABLETS BY MOUTH TWICE A DAY   DuoDERM CGF Spots Extra Thin Misc Apply 1 Units topically.   hydrOXYzine  10 MG/5ML syrup Commonly known as: ATARAX  Place 12.5 mLs (25 mg total) into feeding tube 3 (three) times daily as needed for anxiety.   levalbuterol  1.25 MG/3ML nebulizer solution Commonly known as: XOPENEX  Take 1.25 mg by nebulization every 4 (four) hours as needed for wheezing or shortness of breath (or cough). Future refills should be directed to his PCP -Dr Jerona Sayre   metoprolol  tartrate 50 MG tablet Commonly known as: LOPRESSOR  Place 50 mg into feeding tube 2 (two) times daily.   metoprolol  tartrate 50 MG tablet Commonly known as: LOPRESSOR  Place 1 tablet (50 mg total) into feeding tube 2 (two) times daily.   nystatin  powder Commonly known as: MYCOSTATIN /NYSTOP  Apply 1 Application topically 3 (three) times daily as needed.   PediaSure Grow & Gain Liqd Give 1 container ( ) 5 times a day via pump at 237ml/hour   polyethylene glycol powder 17 GM/SCOOP powder Commonly known as: GLYCOLAX /MIRALAX  Take 17 g by mouth daily. Dissolve 1 capful (17g) in 4-8 ounces of liquid and take by mouth daily.   polyethylene glycol powder 17 GM/SCOOP powder Commonly known as: GoodSense ClearLax TAKE 17 GM BY MOUTH  EVERY OTHER DAY WITH 8oz PRUNE JUICE   prednisoLONE  15 MG/5ML solution Commonly known as: ORAPRED  PLACE 4 MLS INTO FEEDING TUBE DAILY   prednisoLONE  15 MG/5ML solution Commonly known as: ORAPRED  Place 3 mLs (9 mg total) into feeding tube daily.   ZINC  OXIDE (  TOPICAL) EX Apply topically once daily as needed Rash around G-tube and perineal area      I spent 20 minutes caring for the patient today face to face reviewing records, including previous charts and test results, examination of the patient, discussion and education with the parent/caregiver about his condition, exchanging the g-tube, documentation in his chart, and developing a plan of care.  Ellouise Bollman NP-C [facility] [address], [address] [place], [place] [address] Ph. [phone] Fax [phone]

## 2024-11-12 ENCOUNTER — Encounter (INDEPENDENT_AMBULATORY_CARE_PROVIDER_SITE_OTHER): Payer: Self-pay | Admitting: Family

## 2024-11-12 ENCOUNTER — Telehealth (INDEPENDENT_AMBULATORY_CARE_PROVIDER_SITE_OTHER): Payer: Self-pay | Admitting: Pediatrics

## 2024-11-12 DIAGNOSIS — Z431 Encounter for attention to gastrostomy: Secondary | ICD-10-CM | POA: Insufficient documentation

## 2024-11-12 NOTE — Telephone Encounter (Signed)
 Called [facility]  to confirm they had received the overnight pulse oximetry study sent by Dr. Isaiah. They had received the order but were unable to contact mom as they did not have a valid phone number. I provided them with mom's updated phone number, and they said they would call her to schedule the study.

## 2024-11-12 NOTE — Telephone Encounter (Signed)
 I called Monica with [employer] to get an update on the authorization for Athony's nursing. She informed me that they heard back from [facility] and his hours were reinstated as of yesterday, 12/1.

## 2024-11-13 ENCOUNTER — Telehealth (INDEPENDENT_AMBULATORY_CARE_PROVIDER_SITE_OTHER): Payer: Self-pay | Admitting: Pediatrics

## 2024-11-13 NOTE — Telephone Encounter (Signed)
 Mom called back wanting an update, I informed her that Dr. Waddell will give her a call back as soon as she gets the chance too.

## 2024-11-13 NOTE — Telephone Encounter (Signed)
 Amazing!!  So glad it worked out.    Corean Geralds MD MPH

## 2024-11-13 NOTE — Telephone Encounter (Signed)
 This concern was addressed in a MyChart message.   I called mom to make sure that she was okay with communicating through MyChart messages. Mom verbalized that she was okay with this.   Mom stated that he was seen by his Respiratory provider on 10/25/2024 who stated that this tic was a neurological issue.   Mom also stated that this tic started about 7 months ago and is continuous, but it gets worse when he's at a doctors visit.   Mom does not believe the it is a respiratory concern.   SS, CCMA

## 2024-11-13 NOTE — Telephone Encounter (Signed)
  Name of who is calling: Randine Millard Relationship to Patient: Mom  Best contact number: [phone]  Provider they see: Waddell  Reason for call: Mom wants to talk to Dr Waddell about Rakeen's anxiety tic. She said the tic gets louder when he goes to all his dr appts. Mom said Dr. Waddell was able to hear this tic and she said it was respiratory. Mom said the respiratory Dr said it was a neurological issue. He is currently taking Hydroxydine for anxiety. Please reach out to mom about next steps.      PRESCRIPTION REFILL ONLY  Name of prescription:  Pharmacy:

## 2024-11-14 ENCOUNTER — Encounter (INDEPENDENT_AMBULATORY_CARE_PROVIDER_SITE_OTHER): Payer: Self-pay | Admitting: Pediatrics

## 2024-11-14 NOTE — Telephone Encounter (Signed)
 I contacted patients mother as requested.   Mom stated that the order was for Lucas [name] to wear the BiPAP at night. She wanted to know she she needed to call pulmonology and tell them that he should be wearing his BiPAP 24/7?   Ms. Lucas [name] doesn't believe that this is a respiratory issue and she doesn't understand why the pulmonary provider didn't catch this when he was seen with Pulmonary and/or while Lucas [name] was in the hospital.   Mom stated that she was told by Dr. Waddell that he is to wear his BiPAP machine 24/7. I informed mom that I did not see that instruction in Dr. Garnetta note, and that I would ask Dr. Waddell for clarification.   Mom stated that Lucas [name] is miserable with the mask on but she is wiling to take the mask off of him for a few hours a day to give him a break.   [facility] did contact mom to inform her of the referral being received. They did not provide mom with a timeframe as to when the pulse ox would be sent.   Mom would also like to know how long should Lucas [name] wear the PulseOx? 12 or 24 hours. She states that his O2 has not dropped past 93 since being home.   Mom stated that Lucas [name] seems to enjoy doing the tic. He smiles when he does it. Mom would like to know why does he enjoy doing this?   SS, CCMA

## 2024-11-14 NOTE — Telephone Encounter (Signed)
 I called mom back, confirmed this is the same sound we heard yesterday.  Mom says this has been going on since September.  I again explained I think this is related to breathing, not a tic.  But I reassured mom this isn't acute, I'm not concerned.    I reviewed that mom was concerned about his breathing yesterday and his breathing was shallow on my assessment, so I told her it was ok to put the CPAP on him if she is worried. Nurse reports he is not breathing shallow today, the sound is actually much better at home. In general and for the nurse, I recommend not changing the CPAP orders,  wear it only when he is asleep.    I confirmed with mom that I will talk to Pulmonologist so we can get on the same page on diagnosis of this sound.    She is concerned that his O2 sat goes down to sometimes 88-92.  This is brief and when she moves him or warms his fingers, he goes back up.  I reassured here there is no intervention for this.  She is worried the pulse ox is old (10 years) and causing this to happen.  I agreed to put in an order for a new pulse ox, but I do not think that is related.   Mother continues to be worried about anxiety, that his breathing becomes shallow at appointments because he is stressed.  I confirmed that it won't hurt him to give medication prescribed for him (hydroxyzine ).  It may not help. It may make him sleepy, if he falls asleep he needs to wear his CPAP.    Mom calmed down by the end of the call, nurse was able to reassure her she will help her with the medication and CPAP.    Corean Geralds MD MPH

## 2024-11-14 NOTE — Telephone Encounter (Signed)
 Questions addressed via mychart.   Corean Geralds MD MPH

## 2024-11-15 ENCOUNTER — Telehealth (INDEPENDENT_AMBULATORY_CARE_PROVIDER_SITE_OTHER): Payer: Self-pay

## 2024-11-15 NOTE — Telephone Encounter (Addendum)
 Received a call from patients mother.  Verified patients name and DOB as well as mothers name.   Jeziel's RN was present for this call and he had some questions about his feeding regimen. Mom was present during the entire call and gave verbal consent to speak with the RN while in her presence.   Antavion's RN advised that his current feeding regimen is as follows:     1 container (237 mL) @ 170 mL/hr x 3 feeds ( 11 AM, 3 PM, 7 PM) 1 & 1/2 container (355.5 mL) @ 170 mL/hr (7 PM) with a total volume being 829.5 mL's. Fasting for 16 hours.   The RN wanted to know if it would be okay to round up to 356 mL or down to 355 mL? Per mom and the RN Frances has been on this regimen for the last two years per mom's orders.   Mom stated that she changed the orders because she was afraid of 240 mL's being too much at once for him. She thinks 240 mL's would cause Shawnte to throw up and irritate his stomach.   I informed mom and RN that this would need to run this by the provider who isnt in the office today. I would also send to the assisting provider.   Mom and RN verbalized understanding of this. Stated that they will continue the order as is until they hear from the provider.   SS, CCMA

## 2024-11-18 ENCOUNTER — Telehealth (INDEPENDENT_AMBULATORY_CARE_PROVIDER_SITE_OTHER): Payer: Self-pay | Admitting: Pediatrics

## 2024-11-18 NOTE — Telephone Encounter (Signed)
 Contacted patients mother.  Verified patients name and DOB as well as mother's name.   I relayed the message from the provider to mom.   Mom stated that the nurse will be in tomorrow and would have them figure all of this out. Mom will call back with the nurse tomorrow.   Mom is unsure if new orders are needed at this time. She encourages us  to call [facility] to see.   Contacted [facility] who stated that the current order is good until August 2026 for the entire feeding kit (formula, bags, etc.)   SS, CCMA

## 2024-11-18 NOTE — Telephone Encounter (Signed)
 I was wondering if the nurse needs new care orders, as the feeding regimen has changed.  The supplies orders should be fine.    We can work on that after I confirm with the PCP on who will be writing orders moving forward   Corean Geralds MD MPH

## 2024-11-18 NOTE — Telephone Encounter (Signed)
 Please see call from 11/15/2024  Feeding Intolerance   SS, CCMA

## 2024-11-18 NOTE — Telephone Encounter (Signed)
  Name of who is calling: Dwayne Mayers   Caller's Relationship to Patient: Mom   Best contact number: [phone]  Provider they see: Dr Waddell   Reason for call: Mom called in stating that the weight of food going into Lucas [name] is 370 ml and that is incorrect. She stated the other time it was 170. She said he is not getting enough feed because it's supposed to be 3 1/2 cans. She is requesting  a call back.      PRESCRIPTION REFILL ONLY  Name of prescription:  Pharmacy:

## 2024-11-18 NOTE — Telephone Encounter (Signed)
 Contacted patients mother =.  Verified patients name and DOB as well as mothers name.   Mom wanted to know exactly how many Calories Dr. Waddell was aiming for per day with the new feeding regimen.   Mom and I walked through the math together, mom eventually understood his calorie intake. She mentioned that Thelmer's PCP recommended that he get 215 mL of formula per day. Mom expressed that she didn't want to give him too much at once because it may make him sick.   I explained to mom that Dr. Waddell plans to reach out to his PCP about that Khiry's feeding regimen. Encouraged mom to continue with Dr. Garnetta recommendations.   Per mom, Leron has been getting the 3.5 cans over the weekend, but she does not enjoy the hassle of giving the 1/2 can at 7 PM. Mom has been giving the 1/2 can earlier in the day instead of at night. I verbalized understanding. This is okay as long as he is receiving a total of 829.5 mL's a day.  Mom verbalized understanding and stated that she will wait to hear back about rounding the 7 PM feed up or down.  SS, CCMA

## 2024-11-18 NOTE — Telephone Encounter (Signed)
 I confirm that the plan is for 3.5 containers per day. They can round up for a total of per day. How they give it can be flexible.Please confirm if they need new orders.   I am working on talking to Bruna (the dietician), and the PCP so it is not as confusing moving forward    Corean Geralds MD MPH

## 2024-11-25 ENCOUNTER — Encounter (INDEPENDENT_AMBULATORY_CARE_PROVIDER_SITE_OTHER): Payer: Self-pay | Admitting: Pediatrics

## 2024-11-25 DIAGNOSIS — Z8701 Personal history of pneumonia (recurrent): Secondary | ICD-10-CM | POA: Insufficient documentation

## 2024-11-25 DIAGNOSIS — M2450 Contracture, unspecified joint: Secondary | ICD-10-CM | POA: Insufficient documentation

## 2024-11-29 ENCOUNTER — Telehealth (INDEPENDENT_AMBULATORY_CARE_PROVIDER_SITE_OTHER): Payer: Self-pay | Admitting: Pediatrics

## 2024-11-29 ENCOUNTER — Other Ambulatory Visit: Payer: Self-pay

## 2024-11-29 DIAGNOSIS — G4733 Obstructive sleep apnea (adult) (pediatric): Secondary | ICD-10-CM

## 2024-11-29 DIAGNOSIS — R0689 Other abnormalities of breathing: Secondary | ICD-10-CM

## 2024-11-29 DIAGNOSIS — G7100 Muscular dystrophy, unspecified: Secondary | ICD-10-CM

## 2024-11-29 DIAGNOSIS — J9611 Chronic respiratory failure with hypoxia: Secondary | ICD-10-CM

## 2024-11-29 DIAGNOSIS — G7101 Duchenne or Becker muscular dystrophy: Secondary | ICD-10-CM

## 2024-11-29 NOTE — Telephone Encounter (Signed)
 Contacted patients mother.  Verified patients name and DOB as well as mothers name.   Mom reiterated what was relayed to front desk. She also stated that they only sent her 4 of the sensors for this monitor and she will need more.  She also wanted to let Dr .Waddell know that the pulmonologist has prescribed 2 liters of oxygen  for Alfonso to use at night.   Contacted [facility] to see if they would be able to supply him with more sensors for the monitor. The representative stated that she would look into this request and call back.   SS, CCMA

## 2024-11-29 NOTE — Telephone Encounter (Signed)
 Reviewed ONO with Dr. Isaiah who determined the pt needs 2L of oxygen  at night. I have relayed this information to pt's mother who will update his nurse as well. Oxygen  order has been placed to [facility].

## 2024-11-29 NOTE — Telephone Encounter (Signed)
 Copied from CRM #[id number]. Topic: Clinical - Lab/Test Results >> Nov 29, 2024 11:53 AM Joesph PARAS wrote: Reason for CRM: Patient's nurse is calling demanding sleep study results. Patient's spouse is yelling in background and is very upset that there is not a note in the chart at this time about results. They state they sent back the pulse ox test five days ago. Please update patient with results when able.

## 2024-11-29 NOTE — Telephone Encounter (Signed)
"  °  Name of who is calling: Lucas [name] Relationship to Patient: Mom  Best contact number: [phone]  Provider they see: Waddell  Reason for call: Mom said they got the pulse ox machine and she's confused. She said it looks like they sent the one that stays on his finger forever and not the one you can take off. Please call mom at [phone] ( a temp # for right now)     PRESCRIPTION REFILL ONLY  Name of prescription:  Pharmacy:   "

## 2024-12-02 NOTE — Telephone Encounter (Signed)
 Copied from CRM #[id number]. Topic: Clinical - Medical Advice >> Dec 02, 2024 10:48 AM Essie A wrote: Reason for CRM: Ms. Nicholaus who is the patient's caregiver, called because they got a message saying that patient needs a sleep study done, but Ms. Nicholaus says he can't come into the office but will need a home sleep study done.  Please return her call at [phone] (this number belongs to patient's mom, Randine) so she can discuss the home sleep study.

## 2024-12-02 NOTE — Telephone Encounter (Signed)
 Holding message until we get further notice of if any more testing is needed per insurance.

## 2024-12-03 NOTE — Telephone Encounter (Signed)
 Zea it looks like [employer] is delivering the 02 today and no questions asked

## 2024-12-03 NOTE — Telephone Encounter (Signed)
 I received a message from Dolanda with [employer] RE: Patient needs 2 liters of 02 bled into Bipap based off ONO results Received: Today Tucker, Dolanda   I am so sorry this has been taken care of and scheduled for delivery today

## 2024-12-04 NOTE — Telephone Encounter (Signed)
 Noted, nfn

## 2024-12-09 ENCOUNTER — Other Ambulatory Visit (INDEPENDENT_AMBULATORY_CARE_PROVIDER_SITE_OTHER): Payer: Self-pay

## 2024-12-09 DIAGNOSIS — K5900 Constipation, unspecified: Secondary | ICD-10-CM

## 2024-12-09 MED ORDER — POLYETHYLENE GLYCOL 3350 17 GM/SCOOP PO POWD: ORAL | 3 refills | Status: AC

## 2024-12-09 NOTE — Telephone Encounter (Signed)
 Patient's mother gave permission to speak to [employer] LPN.

## 2024-12-09 NOTE — Telephone Encounter (Signed)
 Closing encounter. Spoke with Oralee, LPN who wanted to clarify about whether the patient needs to be wearing his oxygen  every time he sleeps or just at night. I clarified that he will need to have his BiPAP with 2L of O2 bled in for every time he sleeps, including daytime naps.   NFN.

## 2024-12-09 NOTE — Telephone Encounter (Signed)
 Copied from CRM [id number]. Topic: Clinical - Medical Advice >> Dec 09, 2024 11:04 AM Joesph PARAS wrote: Reason for CRM: Patient's LPN Is calling to request a nurse call and advise her on patient's oxygen  needs and additional questions about pulse ox, as mother of patient is not able to reiterate information. Please return call to LPN at c/b number listed in contact section.

## 2024-12-13 NOTE — Telephone Encounter (Signed)
 Copied from CRM [id number]. Topic: Clinical - Medical Advice >> Dec 11, 2024  2:08 PM Joesph PARAS wrote: Lucas [name] with [facility] is calling once again to request advisement on if patient needs to be on oxygen  at ALL times regardless of awake or asleep or JUST when sleeping. Please contact Lucas [name] [employer] back at [phone] to medically advise.   I tried calling the number provided and went straight to vm. Routing to [employer]

## 2024-12-13 NOTE — Telephone Encounter (Signed)
 Spoke with nurse who wanted to know if he needed constant oxygen  monitoring or not and said that no order has been given in that regard and will ask Dr. Isaiah but otherwise he just needs to have his oxygen  bled into his BiPAP whenever he is asleep. Nurse expressed understanding. NFN.

## 2024-12-24 ENCOUNTER — Telehealth: Payer: Self-pay

## 2024-12-24 DIAGNOSIS — J9611 Chronic respiratory failure with hypoxia: Secondary | ICD-10-CM

## 2024-12-24 DIAGNOSIS — R0689 Other abnormalities of breathing: Secondary | ICD-10-CM

## 2024-12-24 DIAGNOSIS — G7100 Muscular dystrophy, unspecified: Secondary | ICD-10-CM

## 2024-12-24 NOTE — Telephone Encounter (Signed)
 Copied from CRM [id number]. Topic: Clinical - Order For Equipment >> Dec 24, 2024  1:08 PM Rilla B wrote: Reason for CRM: Patient needs an order for a new CPT VEST MACHINE ([employer] is the brand) faxed over to [phone] ([facility]).    Questions please call mom Dwayne @ [phone].

## 2024-12-24 NOTE — Telephone Encounter (Signed)
 Ordered has been placed.   LMTCB. E2C2 please advise when patient calls back.

## 2024-12-24 NOTE — Telephone Encounter (Signed)
Okay to place the order? 

## 2025-01-03 ENCOUNTER — Telehealth (INDEPENDENT_AMBULATORY_CARE_PROVIDER_SITE_OTHER): Payer: Self-pay | Admitting: Pediatrics
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
## 2025-01-07 NOTE — Telephone Encounter (Signed)
 Copied from CRM #[id number]. Topic: General - Other >> Jan 06, 2025 11:41 AM Joesph PARAS wrote: Reason for CRM: Bernardino calling with [facility]. Received [place] [employer] Form. 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  States needs to have both diagnoses present on order form to also be listed on [place] Medicaid form (written and ICD versions).  #25 - No checkbox is selected. States needs to be filled out.  States need initials and date for every correction made.  Statement of Medical Necessity is a required document.  States is the second device for patient and statement of what happened to first device is also required.  States first required document will be faxed over, second can be a letter on own letterhead.  All documents need to be signed and dated by provider.  For questions, please return call to [place] at number listed in contacts of CRM.

## 2025-01-07 NOTE — Telephone Encounter (Signed)
 Letter of Medical Necessity has been created and added to the pile of documents to be faxed out. Dr. Isaiah has reviewed form and filled in any missing information.

## 2025-02-17 ENCOUNTER — Ambulatory Visit (INDEPENDENT_AMBULATORY_CARE_PROVIDER_SITE_OTHER): Payer: Self-pay | Admitting: Pediatrics

## 2025-03-24 ENCOUNTER — Ambulatory Visit: Admitting: Sleep Medicine
